# Patient Record
Sex: Female | Born: 1955 | Race: Black or African American | Hispanic: No | Marital: Married | Smoking: Never smoker
Health system: Southern US, Community
[De-identification: ages and names within clinical notes are randomized; demographics above are authoritative.]

## PROBLEM LIST (undated history)

## (undated) ENCOUNTER — Emergency Department (HOSPITAL_COMMUNITY): Admission: EM | Discharge status: Absent without leave | Payer: Self-pay

## (undated) DIAGNOSIS — F419 Anxiety disorder, unspecified: Secondary | ICD-10-CM

## (undated) DIAGNOSIS — E663 Overweight: Secondary | ICD-10-CM

## (undated) DIAGNOSIS — T7840XA Allergy, unspecified, initial encounter: Secondary | ICD-10-CM

## (undated) DIAGNOSIS — G4733 Obstructive sleep apnea (adult) (pediatric): Secondary | ICD-10-CM

## (undated) DIAGNOSIS — I10 Essential (primary) hypertension: Secondary | ICD-10-CM

## (undated) DIAGNOSIS — R0602 Shortness of breath: Secondary | ICD-10-CM

## (undated) DIAGNOSIS — E669 Obesity, unspecified: Secondary | ICD-10-CM

## (undated) DIAGNOSIS — G4730 Sleep apnea, unspecified: Secondary | ICD-10-CM

## (undated) DIAGNOSIS — R7303 Prediabetes: Secondary | ICD-10-CM

## (undated) DIAGNOSIS — E785 Hyperlipidemia, unspecified: Secondary | ICD-10-CM

## (undated) DIAGNOSIS — D649 Anemia, unspecified: Secondary | ICD-10-CM

## (undated) DIAGNOSIS — B070 Plantar wart: Secondary | ICD-10-CM

## (undated) HISTORY — DX: Sleep apnea, unspecified: G47.30

## (undated) HISTORY — PX: TUBAL LIGATION: SHX77

## (undated) HISTORY — DX: Essential (primary) hypertension: I10

## (undated) HISTORY — DX: Obstructive sleep apnea (adult) (pediatric): G47.33

## (undated) HISTORY — DX: Anemia, unspecified: D64.9

## (undated) HISTORY — DX: Obesity, unspecified: E66.9

## (undated) HISTORY — DX: Overweight: E66.3

## (undated) HISTORY — DX: Hyperlipidemia, unspecified: E78.5

## (undated) HISTORY — DX: Plantar wart: B07.0

## (undated) HISTORY — DX: Allergy, unspecified, initial encounter: T78.40XA

## (undated) HISTORY — DX: Prediabetes: R73.03

## (undated) HISTORY — DX: Shortness of breath: R06.02

## (undated) HISTORY — DX: Anxiety disorder, unspecified: F41.9

---

## 2000-07-11 ENCOUNTER — Encounter: Payer: Self-pay | Admitting: Pulmonary Disease

## 2000-07-11 ENCOUNTER — Ambulatory Visit (HOSPITAL_COMMUNITY): Admission: RE | Admit: 2000-07-11 | Discharge: 2000-07-11 | Payer: Self-pay | Admitting: Pulmonary Disease

## 2000-07-27 ENCOUNTER — Encounter: Admission: RE | Admit: 2000-07-27 | Discharge: 2000-08-18 | Payer: Self-pay | Admitting: Sports Medicine

## 2001-11-05 ENCOUNTER — Other Ambulatory Visit: Admission: RE | Admit: 2001-11-05 | Discharge: 2001-11-05 | Payer: Self-pay | Admitting: Gynecology

## 2003-12-25 ENCOUNTER — Other Ambulatory Visit: Admission: RE | Admit: 2003-12-25 | Discharge: 2003-12-25 | Payer: Self-pay | Admitting: Gynecology

## 2004-01-15 ENCOUNTER — Ambulatory Visit: Payer: Self-pay | Admitting: Pulmonary Disease

## 2004-06-22 ENCOUNTER — Emergency Department (HOSPITAL_COMMUNITY): Admission: EM | Admit: 2004-06-22 | Discharge: 2004-06-22 | Payer: Self-pay | Admitting: Emergency Medicine

## 2004-07-21 ENCOUNTER — Encounter: Admission: RE | Admit: 2004-07-21 | Discharge: 2004-08-11 | Payer: Self-pay | Admitting: Orthopedic Surgery

## 2005-01-27 ENCOUNTER — Other Ambulatory Visit: Admission: RE | Admit: 2005-01-27 | Discharge: 2005-01-27 | Payer: Self-pay | Admitting: Gynecology

## 2005-02-18 ENCOUNTER — Ambulatory Visit: Payer: Self-pay | Admitting: Pulmonary Disease

## 2006-06-09 ENCOUNTER — Emergency Department (HOSPITAL_COMMUNITY): Admission: EM | Admit: 2006-06-09 | Discharge: 2006-06-09 | Payer: Self-pay | Admitting: Emergency Medicine

## 2007-01-11 DIAGNOSIS — J4540 Moderate persistent asthma, uncomplicated: Secondary | ICD-10-CM | POA: Insufficient documentation

## 2007-01-11 DIAGNOSIS — J45909 Unspecified asthma, uncomplicated: Secondary | ICD-10-CM

## 2007-01-12 ENCOUNTER — Ambulatory Visit: Payer: Self-pay | Admitting: Pulmonary Disease

## 2007-01-12 LAB — CONVERTED CEMR LAB
ALT: 36 units/L — ABNORMAL HIGH (ref 0–35)
Albumin: 3.7 g/dL (ref 3.5–5.2)
Basophils Absolute: 0 10*3/uL (ref 0.0–0.1)
Bilirubin Urine: NEGATIVE
Bilirubin, Direct: 0.2 mg/dL (ref 0.0–0.3)
Calcium: 9.1 mg/dL (ref 8.4–10.5)
Chloride: 102 meq/L (ref 96–112)
Cholesterol: 178 mg/dL (ref 0–200)
Eosinophils Absolute: 0.2 10*3/uL (ref 0.0–0.6)
Eosinophils Relative: 3.2 % (ref 0.0–5.0)
GFR calc Af Amer: 98 mL/min
GFR calc non Af Amer: 81 mL/min
Glucose, Bld: 94 mg/dL (ref 70–99)
HDL: 58.2 mg/dL (ref >39.0)
Hemoglobin, Urine: NEGATIVE
Ketones, ur: NEGATIVE mg/dL
LDL Cholesterol: 113 mg/dL — ABNORMAL HIGH (ref 0–99)
Lymphocytes Relative: 28.4 % (ref 12.0–46.0)
MCHC: 34.1 g/dL (ref 30.0–36.0)
MCV: 87.7 fL (ref 78.0–100.0)
Monocytes Relative: 6.5 % (ref 3.0–11.0)
Neutro Abs: 4.7 10*3/uL (ref 1.4–7.7)
Nitrite: NEGATIVE
Platelets: 287 10*3/uL (ref 150–400)
RBC: 4.61 M/uL (ref 3.87–5.11)
TSH: 1.9 microintl units/mL (ref 0.35–5.50)
Total CHOL/HDL Ratio: 3.1
Triglycerides: 35 mg/dL (ref 0–149)
Urine Glucose: NEGATIVE mg/dL
WBC: 7.5 10*3/uL (ref 4.5–10.5)

## 2007-04-03 ENCOUNTER — Ambulatory Visit: Payer: Self-pay | Admitting: Pulmonary Disease

## 2007-04-10 ENCOUNTER — Telehealth (INDEPENDENT_AMBULATORY_CARE_PROVIDER_SITE_OTHER): Payer: Self-pay | Admitting: *Deleted

## 2007-06-14 ENCOUNTER — Other Ambulatory Visit: Admission: RE | Admit: 2007-06-14 | Discharge: 2007-06-14 | Payer: Self-pay | Admitting: Gynecology

## 2007-06-22 ENCOUNTER — Telehealth: Payer: Self-pay | Admitting: Pulmonary Disease

## 2007-06-27 ENCOUNTER — Telehealth (INDEPENDENT_AMBULATORY_CARE_PROVIDER_SITE_OTHER): Payer: Self-pay | Admitting: *Deleted

## 2007-07-27 ENCOUNTER — Ambulatory Visit: Payer: Self-pay | Admitting: Pulmonary Disease

## 2007-07-27 DIAGNOSIS — J309 Allergic rhinitis, unspecified: Secondary | ICD-10-CM | POA: Insufficient documentation

## 2007-07-27 DIAGNOSIS — E785 Hyperlipidemia, unspecified: Secondary | ICD-10-CM | POA: Insufficient documentation

## 2007-07-27 DIAGNOSIS — F411 Generalized anxiety disorder: Secondary | ICD-10-CM | POA: Insufficient documentation

## 2007-09-21 ENCOUNTER — Encounter: Payer: Self-pay | Admitting: Pulmonary Disease

## 2007-10-07 ENCOUNTER — Emergency Department (HOSPITAL_COMMUNITY): Admission: EM | Admit: 2007-10-07 | Discharge: 2007-10-07 | Payer: Self-pay | Admitting: Emergency Medicine

## 2007-11-22 ENCOUNTER — Encounter: Payer: Self-pay | Admitting: Pulmonary Disease

## 2007-11-23 ENCOUNTER — Telehealth: Payer: Self-pay | Admitting: Pulmonary Disease

## 2007-11-27 ENCOUNTER — Telehealth (INDEPENDENT_AMBULATORY_CARE_PROVIDER_SITE_OTHER): Payer: Self-pay | Admitting: *Deleted

## 2007-11-27 ENCOUNTER — Encounter: Payer: Self-pay | Admitting: Pulmonary Disease

## 2007-12-03 ENCOUNTER — Telehealth (INDEPENDENT_AMBULATORY_CARE_PROVIDER_SITE_OTHER): Payer: Self-pay | Admitting: *Deleted

## 2007-12-07 ENCOUNTER — Telehealth (INDEPENDENT_AMBULATORY_CARE_PROVIDER_SITE_OTHER): Payer: Self-pay | Admitting: *Deleted

## 2007-12-07 ENCOUNTER — Telehealth: Payer: Self-pay | Admitting: Pulmonary Disease

## 2007-12-10 ENCOUNTER — Encounter: Payer: Self-pay | Admitting: Pulmonary Disease

## 2008-01-28 ENCOUNTER — Emergency Department (HOSPITAL_COMMUNITY): Admission: EM | Admit: 2008-01-28 | Discharge: 2008-01-28 | Payer: Self-pay | Admitting: Emergency Medicine

## 2008-02-06 ENCOUNTER — Telehealth (INDEPENDENT_AMBULATORY_CARE_PROVIDER_SITE_OTHER): Payer: Self-pay | Admitting: *Deleted

## 2008-02-07 ENCOUNTER — Ambulatory Visit: Payer: Self-pay | Admitting: Pulmonary Disease

## 2008-02-08 HISTORY — PX: COLONOSCOPY: SHX174

## 2008-02-12 LAB — CONVERTED CEMR LAB
Basophils Relative: 0.1 % (ref 0.0–3.0)
HCT: 39.1 % (ref 36.0–46.0)
Hemoglobin: 13.4 g/dL (ref 12.0–15.0)
Lymphocytes Relative: 13.9 % (ref 12.0–46.0)
Monocytes Absolute: 0.4 10*3/uL (ref 0.1–1.0)
Monocytes Relative: 3.3 % (ref 3.0–12.0)
Neutro Abs: 10.3 10*3/uL — ABNORMAL HIGH (ref 1.4–7.7)
RBC: 4.48 M/uL (ref 3.87–5.11)

## 2008-02-15 ENCOUNTER — Ambulatory Visit: Payer: Self-pay | Admitting: Pulmonary Disease

## 2008-02-22 ENCOUNTER — Ambulatory Visit: Payer: Self-pay | Admitting: Pulmonary Disease

## 2008-02-22 LAB — CONVERTED CEMR LAB
Fecal Occult Blood: NEGATIVE
OCCULT 4: POSITIVE — AB
OCCULT 5: NEGATIVE

## 2008-02-27 ENCOUNTER — Encounter: Admission: RE | Admit: 2008-02-27 | Discharge: 2008-05-27 | Payer: Self-pay | Admitting: Occupational Medicine

## 2008-04-30 ENCOUNTER — Ambulatory Visit (HOSPITAL_COMMUNITY): Admission: RE | Admit: 2008-04-30 | Discharge: 2008-04-30 | Payer: Self-pay | Admitting: Internal Medicine

## 2008-05-28 ENCOUNTER — Encounter: Payer: Self-pay | Admitting: Pulmonary Disease

## 2008-06-06 ENCOUNTER — Encounter: Payer: Self-pay | Admitting: Pulmonary Disease

## 2008-08-15 ENCOUNTER — Ambulatory Visit: Payer: Self-pay | Admitting: Gastroenterology

## 2008-08-19 ENCOUNTER — Ambulatory Visit: Payer: Self-pay | Admitting: Gastroenterology

## 2008-08-19 ENCOUNTER — Telehealth: Payer: Self-pay | Admitting: Gastroenterology

## 2008-08-19 LAB — CONVERTED CEMR LAB
Basophils Relative: 0.1 % (ref 0.0–3.0)
Eosinophils Absolute: 0.2 10*3/uL (ref 0.0–0.7)
Eosinophils Relative: 2.1 % (ref 0.0–5.0)
Lymphocytes Relative: 24.9 % (ref 12.0–46.0)
Monocytes Relative: 3.8 % (ref 3.0–12.0)
Neutrophils Relative %: 69.1 % (ref 43.0–77.0)
RBC: 4.55 M/uL (ref 3.87–5.11)
WBC: 7.5 10*3/uL (ref 4.5–10.5)

## 2008-11-28 ENCOUNTER — Encounter: Payer: Self-pay | Admitting: Pulmonary Disease

## 2008-12-11 ENCOUNTER — Ambulatory Visit: Payer: Self-pay | Admitting: Pulmonary Disease

## 2009-01-27 ENCOUNTER — Telehealth (INDEPENDENT_AMBULATORY_CARE_PROVIDER_SITE_OTHER): Payer: Self-pay | Admitting: *Deleted

## 2009-02-02 LAB — CONVERTED CEMR LAB
ALT: 14 units/L (ref 0–35)
AST: 18 units/L (ref 0–37)
Albumin: 3.8 g/dL (ref 3.5–5.2)
CO2: 29 meq/L (ref 19–32)
Eosinophils Relative: 1.8 % (ref 0.0–5.0)
HCT: 42.3 % (ref 36.0–46.0)
HDL: 57.1 mg/dL (ref >39.00)
Hemoglobin: 14.3 g/dL (ref 12.0–15.0)
LDL Cholesterol: 102 mg/dL — ABNORMAL HIGH (ref 0–99)
Lymphs Abs: 2.1 10*3/uL (ref 0.7–4.0)
MCV: 90 fL (ref 78.0–100.0)
Monocytes Absolute: 0.4 10*3/uL (ref 0.1–1.0)
Monocytes Relative: 5.7 % (ref 3.0–12.0)
Neutro Abs: 3.7 10*3/uL (ref 1.4–7.7)
Platelets: 260 10*3/uL (ref 150.0–400.0)
RDW: 13.2 % (ref 11.5–14.6)
TSH: 1.67 microintl units/mL (ref 0.35–5.50)
Total Bilirubin: 0.9 mg/dL (ref 0.3–1.2)
Total CHOL/HDL Ratio: 3
Triglycerides: 45 mg/dL (ref 0.0–149.0)
VLDL: 9 mg/dL (ref 0.0–40.0)
WBC: 6.4 10*3/uL (ref 4.5–10.5)

## 2009-02-11 ENCOUNTER — Telehealth: Payer: Self-pay | Admitting: Pulmonary Disease

## 2009-05-21 ENCOUNTER — Ambulatory Visit: Payer: Self-pay | Admitting: Pulmonary Disease

## 2009-05-21 DIAGNOSIS — I10 Essential (primary) hypertension: Secondary | ICD-10-CM | POA: Insufficient documentation

## 2009-05-21 LAB — CONVERTED CEMR LAB
CO2: 29 meq/L (ref 19–32)
Calcium: 8.7 mg/dL (ref 8.4–10.5)
Creatinine, Ser: 0.6 mg/dL (ref 0.4–1.2)
GFR calc non Af Amer: 134.75 mL/min (ref >60)
Sodium: 142 meq/L (ref 135–145)

## 2009-06-04 ENCOUNTER — Ambulatory Visit: Payer: Self-pay | Admitting: Pulmonary Disease

## 2009-11-27 ENCOUNTER — Encounter: Payer: Self-pay | Admitting: Pulmonary Disease

## 2009-11-27 ENCOUNTER — Ambulatory Visit: Payer: Self-pay | Admitting: Pulmonary Disease

## 2009-11-29 LAB — CONVERTED CEMR LAB
ALT: 14 units/L (ref 0–35)
AST: 19 units/L (ref 0–37)
Alkaline Phosphatase: 99 units/L (ref 39–117)
BUN: 11 mg/dL (ref 6–23)
Bilirubin, Direct: 0.1 mg/dL (ref 0.0–0.3)
Chloride: 105 meq/L (ref 96–112)
Cholesterol: 185 mg/dL (ref 0–200)
Eosinophils Absolute: 0.2 10*3/uL (ref 0.0–0.7)
Eosinophils Relative: 2.5 % (ref 0.0–5.0)
GFR calc non Af Amer: 110.74 mL/min (ref >60)
HCT: 43.7 % (ref 36.0–46.0)
LDL Cholesterol: 118 mg/dL — ABNORMAL HIGH (ref 0–99)
Lymphs Abs: 2.3 10*3/uL (ref 0.7–4.0)
MCHC: 33.3 g/dL (ref 30.0–36.0)
MCV: 87.8 fL (ref 78.0–100.0)
Monocytes Absolute: 0.5 10*3/uL (ref 0.1–1.0)
Neutrophils Relative %: 61.5 % (ref 43.0–77.0)
Platelets: 292 10*3/uL (ref 150.0–400.0)
Potassium: 4.2 meq/L (ref 3.5–5.1)
Sodium: 141 meq/L (ref 135–145)
Total Bilirubin: 0.7 mg/dL (ref 0.3–1.2)
Total CHOL/HDL Ratio: 3
Vitamin B-12: 1398 pg/mL — ABNORMAL HIGH (ref 211–911)
WBC: 7.9 10*3/uL (ref 4.5–10.5)

## 2009-11-30 ENCOUNTER — Encounter: Payer: Self-pay | Admitting: Pulmonary Disease

## 2009-12-09 ENCOUNTER — Telehealth: Payer: Self-pay | Admitting: Pulmonary Disease

## 2010-03-05 ENCOUNTER — Ambulatory Visit
Admission: RE | Admit: 2010-03-05 | Discharge: 2010-03-05 | Payer: Self-pay | Source: Home / Self Care | Attending: Pulmonary Disease | Admitting: Pulmonary Disease

## 2010-03-09 NOTE — Procedures (Signed)
Summary: Colonoscopy   Colonoscopy  Procedure date:  08/19/2008  Findings:      Location:  [facility].    Procedures Next Due Date:    Colonoscopy: 08/2018 COLONOSCOPY PROCEDURE REPORT  PATIENT:  Brenda Russo, Brenda Russo  MR#:  [id number] BIRTHDATE:   Feb 17, 1955, 52 yrs. old   GENDER:   female  ENDOSCOPIST:   Rachael Fee, MD Referred by: Alroy Dust, M.D.  PROCEDURE DATE:  08/19/2008 PROCEDURE:  Colonoscopy, diagnostic ASA CLASS:   Class II Indications: recent mild rectal bleeding, otherwise routine risk for colon cancer    MEDICATIONS:    Fentanyl 100 mcg IV, Versed 10 mg IV  DESCRIPTION OF PROCEDURE:   After the risks benefits and alternatives of the procedure were thoroughly explained, informed consent was obtained.  Digital rectal exam was performed and revealed no rectal masses.   The LB [id number] endoscope was introduced through the anus and advanced to the cecum, which was identified by both the appendix and ileocecal valve, without limitations.  The quality of the prep was excellent, using MoviPrep.  The instrument was then slowly withdrawn as the colon was fully examined. <<PROCEDUREIMAGES>>    <<OLD IMAGES>> FINDINGS:  A normal appearing cecum, ileocecal valve, and appendiceal orifice were identified. The ascending, hepatic flexure, transverse, splenic flexure, descending, sigmoid colon, and rectum appeared unremarkable (see image1, image2, and image3).   Retroflexed views in the rectum revealed no abnormalities.    The scope was then withdrawn from the patient and the procedure completed. COMPLICATIONS:   None  ENDOSCOPIC IMPRESSION:  1) Normal colon  2) No polyps or cancers RECOMMENDATIONS:  1) You should continue follow current colorectal cancer screening guidelines for "routine risk" patients with a repeat colonoscopy in 10 years. I do not recommend other colon cancer screening prior to then (including stool tests for microscopic blood)  unless new symptoms arise.     REPEAT EXAM:   10 years   _______________________________ Rachael Fee, MD

## 2010-03-09 NOTE — Assessment & Plan Note (Signed)
Summary: PHYSICAL ///KP   Primary Care Provider:  Alroy Dust  CC:  1 year ROV & CPX....  History of Present Illness: 55 y/o BF here for a follow up visit...  he has multiple medical problems as noted below...     ~  December 11, 2008:  states her breathing has been stable w/ Prn Albut neb & Ventolin HFA... no asthma exac etc... she wants to discuss wt loss programs and asks about HCG diet plan suggested by her GYN... we discussed diet programs in general- calories in, calories out, & gimmicks... she will consider weight watchers & exercise program to get in shape...   ~  November 27, 2009:  she had some transient HBP last spring- saw TP & started Benicar but she doesn't want to have to take meds & stopped on her own... checking BP's at home & states they are OK w/ high 140/90 range- unfortunately BP 170/100+ today in office & she clearly needs BP meds- we discussed this & she has agreed to take generic Hyzaar... she denies CP, palpit, SOB, edema, etc... states breathing is good & still only using the Prn Albuterol inhaler (even though she uses it pretty much every day)- still declines to use controller med like Symbicort (higher co-pay)... CXR is WNL, and Labs are norm, she requested B12 level= 1398... wants letter exempting her from Flu shot due to allergy to eggs.    Current Problem List:  ALLERGIC RHINITIS (ICD-477.9) - she uses OTC meds as needed.  ASTHMA (ICD-493.90) - only using Ventolin HFA as needed (she has NEB w/ Albuterol as well)... she hasn't been checking peak flows etc... she notes that she averages one inhalation daily- usually in the AM... she understands that she is not on a controller drug, but she does not wish to change to Symbicort, Advair, Pulmicort, etc (despite my recommendation to start and stay on one of these medications)...   ESSENTIAL HYPERTENSION (ICD-401.9) - we discussed starting LOSARTAN/Hct 100-12.5 daily... BP today = 170/100 on diet alone & we discussed no  salt & weight reduction... denies HA, fatigue, visual changes, CP, palipit, dizziness, syncope, dyspnea, edema, etc...   HYPERCHOLESTEROLEMIA, BORDERLINE (ICD-272.4) - on diet Rx alone...   ~  FLP 12/08 showed TChol 178, TG 35, HDL 58, LDL 113  ~  FLP 11/10 showed TChol 168, TG 45, HDL 57, LDL 102  ~  FLP 10/11 showed TChol 185, TG 49, HDL 57, LDL 118  OVERWEIGHT (ICD-278.02) - we discussed diet + exercise program...  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 had colonoscopy 7/10 by DrJacobs= neg, no polyps etc... f/u plannned 48yrs.   Preventive Screening-Counseling & Management  Alcohol-Tobacco     Smoking Status: never  Allergies (verified): No Known Drug Allergies  Comments:  Nurse/Medical Assistant: The patient's medications and allergies were reviewed with the patient and were updated in the Medication and Allergy Lists.  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 brother deceased age 45 with pancreatic cancer  1 sibling alive age 31 breast cancer in aunt mother alive age 35 hx of heart disease father deceased age 12 from heart failure-on vent hx of asthma 1 brother deceased age 28 from MVA  Social History: Reviewed history from 12/11/2008 and no changes required. 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  The patient denies fever, chills, sweats, anorexia, fatigue, weakness, malaise, weight loss, sleep disorder, blurring, diplopia, eye irritation, eye discharge, vision loss, eye pain, photophobia, earache, ear discharge, tinnitus, decreased hearing, nosebleeds, sore throat, hoarseness, chest pain, palpitations, syncope, orthopnea, PND, peripheral edema, cough, dyspnea at rest, excessive sputum, hemoptysis, wheezing, pleurisy, nausea, vomiting, diarrhea, constipation, change in bowel habits, abdominal pain, melena, hematochezia, jaundice, gas/bloating, indigestion/heartburn, dysphagia, odynophagia, dysuria, hematuria, urinary frequency, urinary hesitancy, nocturia, incontinence, back pain, joint pain, joint swelling, muscle cramps, muscle weakness, stiffness, arthritis, sciatica, restless legs, leg pain at night, leg pain with exertion, rash, itching, dryness, suspicious lesions, paralysis, paresthesias, seizures, tremors, vertigo, transient blindness, frequent falls, frequent headaches, difficulty walking, depression, memory loss, confusion, cold intolerance, heat intolerance, polydipsia, polyphagia, polyuria, unusual weight change, abnormal bruising, bleeding, enlarged lymph nodes, urticaria, allergic rash, and recurrent infections.    Vital Signs:  Patient profile:   54 year old female Height:      65 inches Weight:      213.13 pounds BMI:     35.59 O2 Sat:      98 % on Room air Temp:     97.8 degrees F oral Pulse rate:   83 / minute BP sitting:   170 / 100  (left arm) Cuff size:   large  Vitals Entered By: Randell Loop CMA (November 27, 2009 11:29 AM)  O2 Sat at Rest %:  98 O2 Flow:  Room air CC: 1 year ROV & CPX... Is Patient Diabetic? No Pain  Assessment Patient in pain? no      Comments meds updated today with pt   Physical Exam  Additional Exam:  WD, WN, 54 y/o BF in NAD... GENERAL:  Alert & oriented; pleasant & cooperative... HEENT:  [place]/AT, EOM-wnl, PERRLA, EACs-clear, TMs-wnl, NOSE-clear, THROAT-clear & wnl. NECK:  Supple w/ full ROM; no JVD; normal carotid impulses w/o bruits; no thyromegaly or nodules palpated; no lymphadenopathy. CHEST:  Clear to P & A; without wheezes/ rales/ or rhonchi heard... HEART:  Regular Rhythm; without murmurs/ rubs/ or gallops detected... ABDOMEN:  Soft & nontender; normal bowel sounds; no organomegaly or masses palpated... EXT: without deformities or arthritic changes; no varicose veins/ +venous insuffic/ no edema. NEURO:  CN's intact; motor testing normal; sensory testing normal; gait normal & balance OK. DERM:  No lesions noted; no rash etc...    CXR  Procedure date:  11/27/2009  Findings:      CHEST - 2 VIEW Comparison: 01/12/2007   FINDINGS:  The lungs are clear bilaterally.  No confluent airspace opacities, pleural effuions or pneumothoracies are seen.  The heart is normal in size and contour.  The upper abdomen and osseous structures are normal.   IMPRESSION: No acute cardiopulmonary disease.   Read By:  Henrene Pastor,  M.D.   EKG  Procedure date:  11/27/2009  Findings:      Sinus bradycardia with rate of:  56/ min... Tracing shows NSSTTWA, NAD...  SN   MISC. Report  Procedure date:  11/27/2009  Findings:      BMP (METABOL)   Sodium                    141 mEq/L                   135-145   Potassium                 4.2 mEq/L                   3.5-5.1   Chloride                  105 mEq/L                   96-112   Carbon Dioxide            29 mEq/L                    19-32   Glucose                   89 mg/dL                    16-10   BUN                       11 mg/dL                    9-60   Creatinine                0.7 mg/dL                    4.5-4.0   Calcium                   9.1 mg/dL                   9.8-11.9   GFR                       110.74 mL/min               >60  Hepatic/Liver Function Panel (HEPATIC)   Total Bilirubin           0.7 mg/dL                   1.4-7.8   Direct Bilirubin          0.1 mg/dL                   2.9-5.6   Alkaline Phosphatase      99 U/L                      39-117   AST                       19 U/L                      0-37   ALT  14 U/L                      0-35   Total Protein             7.4 g/dL                    1.1-9.1   Albumin                   3.7 g/dL                    4.7-8.2  CBC Platelet w/Diff (CBCD)   White Cell Count          7.9 K/uL                    4.5-10.5   Red Cell Count            4.97 Mil/uL                 3.87-5.11   Hemoglobin                14.6 g/dL                   95.6-21.3   Hematocrit                43.7 %                      36.0-46.0   MCV                       87.8 fl                     78.0-100.0   Platelet Count            292.0 K/uL                  150.0-400.0   Neutrophil %              61.5 %                      43.0-77.0   Lymphocyte %              29.0 %                      12.0-46.0   Monocyte %                6.7 %                       3.0-12.0   Eosinophils%              2.5 %                       0.0-5.0   Basophils %               0.3 %                       0.0-3.0  Comments:      Lipid Panel (LIPID)   Cholesterol               185 mg/dL  0-200   Triglycerides             49.0 mg/dL                  1.6-109.6   HDL                       04.54 mg/dL                 >09.81   LDL Cholesterol      [H]  191 mg/dL                   4-78   TSH (TSH)   FastTSH                   1.45 uIU/mL                 0.35-5.50   B12 Serum - Total ONLY (B12)   Vitamin B12          [H]  1398 pg/mL                  211-911    Impression & Recommendations:  Problem # 1:  PHYSICAL EXAMINATION  (ICD-V70.0) He 2 main issues are her BP and her Airway dis> see discussions below... Orders: EKG w/ Interpretation (93000) T-2 View CXR (71020TC) TLB-BMP (Basic Metabolic Panel-BMET) (80048-METABOL) TLB-Hepatic/Liver Function Pnl (80076-HEPATIC) TLB-CBC Platelet - w/Differential (85025-CBCD) TLB-Lipid Panel (80061-LIPID) TLB-TSH (Thyroid Stimulating Hormone) (84443-TSH) TLB-B12, Serum-Total ONLY (29562-Z30)  Problem # 2:  ASTHMA (ICD-493.90) She tells me that she is using the Albut inhaler every day & uses the NEBs once per month... I explained how asthmatics shouldn't have to use the rescue inhaler but once or twice per month!  The obvious solution would be to switch to Symbicort for daily use & the Ventolin back to just as needed but she declines due to cost, etc...  Her updated medication list for this problem includes:    Albuterol Sulfate (2.5 Mg/68ml) 0.083% Nebu (Albuterol sulfate) ..... Use in nebulizer up to 3 times daily as needed    Ventolin Hfa 108 (90 Base) Mcg/act Aers (Albuterol sulfate) .Marland Kitchen... 1-2 puffs q 4 h as needed for wheezing...  Problem # 3:  ESSENTIAL HYPERTENSION (ICD-401.9) We discussed her BP & the need for good control throughout the days... rec starting Hyzaar 100-12.5 daily, monitor BP at home & f/u here in 3-4 months... The following medications were removed from the medication list:    Benicar 40 Mg Tabs (Olmesartan medoxomil) .Marland Kitchen... 1 by mouth once daily Her updated medication list for this problem includes:    Losartan Potassium-hctz 100-12.5 Mg Tabs (Losartan potassium-hctz) .Marland Kitchen... Take 1 tab by mouth once daily...  Problem # 4:  HYPERCHOLESTEROLEMIA, BORDERLINE (ICD-272.4) Her LDL is 118 but she is still overwt & hasn't lost anything yet... discussed diet, exercise, get wt down...  Problem # 5:  OTHER MEDICAL ISSUES AS NOTED>>> She states that she is allergic to eggs & can't get the Flu vaccine...  Complete Medication List: 1)  Albuterol Sulfate (2.5  Mg/60ml) 0.083% Nebu (Albuterol sulfate) .... Use in nebulizer up to 3 times daily as needed 2)  Ventolin Hfa 108 (90 Base) Mcg/act Aers (Albuterol sulfate) .Marland Kitchen.. 1-2 puffs q 4 h as needed for wheezing... 3)  Losartan Potassium-hctz 100-12.5 Mg Tabs (Losartan potassium-hctz) .... Take 1 tab by mouth once daily...  Patient Instructions: 1)  Today we updated your med list-  see below.... 2)  We refilled your Ventolin HFA & wrote a new perscription for Losartan/Hct for your BP.Marland KitchenMarland Kitchen 3)  Today we did your follow up CXR, EKG, and FASTING blood work... please call the "phone tree" in a few days for your lab results.Marland KitchenMarland Kitchen 4)  Let's get on track w/ our diet & exercise program... the goal is to lose 15-20 lbs over the next several months... 5)  Call for any problems.Marland KitchenMarland Kitchen 6)  Let's plan a follow up visit in 3-4 months to recheck your BP & monitor your progress... Prescriptions: LOSARTAN POTASSIUM-HCTZ 100-12.5 MG TABS (LOSARTAN POTASSIUM-HCTZ) take 1 tab by mouth once daily...  #90 x 4   Entered and Authorized by:   Michele Mcalpine MD   Signed by:   Michele Mcalpine MD on 11/27/2009   Method used:   Print then Give to Patient   RxID:   [id number] VENTOLIN HFA 108 (90 BASE) MCG/ACT  AERS (ALBUTEROL SULFATE) 1-2 puffs Q 4 H as needed for wheezing...  #3 inhalers x 4   Entered and Authorized by:   Michele Mcalpine MD   Signed by:   Michele Mcalpine MD on 11/27/2009   Method used:   Print then Give to Patient   RxID:   [id number]

## 2010-03-09 NOTE — Letter (Signed)
Summary: [facility]  [address]   [place], [place] [address]   Phone: [phone]  Fax: [phone]    11/27/2009     To Whom It May Concern ,    Brenda Russo is a patient of mine that is followed for general medical purposes.  She has an allergy to eggs and is not able to take the flu vaccine due to this allergy.   If you have any further questions please feel free to contact my office.      Sincerely,       Lonzo Cloud. Elayne Snare. D.

## 2010-03-09 NOTE — Progress Notes (Signed)
Summary: prescript  Phone Note Call from Patient   Caller: Patient Call For: Sherika Kubicki Summary of Call: need dentolin hfa 108 inhaler refill  for 3 month supply faxed to [facility] Initial call taken by: Rickard Patience,  February 11, 2009 8:30 AM  Follow-up for Phone Call        rx sent. pt aware.  Carron Curie CMA  February 11, 2009 9:10 AM     Prescriptions: VENTOLIN HFA 108 (90 BASE) MCG/ACT  AERS (ALBUTEROL SULFATE) 1-2 puffs Q 4 H as needed for wheezing...  #3 x 3   Entered by:   Carron Curie CMA   Authorized by:   Michele Mcalpine MD   Signed by:   Carron Curie CMA on 02/11/2009   Method used:   Electronically to        [facility]* (mail-order)             ,          Ph: [phone]       Fax: [phone]   RxID:   [id number]

## 2010-03-09 NOTE — Assessment & Plan Note (Signed)
Summary: NP follow up - HTN   Copy to:  n/a Primary Provider/Referring Provider:  Alroy Dust  CC:  2 week follow up HTN and states BP has steadily been coming down but still under some stress.  History of Present Illness: 55 y/o BF with known history of hyperlipidemia, and Asthma.   6/09--here for a follow up visit... she has been doing well and denies any asthma exaserbations etc... she has no new complaints or concerns today...  February 07, 2008 --Presents for blood in stool x 2 days. Pt fell on ice on 01/28/08 coming to work. Rx hydrocodone, pred taper and rx strength of ibuprofen.  Saw bright red blood with BM,  last 2 days. -- REC: prednisone/ibuprofen stopped. rx nexium/stool softner and hem suppos.   February 22, 2008 --returns for follow up. Bleeding has stopped, no abdominal pain. "feel much better". Right arm is better, but still weak. getting ready to go to rehab. Stool cards positive. No previous colonoscopy. Denies chest pain, dyspnea, orthopnea, hemoptysis, fever, n/v/d, edema.     ~  December 17, 2008:  states her breathing has been stable w/ Prn Albut neb & Ventolin HFA... no asthma exas etc... she wants to discuss wt loss programs and asks about HCG diet plan suggested by her GYN... we discussed diet programs in general- calories in, calories out, & gimmicks... she will consider weight warchers & exercise program to get in shape...  May 21, 2009 --Presents for an acute office visit. Complains of elevated BP. Under alot of stress.  2 weeks ago b/p 144/78, and  yesterday BP was 179/102 @ pharmacy.Marland Kitchen Has been having intermittent headaches -dull mild in am or at night, frontal, took tylenol or aleve helped. Once she  was relaxed this helped as well. Intermittent chest pressure driving to [place] w/ neice had stroke. Weight is tr up.    Marland KitchenApril 28, 2011 1--Presents for a 2 week follow up HTN, states BP has steadily been coming down but still under some stress. She is in school,  for deaf advocacy. She is feeling good. Denies chest pain, dyspnea, orthopnea, hemoptysis, fever, n/v/d, edema, headache.     Medications Prior to Update: 1)  Albuterol Sulfate (2.5 Mg/11ml) 0.083%  Nebu (Albuterol Sulfate) .... Use in Nebulizer Up To 3 Times Daily As Needed 2)  Ventolin Hfa 108 (90 Base) Mcg/act  Aers (Albuterol Sulfate) .Marland Kitchen.. 1-2 Puffs Q 4 H As Needed For Wheezing... 3)  Alprazolam 0.25 Mg  Tabs (Alprazolam) .... Take 1/2 Tab By Mouth Three Times A Day As Needed For Nerves...  Current Medications (verified): 1)  Albuterol Sulfate (2.5 Mg/67ml) 0.083%  Nebu (Albuterol Sulfate) .... Use in Nebulizer Up To 3 Times Daily As Needed 2)  Ventolin Hfa 108 (90 Base) Mcg/act  Aers (Albuterol Sulfate) .Marland Kitchen.. 1-2 Puffs Q 4 H As Needed For Wheezing... 3)  Alprazolam 0.25 Mg  Tabs (Alprazolam) .... Take 1/2 Tab By Mouth Three Times A Day As Needed For Nerves...  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  ASTHMA (ICD-493.90) - only using Ventolin HFA as needed (& has NEB w/ Albuterol as well).. she declines Symbicort, Advair, Pulmicort, etc   HYPERCHOLESTEROLEMIA, BORDERLINE (ICD-272.4) - on diet Rx alone...   ~  FLP 12/08 showed TChol 178, TG 35, HDL 58, LDL 113...   OVERWEIGHT (ICD-278.02)  - we discussed diet + exercise program...  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  GENERAL HEALTH:  she sees DrMezer for GYN & she says up to date on PAP, Mammograms, etc... had colonoscopy 7/10 by DrJacobs= neg, no polyps etc... f/u plannned 42yrs.       Review of Systems      See HPI  Vital Signs:  Patient profile:   55 year old female Height:      65 inches Weight:      218.38 pounds BMI:     36.47 O2 Sat:      94 % on Room air Temp:     98.0 degrees F oral Pulse rate:   66 / minute BP sitting:   128 / 80  (left arm) Cuff size:   large  Vitals Entered By: Boone Master CNA (June 04, 2009 9:56 AM)  O2 Flow:  Room air CC: 2 week follow up HTN, states BP has steadily been coming down but still under some stress Is Patient Diabetic? No Comments Medications reviewed with patient Daytime contact number verified with patient. Boone Master CNA  June 04, 2009 9:57 AM    Physical Exam  Additional Exam:  WD, WN, 55 y/o BF in NAD... GENERAL:  Alert & oriented; pleasant & cooperative... HEENT:  [place]/AT, EOM-wnl, PERRLA, EACs-clear, TMs-wnl, NOSE-clear, THROAT-clear & wnl. NECK:  Supple w/ full ROM; no JVD; normal carotid impulses w/o bruits; no thyromegaly or nodules palpated; no lymphadenopathy. CHEST:  Clear to P & A; without wheezes/ rales/ or rhonchi heard... HEART:  Regular Rhythm; without murmurs/ rubs/ or gallops detected... ABDOMEN:  Soft & nontender; normal bowel sounds; no organomegaly or masses palpated... EXT: without deformities or arthritic changes; no varicose veins/ +venous insuffic/ no edema. NEURO:  CN's intact; motor testing normal; sensory testing normal; gait normal & balance OK. DERM:  No lesions noted; no rash etc...      Impression & Recommendations:  Problem # 1:  ESSENTIAL HYPERTENSION (ICD-401.9)  Improved  control on Benicar  cont on low salt diet.  weight loss.  labs reviewed, bmet nml.  Her updated medication list for this problem includes:    Benicar 40 Mg Tabs (Olmesartan medoxomil) .Marland Kitchen... 1 by mouth once daily  Orders: Est. Patient Level II (13086)  Medications Added to Medication List This Visit: 1)  Benicar 40 Mg Tabs (Olmesartan medoxomil) .Marland Kitchen.. 1 by mouth once daily  Complete Medication List: 1)  Albuterol Sulfate (2.5 Mg/7ml) 0.083% Nebu (Albuterol sulfate) .... Use in nebulizer up to 3 times daily as needed 2)  Ventolin Hfa 108 (90 Base) Mcg/act Aers (Albuterol sulfate) .Marland Kitchen.. 1-2 puffs q 4 h as needed for wheezing... 3)  Alprazolam 0.25 Mg Tabs (Alprazolam) .... Take 1/2 tab by mouth three times a day as needed for nerves.Marland KitchenMarland Kitchen 4)  Benicar 40 Mg Tabs (Olmesartan medoxomil) .Marland Kitchen.. 1 by mouth once daily  Patient Instructions: 1)  Continue on  Benicar 40mg  once daily  2)  Low salt diet  3)   follow up Dr. Kriste Basque in 3-4 months  4)  Please contact office for sooner follow up if symptoms do not improve or worsen  Prescriptions: BENICAR 40 MG TABS (OLMESARTAN MEDOXOMIL) 1 by mouth once daily  #30 x 5   Entered and Authorized by:   Rubye Oaks NP   Signed by:   Rubye Oaks NP on 06/04/2009   Method used:   Electronically to        [id number] (retail)       [address] [place], [place]  [address]       Ph: [phone]       Fax: [phone]   RxID:   [id number]

## 2010-03-09 NOTE — Progress Notes (Signed)
Summary: medication issues  Phone Note Call from Patient Call back at [phone]   Caller: Patient Call For: nadel Summary of Call: Pt states she was given samples of benicar hct 40mg  last week and it doesn't seem to be working, says her pressure is increasing instead of decreasing, pls advise.//[facility] Initial call taken by: Darletta Moll,  December 09, 2009 9:24 AM  Follow-up for Phone Call        Pt states blood pressure is running 153/98 and she is having a slight headache. When asked what medication she was taking pt states she was given samples of benicar 40mg  and that is what she is on, but according to the last OV note pt was to stop Benicar and start Hyzaar HCTZ. Pt states she has not started this med because she was given samples of benicar. Please advise what med pt is supposed to be taking. Thanks. Carron Curie CMA  December 09, 2009 10:35 AM   Additional Follow-up for Phone Call Additional follow up Details #1::        called and spoke with pt and she is aware per SN to start on the losartan/hctz as directed and call us back in a few days to let us know how she is doing Randell Loop CMA  December 09, 2009 12:42 PM

## 2010-03-09 NOTE — Assessment & Plan Note (Signed)
Summary: Acute NP office visit - HTN   Copy to:  n/a Primary Provider/Referring Provider:  Alroy [facility]so  CC:  elevated b/p .  History of Present Illness: 55 y/o BF with known history of hyperlipidemia, and Asthma.   6/09--here for a follow up visit... she has been doing well and denies any asthma exaserbations etc... she has no new complaints or concerns today...  February 07, 2008 --Presents for blood in stool x 2 days. Pt fell on ice on 01/28/08 coming to work. Rx hydrocodone, pred taper and rx strength of ibuprofen.  Saw bright red blood with BM,  last 2 days. -- REC: prednisone/ibuprofen stopped. rx nexium/stool softner and hem suppos.   February 22, 2008 --returns for follow up. Bleeding has stopped, no abdominal pain. "feel much better". Right arm is better, but still weak. getting ready to go to [facility]. Stool cards positive. No previous colonoscopy. Denies chest pain, dyspnea, orthopnea, hemoptysis, fever, n/v/d, edema.     ~  12-30-08:  states her breathing has been stable w/ Prn Albut neb & [facility]... no asthma exas etc... she wants to discuss wt loss programs and asks about HCG diet plan suggested by her GYN... we discussed diet programs in general- calories in, calories out, & gimmicks... she will consider weight warchers & exercise program to get in shape...  May 21, 2009 --Presents for an acute office visit. Complains of elevated BP. Under alot of stress.  2 weeks ago b/p 144/78, and  yesterday BP was 179/102 @ pharmacy.Marland Kitchen Has been having intermittent headaches -dull mild in am or at night, frontal, took tylenol or aleve helped. Once she  was relaxed this helped as well. Intermittent chest pressure driving to [place] w/ neice had stroke. Weight is tr up. Denies chest pain, dyspnea, orthopnea, hemoptysis, fever, n/v/d, edema, headache, visual/speech changes.       Medications Prior to Update: 1)  [facility] Sulfate (2.5 Mg/76ml) 0.083%  Nebu ([facility] Sulfate) ....  Use in Nebulizer Up To 3 Times Daily As Directed... 2)  [facility] 108 (90 Base) Mcg/act  Aers ([facility] Sulfate) .Marland Kitchen.. 1-2 Puffs Q 4 H As Needed For Wheezing... 3)  Alprazolam 0.25 Mg  Tabs (Alprazolam) .... Take 1/2 Tab By Mouth Three Times A Day As Needed For Nerves... 4)  Medrol (Pak) 4 Mg Tabs (Methylprednisolone) .... Take As Directed. 5)  Avelox 400 Mg Tabs (Moxifloxacin Hcl) .Marland Kitchen.. 1 Tab By Mouth Once Daily  Current Medications (verified): 1)  [facility] Sulfate (2.5 Mg/81ml) 0.083%  Nebu ([facility] Sulfate) .... Use in Nebulizer Up To 3 Times Daily As Directed... 2)  [facility] 108 (90 Base) Mcg/act  Aers ([facility] Sulfate) .Marland Kitchen.. 1-2 Puffs Q 4 H As Needed For Wheezing... 3)  Alprazolam 0.25 Mg  Tabs (Alprazolam) .... Take 1/2 Tab By Mouth Three Times A Day As Needed For Nerves...  Allergies (verified): No Known Drug Allergies  Past History:  Past Surgical History: Last updated: Dec 30, 2008 S/P CSections  Family History: Last updated: 12-30-2008 brother deceased age 45 with pancreatic cancer  1 sibling alive age 58 breast cancer in aunt mother alive age 3 hx of heart disease father deceased age 94 from heart failure-on vent hx of asthma 1 brother deceased age 72 from MVA  Social History: Last updated: December 30, 2008 non smoker exposed to second hand smoke at times exercises 1-2 times per week no caffeine use no alcohol use married 3 biological children 1 adpoted child 3 step-children Employ: [employer] [facility]- [employer]  Risk Factors: Smoking Status: never (01/11/2007)  Past Medical History: ALLERGIC RHINITIS (ICD-477.9) - she uses OTC meds as needed.  ASTHMA (ICD-493.90) - only using [facility] as needed (& has NEB w/ [facility] as well)... she hasn't been checking peak flows etc... she notes that she averages one inhalation daily- usually in the AM... she understands that she is not on a controller drug, but she does not wish to change to Symbicort,  Advair, Pulmicort, etc (despite my recommendation to start and stay on one of these medications)...   HYPERCHOLESTEROLEMIA, BORDERLINE (ICD-272.4) - on diet Rx alone...   ~  FLP 12/08 showed TChol 178, TG 35, HDL 58, LDL 113...   OVERWEIGHT (ICD-278.02) - we discussed diet + exercise program...  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  GENERAL HEALTH:  she sees [facility] for GYN & she says up to date on PAP, Mammograms, etc... had colonoscopy 7/10 by [facility]= neg, no polyps etc... f/u plannned 87yrs.       Review of Systems      See HPI  Vital Signs:  Patient profile:   55 year old female Height:      65 inches Weight:      214.50 pounds BMI:     35.82 O2 Sat:      99 % on Room air Temp:     96.6 degrees F oral Pulse rate:   55 / minute BP sitting:   168 / 104  (left arm) Cuff size:   large  Vitals Entered By: Brenda Master CNA (May 21, 2009 9:18 AM)  O2 Flow:  Room air CC: elevated b/p  Is Patient Diabetic? No Comments Medications reviewed with patient Daytime contact number verified with patient. Brenda Master CNA  May 21, 2009 9:19 AM    Physical Exam  Additional Exam:  WD, WN, 55 y/o BF in NAD... GENERAL:  Alert & oriented; pleasant & cooperative... HEENT:  [place]/AT, EOM-wnl, PERRLA, EACs-clear, TMs-wnl, NOSE-clear, THROAT-clear & wnl. NECK:  Supple w/ full ROM; no JVD; normal carotid impulses w/o bruits; no thyromegaly or nodules palpated; no lymphadenopathy. CHEST:  Clear to P & A; without wheezes/ rales/ or rhonchi heard... HEART:  Regular Rhythm; without murmurs/ rubs/ or gallops detected... ABDOMEN:  Soft & nontender; normal bowel sounds; no organomegaly or masses palpated... EXT: without deformities or arthritic changes; no varicose veins/ +venous insuffic/ no edema. NEURO:  CN's intact; motor testing normal; sensory  testing normal; gait normal & balance OK. DERM:  No lesions noted; no rash etc...  EKG w/ SB w/ no acute changes.    Impression & Recommendations:  Problem # 1:  ESSENTIAL HYPERTENSION (ICD-401.9) B/P is uncontrolled, w/ associated stress and use of Aleve.  labs pending.  REC:  Begin Benicar 40mg  once daily  Low salt diet  I will call with labs.  follow up 2 weeks , bring b/p log.  Call for b/p >160 or <90.  Please contact office for sooner follow up if symptoms do not improve or worsen    Orders: TLB-BMP (Basic Metabolic Panel-BMET) (80048-METABOL) Est. Patient Level IV (16109)  Medications Added to Medication List This Visit: 1)  [facility] Sulfate (2.5 Mg/59ml) 0.083% Nebu ([facility] sulfate) .... Use in nebulizer up to 3 times daily as needed  Complete Medication List: 1)  [facility] Sulfate (  2.5 Mg/23ml) 0.083% Nebu ([facility] sulfate) .... Use in nebulizer up to 3 times daily as needed 2)  [facility] 108 (90 Base) Mcg/act Aers ([facility]da [facility] sulfate) .Marland Kitchen.. 1-2 puffs q 4 h as needed for wheezing... 3)  Alprazolam 0.25 Mg Tabs (Alprazolam) .... Take 1/2 tab by mouth three times a day as needed for nerves...  Patient Instructions: 1)  Begin Benicar 40mg  once daily  2)  Low salt diet  3)  I will call with labs.  4)  follow up 2 weeks , bring b/p log.  5)  Call for b/p >160 or <90.  6)  Please contact office for sooner follow up if symptoms do not improve or worsen    Immunization History:  Influenza Immunization History:    Influenza:  declines (05/21/2009)  Pneumovax Immunization History:    Pneumovax:  declines (05/21/2009)    CardioPerfect ECG  ID: [id number] Patient: [facility], Brenda [facility]so DOB: 16-May-1955 Age: 55 Years Old Sex: Female Race: Black Physician: Ladarren Steiner Technician: Brenda Master CNA Height: 65 Weight: 214.50 Status: Unconfirmed Past Medical History:  ALLERGIC RHINITIS (ICD-477.9) ASTHMA (ICD-493.90) HYPERCHOLESTEROLEMIA, BORDERLINE  (ICD-272.4) OVERWEIGHT (ICD-278.02) 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) ANXIETY (ICD-300.00)   Recorded: 05/21/2009 09:50 AM P/PR: 118 ms / 180 ms - Heart rate (maximum exercise) QRS: 70 QT/QTc/QTd: 468 ms / 450 ms / 89 ms - Heart rate (maximum exercise)  P/QRS/T axis: 62 deg / 27 deg / 8 deg - Heart rate (maximum exercise)  Heartrate: 50 bpm  Interpretation:   sinus bradycardia  minor inferior repolarization disturbance, consider ischemia, LV overload or aspecific change   flat or low negative T in aVF    with negative T in III   ECG without significant abnormalities    Appended Document: Orders Update - EKG    Clinical Lists Changes  Orders: Added new Service order of EKG w/ Interpretation (93000) - Signed

## 2010-03-25 NOTE — Assessment & Plan Note (Signed)
Summary: 3 month return/mhh   Primary Care Provider:  Alroy Dust  CC:  3 mo ROV & review....  History of Present Illness: 55 y/o BF here for a follow up visit...  he has multiple medical problems as noted below...     ~  December 11, 2008:  states her breathing has been stable w/ Prn Albut neb & Ventolin HFA... no asthma exac etc... she wants to discuss wt loss programs and asks about HCG diet plan suggested by her GYN... we discussed diet programs in general- calories in, calories out, & gimmicks... she will consider weight watchers & exercise program to get in shape...   ~  November 27, 2009:  she had some transient HBP last spring- saw TP & started Benicar but she doesn't want to have to take meds & stopped on her own... checking BP's at home & states they are OK w/ high 140/90 range- unfortunately BP 170/100+ today in office & she clearly needs BP meds- we discussed this & she has agreed to take generic Hyzaar... she denies CP, palpit, SOB, edema, etc... states breathing is good & still only using the Prn Albuterol inhaler (even though she uses it pretty much every day)- still declines to use controller med like Symbicort (higher co-pay)... CXR is WNL, and Labs are norm, she requested B12 level= 1398... wants letter exempting her from Flu shot due to allergy to eggs.   ~  March 05, 2010:  BP improved on Losartan/ HCT & low sodium diet but still sl elev at 150+/90s at home... she is asymptomatic w/o CP, palpit, SOB, edema, etc... we decided to add Amlodipine 5mg /d to her regimen...    Otherw stable> notes shoulder pain after fall, using Mobic Prn... uses VentolinHFA Prn.    Current Problem List:  ALLERGIC RHINITIS (ICD-477.9) - she uses OTC meds as needed.  ASTHMA (ICD-493.90) - only using Ventolin HFA as needed (she has NEB w/ Albuterol as well)... she hasn't been checking peak flows etc... she notes that she averages one inhalation daily- usually in the AM... she understands that she is  not on a controller drug, but she does not wish to change to Symbicort, Advair, Pulmicort, etc (despite my recommendation to start and stay on one of these medications)...   ESSENTIAL HYPERTENSION (ICD-401.9) - on LOSARTAN/Hct 100-12.5 daily... BP today = 140/94 & we discussed adding Amlodipine 5mg /d... denies HA, fatigue, visual changes, CP, palipit, dizziness, syncope, dyspnea, edema, etc...   HYPERCHOLESTEROLEMIA, BORDERLINE (ICD-272.4) - on diet Rx alone...   ~  FLP 12/08 showed TChol 178, TG 35, HDL 58, LDL 113  ~  FLP 11/10 showed TChol 168, TG 45, HDL 57, LDL 102  ~  FLP 10/11 showed TChol 185, TG 49, HDL 57, LDL 118  OVERWEIGHT (ICD-278.02) - we discussed diet + exercise program...  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 had colonoscopy 7/10 by DrJacobs= neg, no polyps etc... f/u plannned 69yrs.   Preventive Screening-Counseling & Management  Alcohol-Tobacco     Smoking Status: never  Allergies (verified): No Known Drug Allergies  Comments:  Nurse/Medical Assistant: The patient's medications and allergies were reviewed with  the patient and were updated in the Medication and Allergy Lists.  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 brother deceased age 22 with pancreatic cancer  1 sibling alive age 52 breast cancer in aunt mother alive age 109 hx of heart disease father deceased age  65 from heart failure-on vent hx of asthma 1 brother deceased age 21 from MVA  Social History: Reviewed history from 11/27/2009 and no changes required. 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    Vital Signs:  Patient profile:   55 year old female Height:      65 inches Weight:      219 pounds BMI:     36.58 O2 Sat:      98 % on Room air Temp:     96.8 degrees F oral Pulse rate:   60 / minute BP sitting:   140 / 94  (left arm) Cuff size:   regular  Vitals Entered By: Randell Loop CMA (March 05, 2010 11:00 AM)  O2 Sat at Rest %:  98 O2 Flow:  Room air CC: 3 mo ROV & review... Is Patient Diabetic? No Pain Assessment Patient in pain? no      Comments meds updated today with pt   Physical Exam  Additional Exam:  WD, WN, 54 y/o BF in NAD... GENERAL:  Alert & oriented; pleasant & cooperative... HEENT:  [place]/AT, EOM-wnl, PERRLA, EACs-clear, TMs-wnl, NOSE-clear, THROAT-clear & wnl. NECK:  Supple w/ full ROM; no JVD; normal carotid impulses w/o bruits; no thyromegaly or nodules palpated; no lymphadenopathy. CHEST:  Clear to P & A; without wheezes/ rales/ or rhonchi heard... HEART:  Regular Rhythm; without murmurs/ rubs/ or gallops detected... ABDOMEN:  Soft & nontender; normal bowel sounds; no organomegaly or  masses palpated... EXT: without deformities or arthritic changes; no varicose veins/ +venous insuffic/ no edema. NEURO:  CN's intact; motor testing normal; sensory testing normal; gait normal & balance OK. DERM:  No lesions noted; no rash etc...    Impression & Recommendations:  Problem # 1:  ESSENTIAL HYPERTENSION (ICD-401.9) BP is still sl elev & we discussed adding Amlodipine 5mg ... Her updated medication list for this problem includes:    Losartan Potassium-hctz 100-12.5 Mg Tabs (Losartan potassium-hctz) .Marland Kitchen... Take 1 tab by mouth once daily...    Amlodipine Besylate 5 Mg Tabs (Amlodipine besylate) .Marland Kitchen... Take 1 tab by mouth once daily...  Problem # 2:  ASTHMA (ICD-493.90) Stable>  continue Prn bronchodilators... Her updated medication list for this problem includes:    Albuterol Sulfate (2.5 Mg/26ml) 0.083% Nebu (Albuterol sulfate) ..... Use in nebulizer up to 3 times daily as needed    Ventolin Hfa 108 (90 Base) Mcg/act Aers (Albuterol sulfate) .Marland Kitchen... 1-2 puffs q 4 h as needed for wheezing...  Problem # 3:  HYPERCHOLESTEROLEMIA, BORDERLINE (ICD-272.4) On diet Rx alone...  Problem # 4:  OVERWEIGHT (ICD-278.02) We reviewed diet + exercise...  Problem # 5:  OTHER MEDICAL PROBLEMS AS NOTED>>>>  Complete Medication List: 1)  Albuterol Sulfate (2.5 Mg/72ml) 0.083% Nebu (Albuterol sulfate) .... Use in nebulizer up to 3 times daily as needed 2)  Ventolin Hfa 108 (90 Base) Mcg/act Aers (Albuterol sulfate) .Marland Kitchen.. 1-2 puffs q 4 h as needed for wheezing... 3)  Losartan Potassium-hctz 100-12.5 Mg Tabs (Losartan potassium-hctz) .... Take 1 tab by mouth once daily.Marland KitchenMarland Kitchen 4)  Amlodipine Besylate 5 Mg Tabs (Amlodipine besylate) .... Take 1 tab by mouth once daily.Marland KitchenMarland Kitchen 5)  Meloxicam 7.5 Mg Tabs (Meloxicam) .... Take 1 tablet by mouth once a day  Patient Instructions: 1)  Today we updated your med list- see below.... 2)  We decided to add AMLODIPINE (NORVASC) 5mg /d to your BP regimen... 3)  Remember to  elim salt, & get on track w/ your diet + exercise program... 4)  Continuie top monitor your BP at home & call for problems.Marland KitchenMarland Kitchen 5)  Please schedule a follow-up appointment in 4 months. Prescriptions: AMLODIPINE BESYLATE 5 MG TABS (AMLODIPINE BESYLATE) take 1 tab by mouth once daily...  #90 x 4   Entered and Authorized by:   Michele Mcalpine MD   Signed by:   Michele Mcalpine MD on 03/05/2010   Method used:   Print then Give to Patient   RxID:   [id number]

## 2010-06-25 ENCOUNTER — Encounter: Payer: Self-pay | Admitting: Pulmonary Disease

## 2010-07-02 ENCOUNTER — Ambulatory Visit (INDEPENDENT_AMBULATORY_CARE_PROVIDER_SITE_OTHER): Payer: 59 | Admitting: Pulmonary Disease

## 2010-07-02 ENCOUNTER — Encounter: Payer: Self-pay | Admitting: Pulmonary Disease

## 2010-07-02 DIAGNOSIS — J309 Allergic rhinitis, unspecified: Secondary | ICD-10-CM

## 2010-07-02 DIAGNOSIS — M1990 Unspecified osteoarthritis, unspecified site: Secondary | ICD-10-CM | POA: Insufficient documentation

## 2010-07-02 DIAGNOSIS — I10 Essential (primary) hypertension: Secondary | ICD-10-CM

## 2010-07-02 DIAGNOSIS — I509 Heart failure, unspecified: Secondary | ICD-10-CM

## 2010-07-02 DIAGNOSIS — J45909 Unspecified asthma, uncomplicated: Secondary | ICD-10-CM

## 2010-07-02 DIAGNOSIS — E785 Hyperlipidemia, unspecified: Secondary | ICD-10-CM

## 2010-07-02 DIAGNOSIS — J189 Pneumonia, unspecified organism: Secondary | ICD-10-CM

## 2010-07-02 DIAGNOSIS — J449 Chronic obstructive pulmonary disease, unspecified: Secondary | ICD-10-CM

## 2010-07-02 DIAGNOSIS — E663 Overweight: Secondary | ICD-10-CM

## 2010-07-02 DIAGNOSIS — F411 Generalized anxiety disorder: Secondary | ICD-10-CM

## 2010-07-02 MED ORDER — SULFAMETHOXAZOLE-TRIMETHOPRIM 800-160 MG PO TABS: 1.0000 | ORAL_TABLET | Freq: Two times a day (BID) | ORAL | Status: DC

## 2010-07-02 MED ORDER — FUROSEMIDE 40 MG PO TABS: ORAL_TABLET | ORAL | Status: DC

## 2010-07-02 MED ORDER — OMEPRAZOLE 40 MG PO CPDR: DELAYED_RELEASE_CAPSULE | ORAL | Status: DC

## 2010-07-02 NOTE — Progress Notes (Signed)
Subjective:    Patient ID: Brenda Russo, female    DOB: 1955/02/29, 55 y.o.   MRN: [id number]  HPI 55 y/o BF here for a follow up visit...  he has multiple medical problems as noted below...    ~  December 11, 2008:  states her breathing has been stable w/ Prn Albut neb & Ventolin HFA... no asthma exac etc... she wants to discuss wt loss programs and asks about HCG diet plan suggested by her GYN... we discussed diet programs in general- calories in, calories out, & gimmicks... she will consider weight watchers & exercise program to get in shape...  ~  November 27, 2009:  she had some transient HBP last spring- saw TP & started Benicar but she doesn't want to have to take meds & stopped on her own... checking BP's at home & states they are OK w/ high 140/90 range- unfortunately BP 170/100+ today in office & she clearly needs BP meds- we discussed this & she has agreed to take generic Hyzaar... she denies CP, palpit, SOB, edema, etc... states breathing is good & still only using the Prn Albuterol inhaler (even though she uses it pretty much every day)- still declines to use controller med like Symbicort (higher co-pay)... CXR is WNL, and Labs are norm, she requested B12 level= 1398... wants letter exempting her from Flu shot due to allergy to eggs.  ~  March 05, 2010:  BP improved on Losartan/ HCT & low sodium diet but still sl elev at 150+/90s at home... she is asymptomatic w/o CP, palpit, SOB, edema, etc... we decided to add Amlodipine 5mg /d to her regimen... Otherw stable> notes shoulder pain after fall, using Mobic Prn... uses VentolinHFA Prn.  ~  Jul 02, 2010:  54mo ROV & he breathing is doing very well w/o exac etc;  BP has been well controlled on the Hyzaar/ Norvasc & she decr the Amlodipine 5mg  to 1/2 tab daily;  She notes new GERD/ reflux symptoms at night- denies cough/ choking- but bothered by heartburn & we discussed further eval vs Rx trial w/ OMEP40 at dinnertime, NPO after dinner,  elev HOB 6", & PEPCID Qhs...  She has purchased another supplemental diet system> Complete Nutrition at [facility]- fishOil, Vit & herbal supplements reviewed w/ pt (~$150/mo)...           Problem List:  ALLERGIC RHINITIS (ICD-477.9) - she uses OTC meds as needed.  ASTHMA (ICD-493.90) - only using Ventolin HFA as needed (she has NEB w/ Albuterol as well)... she hasn't been checking peak flows etc... she notes that she averages one inhalation daily- usually in the AM... she understands that she is not on a controller drug, but she does not wish to change to Symbicort, Advair, Pulmicort, etc (despite my recommendation to start and stay on one of these medications)...   HYPERTENSION (ICD-401.9) - on LOSARTAN/Hct 100-12.5 daily, & AMLODIPINE 5mg - 1/2 tab daily... BP today = 110/80 > denies HA, fatigue, visual changes, CP, palipit, dizziness, syncope, dyspnea, edema, etc... ~  1/12:  BP= 140/94 on Hyzaar & we decided to add NORVASC 5mg /d... ~  5/12:  Pt reports that she decr the Amlodipine 5mg  to 1/2 tab daily & BP checks at home have been good (110/80 here today).  HYPERCHOLESTEROLEMIA, BORDERLINE (ICD-272.4) - on diet Rx alone...  ~  FLP 12/08 showed TChol 178, TG 35, HDL 58, LDL 113 ~  FLP 11/10 showed TChol 168, TG 45, HDL 57, LDL 102 ~  FLP 10/11  showed TChol 185, TG 49, HDL 57, LDL 118  OVERWEIGHT (ICD-278.02) - we discussed diet + exercise program... ~  weight 1/07 = 181# ~  weight 6/09 = 206# ~  weight 11/10 = 213# ~  weight 10/11 = 213# ~  Weight 5/12 = 219#  DEGENERATIVE ARTHRITIS>  She has DJD/ OA & uses OTC anti-inflamm meds vs MOBIC 7.5mg  Prn...  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 had colonoscopy 7/10 by DrJacobs= neg, no polyps etc... f/u plannned 34yrs.   Past Surgical History  Procedure Date   . Cesarean section     Outpatient Encounter Prescriptions as of 07/02/2010  Medication Sig Dispense Refill  . albuterol (PROVENTIL) (2.5 MG/3ML) 0.083% nebulizer solution Take 2.5 mg by nebulization 3 (three) times daily as needed.        Marland Kitchen albuterol (VENTOLIN HFA) 108 (90 BASE) MCG/ACT inhaler Inhale 2 puffs into the lungs every 4 (four) hours as needed.        Marland Kitchen amLODipine (NORVASC) 5 MG tablet 1/2 tablet daily      . losartan-hydrochlorothiazide (HYZAAR) 100-12.5 MG per tablet Take 1 tablet by mouth daily.        . magnesium hydroxide (MILK OF MAGNESIA) 800 MG/5ML suspension Take 15 mLs by mouth every evening.        . meloxicam (MOBIC) 7.5 MG tablet Take 7.5 mg by mouth daily as needed.       . Multiple Vitamins-Minerals (COMPLETE WOMENS PO) Take 1 capsule by mouth 2 (two) times daily.        Marland Kitchen omeprazole (PRILOSEC) 40 MG capsule Take 1 capsule 30 minutes before dinner daily  90 capsule  3    No Known Allergies   Review of Systems      See HPI - all other systems neg except as noted...  The patient denies anorexia, fever, weight loss, weight gain, vision loss, decreased hearing, hoarseness, chest pain, syncope, dyspnea on exertion, peripheral edema, prolonged cough, headaches, hemoptysis, abdominal pain, melena, hematochezia, severe indigestion/heartburn, hematuria, incontinence, muscle weakness, suspicious skin lesions, transient blindness, difficulty walking, depression, unusual weight change, abnormal bleeding, enlarged lymph nodes, and angioedema.   Objective:   Physical Exam     WD, WN, 55 y/o BF in NAD... GENERAL:  Alert & oriented; pleasant & cooperative... HEENT:  [place]/AT, EOM-wnl, PERRLA, EACs-clear, TMs-wnl, NOSE-clear, THROAT-clear & wnl. NECK:  Supple w/ full ROM; no JVD; normal carotid impulses w/o bruits; no thyromegaly or nodules palpated; no lymphadenopathy. CHEST:  Clear to P & A; without wheezes/ rales/ or rhonchi heard... HEART:  Regular Rhythm; without murmurs/  rubs/ or gallops detected... ABDOMEN:  Soft & nontender; normal bowel sounds; no organomegaly or masses palpated... EXT: without deformities or arthritic changes; no varicose veins/ +venous insuffic/ no edema. NEURO:  CN's intact; motor testing normal; sensory testing normal; gait normal & balance OK. DERM:  No lesions noted; no rash etc...   Assessment & Plan:   ASTHMA>  She is clear today, control seems improved, using prn inhaler vs Neb...  HBP>  BP improved w/ 2 drug regimen, she decr the Norvasc & BP is OK, continue same...  CHOL>  Not too bad on diet alone w/ last LDL 118;  Needs better diet & get wt down!!!  OVERWEIGHT>  Wt reduction is key to her  BP/ DM/ etc...  GERD>  She has new GERD/ Reflux symptoms & we reviewed PPI Rx, antireflux regimen, etc...  DJD>  She uses Mobic Prn...  ANXIETY>  She does not want anxiolytic Rx.Marland KitchenMarland Kitchen

## 2010-07-02 NOTE — Patient Instructions (Signed)
Today we updated your med list in EPIC...    Continue your current meds the same (including the NORVASC 5mg - 1/2 tab daily)...  For your REFLUX:    Start the new OMEPRAZOLE 40mg  taken 30 min before dinner...    Try to eat your dinner as early as poss (5-6-7PM) & then NPO (nothing by mouth) after dinner (to let your stomach empty)...    Elevate the head of your bed on 6" blocks...    You may also take PEPCID OTC 1-2 tabs w/ a sip of water prior to bedtime...  Call for any questions, or if not responding to this regimen...  Let's plan our regular f/u appt in the fall.Marland KitchenMarland Kitchen

## 2010-08-05 ENCOUNTER — Inpatient Hospital Stay (INDEPENDENT_AMBULATORY_CARE_PROVIDER_SITE_OTHER)
Admission: RE | Admit: 2010-08-05 | Discharge: 2010-08-05 | Disposition: A | Discharge status: Home / Self Care | Payer: 59 | Source: Ambulatory Visit | Attending: Family Medicine | Admitting: Family Medicine

## 2010-08-05 DIAGNOSIS — H81319 Aural vertigo, unspecified ear: Secondary | ICD-10-CM

## 2010-08-05 DIAGNOSIS — H6120 Impacted cerumen, unspecified ear: Secondary | ICD-10-CM

## 2010-08-05 LAB — POCT I-STAT, CHEM 8
BUN: 10 mg/dL (ref 6–23)
Calcium, Ion: 1.15 mmol/L (ref 1.12–1.32)
Chloride: 105 mEq/L (ref 96–112)
Glucose, Bld: 103 mg/dL — ABNORMAL HIGH (ref 70–99)
TCO2: 25 mmol/L (ref 0–100)

## 2010-08-05 LAB — POCT URINALYSIS DIP (DEVICE)
Bilirubin Urine: NEGATIVE
Hgb urine dipstick: NEGATIVE
Nitrite: NEGATIVE
Urobilinogen, UA: 0.2 mg/dL (ref 0.0–1.0)
pH: 8.5 — ABNORMAL HIGH (ref 5.0–8.0)

## 2010-08-06 ENCOUNTER — Telehealth: Payer: Self-pay | Admitting: Pulmonary Disease

## 2010-08-06 NOTE — Telephone Encounter (Signed)
Spoke with the pt and she states she went to urgent care due to dizziness and headache x 2 weeks. She states they told her to see her pcp because they feel like her meds need to be reviewed. Appt set with TP on monday at 11:30. Carron Curie, CMA

## 2010-08-09 ENCOUNTER — Ambulatory Visit (INDEPENDENT_AMBULATORY_CARE_PROVIDER_SITE_OTHER): Payer: 59 | Admitting: Adult Health

## 2010-08-09 ENCOUNTER — Encounter: Payer: Self-pay | Admitting: Adult Health

## 2010-08-09 VITALS — BP 110/66 | HR 62 | Temp 97.2°F | Ht 65.0 in | Wt 215.8 lb

## 2010-08-09 DIAGNOSIS — R42 Dizziness and giddiness: Secondary | ICD-10-CM

## 2010-08-09 NOTE — Progress Notes (Signed)
Subjective:    Patient ID: Brenda Russo, female    DOB: 14-Dec-1955, 55 y.o.   MRN: [id number]  HPI 55 y/o AAF with known hx of HTN, Asthma, and Hyperlipidemia   ~  December 11, 2008:  states her breathing has been stable w/ Prn Albut neb & Ventolin HFA... no asthma exac etc... she wants to discuss wt loss programs and asks about HCG diet plan suggested by her GYN... we discussed diet programs in general- calories in, calories out, & gimmicks... she will consider weight watchers & exercise program to get in shape...  ~  November 27, 2009:  she had some transient HBP last spring- saw TP & started Benicar but she doesn't want to have to take meds & stopped on her own... checking BP's at home & states they are OK w/ high 140/90 range- unfortunately BP 170/100+ today in office & she clearly needs BP meds- we discussed this & she has agreed to take generic Hyzaar... she denies CP, palpit, SOB, edema, etc... states breathing is good & still only using the Prn Albuterol inhaler (even though she uses it pretty much every day)- still declines to use controller med like Symbicort (higher co-pay)... CXR is WNL, and Labs are norm, she requested B12 level= 1398... wants letter exempting her from Flu shot due to allergy to eggs.  ~  March 05, 2010:  BP improved on Losartan/ HCT & low sodium diet but still sl elev at 150+/90s at home... she is asymptomatic w/o CP, palpit, SOB, edema, etc... we decided to add Amlodipine 5mg /d to her regimen... Otherw stable> notes shoulder pain after fall, using Mobic Prn... uses VentolinHFA Prn.  ~  Jul 02, 2010:  2mo ROV & he breathing is doing very well w/o exac etc;  BP has been well controlled on the Hyzaar/ Norvasc & she decr the Amlodipine 5mg  to 1/2 tab daily;  She notes new GERD/ reflux symptoms at night- denies cough/ choking- but bothered by heartburn & we discussed further eval vs Rx trial w/ OMEP40 at dinnertime, NPO after dinner, elev HOB 6", & PEPCID Qhs...  She  has purchased another supplemental diet system> Complete Nutrition at [facility]- fishOil, Vit & herbal supplements reviewed w/ pt (~$150/mo)...  ~08/09/2010 ER follow up  Pt presents for follow up of dizziness. Pt complains over last 2 weeks of intermittent lightheadedness . Went to urgent care ER on 6/27 dx w/ vertigo , rx meclizine. She was told she might be having some orthostatic changes from her b/p meds. She decreased her Hyzaar to 1/2 tab and is feeling better. Took meclizine x 1 night. She is feeling better w/ less lightheadedness.  She denies visual/speech changes, arm weakness, or dysphagia.  Labs in Er were essentially unremarkable.           Problem List:  ALLERGIC RHINITIS (ICD-477.9) - she uses OTC meds as needed.  ASTHMA (ICD-493.90) - only using Ventolin HFA as needed (she has NEB w/ Albuterol as well)...  HYPERTENSION (ICD-401.9) - on LOSARTAN/Hct 100-12.5 daily, & AMLODIPINE 5mg - 1/2 tab daily... BP today = 110/80 > denies HA, fatigue, visual changes, CP, palipit, dizziness, syncope, dyspnea, edema, etc... ~  1/12:  BP= 140/94 on Hyzaar & we decided to add NORVASC 5mg /d... ~  5/12:  Pt reports that she decr the Amlodipine 5mg  to 1/2 tab daily & BP checks at home have been good (110/80 here today). ~08/09/2010 Hyzaar decreased to 1/2 daily   HYPERCHOLESTEROLEMIA, BORDERLINE (ICD-272.4) - on  diet Rx alone...  ~  FLP 12/08 showed TChol 178, TG 35, HDL 58, LDL 113 ~  FLP 11/10 showed TChol 168, TG 45, HDL 57, LDL 102 ~  FLP 10/11 showed TChol 185, TG 49, HDL 57, LDL 118  OVERWEIGHT (ICD-278.02) - we discussed diet + exercise program... ~  weight 1/07 = 181# ~  weight 6/09 = 206# ~  weight 11/10 = 213# ~  weight 10/11 = 213# ~  Weight 5/12 = 219#  DEGENERATIVE ARTHRITIS>  She has DJD/ OA & uses OTC anti-inflamm meds vs MOBIC 7.5mg  Prn...  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 had colonoscopy 7/10 by DrJacobs= neg, no polyps etc... f/u plannned 26yrs.    Review of Systems Constitutional:   No  weight loss, night sweats,  Fevers, chills, fatigue, or  lassitude.  HEENT:   No headaches,  Difficulty swallowing,  Tooth/dental problems, or  Sore throat,                No sneezing, itching, ear ache, nasal congestion, post nasal drip,   CV:  No chest pain,  Orthopnea, PND, swelling in lower extremities, anasarca, dizziness, palpitations, syncope.   GI  No heartburn, indigestion, abdominal pain, nausea, vomiting, diarrhea, change in bowel habits, loss of appetite, bloody stools.   Resp:   No coughing up of blood.  No change in color of mucus.  No wheezing.  No chest wall deformity  Skin: no rash or lesions.  GU: no dysuria, change in color of urine, no urgency or frequency.  No flank pain, no hematuria   MS:  No joint pain or swelling.  No decreased range of motion.  No back pain.  Psych:  No change in mood or affect. No depression or anxiety.  No memory loss.  Neuro: no confusion or vision/speech changes          Objective:   Physical Exam GEN: A/Ox3; pleasant , NAD  HEENT:  [place]/AT,  EACs-clear, TMs-wnl, NOSE-clear, THROAT-clear, no lesions, no postnasal drip or exudate noted.   NECK:  Supple w/ fair ROM; no JVD; normal carotid impulses w/o bruits; no thyromegaly or nodules palpated; no lymphadenopathy.  RESP  Clear  P & A; w/o, wheezes/ rales/ or rhonchi.no accessory muscle use, no dullness to percussion  CARD:  RRR, no m/r/g  , no peripheral edema, pulses intact, no cyanosis or clubbing.  GI:   Soft & nt; nml bowel sounds; no organomegaly or masses detected.  Musco: Warm bil, no deformities or joint swelling noted.   Neuro: alert, no focal deficits noted.  Neg rhomberg, PERRLA, MAEW x 4, nml grips.   Skin: Warm, no lesions or rashes         Assessment &  Plan:

## 2010-08-09 NOTE — Patient Instructions (Signed)
May take Hyzaar 1/2 daily  Low salt diet Check blood pressure 3 x week , call if >150/90 Fluids , rest, changes positions slowly  Meclizine 25mg  1/2 -1 every 8 hr As needed  For dizziness.  Avoid extreme heat.  Please contact office for sooner follow up if symptoms do not improve or worsen or seek emergency care  follow up Dr. Kriste Basque  As planned and As needed

## 2010-08-09 NOTE — Assessment & Plan Note (Addendum)
Lightheadedness ?vertigo vs orthostatic changes   Plan :  May take Hyzaar 1/2 daily  Low salt diet Check blood pressure 3 x week , call if >150/90 Fluids , rest, changes positions slowly  Meclizine 25mg  1/2 -1 every 8 hr As needed  For dizziness.  Avoid extreme heat.  Please contact office for sooner follow up if symptoms do not improve or worsen or seek emergency care  follow up Dr. Kriste Basque  As planned and As needed

## 2010-12-03 ENCOUNTER — Encounter: Payer: Self-pay | Admitting: Pulmonary Disease

## 2010-12-03 ENCOUNTER — Other Ambulatory Visit (INDEPENDENT_AMBULATORY_CARE_PROVIDER_SITE_OTHER): Payer: 59

## 2010-12-03 ENCOUNTER — Ambulatory Visit (INDEPENDENT_AMBULATORY_CARE_PROVIDER_SITE_OTHER)
Admission: RE | Admit: 2010-12-03 | Discharge: 2010-12-03 | Disposition: A | Discharge status: Home / Self Care | Payer: 59 | Source: Ambulatory Visit | Attending: Pulmonary Disease | Admitting: Pulmonary Disease

## 2010-12-03 ENCOUNTER — Ambulatory Visit (INDEPENDENT_AMBULATORY_CARE_PROVIDER_SITE_OTHER): Payer: 59 | Admitting: Pulmonary Disease

## 2010-12-03 VITALS — BP 122/72 | HR 58 | Temp 97.5°F | Ht 65.0 in | Wt 212.4 lb

## 2010-12-03 DIAGNOSIS — Z0000 Encounter for general adult medical examination without abnormal findings: Secondary | ICD-10-CM

## 2010-12-03 DIAGNOSIS — E785 Hyperlipidemia, unspecified: Secondary | ICD-10-CM

## 2010-12-03 DIAGNOSIS — I10 Essential (primary) hypertension: Secondary | ICD-10-CM

## 2010-12-03 DIAGNOSIS — E559 Vitamin D deficiency, unspecified: Secondary | ICD-10-CM

## 2010-12-03 DIAGNOSIS — E663 Overweight: Secondary | ICD-10-CM

## 2010-12-03 DIAGNOSIS — M1990 Unspecified osteoarthritis, unspecified site: Secondary | ICD-10-CM

## 2010-12-03 DIAGNOSIS — J45909 Unspecified asthma, uncomplicated: Secondary | ICD-10-CM

## 2010-12-03 DIAGNOSIS — J309 Allergic rhinitis, unspecified: Secondary | ICD-10-CM

## 2010-12-03 LAB — LIPID PANEL
HDL: 59.2 mg/dL (ref >39.00)
Total CHOL/HDL Ratio: 3

## 2010-12-03 LAB — TSH: TSH: 1.63 u[IU]/mL (ref 0.35–5.50)

## 2010-12-03 LAB — CBC WITH DIFFERENTIAL/PLATELET
Basophils Absolute: 0 10*3/uL (ref 0.0–0.1)
Eosinophils Absolute: 0.2 10*3/uL (ref 0.0–0.7)
Hemoglobin: 13.8 g/dL (ref 12.0–15.0)
Lymphocytes Relative: 33 % (ref 12.0–46.0)
MCHC: 33.5 g/dL (ref 30.0–36.0)
Monocytes Relative: 5.5 % (ref 3.0–12.0)
Neutro Abs: 3.7 10*3/uL (ref 1.4–7.7)
Neutrophils Relative %: 58.3 % (ref 43.0–77.0)
Platelets: 280 10*3/uL (ref 150.0–400.0)
RDW: 14.3 % (ref 11.5–14.6)

## 2010-12-03 LAB — BASIC METABOLIC PANEL
CO2: 28 mEq/L (ref 19–32)
Chloride: 106 mEq/L (ref 96–112)
Glucose, Bld: 96 mg/dL (ref 70–99)
Potassium: 3.7 mEq/L (ref 3.5–5.1)
Sodium: 141 mEq/L (ref 135–145)

## 2010-12-03 LAB — HEPATIC FUNCTION PANEL
ALT: 15 U/L (ref 0–35)
AST: 20 U/L (ref 0–37)
Albumin: 3.7 g/dL (ref 3.5–5.2)
Alkaline Phosphatase: 80 U/L (ref 39–117)
Bilirubin, Direct: 0 mg/dL (ref 0.0–0.3)
Total Protein: 7.6 g/dL (ref 6.0–8.3)

## 2010-12-03 MED ORDER — LOSARTAN POTASSIUM-HCTZ 100-12.5 MG PO TABS: ORAL_TABLET | ORAL | Status: DC

## 2010-12-03 MED ORDER — ALBUTEROL SULFATE HFA 108 (90 BASE) MCG/ACT IN AERS: 2.0000 | INHALATION_SPRAY | RESPIRATORY_TRACT | Status: DC | PRN

## 2010-12-03 MED ORDER — AMLODIPINE BESYLATE 5 MG PO TABS: ORAL_TABLET | ORAL | Status: DC

## 2010-12-03 NOTE — Patient Instructions (Signed)
Today we updated your med list in our EPIC system...    Continue your current medications the same...    We will order the VENTOLIN [facility] for you as requested since the Proair preferred med doesn't work for you...  Today we did your follow up CXR & fasting blood work...    Please call the PHONE TREE in a few days for your results...    Dial [id number] & when prompted enter your patient number followed by the # symbol...    Your patient number is:  [phone]  Keep up the good work on your diet program, & let's get more active (increase the exercise program)...  Call for any questions...  Let's plan a follow up visit to check your BP, breathing, weight, etc in about 6 months.Marland KitchenMarland Kitchen

## 2010-12-04 ENCOUNTER — Encounter: Payer: Self-pay | Admitting: Pulmonary Disease

## 2010-12-04 NOTE — Progress Notes (Signed)
Subjective:    Patient ID: Brenda Russo, female    DOB: [birth year], 55 y.o.   MRN: [id number]  HPI  55 y/o BF here for a follow up visit...  he has multiple medical problems as noted below...    ~  December 11, 2008:  states her breathing has been stable w/ Prn Albut neb & Ventolin HFA... no asthma exac etc... she wants to discuss wt loss programs and asks about HCG diet plan suggested by her GYN... we discussed diet programs in general- calories in, calories out, & gimmicks... she will consider weight watchers & exercise program to get in shape...  ~  November 27, 2009:  she had some transient HBP last spring- saw TP & started Benicar but she doesn't want to have to take meds & stopped on her own... checking BP's at home & states they are OK w/ high 140/90 range- unfortunately BP 170/100+ today in office & she clearly needs BP meds- we discussed this & she has agreed to take generic Hyzaar... she denies CP, palpit, SOB, edema, etc... states breathing is good & still only using the Prn Albuterol inhaler (even though she uses it pretty much every day)- still declines to use controller med like Symbicort (higher co-pay)... CXR is WNL, and Labs are norm, she requested B12 level= 1398... wants letter exempting her from Flu shot due to allergy to eggs.  ~  March 05, 2010:  BP improved on Losartan/ HCT & low sodium diet but still sl elev at 150+/90s at home... she is asymptomatic w/o CP, palpit, SOB, edema, etc... we decided to add Amlodipine 5mg /d to her regimen... Otherw stable> notes shoulder pain after fall, using Mobic Prn... uses VentolinHFA Prn.  ~  Jul 02, 2010:  68mo ROV & he breathing is doing very well w/o exac etc;  BP has been well controlled on the Hyzaar/ Norvasc & she decr the Amlodipine 5mg  to 1/2 tab daily;  She notes new GERD/ reflux symptoms at night- denies cough/ choking- but bothered by heartburn & we discussed further eval vs Rx trial w/ OMEP40 at dinnertime, NPO after dinner,  elev HOB 6", & PEPCID Qhs...  She has purchased another supplemental diet system> Complete Nutrition at [facility], Vit & herbal supplements reviewed w/ pt (~$150/mo)...  ~  December 03, 2010:  88mo ROV & CPX> her CC is pain, tender at left thumb IP joint & rec to try hot soaks, Mobic daily vs Advil (offered Ortho- hand eval but she prefers to wait)... States she is ALLERGIC to EGGS & needs form completed to exempt her from Flu vaccines...    AR/ Asthma> on VENTOLIN HFA (& refuses Proair- "it doesn't work for me"); no recent asthma attacks or exac...    HBP> on Amlodipine5- 1/2tab, & Hyzaar100- 1/2tab; BP= 122/72 and prev dizzy resolved; she denies HAs, CP, palpit, ch in SOB, edema...    Chol> on diet alone; LDL has not been at goal & she is encouraged to get on low chol/ low fat diet/ get wt down...    Overweight> wt is down 7# to 212# on diet- "I watch what I eat" and we discussed incr exercise...    DJD> on Mobic as needed    Anxiety> she is coping well & declines anxiolytic Rx...           Problem List:  ALLERGIC RHINITIS (ICD-477.9) - she uses OTC meds as needed.  ASTHMA (ICD-493.90) - only using Ventolin HFA as needed (  she has NEB w/ Albuterol as well)... she hasn't been checking peak flows etc... she notes that she averages one inhalation daily- usually in the AM... she understands that she is not on a controller drug, but she does not wish to change to Symbicort, Advair, Pulmicort, etc (despite my recommendation to start and stay on one of these medications)...   HYPERTENSION (ICD-401.9) - on LOSARTAN/Hct 100-12.5 taking 1/2tab daily, & AMLODIPINE 5mg - 1/2tab daily... ~  1/12:  BP= 140/94 on Hyzaar & we decided to add NORVASC 5mg /d... ~  5/12:  Pt reports that she decr the Amlodipine 5mg  to 1/2 tab daily & BP checks at home have been good (110/80 here today). ~  10/12:  Pt reports that she decr both meds to 1/2tab daily due to dizziness & improved...  HYPERCHOLESTEROLEMIA,  BORDERLINE (ICD-272.4) - on diet Rx alone...  ~  FLP 12/08 showed TChol 178, TG 35, HDL 58, LDL 113 ~  FLP 11/10 showed TChol 168, TG 45, HDL 57, LDL 102 ~  FLP 10/11 showed TChol 185, TG 49, HDL 57, LDL 118 ~  FLP 10/12 showed TChol 164, TG 30, HDL 59, LDL 99... Great, keep up the good work!  OVERWEIGHT (ICD-278.02) - we discussed diet + exercise program... ~  weight 1/07 = 181# ~  weight 6/09 = 206# ~  weight 11/10 = 213# ~  weight 10/11 = 213# ~  Weight 5/12 = 219# ~  Weight 10/12 = 212#  DEGENERATIVE ARTHRITIS>  She has DJD/ OA & uses OTC anti-inflamm meds vs MOBIC 7.5mg  Prn...  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 had colonoscopy 7/10 by DrJacobs= neg, no polyps etc... f/u plannned 43yrs.   Past Surgical History  Procedure Date  . Cesarean section     Outpatient Encounter Prescriptions as of 12/03/2010  Medication Sig Dispense Refill  . albuterol (PROVENTIL) (2.5 MG/3ML) 0.083% nebulizer solution Take 2.5 mg by nebulization 3 (three) times daily as needed.        Marland Kitchen albuterol (VENTOLIN HFA) 108 (90 BASE) MCG/ACT inhaler Inhale 2 puffs into the lungs every 4 (four) hours as needed.  3 Inhaler  3  . amLODipine (NORVASC) 5 MG tablet 1/2 tablet daily  90 tablet  3  . losartan-hydrochlorothiazide (HYZAAR) 100-12.5 MG per tablet Take 1/2 tablet by mouth once daily  90 tablet  3  . meloxicam (MOBIC) 7.5 MG tablet Take 7.5 mg by mouth daily as needed.       . Multiple Vitamins-Minerals (COMPLETE WOMENS PO) Take 1 capsule by mouth 2 (two) times daily.        Marland Kitchen DISCONTD: omeprazole (PRILOSEC) 40 MG capsule Take 1 capsule 30 minutes before dinner daily  90 capsule  3    Allergies  Allergen Reactions  . Eggs Or Egg-Derived Products     Shortness of breath, wheezing    Current Medications, Allergies, Past  Medical History, Past Surgical History, Family History, and Social History were reviewed in [employer] record.    Review of Systems      See HPI - all other systems neg except as noted...  The patient denies anorexia, fever, weight loss, weight gain, vision loss, decreased hearing, hoarseness, chest pain, syncope, dyspnea on exertion, peripheral edema, prolonged cough, headaches, hemoptysis, abdominal pain, melena, hematochezia, severe indigestion/heartburn,  hematuria, incontinence, muscle weakness, suspicious skin lesions, transient blindness, difficulty walking, depression, unusual weight change, abnormal bleeding, enlarged lymph nodes, and angioedema.   Objective:   Physical Exam     WD, WN, 55 y/o BF in NAD... GENERAL:  Alert & oriented; pleasant & cooperative... HEENT:  [place]/AT, EOM-wnl, PERRLA, EACs-clear, TMs-wnl, NOSE-clear, THROAT-clear & wnl. NECK:  Supple w/ full ROM; no JVD; normal carotid impulses w/o bruits; no thyromegaly or nodules palpated; no lymphadenopathy. CHEST:  Clear to P & A; without wheezes/ rales/ or rhonchi heard... HEART:  Regular Rhythm; without murmurs/ rubs/ or gallops detected... ABDOMEN:  Soft & nontender; normal bowel sounds; no organomegaly or masses palpated... EXT: without deformities or arthritic changes; no varicose veins/ +venous insuffic/ no edema. NEURO:  CN's intact; motor testing normal; sensory testing normal; gait normal & balance OK. DERM:  No lesions noted; no rash etc...   Assessment & Plan:   ASTHMA>  She is clear today, control seems improved, using prn inhaler vs Neb...  HBP>  BP improved w/ 2 drug regimen, she decr the meds to 1/2 & BP is OK, continue same...  CHOL>  Improved on diet alone w/ FLP at goal;  Needs continued diet & get wt down!!!  OVERWEIGHT>  Wt reduction is key to her BP/ DM/ etc...  GERD>  She has new GERD/ Reflux symptoms & we reviewed PPI Rx, antireflux regimen, etc...  DJD>  She uses  Mobic Prn...  ANXIETY>  She does not want anxiolytic Rx.Marland KitchenMarland Kitchen

## 2011-01-03 ENCOUNTER — Encounter: Payer: Self-pay | Admitting: Pulmonary Disease

## 2011-01-21 ENCOUNTER — Other Ambulatory Visit (HOSPITAL_COMMUNITY): Payer: Self-pay | Admitting: Orthopedic Surgery

## 2011-01-21 DIAGNOSIS — M25511 Pain in right shoulder: Secondary | ICD-10-CM

## 2011-01-22 ENCOUNTER — Ambulatory Visit (HOSPITAL_COMMUNITY)
Admission: RE | Admit: 2011-01-22 | Discharge: 2011-01-22 | Disposition: A | Discharge status: Home / Self Care | Payer: PRIVATE HEALTH INSURANCE | Source: Ambulatory Visit | Attending: Orthopedic Surgery | Admitting: Orthopedic Surgery

## 2011-01-22 DIAGNOSIS — M25519 Pain in unspecified shoulder: Secondary | ICD-10-CM | POA: Insufficient documentation

## 2011-01-22 DIAGNOSIS — M719 Bursopathy, unspecified: Secondary | ICD-10-CM | POA: Insufficient documentation

## 2011-01-22 DIAGNOSIS — M67919 Unspecified disorder of synovium and tendon, unspecified shoulder: Secondary | ICD-10-CM | POA: Insufficient documentation

## 2011-01-22 DIAGNOSIS — M25511 Pain in right shoulder: Secondary | ICD-10-CM

## 2011-02-10 ENCOUNTER — Ambulatory Visit
Payer: PRIVATE HEALTH INSURANCE | Attending: Orthopedic Surgery | Admitting: Rehabilitative and Restorative Service Providers"

## 2011-02-10 DIAGNOSIS — IMO0001 Reserved for inherently not codable concepts without codable children: Secondary | ICD-10-CM | POA: Insufficient documentation

## 2011-02-10 DIAGNOSIS — M25519 Pain in unspecified shoulder: Secondary | ICD-10-CM | POA: Insufficient documentation

## 2011-02-10 DIAGNOSIS — R293 Abnormal posture: Secondary | ICD-10-CM | POA: Insufficient documentation

## 2011-02-14 ENCOUNTER — Telehealth: Payer: Self-pay | Admitting: Pulmonary Disease

## 2011-02-14 MED ORDER — AZITHROMYCIN 250 MG PO TABS: ORAL_TABLET | ORAL | Status: AC

## 2011-02-14 NOTE — Telephone Encounter (Signed)
Spoke with pt and notified of recs per SN. Pt verbalized understanding. Rx for zpack was sent to pharm

## 2011-02-14 NOTE — Telephone Encounter (Signed)
Pt c/o chest congestion, cough with intermittent foamy white clear mucus worse at night x 1 week. Denies chest tightness, wheezing, f/b/c/s, n/v/d. Is concerned due to her HTN to try any medications w/o advisement. Please advise, thank you.  Allergies  Allergen Reactions  . Eggs Or Egg-Derived Products     Shortness of breath, wheezing    [facility]

## 2011-02-14 NOTE — Telephone Encounter (Signed)
Per TP---zpak #1  Take as directed with no refills, mucinex dm prn with plenty of fluids, saline nasal spray, fluids and rest.  thanks

## 2011-02-14 NOTE — Telephone Encounter (Signed)
[id number] x1 

## 2011-02-28 ENCOUNTER — Ambulatory Visit: Payer: PRIVATE HEALTH INSURANCE | Admitting: Physical Therapy

## 2011-02-28 ENCOUNTER — Encounter: Payer: 59 | Admitting: Physical Therapy

## 2011-03-02 ENCOUNTER — Ambulatory Visit: Payer: PRIVATE HEALTH INSURANCE | Admitting: Physical Therapy

## 2011-03-08 ENCOUNTER — Ambulatory Visit: Payer: PRIVATE HEALTH INSURANCE | Admitting: Rehabilitative and Restorative Service Providers"

## 2011-03-09 ENCOUNTER — Ambulatory Visit: Payer: 59 | Admitting: Radiation Oncology

## 2011-03-09 ENCOUNTER — Ambulatory Visit: Payer: 59

## 2011-03-09 ENCOUNTER — Ambulatory Visit: Payer: PRIVATE HEALTH INSURANCE | Admitting: Rehabilitative and Restorative Service Providers"

## 2011-03-14 ENCOUNTER — Ambulatory Visit
Payer: PRIVATE HEALTH INSURANCE | Attending: Orthopedic Surgery | Admitting: Rehabilitative and Restorative Service Providers"

## 2011-03-14 DIAGNOSIS — M25519 Pain in unspecified shoulder: Secondary | ICD-10-CM | POA: Insufficient documentation

## 2011-03-14 DIAGNOSIS — IMO0001 Reserved for inherently not codable concepts without codable children: Secondary | ICD-10-CM | POA: Insufficient documentation

## 2011-03-14 DIAGNOSIS — R293 Abnormal posture: Secondary | ICD-10-CM | POA: Insufficient documentation

## 2011-03-16 ENCOUNTER — Ambulatory Visit: Payer: PRIVATE HEALTH INSURANCE | Admitting: Rehabilitative and Restorative Service Providers"

## 2011-03-21 ENCOUNTER — Ambulatory Visit: Payer: PRIVATE HEALTH INSURANCE | Admitting: Rehabilitative and Restorative Service Providers"

## 2011-03-23 ENCOUNTER — Ambulatory Visit: Payer: PRIVATE HEALTH INSURANCE | Admitting: Rehabilitative and Restorative Service Providers"

## 2011-03-29 ENCOUNTER — Ambulatory Visit: Payer: PRIVATE HEALTH INSURANCE | Admitting: Physical Therapy

## 2011-03-31 ENCOUNTER — Ambulatory Visit: Payer: PRIVATE HEALTH INSURANCE | Admitting: Physical Therapy

## 2011-04-04 ENCOUNTER — Ambulatory Visit: Payer: PRIVATE HEALTH INSURANCE | Admitting: Rehabilitative and Restorative Service Providers"

## 2011-04-06 ENCOUNTER — Ambulatory Visit: Payer: PRIVATE HEALTH INSURANCE | Admitting: Rehabilitative and Restorative Service Providers"

## 2011-04-12 ENCOUNTER — Ambulatory Visit
Payer: PRIVATE HEALTH INSURANCE | Attending: Orthopedic Surgery | Admitting: Rehabilitative and Restorative Service Providers"

## 2011-04-12 DIAGNOSIS — IMO0001 Reserved for inherently not codable concepts without codable children: Secondary | ICD-10-CM | POA: Insufficient documentation

## 2011-04-12 DIAGNOSIS — M25519 Pain in unspecified shoulder: Secondary | ICD-10-CM | POA: Insufficient documentation

## 2011-04-12 DIAGNOSIS — R293 Abnormal posture: Secondary | ICD-10-CM | POA: Insufficient documentation

## 2011-04-14 ENCOUNTER — Encounter: Payer: 59 | Admitting: Rehabilitative and Restorative Service Providers"

## 2011-04-18 ENCOUNTER — Ambulatory Visit: Payer: PRIVATE HEALTH INSURANCE | Admitting: Rehabilitative and Restorative Service Providers"

## 2011-04-21 ENCOUNTER — Ambulatory Visit: Payer: PRIVATE HEALTH INSURANCE | Admitting: Rehabilitative and Restorative Service Providers"

## 2011-04-25 ENCOUNTER — Ambulatory Visit: Payer: PRIVATE HEALTH INSURANCE | Admitting: Rehabilitative and Restorative Service Providers"

## 2011-06-03 ENCOUNTER — Ambulatory Visit (INDEPENDENT_AMBULATORY_CARE_PROVIDER_SITE_OTHER): Payer: 59 | Admitting: Pulmonary Disease

## 2011-06-03 ENCOUNTER — Ambulatory Visit: Payer: 59 | Admitting: Pulmonary Disease

## 2011-06-03 ENCOUNTER — Encounter: Payer: Self-pay | Admitting: Pulmonary Disease

## 2011-06-03 ENCOUNTER — Other Ambulatory Visit (INDEPENDENT_AMBULATORY_CARE_PROVIDER_SITE_OTHER): Payer: 59

## 2011-06-03 VITALS — BP 142/90 | HR 58 | Temp 97.1°F | Ht 65.0 in | Wt 216.2 lb

## 2011-06-03 DIAGNOSIS — I10 Essential (primary) hypertension: Secondary | ICD-10-CM

## 2011-06-03 DIAGNOSIS — E785 Hyperlipidemia, unspecified: Secondary | ICD-10-CM

## 2011-06-03 DIAGNOSIS — F411 Generalized anxiety disorder: Secondary | ICD-10-CM

## 2011-06-03 DIAGNOSIS — J45909 Unspecified asthma, uncomplicated: Secondary | ICD-10-CM

## 2011-06-03 DIAGNOSIS — E663 Overweight: Secondary | ICD-10-CM

## 2011-06-03 DIAGNOSIS — M1990 Unspecified osteoarthritis, unspecified site: Secondary | ICD-10-CM

## 2011-06-03 LAB — BASIC METABOLIC PANEL
Calcium: 8.9 mg/dL (ref 8.4–10.5)
GFR: 121.91 mL/min (ref >60.00)
Glucose, Bld: 86 mg/dL (ref 70–99)
Sodium: 141 mEq/L (ref 135–145)

## 2011-06-03 LAB — CORTISOL: Cortisol, Plasma: 7.8 ug/dL

## 2011-06-03 NOTE — Patient Instructions (Signed)
Today we updated your med list in our EPIC system...    We removed the Amlodipine & Hyzaar from your med list as you stopped these meds several weeks ago...  Today we checked your follow up labs per your request & we will call you w/ the results when avail next week...  Keep a log of your BP's at home & at work as we discussed...    Call for any questions or if the BP is >150/90 on multiple checks...  Let's plan a follow up visit in 3 months to recheck these numbers.Marland KitchenMarland Kitchen

## 2011-06-04 ENCOUNTER — Encounter: Payer: Self-pay | Admitting: Pulmonary Disease

## 2011-06-04 NOTE — Progress Notes (Signed)
Subjective:    Patient ID: Brenda Russo, female    DOB: 10-28-1955, 56 y.o.   MRN: [id number]  HPI 56 y/o BF here for a follow up visit...  he has multiple medical problems as noted below...    ~  December 11, 2008:  states her breathing has been stable w/ Prn Albut neb & Ventolin HFA... no asthma exac etc... she wants to discuss wt loss programs and asks about HCG diet plan suggested by her GYN... we discussed diet programs in general- calories in, calories out, & gimmicks... she will consider weight watchers & exercise program to get in shape...  ~  November 27, 2009:  she had some transient HBP last spring- saw TP & started Benicar but she doesn't want to have to take meds & stopped on her own... checking BP's at home & states they are OK w/ high 140/90 range- unfortunately BP 170/100+ today in office & she clearly needs BP meds- we discussed this & she has agreed to take generic Hyzaar... she denies CP, palpit, SOB, edema, etc... states breathing is good & still only using the Prn Albuterol inhaler (even though she uses it pretty much every day)- still declines to use controller med like Symbicort (higher co-pay)... CXR is WNL, and Labs are norm, she requested B12 level= 1398... wants letter exempting her from Flu shot due to allergy to eggs.  ~  March 05, 2010:  BP improved on Losartan/HCT & low sodium diet but still sl elev at 150+/90s at home... she is asymptomatic w/o CP, palpit, SOB, edema, etc... we decided to add Amlodipine 5mg /d to her regimen... Otherw stable> notes shoulder pain after fall, using Mobic Prn... uses VentolinHFA Prn.  ~  Jul 02, 2010:  42mo ROV & he breathing is doing very well w/o exac etc;  BP has been well controlled on the Hyzaar/ Norvasc & she decr the Amlodipine 5mg  to 1/2 tab daily;  She notes new GERD/ reflux symptoms at night- denies cough/ choking- but bothered by heartburn & we discussed further eval vs Rx trial w/ OMEP40 at dinnertime, NPO after dinner, elev  HOB 6", & PEPCID Qhs...  She has purchased another supplemental diet system> Complete Nutrition at [facility], Vit & herbal supplements reviewed w/ pt (~$150/mo)...  ~  December 03, 2010:  38mo ROV & CPX> her CC is pain, tender at left thumb IP joint & rec to try hot soaks, Mobic daily vs Advil (offered Ortho- hand eval but she prefers to wait)... States she is ALLERGIC to EGGS & needs form completed to exempt her from Flu vaccines...    AR/ Asthma> on VENTOLIN HFA (& refuses Proair- "it doesn't work for me"); no recent asthma attacks or exac...    HBP> on Amlodipine5- 1/2tab, & Hyzaar100- 1/2tab; BP= 122/72 and prev dizzy resolved; she denies HAs, CP, palpit, ch in SOB, edema...    Chol> on diet alone; LDL has not been at goal & she is encouraged to get on low chol/ low fat diet/ get wt down...    Overweight> wt is down 7# to 212# on diet- "I watch what I eat" and we discussed incr exercise...    DJD> on Mobic as needed    Anxiety> she is coping well & declines anxiolytic Rx...  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  She requests Cortisol level to be checked today as suggested by a nurse friend (BMet & Cortisol were normal)... See prob list below>> LABS 4/13:  BMet- wnl;  Cortisol level (done at pt's request)- normal...   Problem List:    << PROBLEM LIST UPDATED 06/03/11 >>  ALLERGIC RHINITIS (ICD-477.9) - she uses OTC meds as needed.  ASTHMA (ICD-493.90) - only using Ventolin HFA as needed  (she has NEB w/ Albuterol as well)... she hasn't been checking peak flows etc... she notes that she averages one inhalation daily- usually in the AM... she understands that she is not on a controller drug, but she does not wish to change to Symbicort, Advair, Pulmicort, etc (despite my recommendation to start and stay on one of these medications)...   HYPERTENSION (ICD-401.9) - prev on Norvasc & Hyzaar w/ good control of BP, she stopped on her own 4/13... ~  1/12:  BP= 140/94 on Hyzaar100/12.5 & we decided to add NORVASC 5mg /d... ~  5/12:  Pt reports that she decr the Amlodipine 5mg  to1/2 tab daily & BP checks at home have been good (110/80 here today). ~  10/12:  Pt reports that she decr both meds to 1/2tab daily due to dizziness & improved... 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  HYPERCHOLESTEROLEMIA, BORDERLINE (ICD-272.4) - on diet Rx alone...  ~  FLP 12/08 showed TChol 178, TG 35, HDL 58, LDL 113 ~  FLP 11/10 showed TChol 168, TG 45, HDL 57, LDL 102 ~  FLP 10/11 showed TChol 185, TG 49, HDL 57, LDL 118 ~  FLP 10/12 showed TChol 164, TG 30, HDL 59, LDL 99... Great, keep up the good work!  OVERWEIGHT (ICD-278.02) - we discussed diet + exercise program... ~  weight 1/07 = 181# ~  weight 6/09 = 206# ~  weight 11/10 = 213# ~  weight 10/11 = 213# ~  Weight 5/12 = 219#... Despite numerous diet attempts & fads. ~  Weight 10/12 = 212# ~  Weight 4/13 = 216#... We reviewed low carb, low fat diet.  DEGENERATIVE ARTHRITIS>  She has DJD/ OA & uses OTC anti-inflamm meds; prev tried Mobic 7.5mg  Prn...  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 had colonoscopy 7/10 by DrJacobs= neg, no polyps etc... f/u plannned 73yrs.   Past Surgical History  Procedure Date  . Cesarean section     Outpatient Encounter Prescriptions as of  06/03/11 >>  Medication Sig Dispense Refill  . albuterol (  PROVENTIL) (2.5 MG/3ML) 0.083% nebulizer solution Take 2.5 mg by nebulization 3 (three) times daily as needed.        Marland Kitchen albuterol (VENTOLIN HFA) 108 (90 BASE) MCG/ACT inhaler Inhale 2 puffs into the lungs every 4 (four) hours as needed.  3 Inhaler  3  . amLODipine (NORVASC) 5 MG tablet Prev on 1/2 tablet daily & she STOPPED on her own...     OFF NOW    . losartan-hydrochlorothiazide (HYZAAR) 100-12.5 MG per tablet Prev on 1/2 tablet daily & she STOPPED on her own...     OFF NOW    . meloxicam (MOBIC) 7.5 MG tablet Take 7.5 mg by mouth daily as needed.      OFF NOW    . Multiple Vitamins-Minerals (COMPLETE WOMENS PO) Take 1 capsule by mouth 2 (two) times daily.          Allergies  Allergen Reactions  . Eggs Or Egg-Derived Products     Shortness of breath, wheezing    Current Medications, Allergies, Past Medical History, Past Surgical History, Family History, and Social History were reviewed in [employer] record.    Review of Systems      See HPI - all other systems neg except as noted...  The patient denies anorexia, fever, weight loss, weight gain, vision loss, decreased hearing, hoarseness, chest pain, syncope, dyspnea on exertion, peripheral edema, prolonged cough, headaches, hemoptysis, abdominal pain, melena, hematochezia, severe indigestion/heartburn, hematuria, incontinence, muscle weakness, suspicious skin lesions, transient blindness, difficulty walking, depression, unusual weight change, abnormal bleeding, enlarged lymph nodes, and angioedema.   Objective:   Physical Exam     WD, WN,  55 y/o BF in NAD... GENERAL:  Alert & oriented; pleasant & cooperative... HEENT:  [place]/AT, EOM-wnl, PERRLA, EACs-clear, TMs-wnl, NOSE-clear, THROAT-clear & wnl. NECK:  Supple w/ full ROM; no JVD; normal carotid impulses w/o bruits; no thyromegaly or nodules palpated; no lymphadenopathy. CHEST:  Clear to P & A; without wheezes/ rales/ or rhonchi heard... HEART:  Regular Rhythm; without murmurs/ rubs/ or gallops detected... ABDOMEN:  Soft & nontender; normal bowel sounds; no organomegaly or masses palpated... EXT: without deformities or arthritic changes; no varicose veins/ +venous insuffic/ no edema. NEURO:  CN's intact; motor testing normal; sensory testing normal; gait normal & balance OK. DERM:  No lesions noted; no rash etc...   Assessment & Plan:   ASTHMA>  She is clear today, control seems reasonable, using prn inhaler vs Neb...  HBP>  She clearly does not want meds! Stopped both BP meds on her own when she went to 3rd shift job; we reviewed lifestyle mod, no salt, diet efforts & encouraged wt reduction; she will monitor BP & keep a log for [name] to review...  CHOL>  Improved on diet alone w/ FLP at goal;  Needs continued diet & get wt down!!!  OVERWEIGHT>  Wt reduction is key to her BP/ DM/ etc...  GERD>  She is currently denying GERD, reflux symptoms, etc...  DJD>  She uses OTC analgesics as needed...  ANXIETY>  She does not want anxiolytic Rx...   Patient's Medications  New Prescriptions   No medications on file  Previous Medications   ALBUTEROL (PROVENTIL) (2.5 MG/3ML) 0.083% NEBULIZER SOLUTION    Take 2.5 mg by nebulization 3 (three) times daily as needed.     ALBUTEROL (VENTOLIN HFA) 108 (90 BASE) MCG/ACT INHALER    Inhale 2 puffs into the lungs every 4 (four) hours as needed.   MULTIPLE VITAMINS-MINERALS (COMPLETE  WOMENS PO)    Take 1 capsule by mouth 2 (two) times daily.    Modified Medications   No medications on file  Discontinued Medications   AMLODIPINE (NORVASC) 5  MG TABLET    1/2 tablet daily   LOSARTAN-HYDROCHLOROTHIAZIDE (HYZAAR) 100-12.5 MG PER TABLET    Take 1/2 tablet by mouth once daily   MELOXICAM (MOBIC) 7.5 MG TABLET    Take 7.5 mg by mouth daily as needed.

## 2011-08-30 ENCOUNTER — Encounter: Payer: Self-pay | Admitting: Pulmonary Disease

## 2011-08-30 ENCOUNTER — Telehealth: Payer: Self-pay | Admitting: Pulmonary Disease

## 2011-08-30 ENCOUNTER — Ambulatory Visit (INDEPENDENT_AMBULATORY_CARE_PROVIDER_SITE_OTHER): Payer: 59 | Admitting: Pulmonary Disease

## 2011-08-30 VITALS — BP 150/100 | HR 57 | Temp 97.6°F | Ht 65.0 in | Wt 218.6 lb

## 2011-08-30 DIAGNOSIS — E663 Overweight: Secondary | ICD-10-CM

## 2011-08-30 DIAGNOSIS — M1990 Unspecified osteoarthritis, unspecified site: Secondary | ICD-10-CM

## 2011-08-30 DIAGNOSIS — F411 Generalized anxiety disorder: Secondary | ICD-10-CM

## 2011-08-30 DIAGNOSIS — E785 Hyperlipidemia, unspecified: Secondary | ICD-10-CM

## 2011-08-30 DIAGNOSIS — I10 Essential (primary) hypertension: Secondary | ICD-10-CM

## 2011-08-30 DIAGNOSIS — J45909 Unspecified asthma, uncomplicated: Secondary | ICD-10-CM

## 2011-08-30 DIAGNOSIS — J309 Allergic rhinitis, unspecified: Secondary | ICD-10-CM

## 2011-08-30 NOTE — Telephone Encounter (Signed)
I spoke with JJ and she states she did not try to call pt. Looks like alida left message for pt about her appt with Ortho. I gave her this information as alida had documented in the note. Will forward to [facility] box so alida is aware pt already aware of appt.

## 2011-08-30 NOTE — Patient Instructions (Addendum)
Today we updated your med list in our EPIC system...    Continue your current medications the same...  We decided to continue to hold off on meds for your BP while you:    Continue the diet, exercise, wt loss program...    Restrict dietary salt/sodium...    Work on getting a day-shift position at work...  Call for any questions...  Let's plan a recheck in 3 months w/ FASTING blood work at that time.Marland KitchenMarland Kitchen

## 2011-08-30 NOTE — Progress Notes (Signed)
Subjective:    Patient ID: Brenda Russo, female    DOB: Oct 29, 1955, 56 y.o.   MRN: [id number]  HPI 56 y/o BF here for a follow up visit...  he has multiple medical problems as noted below...    ~  December 11, 2008:  states her breathing has been stable w/ Prn Albut neb & Ventolin HFA... no asthma exac etc... she wants to discuss wt loss programs and asks about HCG diet plan suggested by her GYN... we discussed diet programs in general- calories in, calories out, & gimmicks... she will consider weight watchers & exercise program to get in shape...  ~  November 27, 2009:  she had some transient HBP last spring- saw TP & started Benicar but she doesn't want to have to take meds & stopped on her own... checking BP's at home & states they are OK w/ high 140/90 range- unfortunately BP 170/100+ today in office & she clearly needs BP meds- we discussed this & she has agreed to take generic Hyzaar... she denies CP, palpit, SOB, edema, etc... states breathing is good & still only using the Prn Albuterol inhaler (even though she uses it pretty much every day)- still declines to use controller med like Symbicort (higher co-pay)... CXR is WNL, and Labs are norm, she requested B12 level= 1398... wants letter exempting her from Flu shot due to allergy to eggs.  ~  March 05, 2010:  BP improved on Losartan/HCT & low sodium diet but still sl elev at 150+/90s at home... she is asymptomatic w/o CP, palpit, SOB, edema, etc... we decided to add Amlodipine 5mg /d to her regimen... Otherw stable> notes shoulder pain after fall, using Mobic Prn... uses VentolinHFA Prn.  ~  Jul 02, 2010:  669mo ROV & her breathing is doing very well w/o exac etc;  BP has been well controlled on the Hyzaar/ Norvasc & she decr the Amlodipine 5mg  to 1/2 tab daily;  She notes new GERD/ reflux symptoms at night- denies cough/ choking- but bothered by heartburn & we discussed further eval vs Rx trial w/ OMEP40 at dinnertime, NPO after dinner,  elev HOB 6", & PEPCID Qhs...  She has purchased another supplemental diet system> Complete Nutrition at [facility], Vit & herbal supplements reviewed w/ pt (~$150/mo)...  ~  December 03, 2010:  69mo ROV & CPX> her CC is pain, tender at left thumb IP joint & rec to try hot soaks, Mobic daily vs Advil (offered Ortho- hand eval but she prefers to wait)... States she is ALLERGIC to EGGS & needs form completed to exempt her from Flu vaccines...    AR/ Asthma> on VENTOLIN HFA (& refuses Proair- "it doesn't work for me"); no recent asthma attacks or exac...    HBP> on Amlodipine5- 1/2tab, & Hyzaar100- 1/2tab; BP= 122/72 and prev dizzy resolved; she denies HAs, CP, palpit, ch in SOB, edema...    Chol> on diet alone; LDL has not been at goal & she is encouraged to get on low chol/ low fat diet/ get wt down...    Overweight> wt is down 7# to 212# on diet- "I watch what I eat" and we discussed incr exercise...    DJD> on Mobic as needed    Anxiety> she is coping well & declines anxiolytic Rx...  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  She requests Cortisol level to be checked today as suggested by a nurse friend (BMet & Cortisol were normal)... See prob list below>> LABS 4/13:  BMet- wnl;  Cortisol level (done at pt's request)- normal...  ~  August 30, 2011:  49mo ROV & she indicates that she's been doing well & that her home BP checks have been good (although she didn't bring her log of BPs w/ her today); BP  here today is elev at 150/100 confirmed by my recheck & she remains off meds by her preference/ request; she wonders if it's related to the night shift work & he bodies diurnal variation; we discussed no salt, wt reduction, and change back to day shift job;  she is c/o some right knee pain x 68mo w/ decr ROM &ing OTC analgesics as needed...     We reviewed prob list, meds, xrays and labs> see below for updates>>    Problem List:     ALLERGIC RHINITIS (ICD-477.9) - she uses OTC meds as needed.  ASTHMA (ICD-493.90) - only using Ventolin HFA as needed (she has NEB w/ Albuterol as well)... she hasn't been checking peak flows etc... she notes that she averages one inhalation daily- usually in the AM... she understands that she is not on a controller drug, but she does not wish to change to Symbicort, Advair, Pulmicort, etc (despite my recommendation to start and stay on one of these medications)...   HYPERTENSION (ICD-401.9) - prev on Norvasc & Hyzaar w/ good control of BP, she stopped on her own 4/13... ~  1/12:  BP= 140/94 on Hyzaar100/12.5 & we decided to add NORVASC 5mg /d... ~  5/12:  Pt reports that she decr the Amlodipine 5mg  to1/2 tab daily & BP checks at home have been good (110/80 here today). ~  10/12:  Pt reports that she decr both meds to 1/2tab daily due to dizziness & improved... 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 ~  7/13:  BP= 150/100 but she states all BP checks at home have been good; blames it on night shft job 7 trying to go to day job, remionded to elim salt & get wt down!  HYPERCHOLESTEROLEMIA, BORDERLINE (ICD-272.4) - on diet Rx alone...  ~  FLP 12/08 showed  TChol 178, TG 35, HDL 58, LDL 113 ~  FLP 11/10 showed TChol 168, TG 45, HDL 57, LDL 102 ~  FLP 10/11 showed TChol 185, TG 49, HDL 57, LDL 118 ~  FLP 10/12 showed TChol 164, TG 30, HDL 59, LDL 99... Great, keep up the good work!  OVERWEIGHT (ICD-278.02) - we discussed diet + exercise program... ~  weight 1/07 =  181# ~  weight 6/09 = 206# ~  weight 11/10 = 213# ~  weight 10/11 = 213# ~  Weight 5/12 = 219#... Despite numerous diet attempts & fads. ~  Weight 10/12 = 212# ~  Weight 4/13 = 216#... We reviewed low carb, low fat diet. ~  Weight 7/13 = 219#  DEGENERATIVE ARTHRITIS>  She has DJD/ OA & uses OTC anti-inflamm meds; prev tried Mobic 7.5mg  Prn...  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 had colonoscopy 7/10 by DrJacobs= neg, no polyps etc... f/u plannned 50yrs.   Past Surgical History  Procedure Date  . Cesarean section     Outpatient Encounter Prescriptions as of 08/30/2011  Medication Sig Dispense Refill  . albuterol (PROVENTIL) (2.5 MG/3ML) 0.083% nebulizer solution Take 2.5 mg by nebulization 3 (three) times daily as needed.        Marland Kitchen albuterol (VENTOLIN HFA) 108 (90 BASE) MCG/ACT inhaler Inhale 2 puffs into the lungs every 4 (four) hours as needed.  3 Inhaler  3  . CALCIUM PO Take 750 mg by mouth daily.      . Cholecalciferol (VITAMIN D) 1000 UNITS capsule Take 1,000 Units by mouth daily.      . Multiple Vitamins-Minerals (COMPLETE WOMENS PO) Take 1 capsule by mouth 2 (two) times daily.          Allergies  Allergen Reactions  . Eggs Or Egg-Derived Products     Shortness of breath, wheezing    Current Medications, Allergies, Past Medical History, Past Surgical History, Family History, and Social History were reviewed in [employer] record.    Review of Systems      See  HPI - all other systems neg except as noted...  The patient denies anorexia, fever, weight loss, weight gain, vision loss, decreased hearing, hoarseness, chest pain, syncope, dyspnea on exertion, peripheral edema, prolonged cough, headaches, hemoptysis, abdominal pain, melena, hematochezia, severe indigestion/heartburn, hematuria, incontinence, muscle weakness, suspicious skin lesions, transient blindness, difficulty walking, depression, unusual weight change, abnormal bleeding, enlarged lymph nodes, and angioedema.   Objective:   Physical Exam     WD, WN, 55 y/o BF in NAD... GENERAL:  Alert & oriented; pleasant & cooperative... HEENT:  [place]/AT, EOM-wnl, PERRLA, EACs-clear, TMs-wnl, NOSE-clear, THROAT-clear & wnl. NECK:  Supple w/ full ROM; no JVD; normal carotid impulses w/o bruits; no thyromegaly or nodules palpated; no lymphadenopathy. CHEST:  Clear to P & A; without wheezes/ rales/ or rhonchi heard... HEART:  Regular Rhythm; without murmurs/ rubs/ or gallops detected... ABDOMEN:  Soft & nontender; normal bowel sounds; no organomegaly or masses palpated... EXT: without deformities or arthritic changes; no varicose veins/ +venous insuffic/ no edema. NEURO:  CN's intact; motor testing normal; sensory testing normal; gait normal & balance OK. DERM:  No lesions noted; no rash etc...  RADIOLOGY DATA:  Reviewed in the EPIC EMR & discussed w/ the patient...  LABORATORY DATA:  Reviewed in the EPIC EMR & discussed w/ the patient...   Assessment & Plan:   ASTHMA>  She is clear today, control seems reasonable, using prn inhaler vs Neb...  HBP>  She clearly does not want meds! Stopped both BP meds on her own when she went to 3rd shift job; we reviewed lifestyle mod, no salt, diet efforts & encouraged wt reduction; she will monitor BP &  keep a log for [name] to review...  CHOL>  Improved on diet alone w/ FLP at goal;  Needs continued diet & get wt down!!!  OVERWEIGHT>  Wt reduction is key to her BP/  DM/ etc...  GERD>  She is currently denying GERD, reflux symptoms, etc...  DJD>  She uses OTC analgesics as needed; offered Ortho consult when ready...  ANXIETY>  She does not want anxiolytic Rx...   Patient's Medications  New Prescriptions   No medications on file  Previous Medications   ALBUTEROL (PROVENTIL) (2.5 MG/3ML) 0.083% NEBULIZER SOLUTION    Take 2.5 mg by nebulization 3 (three) times daily as needed.     ALBUTEROL (VENTOLIN HFA) 108 (90 BASE) MCG/ACT INHALER    Inhale 2 puffs into the lungs every 4 (four) hours as needed.   CALCIUM PO    Take 750 mg by mouth daily.   CHOLECALCIFEROL (VITAMIN D) 1000 UNITS CAPSULE    Take 1,000 Units by mouth daily.   MULTIPLE VITAMINS-MINERALS (COMPLETE WOMENS PO)    Take 1 capsule by mouth 2 (two) times daily.    Modified Medications   No medications on file  Discontinued Medications   No medications on file

## 2011-09-02 ENCOUNTER — Ambulatory Visit: Payer: 59 | Admitting: Pulmonary Disease

## 2011-11-29 ENCOUNTER — Encounter: Payer: Self-pay | Admitting: *Deleted

## 2011-11-30 ENCOUNTER — Encounter: Payer: Self-pay | Admitting: Pulmonary Disease

## 2011-11-30 ENCOUNTER — Other Ambulatory Visit (INDEPENDENT_AMBULATORY_CARE_PROVIDER_SITE_OTHER): Payer: 59

## 2011-11-30 ENCOUNTER — Ambulatory Visit (INDEPENDENT_AMBULATORY_CARE_PROVIDER_SITE_OTHER): Payer: 59 | Admitting: Pulmonary Disease

## 2011-11-30 VITALS — BP 144/92 | HR 64 | Temp 98.0°F | Ht 65.0 in | Wt 224.8 lb

## 2011-11-30 DIAGNOSIS — E663 Overweight: Secondary | ICD-10-CM

## 2011-11-30 DIAGNOSIS — R5383 Other fatigue: Secondary | ICD-10-CM

## 2011-11-30 DIAGNOSIS — F411 Generalized anxiety disorder: Secondary | ICD-10-CM

## 2011-11-30 DIAGNOSIS — E785 Hyperlipidemia, unspecified: Secondary | ICD-10-CM

## 2011-11-30 DIAGNOSIS — M1990 Unspecified osteoarthritis, unspecified site: Secondary | ICD-10-CM

## 2011-11-30 DIAGNOSIS — J45909 Unspecified asthma, uncomplicated: Secondary | ICD-10-CM

## 2011-11-30 DIAGNOSIS — R5381 Other malaise: Secondary | ICD-10-CM

## 2011-11-30 DIAGNOSIS — J309 Allergic rhinitis, unspecified: Secondary | ICD-10-CM

## 2011-11-30 LAB — BASIC METABOLIC PANEL
CO2: 29 mEq/L (ref 19–32)
Chloride: 106 mEq/L (ref 96–112)
Glucose, Bld: 84 mg/dL (ref 70–99)
Sodium: 142 mEq/L (ref 135–145)

## 2011-11-30 LAB — CBC WITH DIFFERENTIAL/PLATELET
Basophils Absolute: 0 10*3/uL (ref 0.0–0.1)
Eosinophils Absolute: 0.3 10*3/uL (ref 0.0–0.7)
Lymphocytes Relative: 30.1 % (ref 12.0–46.0)
MCHC: 32.7 g/dL (ref 30.0–36.0)
Neutrophils Relative %: 57.9 % (ref 43.0–77.0)
Platelets: 269 10*3/uL (ref 150.0–400.0)
RDW: 14.7 % — ABNORMAL HIGH (ref 11.5–14.6)

## 2011-11-30 LAB — HEPATIC FUNCTION PANEL
ALT: 17 U/L (ref 0–35)
Alkaline Phosphatase: 82 U/L (ref 39–117)
Bilirubin, Direct: 0.1 mg/dL (ref 0.0–0.3)
Total Protein: 7.3 g/dL (ref 6.0–8.3)

## 2011-11-30 LAB — LIPID PANEL
Total CHOL/HDL Ratio: 4
Triglycerides: 46 mg/dL (ref 0.0–149.0)

## 2011-11-30 MED ORDER — ALBUTEROL SULFATE HFA 108 (90 BASE) MCG/ACT IN AERS: 2.0000 | INHALATION_SPRAY | RESPIRATORY_TRACT | Status: DC | PRN

## 2011-11-30 MED ORDER — ALBUTEROL SULFATE (2.5 MG/3ML) 0.083% IN NEBU: 2.5000 mg | INHALATION_SOLUTION | Freq: Three times a day (TID) | RESPIRATORY_TRACT | Status: DC | PRN

## 2011-11-30 NOTE — Patient Instructions (Addendum)
Today we updated your med list in our EPIC system...    Continue your current medications the same...  Today we did your follow up FASTING blood work...    We will communicate the results to you when available...  Per your request, we will arrange for an Endocrine evaluation of your thyroid gland...  Call for any questions or if we can be of service in any way.Marland KitchenMarland Kitchen

## 2011-11-30 NOTE — Progress Notes (Signed)
Subjective:    Patient ID: Brenda Russo, female    DOB: [birth year], 56 y.o.   MRN: [id number]  HPI 56 y/o BF here for a follow up visit...  he has multiple medical problems as noted below...    ~  Jul 02, 2010:  61mo ROV & her breathing is doing very well w/o exac etc;  BP has been well controlled on the Hyzaar/ Norvasc & she decr the Amlodipine 5mg  to 1/2 tab daily;  She notes new GERD/ reflux symptoms at night- denies cough/ choking- but bothered by heartburn & we discussed further eval vs Rx trial w/ [id number] at dinnertime, NPO after dinner, elev HOB 6", & PEPCID Qhs...  She has purchased another supplemental diet system> Complete Nutrition at [facility], Vit & herbal supplements reviewed w/ pt (~$150/mo)...  ~  December 03, 2010:  20mo ROV & CPX> her CC is pain, tender at left thumb IP joint & rec to try hot soaks, Mobic daily vs Advil (offered Ortho- hand eval but she prefers to wait)... States she is ALLERGIC to EGGS & needs form completed to exempt her from Flu vaccines...    AR/ Asthma> on VENTOLIN HFA (& refuses Proair- "it doesn't work for me"); no recent asthma attacks or exac...    HBP> on Amlodipine5- 1/2tab, & Hyzaar100- 1/2tab; BP= 122/72 and prev dizzy resolved; she denies HAs, CP, palpit, ch in SOB, edema...    Chol> on diet alone; LDL has not been at goal & she is encouraged to get on low chol/ low fat diet/ get wt down...    Overweight> wt is down 7# to 212# on diet- "I watch what I eat" and we discussed incr exercise...    DJD> on Mobic as needed    Anxiety> she is coping well & declines anxiolytic Rx...  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  She requests Cortisol level to be checked today as suggested by a nurse friend (BMet & Cortisol were normal)... See prob list below>> LABS 4/13:  BMet- wnl;  Cortisol level (done at pt's request)- normal...  ~  August 30, 2011:  12mo ROV & she indicates that she's been doing well & that her home BP checks have been good (although she didn't bring her log of BPs w/ her today); BP here today is elev at 150/100 confirmed by my recheck & she remains off meds by her preference/ request; she wonders if it's related to the night shift work & he bodies diurnal variation; we discussed no salt, wt reduction, and change back to day shift job;  she is c/o some right knee pain x 61mo w/ decr ROM &ing OTC analgesics as needed...     We reviewed prob list, meds, xrays and labs> see below for updates>>  ~  November 30, 2011:  12mo ROV & [name] is c/o fatigue, tired  all the time, etc (she works 7P-7A at [employer]) and did some Copy & she tells me that "DrWiggy" says its hypothyroidism & she needs a ReverseT3 done, plus she has started taking Iodine pills; I told her that ALL of her prev Thyroid blood tests have been normal & she is not hypothyroid & she should stop the Iodine & doesn't need a ReverseT3- see requests Endocrine consult & we will refer per her request...    AR/ Asthma> on VENTOLIN HFA prn (& refuses Proair- "it doesn't work for me"); no recent asthma attacks or exac...    HBP> prev on Amlodipine & Hyzaar; BP= 144/92 and prev dizzy resolved; she denies HAs, CP, palpit, ch in SOB, edema...    Chol> on diet alone; FLP showed TChol 192, TG 46, HDL 52, LDL 131; she refuses med Rx again & we discussed a low chol/ low fat diet...    Overweight> wt is up 7# to 225# - "I watch what I eat" and we discussed incr exercise...    DJD>  on Mobic as needed    Anxiety> she is coping well & continues to decline meds... We reviewed prob list, meds, xrays and labs> see below for updates >> she refuses Flu vaccine, requests refill of all meds today... LABS 10/13:  FLP- not at goal on diet w/ LDL=131;  Chems- wnl;  CBC- wnl;  TSH=2.26;  VitD= 29...   Problem List:     ALLERGIC RHINITIS (ICD-477.9) - she uses OTC meds as needed.  ASTHMA (ICD-493.90) - only using Ventolin HFA as needed (she has NEB w/ Albuterol as well)... she hasn't been checking peak flows etc... she notes that she averages one inhalation daily- usually in the AM... she understands that she is not on a controller drug, but she does not wish to change to Symbicort, Advair, Pulmicort, etc (despite my recommendation to start and stay on one of these medications)...   HYPERTENSION (ICD-401.9) - prev on Norvasc & Hyzaar w/ good control of BP, she stopped on her own 4/13... ~  1/12:  BP= 140/94 on Hyzaar100/12.5 & we decided to add NORVASC 5mg /d... ~  5/12:  Pt reports that she decr the Amlodipine 5mg  to1/2 tab daily & BP checks at home have been good (110/80 here today). ~  10/12:  Pt reports that she decr both meds to 1/2tab daily due to dizziness & improved... 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 ~  7/13:  BP= 150/100 but she states all BP checks at home have been good; blames it on night shft job & trying to go to day job, remionded to elim salt & get wt down! ~  10/13:  BP= 144/92 & she is fatigued; denies CP, palpit, SOB, edema...  HYPERCHOLESTEROLEMIA, BORDERLINE (ICD-272.4) - on diet Rx alone... She refuses med rx for her lipids. ~  FLP 12/08  showed TChol 178, TG 35, HDL 58, LDL 113 ~  FLP 11/10 showed TChol 168, TG 45, HDL 57, LDL 102 ~  FLP 10/11 showed TChol 185, TG 49, HDL 57, LDL 118 ~  FLP 10/12 showed  TChol 164, TG 30, HDL 59, LDL 99... Great, keep up the good work! ~  FLP 10/13 showed TChol 192, TG 46, HDL 52, LDL 136... She refuses meds, offered Lipid clinic.  OVERWEIGHT (ICD-278.02) - we discussed diet + exercise program... ~  weight 1/07 = 181# ~  weight 6/09 = 206# ~  weight 11/10 = 213# ~  weight 10/11 = 213# ~  Weight 5/12 = 219#... Despite numerous diet attempts & fads. ~  Weight 10/12 = 212# ~  Weight 4/13 = 216#... We reviewed low carb, low fat diet. ~  Weight 7/13 = 219# ~  Weight 10/13 = 225#  THYROID >> She presented 10/13 c/o being fatigued, cold, etc & she read on the internet that this could be hypothyroidism; I explained to her that her thyroid function as been normal (normal TSH) & this rules out hypothy, but she rejects this, says she read on the internet from "DrWiggy" that she needs a ReverseT3 checked but I declined to order this test;  She wants to have an Endocrine consult regarding her thyroid & we will refer per her request... ~  Labs 10/13 showed TSH= 2.26; Thyroid exam is neg- no goiter, no nodules; she started taking Iodine on her own & I advised her to stop...  DEGENERATIVE ARTHRITIS>  She has DJD/ OA & uses OTC anti-inflamm meds; prev tried Mobic 7.5mg  Prn...  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  GENERAL HEALTH:  she sees DrCousins for GYN & she says up to date on PAP, Mammograms, etc... had colonoscopy 7/10 by DrJacobs= neg, no polyps etc... f/u plannned 83yrs.   Past Surgical History  Procedure Date  . Cesarean section     Outpatient Encounter Prescriptions as of 11/30/2011  Medication Sig Dispense Refill  . albuterol (PROVENTIL) (2.5 MG/3ML) 0.083% nebulizer solution Take 2.5  mg by nebulization 3 (three) times daily as needed.        Marland Kitchen albuterol (VENTOLIN HFA) 108 (90 BASE) MCG/ACT inhaler Inhale 2 puffs into the lungs every 4 (four) hours as needed.  3 Inhaler  3  . CALCIUM PO Take 750 mg by mouth daily.      . Cholecalciferol (VITAMIN D) 1000 UNITS capsule Take 1,000 Units by mouth daily.      . Multiple Vitamins-Minerals (COMPLETE WOMENS PO) Take 1 capsule by mouth 2 (two) times daily.        . NON FORMULARY Tri- iodine 1 daily--otc medication        Allergies  Allergen Reactions  . Eggs Or Egg-Derived Products     Shortness of breath, wheezing    Current Medications, Allergies, Past Medical History, Past Surgical History, Family History, and Social History were reviewed in [employer] record.    Review of Systems      See HPI - all other systems neg except as noted...  The patient denies anorexia, fever, weight loss, weight gain, vision loss, decreased hearing, hoarseness, chest pain, syncope, dyspnea on exertion, peripheral edema, prolonged cough, headaches, hemoptysis, abdominal pain, melena, hematochezia, severe indigestion/heartburn, hematuria, incontinence, muscle weakness, suspicious skin lesions, transient blindness, difficulty walking, depression, unusual weight change, abnormal bleeding, enlarged lymph nodes, and angioedema.   Objective:   Physical Exam     WD, WN, 56 y/o BF in NAD... GENERAL:  Alert & oriented; pleasant & cooperative... HEENT:  [place]/AT, EOM-wnl, PERRLA, EACs-clear, TMs-wnl, NOSE-clear, THROAT-clear &  wnl. NECK:  Supple w/ full ROM; no JVD; normal carotid impulses w/o bruits; no thyromegaly or nodules palpated; no lymphadenopathy. CHEST:  Clear to P & A; without wheezes/ rales/ or rhonchi heard... HEART:  Regular Rhythm; without murmurs/ rubs/ or gallops detected... ABDOMEN:  Soft & nontender; normal bowel sounds; no organomegaly or masses palpated... EXT: without deformities or arthritic changes; no  varicose veins/ +venous insuffic/ no edema. NEURO:  CN's intact; motor testing normal; sensory testing normal; gait normal & balance OK. DERM:  No lesions noted; no rash etc...  RADIOLOGY DATA:  Reviewed in the EPIC EMR & discussed w/ the patient...  LABORATORY DATA:  Reviewed in the EPIC EMR & discussed w/ the patient...   Assessment & Plan:    ASTHMA>  She is clear today, control seems reasonable, using prn inhaler vs Neb...  HBP>  She clearly does not want meds! Stopped both BP meds on her own when she went to 3rd shift job; we reviewed lifestyle mod, no salt, diet efforts & encouraged wt reduction; she will monitor BP & keep a log for [name] to review... Advised that she MUST lose wt & elim salt to get her wt down...  CHOL>  on diet alone w/ FLP not at goal;  Needs better diet & get wt down!!!  OVERWEIGHT>  Wt reduction is key to her BP/ DM/ etc...  GERD>  She is currently denying GERD, reflux symptoms, etc...  DJD>  She uses OTC analgesics as needed; offered Ortho consult when ready...  ANXIETY>  She does not want anxiolytic Rx...   Patient's Medications  New Prescriptions   No medications on file  Previous Medications   CALCIUM PO    Take 750 mg by mouth daily.   CHOLECALCIFEROL (VITAMIN D) 1000 UNITS CAPSULE    Take 1,000 Units by mouth daily.   MULTIPLE VITAMINS-MINERALS (COMPLETE WOMENS PO)    Take 1 capsule by mouth 2 (two) times daily.     NON FORMULARY    Tri- iodine 1 daily--otc medication  Modified Medications   Modified Medication Previous Medication   ALBUTEROL (PROVENTIL) (2.5 MG/3ML) 0.083% NEBULIZER SOLUTION albuterol (PROVENTIL) (2.5 MG/3ML) 0.083% nebulizer solution      Take 3 mLs (2.5 mg total) by nebulization 3 (three) times daily as needed.    Take 2.5 mg by nebulization 3 (three) times daily as needed.     ALBUTEROL (VENTOLIN HFA) 108 (90 BASE) MCG/ACT INHALER albuterol (VENTOLIN HFA) 108 (90 BASE) MCG/ACT inhaler      Inhale 2 puffs into the lungs every  4 (four) hours as needed.    Inhale 2 puffs into the lungs every 4 (four) hours as needed.  Discontinued Medications   No medications on file

## 2011-12-01 LAB — VITAMIN D 25 HYDROXY (VIT D DEFICIENCY, FRACTURES): Vit D, 25-Hydroxy: 29 ng/mL — ABNORMAL LOW (ref 30–89)

## 2011-12-01 LAB — TSH: TSH: 2.26 u[IU]/mL (ref 0.35–5.50)

## 2012-02-17 ENCOUNTER — Encounter: Payer: Self-pay | Admitting: Pulmonary Disease

## 2012-04-26 ENCOUNTER — Telehealth: Payer: Self-pay | Admitting: Pulmonary Disease

## 2012-04-26 NOTE — Telephone Encounter (Signed)
Called and spoke with pt and she stated that she will have to call us back when she is ready to set this up.  She stated that she is working 3rd shift and just not able to set this up at this time for a sleep study.  Pt is aware that Dr. Sharl Ma suggested this to SN and when the pt is ready to schedule the sleep study she will call back.

## 2012-05-20 ENCOUNTER — Ambulatory Visit (INDEPENDENT_AMBULATORY_CARE_PROVIDER_SITE_OTHER): Payer: 59 | Admitting: Family Medicine

## 2012-05-20 VITALS — BP 153/87 | HR 54 | Temp 97.7°F | Resp 18 | Ht 65.0 in | Wt 220.0 lb

## 2012-05-20 DIAGNOSIS — R519 Headache, unspecified: Secondary | ICD-10-CM

## 2012-05-20 DIAGNOSIS — R51 Headache: Secondary | ICD-10-CM

## 2012-05-20 DIAGNOSIS — S139XXA Sprain of joints and ligaments of unspecified parts of neck, initial encounter: Secondary | ICD-10-CM

## 2012-05-20 DIAGNOSIS — D649 Anemia, unspecified: Secondary | ICD-10-CM

## 2012-05-20 DIAGNOSIS — S161XXA Strain of muscle, fascia and tendon at neck level, initial encounter: Secondary | ICD-10-CM

## 2012-05-20 DIAGNOSIS — M542 Cervicalgia: Secondary | ICD-10-CM

## 2012-05-20 LAB — POCT CBC
HCT, POC: 38.5 % (ref 37.7–47.9)
Hemoglobin: 12 g/dL — AB (ref 12.2–16.2)
MCH, POC: 26.9 pg — AB (ref 27–31.2)
MPV: 10.1 fL (ref 0–99.8)
POC MID %: 10.3 %M (ref 0–12)
RBC: 4.46 M/uL (ref 4.04–5.48)
WBC: 8 10*3/uL (ref 4.6–10.2)

## 2012-05-20 LAB — IBC PANEL
TIBC: 303 ug/dL (ref 250–470)
UIBC: 215 ug/dL (ref 125–400)

## 2012-05-20 MED ORDER — CYCLOBENZAPRINE HCL 5 MG PO TABS: ORAL_TABLET | ORAL | Status: DC

## 2012-05-20 MED ORDER — NAPROXEN 500 MG PO TABS: 500.0000 mg | ORAL_TABLET | Freq: Two times a day (BID) | ORAL | Status: DC

## 2012-05-20 NOTE — Progress Notes (Signed)
[address]   [place], [place]  [address]   [phone]  Subjective:    Patient ID: Brenda Russo, female    DOB: 1955/08/16, 57 y.o.   MRN: [id number]  HPI This 57 y.o. female presents for evaluation of R neck pain, swelling.  Also suffering with pressure on R side of face. Feels swollen and tender.  No sore throat.  Mild discomfort with swallowing.  No teeth pain.  Radiates into R side of face and into R ear.  No n/t or burning on side of face.  No vision changes; no new tinnitus.  No dizziness; normal hearing.  No rash.  Rare cough.  Onset right after coughing.  No malaise or fatigue.  No medication other than Aleve for pain; took yesterday; taking once per day.  Severity 6/10.  No nighttime awakening.  Turning head makes pain worse.     Review of Systems  Constitutional: Negative for fever, chills, diaphoresis and fatigue.  HENT: Positive for ear pain and neck pain. Negative for hearing loss, sore throat, facial swelling, rhinorrhea, drooling, mouth sores, trouble swallowing, dental problem, voice change, postnasal drip, sinus pressure and tinnitus.   Respiratory: Negative for shortness of breath.   Skin: Negative for rash.  Neurological: Negative for facial asymmetry, speech difficulty, weakness, light-headedness, numbness and headaches.  Hematological: Positive for adenopathy.        Past Medical History  Diagnosis Date  . Allergic rhinitis   . Asthma   . Essential hypertension   . Other and unspecified hyperlipidemia     borderline  . Overweight   . Plantar wart     history  . Anxiety     Past Surgical History  Procedure Laterality Date  . Cesarean section      Prior to Admission medications   Medication Sig Start Date End Date Taking? Authorizing Provider  albuterol (PROVENTIL) (2.5 MG/3ML) 0.083% nebulizer solution Take 3 mLs (2.5 mg total) by nebulization 3 (three) times daily as needed. 11/30/11  Yes Michele Mcalpine, MD  albuterol (VENTOLIN HFA) 108 (90  BASE) MCG/ACT inhaler Inhale 2 puffs into the lungs every 4 (four) hours as needed. 11/30/11  Yes Michele Mcalpine, MD  CALCIUM PO Take 750 mg by mouth daily.   Yes Historical Provider, MD  Cholecalciferol (VITAMIN D) 1000 UNITS capsule Take 1,000 Units by mouth daily.   Yes Historical Provider, MD  Multiple Vitamins-Minerals (COMPLETE WOMENS PO) Take 1 capsule by mouth 2 (two) times daily.     Yes Historical Provider, MD  NON FORMULARY Tri- iodine 1 daily--otc medication    Historical Provider, MD    Allergies  Allergen Reactions  . Eggs Or Egg-Derived Products     Shortness of breath, wheezing    History   Social History  . Marital Status: Married    Spouse Name: N/A    Number of Children: 7  . Years of Education: N/A   Occupational History  .  [employer]    Therapeutic recreation   Social History Main Topics  . Smoking status: Never Smoker   . Smokeless tobacco: Never Used  . Alcohol Use: No  . Drug Use: No  . Sexually Active: Not Currently   Other Topics Concern  . Not on file   Social History Narrative   Non smoker   Exposed to second hand smoke at times   Exercises 1-2 times per week   No caffeine use   No alcohol use  Married   3 biological children   1 adopted child   3 step-children          Family History  Problem Relation Age of Onset  . Pancreatic cancer Brother   . Breast cancer      Aunt  . Heart disease Mother   . Asthma Father     Objective:   Physical Exam  Nursing note and vitals reviewed. Constitutional: She is oriented to person, place, and time. She appears well-developed and well-nourished. No distress.  HENT:  Head: Normocephalic and atraumatic.  Right Ear: External ear normal.  Left Ear: External ear normal.  Nose: Nose normal.  Mouth/Throat: Oropharynx is clear and moist.  No parotid TTP.  No parotid swelling.  No cervical LAD.  No temple TTP.  Eyes: Conjunctivae and EOM are normal. Pupils are equal, round, and reactive to  light.  Neck: Normal range of motion. Neck supple. No JVD present. No tracheal deviation present. No thyromegaly present.  Cardiovascular: Normal rate, regular rhythm and normal heart sounds.  Exam reveals no gallop and no friction rub.   No murmur heard. Pulmonary/Chest: Effort normal and breath sounds normal. No stridor. No respiratory distress. She has no wheezes. She has no rales.  Musculoskeletal:       Cervical back: She exhibits tenderness. She exhibits normal range of motion and no bony tenderness.  CERVICAL SPINE: NO MIDLINE TTP; MILD TTP R LATERAL NECK; FULL ROM OF NECK.  Lymphadenopathy:    She has no cervical adenopathy.  Neurological: She is alert and oriented to person, place, and time. No cranial nerve deficit. She exhibits normal muscle tone. Coordination normal.  Skin: Skin is warm and dry. No rash noted. She is not diaphoretic.  Psychiatric: She has a normal mood and affect. Her behavior is normal.       Results for orders placed in visit on 05/20/12  POCT CBC      Result Value Range   WBC 8.0  4.6 - 10.2 K/uL   Lymph, poc 2.0  0.6 - 3.4   POC LYMPH PERCENT 25.3  10 - 50 %L   MID (cbc) 0.8  0 - 0.9   POC MID % 10.3  0 - 12 %M   POC Granulocyte 5.2  2 - 6.9   Granulocyte percent 64.4  37 - 80 %G   RBC 4.46  4.04 - 5.48 M/uL   Hemoglobin 12.0 (*) 12.2 - 16.2 g/dL   HCT, POC 16.1  09.6 - 47.9 %   MCV 86.4  80 - 97 fL   MCH, POC 26.9 (*) 27 - 31.2 pg   MCHC 31.2 (*) 31.8 - 35.4 g/dL   RDW, POC 04.5     Platelet Count, POC 265  142 - 424 K/uL   MPV 10.1  0 - 99.8 fL    Assessment & Plan:  Facial pain - Plan: POCT CBC, POCT SEDIMENTATION RATE  Neck pain - Plan: POCT CBC, POCT SEDIMENTATION RATE  Neck strain, initial encounter - Plan: naproxen (NAPROSYN) 500 MG tablet, cyclobenzaprine (FLEXERIL) 5 MG tablet  Anemia - Plan: Ferritin, IBC Panel, Iron   1. Facial/neck pain:  New. Onset after coughing; benign exam. Obtain ESR.  Treat as strain. 2.  Facial/neck  strain R:  New.  Benign exam; history not consistent with trigeminal neuralgia, temporal arteritis, herpes zoster, or parotid gland pathology. Treat as muscular strain with Naprosyn, Flexeril.  RTC for development of swelling, numbness, tingling, weakness. 3.  Anemia:  New.  Colonoscopy UTD at age 20. Last Hgb of 14.0 in 11/2011; advised to follow-up with PCP in upcoming month for repeat labs.    Meds ordered this encounter  Medications  . naproxen (NAPROSYN) 500 MG tablet    Sig: Take 1 tablet (500 mg total) by mouth 2 (two) times daily with a meal.    Dispense:  30 tablet    Refill:  0  . cyclobenzaprine (FLEXERIL) 5 MG tablet    Sig: 1 tablet at bedtime for neck pain    Dispense:  30 tablet    Refill:  0

## 2012-05-20 NOTE — Patient Instructions (Addendum)
Facial pain - Plan: POCT CBC, POCT SEDIMENTATION RATE  Neck pain - Plan: POCT CBC, POCT SEDIMENTATION RATE  Neck strain, initial encounter - Plan: naproxen (NAPROSYN) 500 MG tablet, cyclobenzaprine (FLEXERIL) 5 MG tablet

## 2012-07-05 ENCOUNTER — Encounter: Payer: Self-pay | Admitting: Pulmonary Disease

## 2012-07-05 ENCOUNTER — Other Ambulatory Visit (INDEPENDENT_AMBULATORY_CARE_PROVIDER_SITE_OTHER): Payer: 59

## 2012-07-05 ENCOUNTER — Ambulatory Visit (INDEPENDENT_AMBULATORY_CARE_PROVIDER_SITE_OTHER): Payer: 59 | Admitting: Pulmonary Disease

## 2012-07-05 VITALS — BP 150/100 | HR 86 | Temp 98.0°F | Ht 65.0 in | Wt 221.6 lb

## 2012-07-05 DIAGNOSIS — G4733 Obstructive sleep apnea (adult) (pediatric): Secondary | ICD-10-CM

## 2012-07-05 DIAGNOSIS — R1032 Left lower quadrant pain: Secondary | ICD-10-CM

## 2012-07-05 DIAGNOSIS — E663 Overweight: Secondary | ICD-10-CM

## 2012-07-05 DIAGNOSIS — J309 Allergic rhinitis, unspecified: Secondary | ICD-10-CM

## 2012-07-05 DIAGNOSIS — I10 Essential (primary) hypertension: Secondary | ICD-10-CM

## 2012-07-05 DIAGNOSIS — J45909 Unspecified asthma, uncomplicated: Secondary | ICD-10-CM

## 2012-07-05 DIAGNOSIS — E785 Hyperlipidemia, unspecified: Secondary | ICD-10-CM

## 2012-07-05 DIAGNOSIS — M1990 Unspecified osteoarthritis, unspecified site: Secondary | ICD-10-CM

## 2012-07-05 LAB — BASIC METABOLIC PANEL
Calcium: 8.6 mg/dL (ref 8.4–10.5)
Creatinine, Ser: 0.7 mg/dL (ref 0.4–1.2)
GFR: 107.91 mL/min (ref >60.00)

## 2012-07-05 LAB — HEPATIC FUNCTION PANEL
Bilirubin, Direct: 0.1 mg/dL (ref 0.0–0.3)
Total Bilirubin: 0.9 mg/dL (ref 0.3–1.2)
Total Protein: 7.3 g/dL (ref 6.0–8.3)

## 2012-07-05 LAB — CBC WITH DIFFERENTIAL/PLATELET
Basophils Relative: 0.6 % (ref 0.0–3.0)
Eosinophils Absolute: 0.2 10*3/uL (ref 0.0–0.7)
Hemoglobin: 14.1 g/dL (ref 12.0–15.0)
Lymphocytes Relative: 27.1 % (ref 12.0–46.0)
MCHC: 33.7 g/dL (ref 30.0–36.0)
MCV: 83.9 fl (ref 78.0–100.0)
Neutro Abs: 5.2 10*3/uL (ref 1.4–7.7)
RBC: 4.97 Mil/uL (ref 3.87–5.11)

## 2012-07-05 MED ORDER — DICYCLOMINE HCL 10 MG PO CAPS: 10.0000 mg | ORAL_CAPSULE | Freq: Three times a day (TID) | ORAL | Status: DC

## 2012-07-05 NOTE — Patient Instructions (Addendum)
Today we updated your med list in our EPIC system...    Continue your current medications the same...  We wrote a new prescription for BENTYL 10mg - take one tab up to 3 times daily as needed for abd discomfort...  We will arrange for a CT Scan of your abdomen for further eval...  We will also sched the sleep Study as we discussed...  Today we did your follow up blood work...    We will contact you w/ the results when available...   Call for any questions...  Let's plan a follow up visit in 19mo, sooner if needed for problems.Marland KitchenMarland Kitchen

## 2012-07-06 ENCOUNTER — Encounter: Payer: Self-pay | Admitting: Pulmonary Disease

## 2012-07-06 NOTE — Progress Notes (Signed)
Quick Note:  Spoke with pt and notified of results per Dr. Nadel. Pt verbalized understanding and denied any questions.  ______ 

## 2012-07-06 NOTE — Progress Notes (Signed)
Subjective:    Patient ID: Brenda Russo, female    DOB: 10/01/1955, 57 y.o.   MRN: [id number]  HPI 57 y/o BF here for a follow up visit...  he has multiple medical problems as noted below...    ~  December 03, 2010:  35mo ROV & CPX> her CC is pain, tender at left thumb IP joint & rec to try hot soaks, Mobic daily vs Advil (offered Ortho- hand eval but she prefers to wait)... States she is ALLERGIC to EGGS & needs form completed to exempt her from Flu vaccines...    AR/ Asthma> on VENTOLIN HFA (& refuses Proair- "it doesn't work for me"); no recent asthma attacks or exac...    HBP> on Amlodipine5- 1/2tab, & Hyzaar100- 1/2tab; BP= 122/72 and prev dizzy resolved; she denies HAs, CP, palpit, ch in SOB, edema...    Chol> on diet alone; LDL has not been at goal & she is encouraged to get on low chol/ low fat diet/ get wt down...    Overweight> wt is down 7# to 212# on diet- "I watch what I eat" and we discussed incr exercise...    DJD> on Mobic as needed    Anxiety> she is coping well & declines anxiolytic Rx...  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  She requests Cortisol level to be checked today as suggested by a nurse friend (BMet & Cortisol were normal)... See prob list below>> LABS 4/13:  BMet- wnl;  Cortisol level (done at pt's request)- normal...  ~  August 30, 2011:  44mo ROV & she indicates that she's been doing well & that her home BP checks have been good (although she didn't bring her log of  BPs w/ her today); BP here today is elev at 150/100 confirmed by my recheck & she remains off meds by her preference/ request; she wonders if it's related to the night shift work & he bodies diurnal variation; we discussed no salt, wt reduction, and change back to day shift job;  she is c/o some right knee pain x 30mo w/ decr ROM &ing OTC analgesics as needed...     We reviewed prob list, meds, xrays and labs> see below for updates>>  ~  November 30, 2011:  44mo ROV & Brenda Russo is c/o fatigue, tired all the time, etc (she works 7P-7A at [employer]) and did some Copy & she tells me that "DrWiggy" says its hypothyroidism & she needs a ReverseT3 done, plus she has started taking Iodine pills; I told her that ALL of her prev Thyroid blood tests have been normal & she is not hypothyroid & she should stop the Iodine & doesn't need a ReverseT3- see requests Endocrine consult & we will refer per her request...    AR/ Asthma> on VENTOLIN HFA prn (& refuses Proair- "it doesn't work for me"); no recent  asthma attacks or exac...    HBP> prev on Amlodipine & Hyzaar; BP= 144/92 and prev dizzy resolved; she denies HAs, CP, palpit, ch in SOB, edema...    Chol> on diet alone; FLP showed TChol 192, TG 46, HDL 52, LDL 131; she refuses med Rx again & we discussed a low chol/ low fat diet...    Overweight> wt is up 7# to 225# - "I watch what I eat" and we discussed incr exercise...    DJD> on Mobic as needed    Anxiety> she is coping well & continues to decline meds... We reviewed prob list, meds, xrays and labs> see below for updates >> she refuses Flu vaccine, requests refill of all meds today... LABS 10/13:  FLP- not at goal on diet w/ LDL=131;  Chems- wnl;  CBC- wnl;  TSH=2.26;  VitD= 29...  ~  Jul 05, 2012:  32mo ROV & Brenda Russo tells me she went to [facility] 4/14 w/ right sided neck pain & facial pressure (?occured after coughing she says); Note reviewed- exam was neg; no XRays done; she was told she was anemic (Hg=12.0  MCV=86) but Iron was ok (Fe=88 29%sat & Ferritin=80)... Today she says "so-so" but the right sided neck/ facial symptoms have resolved and she is now c/o LLQ abd pain, intermittent- ?crampy pain, denies n/v c/d blood seen;  Last colonoscopy was 7/10 by DrJacobs= normal colon, no abn seen;  Last Gyn check was about 39yrs ago she says & due for f/u;  We discussed further eval for Abd Pain w/ CT Abd&Pelvis...    R/O OSA> she notes snoring, tired all the time, is in need of a Sleep Study & we will sched this => pending    AR/ Asthma> on VENTOLIN HFA prn (& refuses Proair- "it doesn't work for me"); no recent asthma attacks or exac...    HBP> prev on Amlodipine & Hyzaar; BP= 150/100 today but woks 3rd shift, home BP checks all wnl & ave 120s/70s she says; she denies HAs, CP, palpit, ch in SOB, edema...    Chol> on diet alone; FLP 10/13 showed TChol 192, TG 46, HDL 52, LDL 131; she refuses med Rx again & we discussed a low chol/ low fat diet...    Thyroid/ Endocrine> she is s/p thorough endocrine eval by DrKerr 2/14- everything was normal, no endocrinopathy found; he rec sleep study & Psyche referral...    Overweight> wt is down 2# to 222# - "I watch what I eat" and we discussed incr exercise...    GI- Abd pain 5/14> SEE ABOVE- c/o LLQ abd pain, intermittent- ?crampy pain, denies n/v c/d blood seen;  Last colonoscopy was 7/10 by DrJacobs= normal colon, no abn seen;  Last Gyn check was about 34yrs ago she says & due for f/u;  We discussed further eval for Abd Pain w/ CT Abd&Pelvis=> pending and attempt symptom control w/ Bentyl10mg  tid prn...    DJD> on Mobic as needed    Anxiety> she was intol to Alprazolam; offered SSRI for chemical imbalance vs Psyche referral but she declines... We reviewed prob list, meds, xrays and labs> see below for updates >>  LABS 5/14:  Chems- wnl;  CBC- wnl;  Ca125=11.9 (0-30); Sed=28... CT Abd&Pelvis 5/14> pending           Problem List:     ALLERGIC RHINITIS (ICD-477.9) - she  uses OTC meds as needed.  ASTHMA (ICD-493.90) - only using Ventolin HFA as needed (she has NEB w/ Albuterol as  well)... she hasn't been checking peak flows etc... she notes that she averages one inhalation daily- usually in the AM... she understands that she is not on a controller drug, but she does not wish to change to Symbicort, Advair, Pulmicort, etc (despite my recommendation to start and stay on one of these medications)...   HYPERTENSION (ICD-401.9) - prev on Norvasc & Hyzaar w/ good control of BP, she stopped on her own 4/13... ~  1/12:  BP= 140/94 on Hyzaar100/12.5 & we decided to add NORVASC 5mg /d... ~  5/12:  Pt reports that she decr the Amlodipine 5mg  to1/2 tab daily & BP checks at home have been good (110/80 here today). ~  10/12:  Pt reports that she decr both meds to 1/2tab daily due to dizziness & improved... 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 ~  7/13:  BP= 150/100 but she states all BP checks at home have been good; blames it on night shft job & trying to go to day job, remionded to elim salt & get wt down! ~  10/13:  BP= 144/92 & she is fatigued; denies CP, palpit, SOB, edema...  HYPERCHOLESTEROLEMIA, BORDERLINE (ICD-272.4) - on diet Rx alone... She refuses med rx for her lipids. ~  FLP 12/08 showed TChol 178, TG 35, HDL 58, LDL 113 ~  FLP 11/10 showed TChol 168, TG 45, HDL 57, LDL 102 ~  FLP 10/11 showed TChol 185, TG 49, HDL 57, LDL 118 ~  FLP 10/12 showed TChol 164, TG 30, HDL 59, LDL 99... Great, keep up the good work! ~  FLP 10/13 showed TChol 192, TG 46, HDL 52, LDL 136... She refuses meds, offered Lipid clinic.  OVERWEIGHT (ICD-278.02) - we discussed  diet + exercise program... ~  weight 1/07 = 181# ~  weight 6/09 = 206# ~  weight 11/10 = 213# ~  weight 10/11 = 213# ~  Weight 5/12 = 219#... Despite numerous diet attempts & fads. ~  Weight 10/12 = 212# ~  Weight 4/13 = 216#... We reviewed low carb, low fat diet. ~  Weight 7/13 = 219# ~  Weight 10/13 = 225# ~  Weight 5/14 = 222#  THYROID >> She presented 10/13 c/o being fatigued, cold, etc & she read on the internet that this could be hypothyroidism; I explained to her that her thyroid function as been normal (normal TSH) & this rules out hypothy, but she rejects this, says she read on the internet from "DrWiggy" that she needs a ReverseT3 checked but I declined to order this test;  She wants to have an Endocrine consult regarding her thyroid & we will refer per her request... ~  Labs 10/13 showed TSH= 2.26; Thyroid exam is neg- no goiter, no nodules; she started taking Iodine on her own & I advised her to stop... ~  1-2/14:  She had Endocrine eval by DrKerr> notes reviewed- no evid for any endocrinopathy (thyroid, adrenal, pituitary); she is c/o fatigue, depressed mood, difficulty concentrating, impaired memory, etc; also noted snoring 7 wanted her to get a sleep study & consider antidepressant meds vs Psyche referral...  GI >>  Routine colonoscopy 7/10 by DrJacobs done for rectal bleeding; Colon was normal/ negative/ no abnormalities seen & repeat rec in 28yrs...  DEGENERATIVE ARTHRITIS>  She has DJD/ OA & uses OTC anti-inflamm meds; prev tried Mobic 7.5mg  Prn...  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 ~  5/14:  We reviewed DrKerr's assessment & offered meds for depression but she declines, offered Psyche referral for "chemical imbalance" but she declines...  GENERAL HEALTH:  she sees DrCousins for GYN & she is due PAP, Mammograms, etc... had colonoscopy 7/10 by DrJacobs= neg, no polyps  etc... f/u plannned 36yrs.   Past Surgical History  Procedure Laterality Date  . Cesarean section      Outpatient Encounter Prescriptions as of 07/05/2012  Medication Sig Dispense Refill  . albuterol (PROVENTIL) (2.5 MG/3ML) 0.083% nebulizer solution Take 3 mLs (2.5 mg total) by nebulization 3 (three) times daily as needed.  75 mL  6  . albuterol (VENTOLIN HFA) 108 (90 BASE) MCG/ACT inhaler Inhale 2 puffs into the lungs every 4 (four) hours as needed.  3 Inhaler  3  . CALCIUM PO Take 750 mg by mouth daily.      . Cholecalciferol (VITAMIN D) 1000 UNITS capsule Take 1,000 Units by mouth daily.      . Multiple Vitamins-Minerals (COMPLETE WOMENS PO) Take 1 capsule by mouth 2 (two) times daily.        Marland Kitchen dicyclomine (BENTYL) 10 MG capsule Take 1 capsule (10 mg total) by mouth 4 (four) times daily -  before meals and at bedtime.  50 capsule  6  . [DISCONTINUED] cyclobenzaprine (FLEXERIL) 5 MG tablet 1 tablet at bedtime for neck pain  30 tablet  0  . [DISCONTINUED] naproxen (NAPROSYN) 500 MG tablet Take 1 tablet (500 mg total) by mouth 2 (two) times daily with a meal.  30 tablet  0  . [DISCONTINUED] NON FORMULARY Tri- iodine 1 daily--otc medication       No facility-administered encounter medications on file as of 07/05/2012.    Allergies  Allergen Reactions  . Eggs Or Egg-Derived Products     Shortness of breath, wheezing    Current Medications, Allergies, Past Medical History, Past Surgical History, Family History, and Social History were reviewed in [employer] record.    Review of Systems      See HPI - all other systems neg except as noted...  The patient denies anorexia, fever, weight loss, weight gain, vision loss, decreased hearing, hoarseness, chest pain, syncope, dyspnea on exertion, peripheral edema, prolonged cough, headaches, hemoptysis, abdominal pain, melena, hematochezia, severe indigestion/heartburn, hematuria, incontinence, muscle weakness, suspicious  skin lesions, transient blindness, difficulty walking, depression, unusual weight change, abnormal bleeding, enlarged lymph nodes, and angioedema.   Objective:   Physical Exam     WD, WN, 56 y/o BF in NAD... GENERAL:  Alert & oriented; pleasant & cooperative... HEENT:  [place]/AT, EOM-wnl, PERRLA, EACs-clear, TMs-wnl, NOSE-clear, THROAT-clear & wnl. NECK:  Supple w/ full ROM; no JVD; normal carotid impulses w/o bruits; no thyromegaly or nodules palpated; no lymphadenopathy. CHEST:  Clear to P & A; without wheezes or rales; fe scat rhonchi heard... HEART:  Regular Rhythm; without murmurs/ rubs/ or gallops detected... ABDOMEN:  Soft & nontender; normal bowel sounds; no organomegaly or masses palpated... EXT: without deformities or arthritic changes; no varicose veins/ +venous insuffic/ no edema. NEURO:  CN's intact; motor testing normal; sensory testing normal;  gait normal & balance OK. DERM:  No lesions noted; no rash etc...  RADIOLOGY DATA:  Reviewed in the EPIC EMR & discussed w/ the patient...  LABORATORY DATA:  Reviewed in the EPIC EMR & discussed w/ the patient...   Assessment & Plan:    ASTHMA>  She is clear today, control seems reasonable, using prn inhaler vs Neb...  HBP>  She clearly does not want meds! Stopped both BP meds on her own when she went to 3rd shift job; we reviewed lifestyle mod, no salt, diet efforts & encouraged wt reduction; she will monitor BP & keep a log for [name] to review... Advised that she MUST lose wt & elim salt to get her wt down...  CHOL>  on diet alone w/ FLP not at goal;  Needs better diet & get wt down!!!  OVERWEIGHT>  Wt reduction is key to her BP/ DM/ etc...  GI- HxGERD, LLQ pain>  She is currently denying GERD, reflux symptoms, etc;  C/o new LLQ pain 7 wants CT scan=> pending; try Bentyl in the interim...  DJD>  She uses OTC analgesics as needed; offered Ortho consult when ready...  ANXIETY>  She does not want med Rx or Psyche  referral...   Patient's Medications  New Prescriptions   DICYCLOMINE (BENTYL) 10 MG CAPSULE    Take 1 capsule (10 mg total) by mouth 4 (four) times daily -  before meals and at bedtime.  Previous Medications   ALBUTEROL (PROVENTIL) (2.5 MG/3ML) 0.083% NEBULIZER SOLUTION    Take 3 mLs (2.5 mg total) by nebulization 3 (three) times daily as needed.   ALBUTEROL (VENTOLIN HFA) 108 (90 BASE) MCG/ACT INHALER    Inhale 2 puffs into the lungs every 4 (four) hours as needed.   CALCIUM PO    Take 750 mg by mouth daily.   CHOLECALCIFEROL (VITAMIN D) 1000 UNITS CAPSULE    Take 1,000 Units by mouth daily.   MULTIPLE VITAMINS-MINERALS (COMPLETE WOMENS PO)    Take 1 capsule by mouth 2 (two) times daily.    Modified Medications   No medications on file  Discontinued Medications   CYCLOBENZAPRINE (FLEXERIL) 5 MG TABLET    1 tablet at bedtime for neck pain   NAPROXEN (NAPROSYN) 500 MG TABLET    Take 1 tablet (500 mg total) by mouth 2 (two) times daily with a meal.   NON FORMULARY    Tri- iodine 1 daily--otc medication

## 2012-07-09 ENCOUNTER — Ambulatory Visit (INDEPENDENT_AMBULATORY_CARE_PROVIDER_SITE_OTHER)
Admission: RE | Admit: 2012-07-09 | Discharge: 2012-07-09 | Disposition: A | Discharge status: Home / Self Care | Payer: 59 | Source: Ambulatory Visit | Attending: Pulmonary Disease | Admitting: Pulmonary Disease

## 2012-07-09 DIAGNOSIS — R1032 Left lower quadrant pain: Secondary | ICD-10-CM

## 2012-07-09 MED ORDER — IOHEXOL 300 MG/ML  SOLN
100.0000 mL | Freq: Once | INTRAMUSCULAR | Status: AC | PRN
  Administered 2012-07-09: 100 mL via INTRAVENOUS

## 2012-07-30 ENCOUNTER — Ambulatory Visit (HOSPITAL_BASED_OUTPATIENT_CLINIC_OR_DEPARTMENT_OTHER): Payer: 59 | Attending: Pulmonary Disease | Admitting: Radiology

## 2012-07-30 VITALS — Ht 65.0 in | Wt 221.0 lb

## 2012-07-30 DIAGNOSIS — G4733 Obstructive sleep apnea (adult) (pediatric): Secondary | ICD-10-CM | POA: Insufficient documentation

## 2012-08-07 LAB — HM PAP SMEAR

## 2012-08-08 DIAGNOSIS — G4733 Obstructive sleep apnea (adult) (pediatric): Secondary | ICD-10-CM

## 2012-08-08 NOTE — Procedures (Signed)
Brenda Russo, SIPE NO.:  [id number]  MEDICAL RECORD NO.:  [id number]          PATIENT TYPE:  OUT  LOCATION:  SLEEP CENTER                 FACILITY[facility]  [facility]  PHYSICIAN:  Barbaraann Share, MD,FCCPDATE OF BIRTH:  05-03-56  DATE OF STUDY:  07/30/2012                           NOCTURNAL POLYSOMNOGRAM  REFERRING PHYSICIAN:  Lonzo Cloud. Kriste Basque, MD  INDICATION FOR STUDY:  Hypersomnia with sleep apnea.  EPWORTH SLEEPINESS SCORE:  16.  MEDICATIONS:  SLEEP ARCHITECTURE:  The patient had a total sleep time of 333 minutes with very little slow-wave sleep and only 80 minutes of REM.  Sleep onset latency was normal at 8 minutes and REM onset was prolonged at 160 minutes.  Sleep efficiency was mildly reduced at 89%.  RESPIRATORY DATA:  The patient was found to have 2 apneas and 45 obstructive hypopneas, giving her an apnea-hypopnea index of 9 events per hour.  The events occurred almost exclusively during supine REM. There was moderate snoring noted throughout.  OXYGEN DATA:  There was O2 desaturation as low as 84% transiently with the patient's obstructive events.  CARDIAC DATA:  Occasional PVC noted.  MOVEMENT-PARASOMNIA:  The patient had no significant leg jerks or other abnormal behaviors noted.  IMPRESSIONS-RECOMMENDATIONS: 1. Mild obstructive sleep apnea/hypopnea syndrome, with an AHI of 9     events per hour and oxygen desaturation as low as 84%.  The events     occurred almost exclusively during REM, and therefore may affect     the patient's quality of life to a greater degree than typical mild     obstructive sleep apnea.  Treatment for this degree of sleep apnea     can include a trial of weight loss alone, upper airway surgery,     dental appliance, and also CPAP.     Clinical correlation is suggested 2. Occasional PVC noted, but no clinically significant arrhythmias     were seen.     Barbaraann Share, MD,FCCP Diplomate, American Board of  Sleep Medicine    [facility]/MEDQ  D:  08/08/2012 08:52:30  T:  08/08/2012 23:44:01  Job:  [id number]

## 2012-09-25 ENCOUNTER — Ambulatory Visit (INDEPENDENT_AMBULATORY_CARE_PROVIDER_SITE_OTHER): Payer: 59 | Admitting: Pulmonary Disease

## 2012-09-25 ENCOUNTER — Encounter: Payer: Self-pay | Admitting: Pulmonary Disease

## 2012-09-25 VITALS — BP 160/100 | HR 70 | Temp 97.3°F | Ht 65.0 in | Wt 221.0 lb

## 2012-09-25 DIAGNOSIS — G4733 Obstructive sleep apnea (adult) (pediatric): Secondary | ICD-10-CM

## 2012-09-25 NOTE — Assessment & Plan Note (Signed)
The patient has been diagnosed with mild obstructive sleep apnea, but is having significant symptoms at night and during the day.  A long discussion with her about sleep apnea, including its impact to her quality of life and cardiovascular health.  At this point, I have outlined various treatment options, including a trial of weight loss alone, upper airway surgery, dental appliance, and also CPAP.  Patient is interested in treating her sleep apnea aggressively while she is working on weight loss because of her ongoing symptoms.  I've asked her to consider a dental appliance or CPAP, and she would like some time to think about her options.

## 2012-09-25 NOTE — Progress Notes (Signed)
Subjective:    Patient ID: Brenda Russo, female    DOB: 1956-01-21, 57 y.o.   MRN: [id number]  HPI The patient is a 57 year old female who I've been asked to see for management of obstructive sleep apnea.  She has had a recent sleep study where she was found to have an AHI of 9 events per hour, and these occurred primarily during REM.  The patient states she has been told she has loud snoring, and her husband is also heard an abnormal breathing pattern during sleep.  She also has had choking arousals while dozing in a chair.  She has not rested in the mornings upon arising, and always feels tired during the day.  She notes definite sleep pressure with periods of inactivity, and her score is 18.  She also falls asleep watching television or movies in the evening.  She has only occasional sleepiness driving longer distances.  Of note, her weight is up about 10-15 pounds over the last 2 years.   Sleep Questionnaire What time do you typically go to bed?( Between what hours) 1130p-12a 1130p-12a at 1120 on 09/25/12 by Maisie Fus, CMA How long does it take you to fall asleep? 7-45mins 7-37mins at 1120 on 09/25/12 by Maisie Fus, CMA How many times during the night do you wake up? 2 2 at 1120 on 09/25/12 by Maisie Fus, CMA What time do you get out of bed to start your day? 0630 0630 at 1120 on 09/25/12 by Maisie Fus, CMA Do you drive or operate heavy machinery in your occupation? No No at 1120 on 09/25/12 by Maisie Fus, CMA How much has your weight changed (up or down) over the past two years? (In pounds) 15 lb (6.804 kg)15 lb (6.804 kg) increased at 1120 on 09/25/12 by Maisie Fus, CMA Have you ever had a sleep study before? No No at 1120 on 09/25/12 by Maisie Fus, CMA Do you currently use CPAP? No No at 1120 on 09/25/12 by Maisie Fus, CMA Do you wear oxygen at any time? No No at 1120 on 09/25/12 by Maisie Fus, CMA   Review of Systems   Constitutional: Positive for unexpected weight change. Negative for fever.  HENT: Negative for ear pain, nosebleeds, congestion, sore throat, rhinorrhea, sneezing, trouble swallowing, dental problem, postnasal drip and sinus pressure.   Eyes: Negative for redness and itching.  Respiratory: Positive for shortness of breath. Negative for cough, chest tightness and wheezing.   Cardiovascular: Negative for palpitations and leg swelling.  Gastrointestinal: Negative for nausea and vomiting.  Genitourinary: Negative for dysuria.  Musculoskeletal: Negative for joint swelling.  Skin: Negative for rash.  Neurological: Negative for headaches.  Hematological: Does not bruise/bleed easily.  Psychiatric/Behavioral: Negative for dysphoric mood. The patient is not nervous/anxious.        Objective:   Physical Exam Constitutional:  Overweight female, no acute distress  HENT:  Nares patent without discharge, but large and swollen turbinates with narrowing  Oropharynx without exudate, palate and uvula are normal  Eyes:  Perrla, eomi, no scleral icterus  Neck:  No JVD, no TMG  Cardiovascular:  Normal rate, regular rhythm, no rubs or gallops.  No murmurs        Intact distal pulses  Pulmonary :  Normal breath sounds, no stridor or respiratory distress   No rales, rhonchi, or wheezing  Abdominal:  Soft, nondistended, bowel sounds present.  No tenderness noted.   Musculoskeletal:  No lower  extremity edema noted.  Lymph Nodes:  No cervical lymphadenopathy noted  Skin:  No cyanosis noted  Neurologic:  Alert, appropriate, moves all 4 extremities without obvious deficit.         Assessment & Plan:

## 2012-09-25 NOTE — Patient Instructions (Addendum)
Decide about a dental appliance or cpap while working on weight loss.  Let me know once you decide.  Work on [employer] loss Will arrange followup once you have started treatment.

## 2012-11-13 ENCOUNTER — Ambulatory Visit (INDEPENDENT_AMBULATORY_CARE_PROVIDER_SITE_OTHER): Payer: 59 | Admitting: Pulmonary Disease

## 2012-11-13 ENCOUNTER — Encounter: Payer: Self-pay | Admitting: Pulmonary Disease

## 2012-11-13 ENCOUNTER — Other Ambulatory Visit (INDEPENDENT_AMBULATORY_CARE_PROVIDER_SITE_OTHER): Payer: 59

## 2012-11-13 VITALS — BP 132/90 | HR 60 | Temp 98.1°F | Ht 65.0 in | Wt 216.4 lb

## 2012-11-13 DIAGNOSIS — F411 Generalized anxiety disorder: Secondary | ICD-10-CM

## 2012-11-13 DIAGNOSIS — E663 Overweight: Secondary | ICD-10-CM

## 2012-11-13 DIAGNOSIS — E785 Hyperlipidemia, unspecified: Secondary | ICD-10-CM

## 2012-11-13 DIAGNOSIS — J45909 Unspecified asthma, uncomplicated: Secondary | ICD-10-CM

## 2012-11-13 DIAGNOSIS — I10 Essential (primary) hypertension: Secondary | ICD-10-CM

## 2012-11-13 DIAGNOSIS — J309 Allergic rhinitis, unspecified: Secondary | ICD-10-CM

## 2012-11-13 DIAGNOSIS — M1990 Unspecified osteoarthritis, unspecified site: Secondary | ICD-10-CM

## 2012-11-13 DIAGNOSIS — G4733 Obstructive sleep apnea (adult) (pediatric): Secondary | ICD-10-CM

## 2012-11-13 LAB — CBC WITH DIFFERENTIAL/PLATELET
Basophils Absolute: 0 10*3/uL (ref 0.0–0.1)
Eosinophils Relative: 2.6 % (ref 0.0–5.0)
HCT: 42.7 % (ref 36.0–46.0)
Hemoglobin: 14.3 g/dL (ref 12.0–15.0)
Lymphocytes Relative: 29.8 % (ref 12.0–46.0)
Lymphs Abs: 1.9 10*3/uL (ref 0.7–4.0)
Monocytes Relative: 6.2 % (ref 3.0–12.0)
Platelets: 268 10*3/uL (ref 150.0–400.0)
RDW: 14.5 % (ref 11.5–14.6)
WBC: 6.5 10*3/uL (ref 4.5–10.5)

## 2012-11-13 LAB — BASIC METABOLIC PANEL
BUN: 9 mg/dL (ref 6–23)
CO2: 28 mEq/L (ref 19–32)
Calcium: 8.8 mg/dL (ref 8.4–10.5)
GFR: 102.81 mL/min (ref >60.00)
Glucose, Bld: 97 mg/dL (ref 70–99)

## 2012-11-13 LAB — HEPATIC FUNCTION PANEL
AST: 22 U/L (ref 0–37)
Albumin: 3.6 g/dL (ref 3.5–5.2)
Total Protein: 7.3 g/dL (ref 6.0–8.3)

## 2012-11-13 LAB — LIPID PANEL
Cholesterol: 166 mg/dL (ref 0–200)
HDL: 56.2 mg/dL (ref >39.00)
VLDL: 7.4 mg/dL (ref 0.0–40.0)

## 2012-11-13 LAB — TSH: TSH: 1.23 u[IU]/mL (ref 0.35–5.50)

## 2012-11-13 MED ORDER — ALBUTEROL SULFATE (2.5 MG/3ML) 0.083% IN NEBU: 2.5000 mg | INHALATION_SOLUTION | Freq: Three times a day (TID) | RESPIRATORY_TRACT | Status: DC | PRN

## 2012-11-13 MED ORDER — ALBUTEROL SULFATE HFA 108 (90 BASE) MCG/ACT IN AERS: 2.0000 | INHALATION_SPRAY | RESPIRATORY_TRACT | Status: DC | PRN

## 2012-11-13 NOTE — Patient Instructions (Addendum)
Today we updated your med list in our EPIC system...    Continue your current medications the same...    We refilled your meds today...  We helped you complete the Flu shot exemptiion form from [facility]...  Today we did your follow up FASTING blood work...    We will contact you w/ the results when available...   Let's get on track w/ our diet & exercise program...  Call for any questions...  Let's plan a follow up visit in 27mo, sooner if needed for problems.Marland KitchenMarland Kitchen

## 2012-11-13 NOTE — Progress Notes (Signed)
Subjective:    Patient ID: Brenda Russo, female    DOB: [birth year], 56 y.o.   MRN: [id number]  HPI 57 y/o BF here for a follow up visit...  he has multiple medical problems as noted below...    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  She requests Cortisol level to be checked today as suggested by a nurse friend (BMet & Cortisol were normal)... See prob list below>>  LABS 4/13:  BMet- wnl;  Cortisol level (done at pt's request)- normal...  ~  August 30, 2011:  64mo ROV & she indicates that she's been doing well & that her home BP checks have been good (although she didn't bring her log of BPs w/ her today); BP here today is elev at 150/100 confirmed by my recheck & she remains off meds by her preference/ request; she wonders if it's related to the night shift work & he bodies diurnal variation; we discussed no salt, wt reduction, and change back to day shift job;  she is c/o some right knee pain x 39mo w/ decr ROM &ing OTC analgesics as needed...     We reviewed prob list, meds, xrays and labs> see below for updates>>  ~  November 30, 2011:  64mo ROV & Brenda Russo is c/o fatigue, tired all the time, etc (she works 7P-7A at [employer]) and did some Copy & she tells me that "Brenda Russo" says its hypothyroidism & she needs a ReverseT3 done, plus she has started taking Iodine pills; I told her that ALL of her prev Thyroid blood tests have been normal & she is not hypothyroid & she should stop the Iodine &  doesn't need a ReverseT3- see requests Endocrine consult & we will refer per her request...    AR/ Asthma> on VENTOLIN HFA prn (& refuses Proair- "it doesn't work for me"); no recent asthma attacks or exac...    HBP> prev on Amlodipine & Hyzaar; BP= 144/92 and prev dizzy resolved; she denies HAs, CP, palpit, ch in SOB, edema...    Chol> on diet alone; FLP showed TChol 192, TG 46, HDL 52, LDL 131; she refuses med Rx again & we discussed a low chol/ low fat diet...    Overweight> wt is up 7# to 225# - "I watch what I eat" and we discussed incr exercise...    DJD> on Mobic as needed    Anxiety> she is coping well & continues to decline meds... We reviewed prob list, meds, xrays and labs> see below for updates >> she refuses Flu vaccine, requests refill of all meds today...  LABS 10/13:  FLP- not at goal on diet w/ LDL=131;  Chems- wnl;  CBC- wnl;  TSH=2.26;  VitD= 29...  ~  Jul 05, 2012:  17mo ROV & Brenda Russo tells me she went to [facility] 4/14 w/ right sided neck pain & facial  pressure (?occured after coughing she says); Note reviewed- exam was neg; no XRays done; she was told she was anemic (Hg=12.0 MCV=86) but Iron was ok (Fe=88 29%sat & Ferritin=80)... Today she says "so-so" but the right sided neck/ facial symptoms have resolved and she is now c/o LLQ abd pain, intermittent- ?crampy pain, denies n/v c/d blood seen;  Last colonoscopy was 7/10 by Brenda Russo= normal colon, no abn seen;  Last Gyn check was about 39yrs ago she says & due for f/u;  We discussed further eval for Abd Pain w/ CT Abd&Pelvis...    R/O OSA> she notes snoring, tired all the time, is in need of a Sleep Study & we will sched this => pending    AR/ Asthma> on VENTOLIN HFA prn (& refuses Proair- "it doesn't work for me"); no recent asthma attacks or exac...    HBP> prev on Amlodipine & Hyzaar; BP= 150/100 today but woks 3rd shift, home BP checks all wnl & ave 120s/70s she says; she denies HAs, CP, palpit, ch in SOB, edema...    Chol> on diet  alone; FLP 10/13 showed TChol 192, TG 46, HDL 52, LDL 131; she refuses med Rx again & we discussed a low chol/ low fat diet...    Thyroid/ Endocrine> she is s/p thorough endocrine eval by Brenda Russo 2/14- everything was normal, no endocrinopathy found; he rec sleep study & Psyche referral...    Overweight> wt is down 2# to 222# - "I watch what I eat" and we discussed incr exercise...    GI- Abd pain 5/14> SEE ABOVE- c/o LLQ abd pain, intermittent- ?crampy pain, denies n/v c/d blood seen;  Last colonoscopy was 7/10 by Brenda Russo= normal colon, no abn seen;  Last Gyn check was about 34yrs ago she says & due for f/u;  We discussed further eval for Abd Pain w/ CT Abd&Pelvis=> pending and attempt symptom control w/ Bentyl10mg  tid prn...    DJD> on Mobic as needed    Anxiety> she was intol to Alprazolam; offered SSRI for chemical imbalance vs Psyche referral but she declines... We reviewed prob list, meds, xrays and labs> see below for updates >>   LABS 5/14:  Chems- wnl;  CBC- wnl;  Ca125=11.9 (0-30); Sed=28...  CT Abd&Pelvis 6/14> tiny HH, sm fat containing inguinal hernias bilat, no adenopathy, no acute findings- int organs all ok...  Sleep Study 6/14 showed AHI=9, mod snoring, transient hypoxemia to 84%, occas PVCs, otherw neg...  ~  November 13, 2012:  78mo ROV & Brenda Russo had her sleep study & consult from Brenda Russo- she is considering oral appliance vs CPAP trial while she tries to lose weight...     Her breathing is doing good she says; no resp exac, she has DOE w/ exercise- stable & asked to incr exercise program...    BP is fair on diet alone & measures 132/90 today; weight= 216# which is down 5# from prev; reminded to elim sodium...     Her prev GI symptoms resolved w/ Bentyl Rx & have not recurred, BMs are wnl she says...    She tells me that she saw Brenda Russo for Gyn & had a neg gyn eval & check up 6/14...    Vit D level is still sl low today at 29 despite 1000u OTC VitD supplement & she is asked to double  it... We reviewed prob list, meds, xrays and labs> see below for updates >> she requested [facility] form completed to exempt from Flu shot claiming an allergy to  Eggs & egg protein (severe SOB & wheezing).  LABS 10/14:  FLP- at goals on diet alone;  Chems- wnl;  CBC- wnl;  TSH=1.23;  VitD=29...          Problem List:     ALLERGIC RHINITIS (ICD-477.9) - she uses OTC meds as needed.  MILD OSA >>  ~  Sleep Study 6/14 showed AHI=9, mod snoring, transient hypoxemia to 84%, occas PVCs, otherw neg... ~  Sleep consult Brenda Russo 9/14> awakes tired, not rested, & ESS=18; the events were noted to occur mostly during REM; Rec to work on wt reduction & consider dental appliance vs CPAP...   ASTHMA (ICD-493.90) - only using Ventolin HFA as needed (she has NEB w/ Albuterol as well)... she hasn't been checking peak flows etc... she notes that she averages one inhalation daily- usually in the AM... she understands that she is not on a controller drug, but she does not wish to change to Symbicort, Advair, Pulmicort, etc (despite my recommendation to start and stay on one of these medications)...  ~  CXR 10/12 showed norm heart size, clear lungs x mild bilat apical pleural scarring, NAD...   HYPERTENSION (ICD-401.9) - prev on Norvasc & Hyzaar w/ good control of BP, she stopped on her own 4/13... ~  1/12:  BP= 140/94 on Hyzaar100/12.5 & we decided to add NORVASC 5mg /d... ~  5/12:  Pt reports that she decr the Amlodipine 5mg  to1/2 tab daily & BP checks at home have been good (110/80 here today). ~  EKG 6/12 showed SBrady, rate50, wnl, NAD... ~  10/12:  Pt reports that she decr both meds to 1/2tab daily due to dizziness & improved... 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 ~  7/13:  BP= 150/100 but she states all BP checks at home have been good; blames it on night shft job & trying to go to day job, remionded to elim salt & get wt down! ~  10/13:  BP= 144/92 & she is fatigued; denies CP, palpit, SOB, edema... ~  10/14: BP is fair on diet alone & measures 132/90 today; weight= 216# which is down 5# from prev; reminded to elim sodium...  HYPERCHOLESTEROLEMIA, BORDERLINE (ICD-272.4) - on diet Rx alone... She refuses med rx for her lipids. ~  FLP 12/08 showed TChol 178, TG 35, HDL 58, LDL 113 ~  FLP 11/10 showed TChol 168, TG 45, HDL 57, LDL 102 ~  FLP 10/11 showed TChol 185, TG 49, HDL 57, LDL 118 ~  FLP 10/12 showed TChol 164, TG 30, HDL 59, LDL 99... Great, keep up the good work! ~  FLP 10/13 showed TChol 192, TG 46, HDL 52, LDL 136... She refuses meds, offered Lipid clinic. ~  FLP 10/14 on diet alone showed TChol 166, TG 37, HDL 56, LDL 102  OVERWEIGHT (ICD-278.02) - we discussed diet + exercise program... ~  weight 1/07 = 181# ~  weight 6/09 = 206# ~  weight 11/10 = 213# ~  weight 10/11 = 213# ~  Weight 5/12 = 219#... Despite numerous diet attempts & fads. ~  Weight 10/12 = 212# ~  Weight 4/13 = 216#... We reviewed low carb, low fat diet. ~  Weight 7/13 = 219# ~  Weight 10/13 = 225# ~  Weight 5/14 = 222# ~  Weight 10/14 = 216#  THYROID >> She presented 10/13 c/o being fatigued, cold, etc & she read on the internet that this could be hypothyroidism; I explained to her that her thyroid function as been normal (normal TSH) & this rules out hypothy, but she rejects this, says she read on the internet from "Brenda Russo" that she needs a ReverseT3 checked but I declined to order this test;  She wants to have an Endocrine consult regarding her thyroid & we will refer per her request... ~  Labs 10/13 showed TSH= 2.26; Thyroid exam is neg- no goiter, no nodules; she started taking Iodine on her own & I  advised her to stop... ~  1-2/14:  She had Endocrine eval by Brenda Russo> notes reviewed- no evid for any endocrinopathy (thyroid, adrenal, pituitary); she is c/o fatigue, depressed mood, difficulty concentrating, impaired memory, etc; also noted snoring & wanted her to get a sleep study & consider antidepressant meds vs Psyche referral... (Sleep study w/ AHI=9 & she is considering Rx w/ oral appliance vs CPAP while trying to lose the weight). ~  Labs 10/14 showed TSH= 1.23  GI >> Routine colonoscopy 7/10 by Brenda Russo done for rectal bleeding; Colon was normal/ negative/ no abnormalities seen & repeat rec in 11yrs...  DEGENERATIVE ARTHRITIS>  She has DJD/ OA & uses OTC anti-inflamm meds; prev tried Mobic 7.5mg  Prn... ~  Labs 10/14 showed VitD level = 29 and she is encouraged to incr the VitD supplement to 2000u daily.  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 ~  5/14:  We reviewed Brenda Russo's assessment & offered meds for depression but she declines, offered Psyche referral for "chemical imbalance" but she declines...  GENERAL HEALTH:  she sees Brenda Russo for GYN & she is due PAP, Mammograms, etc... had colonoscopy 7/10 by Brenda Russo= neg, no polyps etc... f/u plannned 88yrs.   Past Surgical History  Procedure Laterality Date  . Cesarean section    . Tubal ligation      Outpatient Encounter Prescriptions as of 11/13/2012  Medication Sig Dispense Refill  . albuterol (PROVENTIL) (2.5 MG/3ML) 0.083% nebulizer solution Take 3 mLs (2.5 mg total) by nebulization 3 (three) times daily as needed.  75 mL  6  . albuterol (VENTOLIN HFA) 108 (90 BASE) MCG/ACT inhaler Inhale 2 puffs into the lungs every 4 (four) hours as needed.  3 Inhaler  3  . CALCIUM PO Take 750 mg by mouth daily.      . Cholecalciferol (VITAMIN D) 1000 UNITS capsule Take 1,000 Units by mouth daily.      Marland Kitchen dicyclomine (BENTYL) 10 MG capsule Take  1 capsule (10 mg total) by mouth 4 (four) times daily -  before meals and at bedtime.  50 capsule  6  . Multiple Vitamins-Minerals (COMPLETE WOMENS PO) Take 1 capsule by mouth 2 (two) times daily.         No facility-administered encounter medications on file as of 11/13/2012.    Allergies  Allergen Reactions  . Eggs Or Egg-Derived Products     Shortness of breath, wheezing    Current Medications, Allergies, Past Medical History, Past Surgical History, Family History, and Social History were reviewed in [employer] record.    Review  of Systems      See HPI - all other systems neg except as noted...  The patient denies anorexia, fever, weight loss, weight gain, vision loss, decreased hearing, hoarseness, chest pain, syncope, dyspnea on exertion, peripheral edema, prolonged cough, headaches, hemoptysis, abdominal pain, melena, hematochezia, severe indigestion/heartburn, hematuria, incontinence, muscle weakness, suspicious skin lesions, transient blindness, difficulty walking, depression, unusual weight change, abnormal bleeding, enlarged lymph nodes, and angioedema.   Objective:   Physical Exam     WD, WN, 56 y/o BF in NAD... GENERAL:  Alert & oriented; pleasant & cooperative... HEENT:  [place]/AT, EOM-wnl, PERRLA, EACs-clear, TMs-wnl, NOSE-clear, THROAT-clear & wnl. NECK:  Supple w/ full ROM; no JVD; normal carotid impulses w/o bruits; no thyromegaly or nodules palpated; no lymphadenopathy. CHEST:  Clear to P & A; without wheezes or rales; fe scat rhonchi heard... HEART:  Regular Rhythm; without murmurs/ rubs/ or gallops detected... ABDOMEN:  Soft & nontender; normal bowel sounds; no organomegaly or masses palpated... EXT: without deformities or arthritic changes; no varicose veins/ +venous insuffic/ no edema. NEURO:  CN's intact; motor testing normal; sensory testing normal; gait normal & balance OK. DERM:  No lesions noted; no rash etc...  RADIOLOGY DATA:   Reviewed in the EPIC EMR & discussed w/ the patient...  LABORATORY DATA:  Reviewed in the EPIC EMR & discussed w/ the patient...   Assessment & Plan:    Mild OSA>  She is considering her options for oral appliance vs CPAP while she tries to lose the weight...  ASTHMA>  She is clear today, control seems reasonable, using prn inhaler vs Neb...  HBP>  She clearly does not want meds! Stopped both BP meds on her own when she went to 3rd shift job; we reviewed lifestyle mod, no salt, diet efforts & encouraged wt reduction; she will monitor BP & keep a log for [name] to review... Advised that she MUST lose wt & elim salt to keep her BP down...  CHOL>  on diet alone w/ FLP improvedl;  Needs better diet & get wt down!!!  OVERWEIGHT>  Wt reduction is key to her BP/ DM/ etc...  GI- HxGERD, LLQ pain>  She is currently denying GERD, reflux symptoms, etc;  CT Abd 6/14 was essent neg=> symptoms resolved w/ Bentyl Rx...  DJD>  She uses OTC analgesics as needed; offered Ortho consult when ready...  ANXIETY>  She does not want med Rx or Psyche referral...   Patient's Medications  New Prescriptions   No medications on file  Previous Medications   CALCIUM PO    Take 750 mg by mouth daily.   CHOLECALCIFEROL (VITAMIN D) 2000 UNITS CAPS    Take 1 capsule by mouth daily.   DICYCLOMINE (BENTYL) 10 MG CAPSULE    Take 1 capsule (10 mg total) by mouth 4 (four) times daily -  before meals and at bedtime.   MULTIPLE VITAMINS-MINERALS (COMPLETE WOMENS PO)    Take 1 capsule by mouth 2 (two) times daily.    Modified Medications   Modified Medication Previous Medication   ALBUTEROL (PROVENTIL) (2.5 MG/3ML) 0.083% NEBULIZER SOLUTION albuterol (PROVENTIL) (2.5 MG/3ML) 0.083% nebulizer solution      Take 3 mLs (2.5 mg total) by nebulization 3 (three) times daily as needed.    Take 3 mLs (2.5 mg total) by nebulization 3 (three) times daily as needed.   ALBUTEROL (VENTOLIN HFA) 108 (90 BASE) MCG/ACT INHALER albuterol  (VENTOLIN HFA) 108 (90 BASE) MCG/ACT inhaler      Inhale 2  puffs into the lungs every 4 (four) hours as needed.    Inhale 2 puffs into the lungs every 4 (four) hours as needed.  Discontinued Medications   CHOLECALCIFEROL (VITAMIN D) 1000 UNITS CAPSULE    Take 1,000 Units by mouth daily.

## 2012-11-23 ENCOUNTER — Ambulatory Visit: Payer: Self-pay | Admitting: Pulmonary Disease

## 2012-12-13 ENCOUNTER — Other Ambulatory Visit: Payer: Self-pay

## 2012-12-18 ENCOUNTER — Telehealth: Payer: Self-pay | Admitting: Pulmonary Disease

## 2012-12-18 DIAGNOSIS — J45909 Unspecified asthma, uncomplicated: Secondary | ICD-10-CM

## 2012-12-18 NOTE — Telephone Encounter (Signed)
I spoke with pt and she is needing a new neb machine. Her's is broken. She is needing an order sent. She is not sure who her DME is and I did not see one in her chart. Order sent to [facility].

## 2012-12-27 ENCOUNTER — Encounter: Payer: Self-pay | Admitting: Pulmonary Disease

## 2013-01-09 ENCOUNTER — Ambulatory Visit: Payer: Self-pay | Admitting: Pulmonary Disease

## 2013-05-14 ENCOUNTER — Ambulatory Visit: Payer: Self-pay | Admitting: Pulmonary Disease

## 2013-06-15 ENCOUNTER — Encounter: Payer: 59 | Attending: "Endocrinology | Admitting: *Deleted

## 2013-06-15 DIAGNOSIS — Z713 Dietary counseling and surveillance: Secondary | ICD-10-CM | POA: Insufficient documentation

## 2013-06-15 NOTE — Progress Notes (Signed)
Patient was seen on 06/15/2013 for the Weight Loss Class at the [facility]. The following learning objectives were met by the patient during this class:   Describe healthy choices in each food group  Describe portion size of foods  Use plate method for meal planning  Demonstrate how to read Nutrition Facts food label  Set realistic goals for weight loss, diet changes, and physical activity.   Goals:  1. Make healthy food choices in each food group.  2. Reduce portion size of foods.  3. Increase fruit and vegetable intake.  4. Use plate method for meal planning.  5. Increase physical activity.   Handouts given:  1. Weight loss tips 2. Meal plan/portion card 2. Plate method  2. Food label handout

## 2013-06-18 ENCOUNTER — Telehealth: Payer: Self-pay | Admitting: Adult Health

## 2013-06-18 NOTE — Telephone Encounter (Signed)
Spoke with pt. appt scheduled to see SN tomorrow. Nothing furthr needed

## 2013-06-18 NOTE — Telephone Encounter (Signed)
Called spoke with pt. C/o pain under chin and radiates to her ears, HA, difficulty swallowing. Reports feels like a "gland" is swollen on neck. TP has no openings and pt wants to be seen for this. She is still in process of looking for PCP. Please advise SN thanks  Allergies  Allergen Reactions  . Eggs Or Egg-Derived Products     Shortness of breath, wheezing

## 2013-06-18 NOTE — Telephone Encounter (Signed)
Ok to add pt on for 9:30  am on Wednesday morning.

## 2013-06-18 NOTE — Telephone Encounter (Signed)
Pt returning call pls call on cell @ [phone].Brenda Russo

## 2013-06-19 ENCOUNTER — Encounter: Payer: Self-pay | Admitting: Pulmonary Disease

## 2013-06-19 ENCOUNTER — Ambulatory Visit (INDEPENDENT_AMBULATORY_CARE_PROVIDER_SITE_OTHER): Payer: 59 | Admitting: Pulmonary Disease

## 2013-06-19 VITALS — BP 120/88 | HR 56 | Temp 97.8°F | Ht 65.0 in | Wt 216.0 lb

## 2013-06-19 DIAGNOSIS — E663 Overweight: Secondary | ICD-10-CM

## 2013-06-19 DIAGNOSIS — J309 Allergic rhinitis, unspecified: Secondary | ICD-10-CM

## 2013-06-19 DIAGNOSIS — J45909 Unspecified asthma, uncomplicated: Secondary | ICD-10-CM

## 2013-06-19 DIAGNOSIS — G9001 Carotid sinus syncope: Secondary | ICD-10-CM

## 2013-06-19 DIAGNOSIS — J029 Acute pharyngitis, unspecified: Secondary | ICD-10-CM

## 2013-06-19 MED ORDER — PREDNISONE (PAK) 5 MG PO TABS: ORAL_TABLET | ORAL | Status: DC

## 2013-06-19 MED ORDER — FIRST-DUKES MOUTHWASH MT SUSP: OROMUCOSAL | Status: DC

## 2013-06-19 NOTE — Patient Instructions (Signed)
Today we updated your med list in our EPIC system...    Continue your current medications the same...  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    Take the Prednisone dosepak as directed for the inflammation...    You may use the Magic Mouthwash as needed for throat symptoms (one tsp gargle & swallow up to 4 times daily)    OK to use Tylenol or aleve as needed...  Call for any questions or if the symptoms fail to respond to the above therapy.Marland KitchenMarland Kitchen

## 2013-06-19 NOTE — Progress Notes (Signed)
Subjective:    Patient ID: Brenda Russo, female    DOB: 05/31/1955, 58 y.o.   MRN: [id number]  HPI 58 y/o BF here for a follow up visit...  he has multiple medical problems as noted below...    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    AR/ Asthma> on VENTOLIN HFA prn (& refuses Proair- "it doesn't work for me"); no recent asthma attacks or exac...    HBP> prev on Amlodipine & Hyzaar; BP= 144/92 and prev dizzy resolved; she denies HAs, CP, palpit, ch in SOB, edema...    Chol> on diet alone; FLP showed TChol 192, TG 46, HDL 52, LDL 131; she refuses med Rx again & we discussed a low chol/ low fat diet...    Overweight> wt is up 7# to 225# - "I watch what I eat" and we discussed incr exercise...    DJD> on Mobic as needed    Anxiety> she is coping well & continues to decline meds... We reviewed prob list, meds, xrays and labs> see below for updates >> she refuses Flu vaccine, requests refill of all meds today...  LABS 10/13:  FLP- not at goal on diet w/ LDL=131;  Chems- wnl;  CBC- wnl;  TSH=2.26;  VitD= 29...  ~  Jul 05, 2012:  54mo ROV & [name] tells me she went to [facility] 4/14 w/ right sided neck pain & facial pressure (?occured after coughing she says); Note reviewed- exam was neg; no XRays done; she was told she was anemic (Hg=12.0 MCV=86) but Iron was ok (Fe=88 29%sat & Ferritin=80)... Today she says "so-so" but the right sided neck/ facial symptoms have resolved and she is now c/o LLQ abd pain, intermittent- ?crampy pain, denies n/v c/d blood seen;  Last  colonoscopy was 7/10 by DrJacobs= normal colon, no abn seen;  Last Gyn check was about 88yrs ago she says & due for f/u;  We discussed further eval for Abd Pain w/ CT Abd&Pelvis...    R/O OSA> she notes snoring, tired all the time, is in need of a Sleep Study & we will sched this => pending    AR/ Asthma> on VENTOLIN HFA prn (& refuses Proair- "it doesn't work for me"); no recent asthma attacks or exac...    HBP> prev on Amlodipine & Hyzaar; BP= 150/100 today but woks 3rd shift, home BP checks all wnl & ave 120s/70s she says; she denies HAs, CP, palpit, ch in SOB, edema...    Chol> on diet alone; FLP 10/13 showed TChol 192, TG 46, HDL 52, LDL 131; she refuses med Rx again & we discussed a low chol/ low fat diet...    Thyroid/ Endocrine> she is s/p thorough endocrine eval by DrKerr 2/14- everything was normal, no endocrinopathy found; he rec sleep study & Psyche referral...    Overweight> wt is down 2# to 222# - "I watch what I eat" and we discussed incr exercise...    GI- Abd pain 5/14> SEE ABOVE- c/o LLQ  abd pain, intermittent- ?crampy pain, denies n/v c/d blood seen;  Last colonoscopy was 7/10 by DrJacobs= normal colon, no abn seen;  Last Gyn check was about 62yrs ago she says & due for f/u;  We discussed further eval for Abd Pain w/ CT Abd&Pelvis=> pending and attempt symptom control w/ Bentyl10mg  tid prn...    DJD> on Mobic as needed    Anxiety> she was intol to Alprazolam; offered SSRI for chemical imbalance vs Psyche referral but she declines... We reviewed prob list, meds, xrays and labs> see below for updates >>   LABS 5/14:  Chems- wnl;  CBC- wnl;  Ca125=11.9 (0-30); Sed=28...  CT Abd&Pelvis 6/14> tiny HH, sm fat containing inguinal hernias bilat, no adenopathy, no acute findings- int organs all ok...  Sleep Study 6/14 showed AHI=9, mod snoring, transient hypoxemia to 84%, occas PVCs, otherw neg...  ~  November 13, 2012:  9mo ROV & [name] had her sleep study & consult from DrClance- she is  considering oral appliance vs CPAP trial while she tries to lose weight...     Her breathing is doing good she says; no resp exac, she has DOE w/ exercise- stable & asked to incr exercise program...    BP is fair on diet alone & measures 132/90 today; weight= 216# which is down 5# from prev; reminded to elim sodium...     Her prev GI symptoms resolved w/ Bentyl Rx & have not recurred, BMs are wnl she says...    She tells me that she saw DrCousins for Gyn & had a neg gyn eval & check up 6/14...    Vit D level is still sl low today at 29 despite 1000u OTC VitD supplement & she is asked to double it... We reviewed prob list, meds, xrays and labs> see below for updates >> she requested [facility] form completed to exempt from Flu shot claiming an allergy to Eggs & egg protein (severe SOB & wheezing).  LABS 10/14:  FLP- at goals on diet alone;  Chems- wnl;  CBC- wnl;  TSH=1.23;  VitD=29...  ~  Jun 19, 2013:  30mo ROV & add-on appt requested for "sore throat" but what she is really c/o is a pain & tenderness in the right side of her neck; she thought "swollen glands" but there is no adenopathy or sialadenitis on exam; asked to put one finger on the area of pain- she identifies the impulse on the side of her neck, ie the carotid art & exam confirms a tender right carotid sheath c/w carotidynia; ?if the SCM muscle is also sl tender but there is no torticollis etc; she notes occas radiation of the pain into right ear (which is clear), mild HA (c/w musc contraction HA), & some intermittent difficulty swallowing (sounds like cricopharyngeus dysfunction); she went to her dentist recently and "it's not my teeth"...  She notes that something similar happened last yr & she went to [facility]- ?Dx, given anti-inflamm pain med, symptoms eventually resolved; she notes that she is under stress caring for her elderly mother who had to move in w/ her...     After long discussion we decided to treat w/ rest, heat, Pred dosepak, MMW prn...  I told her we may need to consider Klonopin Bid, or additional eval from ENT &/or GI...     Her Asthma is under good control w/o exac; her only prescription meds include AlbutHFA and NEB w/ Albut0.083% for prn use...    Her weight is the same (  216# for BMI=36) and her BP= 120/88 on diet alone; we reviewed low sodium weight reducing diet...          Problem List:     ALLERGIC RHINITIS (ICD-477.9) - she uses OTC meds as needed.  MILD OSA >>  ~  Sleep Study 6/14 showed AHI=9, mod snoring, transient hypoxemia to 84%, occas PVCs, otherw neg... ~  Sleep consult DrClance 9/14> awakes tired, not rested, & ESS=18; the events were noted to occur mostly during REM; Rec to work on wt reduction & consider dental appliance vs CPAP...   ASTHMA (ICD-493.90) - only using Ventolin HFA as needed (she has NEB w/ Albuterol as well)... she hasn't been checking peak flows etc... she notes that she averages one inhalation daily- usually in the AM... she understands that she is not on a controller drug, but she does not wish to change to Symbicort, Advair, Pulmicort, etc (despite my recommendation to start and stay on one of these medications)...  ~  CXR 10/12 showed norm heart size, clear lungs x mild bilat apical pleural scarring, NAD...   HYPERTENSION (ICD-401.9) - prev on Norvasc & Hyzaar w/ good control of BP, she stopped on her own 4/13... ~  1/12:  BP= 140/94 on Hyzaar100/12.5 & we decided to add NORVASC 5mg /d... ~  5/12:  Pt reports that she decr the Amlodipine 5mg  to1/2 tab daily & BP checks at home have been good (110/80 here today). ~  EKG 6/12 showed SBrady, rate50, wnl, NAD... ~  10/12:  Pt reports that she decr both meds to 1/2tab daily due to dizziness & improved... 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 ~  7/13:  BP= 150/100 but she states all BP checks at home have been good; blames it on night shft job & trying to go to day job, remionded to elim salt & get wt down! ~  10/13:  BP= 144/92 & she is fatigued; denies CP, palpit, SOB, edema... ~  10/14: BP is fair on diet alone & measures 132/90 today; weight= 216# which is down 5# from prev; reminded to elim sodium...  HYPERCHOLESTEROLEMIA, BORDERLINE (ICD-272.4) - on diet Rx alone... She refuses med rx for her lipids. ~  FLP 12/08 showed TChol 178, TG 35, HDL 58, LDL 113 ~  FLP 11/10 showed TChol 168, TG 45, HDL 57, LDL 102 ~  FLP 10/11 showed TChol 185, TG 49, HDL 57, LDL 118 ~  FLP 10/12 showed TChol 164, TG 30, HDL 59, LDL 99... Great, keep up the good work! ~  [place] 10/13 showed TChol 192, TG 46, HDL 52, LDL 136... She refuses meds, offered Lipid clinic. ~  [place] 10/14 on diet alone showed TChol 166, TG 37, HDL 56, LDL 102  OVERWEIGHT (ICD-278.02) - we discussed diet + exercise program... ~  weight 1/07 = 181# ~  weight 6/09 = 206# ~  weight 11/10 = 213# ~  weight 10/11 = 213# ~  Weight 5/12 = 219#... Despite numerous diet attempts & fads. ~  Weight 10/12 = 212# ~  Weight 4/13 = 216#... We reviewed low carb, low fat  diet. ~  Weight 7/13 = 219# ~  Weight 10/13 = 225# ~  Weight 5/14 = 222# ~  Weight 10/14 = 216#  THYROID >> She presented 10/13 c/o being fatigued, cold, etc & she read on the internet that this could be hypothyroidism; I explained to her that her thyroid function as been normal (normal TSH) & this rules out hypothy, but she rejects this, says she read on the internet from "DrWiggy" that she needs a ReverseT3 checked but I declined to order this test;  She wants to have an Endocrine consult regarding her thyroid & we will refer per her request... ~  Labs 10/13 showed TSH= 2.26; Thyroid exam is neg- no goiter,  no nodules; she started taking Iodine on her own & I advised her to stop... ~  1-2/14:  She had Endocrine eval by DrKerr> notes reviewed- no evid for any endocrinopathy (thyroid, adrenal, pituitary); she is c/o fatigue, depressed mood, difficulty concentrating, impaired memory, etc; also noted snoring & wanted her to get a sleep study & consider antidepressant meds vs Psyche referral... (Sleep study w/ AHI=9 & she is considering Rx w/ oral appliance vs CPAP while trying to lose the weight). ~  Labs 10/14 showed TSH= 1.23  GI >> Routine colonoscopy 7/10 by DrJacobs done for rectal bleeding; Colon was normal/ negative/ no abnormalities seen & repeat rec in 68yrs...  DEGENERATIVE ARTHRITIS>  She has DJD/ OA & uses OTC anti-inflamm meds; prev tried Mobic 7.5mg  Prn... ~  Labs 10/14 showed VitD level = 29 and she is encouraged to incr the VitD supplement to 2000u daily.  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 ~  5/14:  We reviewed DrKerr's assessment & offered meds for depression but she declines, offered Psyche referral for "chemical imbalance" but she declines...  GENERAL HEALTH:  she sees DrCousins for GYN & she is due PAP, Mammograms, etc... had colonoscopy 7/10 by DrJacobs= neg, no polyps etc... f/u plannned 77yrs.   Past Surgical History  Procedure Laterality Date  . Cesarean section    . Tubal ligation      Outpatient Encounter Prescriptions as of 06/19/2013  Medication Sig  . albuterol (PROVENTIL) (2.5 MG/3ML) 0.083% nebulizer solution Take 3 mLs (2.5 mg total) by nebulization 3 (three) times daily as needed.  Marland Kitchen albuterol (VENTOLIN HFA) 108 (90 BASE) MCG/ACT inhaler Inhale 2 puffs into the lungs every 4 (four) hours as needed.  Marland Kitchen CALCIUM PO Take 750 mg by mouth daily.  . Cholecalciferol (VITAMIN D) 2000 UNITS CAPS Take 1 capsule by mouth daily.  . magnesium oxide (MAG-OX) 400 MG tablet  400-800 mg daily  . Multiple Vitamins-Minerals (COMPLETE WOMENS PO) Take 1 capsule by mouth 2 (two) times daily.    . [DISCONTINUED] dicyclomine (BENTYL) 10 MG capsule Take 1 capsule (10 mg total) by mouth 4 (four) times daily -  before meals and at bedtime.    Allergies  Allergen Reactions  . Eggs Or Egg-Derived Products     Shortness of breath, wheezing    Current Medications, Allergies, Past Medical History, Past Surgical History, Family History, and Social History were reviewed in [employer] record.    Review of Systems      See HPI - all other systems neg except as noted...  The patient denies anorexia, fever, weight loss, weight gain, vision loss, decreased hearing, hoarseness, chest pain, syncope, dyspnea on exertion,  peripheral edema, prolonged cough, headaches, hemoptysis, abdominal pain, melena, hematochezia, severe indigestion/heartburn, hematuria, incontinence, muscle weakness, suspicious skin lesions, transient blindness, difficulty walking, depression, unusual weight change, abnormal bleeding, enlarged lymph nodes, and angioedema.   Objective:   Physical Exam     WD, WN, 58 y/o BF in NAD... GENERAL:  Alert & oriented; pleasant & cooperative... HEENT:  [place]/AT, EOM-wnl, PERRLA, EACs-clear, TMs-wnl, NOSE-clear, THROAT-clear & wnl. NECK:  Supple w/ full ROM; no JVD; normal carotid impulses w/o bruits; no thyromegaly or nodules palpated; no lymphadenopathy. CHEST:  Clear to P & A; without wheezes or rales; fe scat rhonchi heard... HEART:  Regular Rhythm; without murmurs/ rubs/ or gallops detected... ABDOMEN:  Soft & nontender; normal bowel sounds; no organomegaly or masses palpated... EXT: without deformities or arthritic changes; no varicose veins/ +venous insuffic/ no edema. NEURO:  CN's intact; motor testing normal; sensory testing normal; gait normal & balance OK. DERM:  No lesions noted; no rash etc...  RADIOLOGY DATA:  Reviewed in the EPIC EMR &  discussed w/ the patient...  LABORATORY DATA:  Reviewed in the EPIC EMR & discussed w/ the patient...   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    Mild OSA>  She is considering her options for oral appliance vs CPAP while she tries to lose the weight...  ASTHMA>  She is clear today, control seems reasonable, using prn inhaler vs Neb...  HBP>  She clearly does not want meds! Stopped both BP meds on her own when she went to 3rd shift job; we reviewed lifestyle mod, no salt, diet efforts & encouraged wt reduction; she will monitor BP & keep a log for [name] to review... Advised that she MUST lose wt & elim salt to keep her BP down...  CHOL>  on diet alone w/ FLP improvedl;  Needs better diet & get wt down!!!  OVERWEIGHT>  Wt reduction is key to her BP/ DM/ etc...  GI- HxGERD, LLQ pain>  She is currently denying GERD, reflux symptoms, etc;  CT Abd 6/14 was essent neg=> symptoms resolved w/ Bentyl Rx...  DJD>  She uses OTC analgesics as needed; offered Ortho consult when ready...  ANXIETY>  She does not want med Rx or Psyche referral...   Patient's Medications  New Prescriptions   DIPHENHYD-HYDROCORT-NYSTATIN (FIRST-DUKES MOUTHWASH) SUSP    1 tsp gargle and swallow four times daily as needed   PREDNISONE (STERAPRED UNI-PAK) 5 MG TABS TABLET    Take as directed  Previous Medications   ALBUTEROL (PROVENTIL) (2.5 MG/3ML) 0.083% NEBULIZER SOLUTION    Take 3 mLs (2.5 mg total) by nebulization 3 (three) times daily as needed.   ALBUTEROL (VENTOLIN HFA) 108 (90 BASE) MCG/ACT INHALER    Inhale 2 puffs into the lungs every 4 (four) hours as needed.   CALCIUM PO    Take 750 mg by mouth daily.   CHOLECALCIFEROL (VITAMIN D) 2000 UNITS CAPS     Take 1 capsule by mouth daily.   MAGNESIUM OXIDE (MAG-OX) 400 MG TABLET    400-800 mg daily   MULTIPLE VITAMINS-MINERALS (COMPLETE WOMENS PO)    Take 1 capsule by mouth 2 (two) times daily.    Modified Medications  No medications on file  Discontinued Medications   DICYCLOMINE (BENTYL) 10 MG CAPSULE    Take 1 capsule (10 mg total) by mouth 4 (four) times daily -  before meals and at bedtime.

## 2013-07-03 ENCOUNTER — Telehealth: Payer: Self-pay | Admitting: Pulmonary Disease

## 2013-07-03 DIAGNOSIS — J029 Acute pharyngitis, unspecified: Secondary | ICD-10-CM

## 2013-07-03 NOTE — Telephone Encounter (Signed)
Per SN---   Ok to refer to ENT---Dr. Wilburn Cornelia.  thanks

## 2013-07-03 NOTE — Telephone Encounter (Signed)
Pt is aware that we will make this referral for her. Order was placed. Nothing further was needed.

## 2013-07-03 NOTE — Telephone Encounter (Signed)
Pt seen 06-19-13; Pt states she is having concerns with her throat area-sore and low grade fever as well that started Monday. She took MMW and prednisone. Wants referral to see ENT. Please advise. Thanks.

## 2013-07-26 ENCOUNTER — Telehealth: Payer: Self-pay | Admitting: Pulmonary Disease

## 2013-07-26 NOTE — Telephone Encounter (Signed)
Called spoke w/ pt. She wants help with getting new PCP. She has been leaving messages with Dr. Blain Pais office and no one returns her call. She wants to know if we can make a referral to someone else. Please advise SN thanks

## 2013-07-31 NOTE — Telephone Encounter (Signed)
Spoke with patient -- aware of recs for new PCP per SN Pt refused at this time to contact their office d/t Dr Doug Sou being outside of [place] and in the [facility] Explained to pt that [facility] is not taking new patients d/t the increased amts of new patients received in the last few weeks.  Pt would not elaborate on why she was refusing to see a physician at [facility]. States she will continue looking on her own for a new PCP and would contact our office if she decides to schedule appt with Dr Doug Sou at a later date.  Nothing further needed.

## 2013-07-31 NOTE — Telephone Encounter (Signed)
We can give her the number to primary care---Dr. Doug Sou will be seeing new pts in September.  She can call and get set up with an appt to see her.  thanks

## 2013-11-06 ENCOUNTER — Encounter: Payer: Self-pay | Admitting: Internal Medicine

## 2013-11-06 ENCOUNTER — Telehealth: Payer: Self-pay | Admitting: Internal Medicine

## 2013-11-06 ENCOUNTER — Ambulatory Visit (INDEPENDENT_AMBULATORY_CARE_PROVIDER_SITE_OTHER): Payer: 59 | Admitting: Internal Medicine

## 2013-11-06 VITALS — BP 158/98 | HR 61 | Temp 98.2°F | Ht 65.0 in | Wt 210.0 lb

## 2013-11-06 DIAGNOSIS — M545 Low back pain, unspecified: Secondary | ICD-10-CM

## 2013-11-06 DIAGNOSIS — J45909 Unspecified asthma, uncomplicated: Secondary | ICD-10-CM

## 2013-11-06 DIAGNOSIS — F411 Generalized anxiety disorder: Secondary | ICD-10-CM

## 2013-11-06 DIAGNOSIS — I10 Essential (primary) hypertension: Secondary | ICD-10-CM

## 2013-11-06 DIAGNOSIS — E663 Overweight: Secondary | ICD-10-CM

## 2013-11-06 DIAGNOSIS — E785 Hyperlipidemia, unspecified: Secondary | ICD-10-CM

## 2013-11-06 NOTE — Assessment & Plan Note (Signed)
Last FLP satisfactorily

## 2013-11-06 NOTE — Telephone Encounter (Signed)
Pt needing order for labs for CPE, lab appt made for 02/25/14 and CPE 03/04/14.

## 2013-11-06 NOTE — Progress Notes (Signed)
Subjective:    Patient ID: Brenda Russo, female    DOB: [birth year], 58 y.o.   MRN: [id number]  DOS:  11/06/2013 Type of visit - description : new pt, transferring from Dr Lenna Gilford Chart is reviewed, past history, family history and social history updated. Labs reviewed, not due to repeat labs  Today  History of high blood pressure, reports that her BP is much improved since she finished attending [employer], checks BPs regularly and they're usually 117/80. Anxiety, much improved since she finished her school a [employer] [facility], well-controlled, uses inhaler pre exercise and every morning "out of habit". Also complained of back pain for 1 month, lower back, no radiation, symptoms started after her G-son jumped on her back. Taking Tylenol as needed . Concerned about her weight, request information about weight loss  ROS Denies nausea, vomiting, diarrhea or blood in the stools No chest pain ,  difficulty breathing No depression per se No lower extremity paresthesias.  Past Medical History  Diagnosis Date  . Allergic rhinitis   . Asthma   . Hyperlipidemia     borderline  . Overweight(278.02)   . Plantar wart     history  . Anxiety   . HTN (hypertension)   . OSA (obstructive sleep apnea)     mild ,no CPAP    Past Surgical History  Procedure Laterality Date  . Cesarean section    . Tubal ligation      History   Social History  . Marital Status: Married    Spouse Name: N/A    Number of Children: 7  . Years of Education: N/A   Occupational History  . nurse secretary at [employer]  . Smoking status: Never Smoker   . Smokeless tobacco: Never Used  . Alcohol Use: No  . Drug Use: No  . Sexual Activity: Not Currently   Other Topics Concern  . Not on file   Social History Narrative   3 biological children   1 adopted child   3 step-children                 Medication List       This list is accurate as of: 11/06/13  2:09 PM.   Always use your most recent med list.               albuterol 108 (90 BASE) MCG/ACT inhaler  Commonly known as:  VENTOLIN HFA  Inhale 2 puffs into the lungs every 4 (four) hours as needed.     albuterol (2.5 MG/3ML) 0.083% nebulizer solution  Commonly known as:  PROVENTIL  Take 3 mLs (2.5 mg total) by nebulization 3 (three) times daily as needed.     CALCIUM PO  Take 750 mg by mouth daily.     COMPLETE WOMENS PO  Take 1 capsule by mouth 2 (two) times daily.     magnesium oxide 400 MG tablet  Commonly known as:  MAG-OX  400-800 mg daily     Vitamin D 2000 UNITS Caps  Take 1 capsule by mouth daily.           Objective:   Physical Exam BP 158/98  Pulse 61  Temp(Src) 98.2 F (36.8 C) (Oral)  Ht 5\' 5"  (1.651 m)  Wt 210 lb (95.255 kg)  BMI 34.95 kg/m2  SpO2 96% General -- alert, well-developed, NAD.  Neck --no thyromegaly  HEENT-- Not pale.  B Ear w/ wax  Lungs -- normal respiratory effort, no intercostal retractions, no accessory muscle use, and normal breath sounds.  Heart-- normal rate, regular rhythm, no murmur.  Back no TTP Extremities-- no pretibial edema bilaterally  Neurologic--  alert & oriented X3. Speech normal, gait appropriate for age, strength symmetric and appropriate for age.  DTRs symmetric. Psych-- Cognition and judgment appear intact. Cooperative with normal attention span and concentration. No anxious or depressed appearing.          Assessment & Plan:     Today , I spent more than  30  min with the patient: >50% of the time counseling regards Diet, exercise, will call with physical therapy. Also reviewing the chart and labs ordered by other providers

## 2013-11-06 NOTE — Telephone Encounter (Signed)
Dr. Larose Kells prefers seeing Pt for CPE before having labs ordered. She will need to get labs done on 1/26 or come back at a later date.

## 2013-11-06 NOTE — Assessment & Plan Note (Signed)
Back pain for 1 month, see history of present illness. We discussed the role of physical therapy, we will try to PT on her own, see instructions. We also discussed use of Tylenol or Motrin If not better will call for possible formal PT

## 2013-11-06 NOTE — Assessment & Plan Note (Signed)
Improved after she finish her studies @ [employer]

## 2013-11-06 NOTE — Patient Instructions (Signed)
[place] with information about home physical therapy for back pain: [email or web address].[email or web address]  Check the  blood pressure 2 or 3 times a  week be sure it is between 110/60 and 140/85. Ideal blood pressure is 120/80. If it is consistently higher or lower, let me know  aVOID  using  albuterol every morning unless you have cough or wheezing  CONSIDER using a CALORIE COUNTING program to lose weight call   [facility]NES[facility]AL   Please come back to the office in 4 months  for a physical exam. Come back fasting

## 2013-11-06 NOTE — Assessment & Plan Note (Signed)
Reports that since his stress decreased after she finished her studies at [employer]  her BP is better. BP today is a little high, states that happens when she goes to the doctor's office. Plan: Continue monitoring BPs, reassess in 4 months, see instructions

## 2013-11-06 NOTE — Assessment & Plan Note (Addendum)
Well-controlled, plans to get a flu shot at work. Recommend not to use albuterol every morning "out of habit  ", Okay to use before exercise and as needed for cough or wheezing

## 2013-11-06 NOTE — Progress Notes (Signed)
Pre visit review using our clinic review tool, if applicable. No additional management support is needed unless otherwise documented below in the visit note. 

## 2013-11-06 NOTE — Assessment & Plan Note (Addendum)
Concerned about her weight,   discuss extensively diet, recommend calorie count. See instructions

## 2014-01-08 ENCOUNTER — Other Ambulatory Visit: Payer: Self-pay | Admitting: Pulmonary Disease

## 2014-02-25 ENCOUNTER — Other Ambulatory Visit: Payer: Self-pay

## 2014-02-26 ENCOUNTER — Other Ambulatory Visit: Payer: Self-pay

## 2014-03-04 ENCOUNTER — Encounter: Payer: Self-pay | Admitting: Internal Medicine

## 2014-03-05 ENCOUNTER — Encounter: Payer: Self-pay | Admitting: Internal Medicine

## 2014-04-28 ENCOUNTER — Telehealth: Payer: Self-pay | Admitting: *Deleted

## 2014-04-28 ENCOUNTER — Encounter: Payer: Self-pay | Admitting: *Deleted

## 2014-04-28 NOTE — Telephone Encounter (Signed)
Pre-Visit Call completed with patient and chart updated.   Pre-Visit Info documented in Specialty Comments under [employer].    

## 2014-04-29 ENCOUNTER — Encounter: Payer: Self-pay | Admitting: Internal Medicine

## 2014-04-29 ENCOUNTER — Ambulatory Visit (INDEPENDENT_AMBULATORY_CARE_PROVIDER_SITE_OTHER): Payer: 59 | Admitting: Internal Medicine

## 2014-04-29 VITALS — BP 124/66 | HR 57 | Temp 97.0°F | Ht 65.0 in | Wt 210.2 lb

## 2014-04-29 DIAGNOSIS — Z0000 Encounter for general adult medical examination without abnormal findings: Secondary | ICD-10-CM

## 2014-04-29 DIAGNOSIS — R739 Hyperglycemia, unspecified: Secondary | ICD-10-CM

## 2014-04-29 MED ORDER — HYDROCORTISONE 2.5 % EX CREA: TOPICAL_CREAM | Freq: Two times a day (BID) | CUTANEOUS | Status: DC

## 2014-04-29 MED ORDER — KETOCONAZOLE 2 % EX CREA: 1.0000 "application " | TOPICAL_CREAM | Freq: Every day | CUTANEOUS | Status: DC

## 2014-04-29 NOTE — Assessment & Plan Note (Addendum)
Flu vaccine--10/08/13 at [facility] Tdap--declined pnm shot -- (h/o asthma) declined   Pap--01/07/14 with Dr. Garwin Brothers- per patient normal  MMG--12/06/12 with Elige Radon, MD at [facility]- BI-RADS CAT 1: Neg  CCS--08/19/08- normal, Dr Ardis Hughs,  follow-up in 10 years Order a bone density test  Diet-exercise discussed   Other issues On vitamin D supplements, checking levels Hypertension, on no medications, she takes all leftover medicine sometimes (losartan? Amlodipine?) Recommend not to take medications and let  me know if BP is consistently elevated Rash, neck: Eczema? Fungal? Recommend nizoral  hydrocortisone 2.5% as needed, call if persistent History of asthma, currently inactive problem

## 2014-04-29 NOTE — Patient Instructions (Signed)
Please schedule labs to be done within few days (fasting)  Use the cream as needed for the rash, call if the rash is persisting  Check the  blood pressure 2 or 3 times a  Week  Be sure your blood pressure is between 110/65 and  145/85.  if it is consistently higher or lower, let me know      Come back to the office in 1 year  for a physical exam  Please schedule an appointment at the front desk    Come back fasting     If you need more information about a healthy diet,  visit  the American Heart Association, it  is a great resource online at:  http://[email or web address]-[email or web address].[email or web address]/

## 2014-04-29 NOTE — Progress Notes (Signed)
Pre visit review using our clinic review tool, if applicable. No additional management support is needed unless otherwise documented below in the visit note. 

## 2014-04-29 NOTE — Progress Notes (Signed)
Subjective:    Patient ID: Brenda Russo, female    DOB: 20-Oct-1955, 59 y.o.   MRN: [id number]  DOS:  04/29/2014 Type of visit - description : CPX Interval history: Feeling well, no major concerns, see review of systems    Review of Systems Constitutional: No fever, chills. No unexplained wt changes. No unusual sweats HEENT: No dental problems, ear discharge, facial swelling, voice changes. No eye discharge, redness or intolerance to light Respiratory: No wheezing or difficulty breathing. No cough , mucus production Cardiovascular: No CP, leg swelling or palpitations GI: no nausea, vomiting, diarrhea or abdominal pain.  No blood in the stools. No dysphagia   Endocrine: No polyphagia, polyuria or polydipsia GU: No dysuria, gross hematuria, difficulty urinating. No urinary urgency or frequency. Musculoskeletal: No joint swellings; complained of left arm pain on and off since he had to clear ice after a snowstorm. No shoulder or neck pain per se. Skin: On and off  itchy rash at the neck, left side x few days. Allergic, immunologic: No environmental allergies or food allergies Neurological: No dizziness or syncope. No headaches. No diplopia, slurred speech, motor deficits, facial numbness Hematological: No enlarged lymph nodes, easy bruising or bleeding Psychiatry: No suicidal ideas, hallucinations, behavior problems or confusion. No unusual/severe anxiety or depression.     Past Medical History  Diagnosis Date  . Allergic rhinitis   . Asthma   . Hyperlipidemia     borderline  . Overweight(278.02)   . Plantar wart     history  . Anxiety   . HTN (hypertension)   . OSA (obstructive sleep apnea)     mild ,no CPAP    Past Surgical History  Procedure Laterality Date  . Cesarean section    . Tubal ligation      History   Social History  . Marital Status: Married    Spouse Name: N/A  . Number of Children: 7  . Years of Education: N/A   Occupational History  .  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 Smoking status: Never Smoker   . Smokeless tobacco: Never Used  . Alcohol Use: No  . Drug Use: No  . Sexual Activity: Not Currently   Other Topics Concern  . Not on file   Social History Narrative   3 biological children   1 adopted child   3 step-children   Household-- pt , husband and a daughter          Family History  Problem Relation Age of Onset  . Pancreatic cancer Brother   . Breast cancer Other     Aunt  . Heart disease Mother     pacemaker   . Asthma Father   . Hypertension Mother   . Colon cancer Neg Hx         Medication List       This list is accurate as of: 04/29/14 11:59 PM.  Always use your most recent med list.               albuterol (2.5 MG/3ML) 0.083% nebulizer solution  Commonly known as:  PROVENTIL  Take 3 mLs (2.5 mg total) by nebulization 3 (three) times daily as needed.     VENTOLIN HFA 108 (90 BASE) MCG/ACT inhaler  Generic drug:  albuterol  INHALE 2 PUFFS INTO THE LUNGS EVERY 4 HOURS AS NEEDED.     CALCIUM PO  Take 250 mg by mouth daily.  COMPLETE WOMENS PO  Take 1 capsule by mouth 2 (two) times daily.     hydrocortisone 2.5 % cream  Apply topically 2 (two) times daily.     ketoconazole 2 % cream  Commonly known as:  NIZORAL  Apply 1 application topically daily.     magnesium oxide 400 MG tablet  Commonly known as:  MAG-OX  400-800 mg daily     Vitamin D 2000 UNITS Caps  Take 1 capsule by mouth daily.           Objective:   Physical Exam BP 124/66 mmHg  Pulse 57  Temp(Src) 97 F (36.1 C) (Oral)  Ht 5\' 5"  (1.651 m)  Wt 210 lb 4 oz (95.369 kg)  BMI 34.99 kg/m2  SpO2 98% General:   Well developed, well nourished . NAD.  Neck:  Full range of motion. Supple. No  thyromegaly , normal carotid pulse HEENT:  Normocephalic . Face symmetric, atraumatic Left side of the neck, has a patchy, slightly scaly, hyperpigmented lesion Lungs:  CTA  B Normal respiratory effort, no intercostal retractions, no accessory muscle use. Heart: RRR,  no murmur.  Abdomen:  Not distended, soft, non-tender. No rebound or rigidity. No mass,organomegaly Muscle skeletal: no pretibial edema bilaterally  Skin: Exposed areas without rash. Not pale. Not jaundice Neurologic:  alert & oriented X3.  Speech normal, gait appropriate for age and unassisted Strength symmetric and appropriate for age.  Psych: Cognition and judgment appear intact.  Cooperative with normal attention span and concentration.  Behavior appropriate. No anxious or depressed appearing.       Assessment & Plan:

## 2014-05-07 ENCOUNTER — Other Ambulatory Visit: Payer: 59

## 2014-05-13 ENCOUNTER — Other Ambulatory Visit (INDEPENDENT_AMBULATORY_CARE_PROVIDER_SITE_OTHER): Payer: 59

## 2014-05-13 DIAGNOSIS — Z0000 Encounter for general adult medical examination without abnormal findings: Secondary | ICD-10-CM

## 2014-05-13 LAB — COMPREHENSIVE METABOLIC PANEL
ALK PHOS: 84 U/L (ref 39–117)
ALT: 15 U/L (ref 0–35)
AST: 18 U/L (ref 0–37)
Albumin: 3.5 g/dL (ref 3.5–5.2)
BILIRUBIN TOTAL: 0.7 mg/dL (ref 0.2–1.2)
BUN: 12 mg/dL (ref 6–23)
CO2: 28 mEq/L (ref 19–32)
Calcium: 8.7 mg/dL (ref 8.4–10.5)
Chloride: 106 mEq/L (ref 96–112)
Creatinine, Ser: 0.76 mg/dL (ref 0.40–1.20)
GFR: 100.71 mL/min (ref >60.00)
Glucose, Bld: 112 mg/dL — ABNORMAL HIGH (ref 70–99)
Potassium: 3.8 mEq/L (ref 3.5–5.1)
SODIUM: 139 meq/L (ref 135–145)
TOTAL PROTEIN: 7 g/dL (ref 6.0–8.3)

## 2014-05-13 LAB — LIPID PANEL
CHOL/HDL RATIO: 3
Cholesterol: 151 mg/dL (ref 0–200)
HDL: 50.8 mg/dL (ref >39.00)
LDL Cholesterol: 90 mg/dL (ref 0–99)
NONHDL: 100.2
Triglycerides: 49 mg/dL (ref 0.0–149.0)
VLDL: 9.8 mg/dL (ref 0.0–40.0)

## 2014-05-14 ENCOUNTER — Telehealth: Payer: Self-pay | Admitting: Internal Medicine

## 2014-05-14 NOTE — Telephone Encounter (Signed)
Caller name:Russo Brenda Relation to FA:OZHY Call back [phone] Pharmacy:  Reason for call: pt states she has missed 2 calls from the office number, would like to know if you are trying to contact her to review her labs.

## 2014-05-14 NOTE — Telephone Encounter (Signed)
Unsure who has been trying to call, I have not, also did not see note in chart.

## 2014-05-16 LAB — VITAMIN D 1,25 DIHYDROXY
VITAMIN D 1, 25 (OH) TOTAL: 49 pg/mL (ref 18–72)
Vitamin D2 1, 25 (OH)2: <8 pg/mL
Vitamin D3 1, 25 (OH)2: 49 pg/mL

## 2014-05-20 NOTE — Addendum Note (Signed)
Addended by: Peggyann Shoals on: 05/20/2014 10:30 AM   Modules accepted: Orders

## 2014-05-21 ENCOUNTER — Other Ambulatory Visit (INDEPENDENT_AMBULATORY_CARE_PROVIDER_SITE_OTHER): Payer: 59

## 2014-05-21 DIAGNOSIS — R739 Hyperglycemia, unspecified: Secondary | ICD-10-CM | POA: Diagnosis not present

## 2014-05-21 LAB — HEMOGLOBIN A1C: Hgb A1c MFr Bld: 6 % (ref 4.6–6.5)

## 2014-05-26 ENCOUNTER — Other Ambulatory Visit: Payer: Self-pay

## 2014-08-07 ENCOUNTER — Telehealth: Payer: Self-pay | Admitting: Pulmonary Disease

## 2014-08-07 ENCOUNTER — Other Ambulatory Visit: Payer: Self-pay | Admitting: Critical Care Medicine

## 2014-08-07 ENCOUNTER — Other Ambulatory Visit: Payer: Self-pay | Admitting: Pulmonary Disease

## 2014-08-07 MED ORDER — ALBUTEROL SULFATE HFA 108 (90 BASE) MCG/ACT IN AERS: INHALATION_SPRAY | RESPIRATORY_TRACT | Status: DC

## 2014-08-07 NOTE — Telephone Encounter (Signed)
Informed pt we would send in for 1 inhaler but must make an appt when pt comes back from out of town and before any further refills. Pt agreed. Nothing further needed.

## 2014-08-20 ENCOUNTER — Encounter: Payer: Self-pay | Admitting: Skilled Nursing Facility1

## 2014-08-20 ENCOUNTER — Encounter: Payer: 59 | Attending: Internal Medicine | Admitting: Skilled Nursing Facility1

## 2014-08-20 VITALS — Ht 65.0 in | Wt 217.0 lb

## 2014-08-20 DIAGNOSIS — Z713 Dietary counseling and surveillance: Secondary | ICD-10-CM | POA: Diagnosis not present

## 2014-08-20 DIAGNOSIS — E663 Overweight: Secondary | ICD-10-CM | POA: Diagnosis not present

## 2014-08-20 DIAGNOSIS — Z6836 Body mass index (BMI) 36.0-36.9, adult: Secondary | ICD-10-CM | POA: Insufficient documentation

## 2014-08-20 NOTE — Patient Instructions (Signed)
-  USP at the bottom of calcium supplement -Find your motivation -Find some kind of physical activity to do every day; walking the dog or swimming in the pool -Try to eat lunch every day -Some activities to not eat when you are not hungry: clean the kitchen from dinner, walking, or window shopping -Celebrate little victories! -Rewards: new outfit, take a trip, and a special date day -Do not weigh yourself more than once a week

## 2014-08-20 NOTE — Progress Notes (Signed)
  Medical Nutrition Therapy:  Appt start time: 1400 end time:  1500.   Assessment:  Primary concerns today: [employer] employee. Pt states she would like credit for Living well through [employer]. Pt states she wants to lose weight.  Diet hx: exercise classes, but then did not keep up with it. Pts usual wt: 210-217 pounds for about 5 years before that it was in the 160's. Pt states she eats at the kitchen table. Pt states she has a bowel movement 2-3 times a day. Pt states she works 3rd shift. Pts A1C 6.0.  Preferred Learning Style:   No preference indicated   Learning Readiness:   Ready  MEDICATIONS: See List   DIETARY INTAKE:  Usual eating pattern includes 2 meals and 3 snacks per day.  Everyday foods include none stated.  Avoided foods include dairy.    24-hr recall:  B ( AM): banana------green smoothies Snk ( AM): almonds, fruit L ( PM): none-----fast food Snk ( PM): peanut butter crackers, nabs, chips, cookies, fruit D ( PM): spagetti, kale, garlic bread, cucumbers  Snk ( PM): none Beverages: water  Usual physical activity: Attempts 10,000 steps a day, ADL's   Estimated energy needs: 1600 calories 180 g carbohydrates 120 g protein 44 g fat  Progress Towards Goal(s):  In progress.   Nutritional Diagnosis:  [place]-3.3 Overweight/obesity As related to calorically dense snacking in multiple events.  As evidenced by BMI 36.11, pt report, and 24 hour recall.    Intervention:  Nutrition counseling for weight loss. Dietitian educated the pt on a balanced/varied diet, physical activity, and implementing change.  Goals: -USP at the bottom of calcium supplement -Find your motivation -Find some kind of physical activity to do every day; walking the dog or swimming in the pool -Try to eat lunch every day -Some activities to not eat when you are not hungry: clean the kitchen from dinner, walking, or window shopping -Celebrate little victories! -Rewards: new outfit, take a  trip, and a special date day -Do not weigh yourself more than once a week  Teaching Method Utilized:  Visual Auditory  Handouts given during visit include:  Snack sheet  MyPlate  Barriers to learning/adherence to lifestyle change: adhering to change  Demonstrated degree of understanding via:  Teach Back   Monitoring/Evaluation:  Dietary intake, exercise, and body weight prn.

## 2014-09-03 ENCOUNTER — Encounter: Payer: Self-pay | Admitting: Pulmonary Disease

## 2014-09-03 ENCOUNTER — Ambulatory Visit (INDEPENDENT_AMBULATORY_CARE_PROVIDER_SITE_OTHER): Payer: 59 | Admitting: Pulmonary Disease

## 2014-09-03 VITALS — BP 160/84 | HR 65 | Temp 97.6°F | Ht 65.0 in | Wt 215.4 lb

## 2014-09-03 DIAGNOSIS — J3089 Other allergic rhinitis: Secondary | ICD-10-CM | POA: Diagnosis not present

## 2014-09-03 DIAGNOSIS — J4520 Mild intermittent asthma, uncomplicated: Secondary | ICD-10-CM | POA: Diagnosis not present

## 2014-09-03 DIAGNOSIS — E663 Overweight: Secondary | ICD-10-CM | POA: Diagnosis not present

## 2014-09-03 DIAGNOSIS — G4733 Obstructive sleep apnea (adult) (pediatric): Secondary | ICD-10-CM | POA: Diagnosis not present

## 2014-09-03 MED ORDER — ALBUTEROL SULFATE HFA 108 (90 BASE) MCG/ACT IN AERS: INHALATION_SPRAY | RESPIRATORY_TRACT | Status: DC

## 2014-09-03 MED ORDER — ALBUTEROL SULFATE (2.5 MG/3ML) 0.083% IN NEBU: 2.5000 mg | INHALATION_SOLUTION | Freq: Three times a day (TID) | RESPIRATORY_TRACT | Status: DC | PRN

## 2014-09-03 NOTE — Patient Instructions (Signed)
Today we updated your med list in our EPIC system...    Continue your current medications the same...  We refilled your meds per request...  Call for any questions or if we can be of service in any way.Marland KitchenMarland Kitchen

## 2014-09-03 NOTE — Progress Notes (Signed)
Subjective:    Patient ID: Brenda Russo, female    DOB: 03/22/1955, 59 y.o.   MRN: [id number]  HPI 59 y/o BF here for a follow up visit...  he has multiple medical problems as noted below...   ~  SEE PREV EPIC NOTES FOR OLDER DATA >>    LABS 10/13:  FLP- not at goal on diet w/ LDL=131;  Chems- wnl;  CBC- wnl;  TSH=2.26;  VitD= 29...  LABS 5/14:  Chems- wnl;  CBC- wnl;  Ca125=11.9 (0-30); Sed=28...  CT Abd&Pelvis 6/14> tiny HH, sm fat containing inguinal hernias bilat, no adenopathy, no acute findings- int organs all ok...  Sleep Study 6/14 showed AHI=9, mod snoring, transient hypoxemia to 84%, occas PVCs, otherw neg...  ~  November 13, 2012:  41mo ROV & Brenda Russo had her sleep study & consult from Brenda Russo- she is considering oral appliance vs CPAP trial while she tries to lose weight...     Her breathing is doing good she says; no resp exac, she has DOE w/ exercise- stable & asked to incr exercise program...    BP is fair on diet alone & measures 132/90 today; weight= 216# which is down 5# from prev; reminded to elim sodium...     Her prev GI symptoms resolved w/ Brenda Russo Rx & have not recurred, BMs are wnl she says...    She tells me that she saw Brenda Russo for Gyn & had a neg gyn eval & check up 6/14...    Vit D level is still sl low today at 29 despite 1000u OTC VitD supplement & she is asked to double it... We reviewed prob list, meds, xrays and labs> see below for updates >> she requested [facility] form completed to exempt from Flu shot claiming an allergy to Eggs & egg protein (severe SOB & wheezing).  LABS 10/14:  FLP- at goals on diet alone;  Chems- wnl;  CBC- wnl;  TSH=1.23;  VitD=29...  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  She notes that something similar happened last yr & she went to [facility]x, given anti-inflamm pain med, symptoms eventually resolved; she notes that she is under stress caring for her elderly mother who had to move in w/ her...     After long discussion we decided to treat w/ rest, heat, Pred dosepak, MMW prn... I told her we may need to consider Brenda Russo Bid, or additional eval from ENT &/or GI...     Her Asthma is under good control w/o exac; her only prescription meds include AlbutHFA and NEB w/ Albut0.083% for prn use...    Her weight is the same (216# for BMI=36) and her BP= 120/88 on diet alone; we reviewed low sodium weight reducing diet...  ~  September 03, 2014:  3mo [place] has been doing well- just notes incr allergy symptoms recently, using OTC meds which help;  She is working at [employer], 3rd shift & part time... We reviewed the following medical problems during today's office visit >>     R/O OSA> she notes snoring, tired all the time, & had Sleep Study w/ f/u Brenda Russo- rec oral appliance vs CPAP, she didn't do either; she saw Brenda Russo for ENT as well...     AR/ Asthma> on VENTOLIN HFA prn (& refuses Proair- "it doesn't work for me"); no recent asthma attacks or exac...    HBP> prev on Amlodipine & Hyzaar; BP= 160/84 today but works 3rd shift, home BP checks all wnl she says & ave 120s/70s; she denies HAs, CP, palpit, ch in SOB, edema; she doesn't want meds...    Chol> on diet alone; FLP 4/16 showed TChol 151, TG 49, HDL 51, LDL 90; she has refused med Rx butr  improved on diet- we discussed a low chol/ low fat diet...    Thyroid/ Endocrine> she is s/p thorough endocrine eval by Brenda Russo 2/14- everything was normal, no endocrinopathy found; he rec sleep study & Psyche referral...    Overweight> wt is down to 215# - "I watch what I eat" and we discussed incr exercise...    GI- Abd pain 5/14=> likely IBS> treated w/ Brenda Russo & improved; llast colonoscopy 7/10 by Brenda Russo was neg, f/u planned 10 yrs..    DJD> on Mobic as needed    Anxiety> she was intol to Alprazolam; offered SSRI for chemical imbalance vs Psyche referral but she declines... EXAM reveals Afeb, BP=160/84, wt=215#, ht=5'5";  HEENT- neg, mallampati1;  Chest- clear w/o w/r/r;  Heart-  RR w/o m/r/g;  Abd- soft, neg;  Ext- neg w/o c/c/e...  LABS 08/2014>  Chems- ok x BS=112, A1c=6.0;  FLP- at goals;  VitD=49... IMP/PLAN>>  Brenda Russo is basically stable, continue same Rx...           Problem List:     ALLERGIC RHINITIS (ICD-477.9) - she uses OTC meds as needed.  MILD OSA >>  ~  Sleep Study 6/14 showed AHI=9, mod snoring, transient hypoxemia to 84%, occas PVCs, otherw neg... ~  Sleep consult Brenda Russo 9/14> awakes tired, not rested, & ESS=18; the events were noted to occur mostly during REM; Rec to work on wt reduction & consider dental appliance vs CPAP...   ASTHMA (ICD-493.90) - only using Ventolin HFA as needed (she has NEB w/ Albuterol as well)... she hasn't been checking peak flows etc... she notes that she averages one inhalation daily- usually in the AM... she understands that she is not on a controller drug, but she does not wish to change to Symbicort, Advair, Pulmicort, etc (despite my recommendation to start and stay on one of these medications)...  ~  CXR 10/12 showed norm heart size, clear lungs x mild bilat apical pleural scarring, NAD...   HYPERTENSION (ICD-401.9) - prev on Norvasc & Hyzaar w/ good control of BP, she stopped on her own 4/13... ~  1/12:  BP= 140/94 on Hyzaar100/12.5 & we  decided to add NORVASC 5mg /d... ~  5/12:  Pt reports that she decr the Amlodipine 5mg  to1/2 tab daily & BP checks at home have been good (110/80 here today). ~  EKG 6/12 showed SBrady, rate50, wnl, NAD... ~  10/12:  Pt reports that she decr both meds to 1/2tab daily due to dizziness & improved... ~  4/13:  She tells me she STOPPED both BP meds 3wks ago when she changed jobs at [employer] now on 3rd shift job; states she feels better off the meds; BP= 142/90 & asked to keep log of BP reading for [name] to review together in f/u appt; reviewed lifestyle mod, no salt, wt reduction, etc; a nurse friend suggested that she check her Cortisol level (it was normal). ~  7/13:  BP= 150/100 but she states all BP checks at home have been good; blames it on night shft job & trying to go to day job, remionded to elim salt & get wt down! ~  10/13:  BP= 144/92 & she is fatigued; denies CP, palpit, SOB, edema... ~  10/14: BP is fair on diet alone & measures 132/90 today; weight= 216# which is down 5# from prev; reminded to elim sodium...  HYPERCHOLESTEROLEMIA, BORDERLINE (ICD-272.4) - on diet Rx alone... She refuses med rx for her lipids. ~  FLP 12/08 showed TChol 178, TG 35, HDL 58, LDL 113 ~  FLP 11/10 showed TChol 168, TG 45, HDL 57, LDL 102 ~  FLP 10/11 showed TChol 185, TG 49, HDL 57, LDL 118 ~  FLP 10/12 showed TChol 164, TG 30, HDL 59, LDL 99... Great, keep up the good work! ~  [place] 10/13 showed TChol 192, TG 46, HDL 52, LDL 136... She refuses meds, offered Lipid clinic. ~  [place] 10/14 on diet alone showed TChol 166, TG 37, HDL 56, LDL 102  OVERWEIGHT (ICD-278.02) - we discussed diet + exercise program... ~  weight 1/07 = 181# ~  weight 6/09 = 206# ~  weight 11/10 = 213# ~  weight 10/11 = 213# ~  Weight 5/12 = 219#... Despite numerous diet attempts & fads. ~  Weight 10/12 = 212# ~  Weight 4/13 = 216#... We reviewed low carb, low fat diet. ~  Weight 7/13 = 219# ~  Weight 10/13 = 225# ~  Weight 5/14 = 222# ~   Weight 10/14 = 216#  THYROID >> She presented 10/13 c/o being fatigued, cold, etc & she read on the internet that this could be hypothyroidism; I explained to her that her thyroid function as been normal (normal TSH) & this rules out hypothy, but she rejects this, says she read on the internet from "[employer]" that she needs a ReverseT3 checked but I declined to order this test;  She wants to have an Endocrine consult regarding her thyroid & we will refer per her request... ~  Labs 10/13 showed TSH= 2.26; Thyroid exam is neg- no goiter, no nodules; she started taking Iodine on her own & I advised her to stop... ~  1-2/14:  She had Endocrine eval by Brenda Russo> notes reviewed- no evid for any endocrinopathy (thyroid, adrenal, pituitary); she is c/o fatigue, depressed mood, difficulty concentrating, impaired memory, etc; also noted snoring & wanted her to get a sleep study & consider antidepressant meds vs Psyche referral... (Sleep study w/ AHI=9 & she is considering Rx w/ oral appliance vs CPAP while trying to lose the weight). ~  Labs 10/14 showed TSH= 1.23  GI >> Routine colonoscopy 7/10 by Brenda Russo done for rectal bleeding; Colon was normal/ negative/ no abnormalities seen & repeat rec in 24yrs...  DEGENERATIVE ARTHRITIS>  She has DJD/ OA & uses OTC anti-inflamm meds; prev tried Mobic 7.5mg  Prn... ~  Labs 10/14 showed VitD level = 29 and she is encouraged to incr the VitD supplement to 2000u daily.  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 ~  5/14:  We reviewed Brenda Russo's assessment & offered meds for depression but she declines, offered Psyche referral for "chemical imbalance" but she declines...  GENERAL HEALTH:  she sees Brenda Russo for GYN & she is due PAP, Mammograms, etc... had colonoscopy 7/10 by Brenda Russo= neg, no polyps etc... f/u plannned 53yrs.   Past Surgical History  Procedure Laterality Date   . Cesarean section    . Tubal ligation      Outpatient Encounter Prescriptions as of 09/03/2014  Medication Sig  . albuterol (PROVENTIL) (2.5 MG/3ML) 0.083% nebulizer solution Take 3 mLs (2.5 mg total) by nebulization 3 (three) times daily as needed.  Marland Kitchen albuterol (VENTOLIN HFA) 108 (90 BASE) MCG/ACT inhaler INHALE 2 PUFFS INTO THE LUNGS EVERY 4 HOURS AS NEEDED.  . B Complex-C-Folic Acid (B-COMPLEX BALANCED PO) Take by mouth.  . Cholecalciferol (VITAMIN D) 2000 UNITS CAPS Take 1 capsule by mouth daily.  . magnesium oxide (MAG-OX) 400 MG tablet 400-800 mg daily  . Multiple Vitamins-Minerals (COMPLETE WOMENS PO) Take 1 capsule by mouth 2 (two) times daily.    . [DISCONTINUED] CALCIUM PO Take 250 mg by mouth daily.   . [DISCONTINUED] hydrocortisone 2.5 % cream Apply topically 2 (two) times daily. (Patient not taking: Reported on 09/03/2014)  . [DISCONTINUED] ketoconazole (NIZORAL) 2 % cream Apply 1 application topically daily. (Patient not taking: Reported on 09/03/2014)  . [DISCONTINUED] magnesium 30 MG tablet Take 400 mg by mouth 2 (two) times daily.   No facility-administered encounter medications on file as of 09/03/2014.    Allergies  Allergen Reactions  . Eggs Or Egg-Derived Products     Shortness of breath, wheezing    Current Medications, Allergies, Past Medical History, Past Surgical History, Family History, and Social History were reviewed in [employer] record.    Review of Systems      See HPI - all other systems neg except as noted...  The patient denies anorexia, fever, weight loss, weight gain, vision loss, decreased hearing, hoarseness, chest pain, syncope, dyspnea on exertion, peripheral edema, prolonged cough, headaches, hemoptysis, abdominal pain, melena, hematochezia, severe indigestion/heartburn, hematuria, incontinence, muscle weakness, suspicious skin lesions, transient blindness, difficulty walking, depression, unusual weight change, abnormal  bleeding, enlarged lymph nodes, and angioedema.   Objective:   Physical Exam     WD, WN, 59 y/o BF in NAD... GENERAL:  Alert & oriented; pleasant & cooperative... HEENT:  [place]/AT, EOM-wnl, PERRLA, EACs-clear, TMs-wnl, NOSE-clear, THROAT-clear & wnl. NECK:  Supple w/ full ROM; no JVD; normal carotid impulses w/o bruits; no thyromegaly or nodules palpated; no lymphadenopathy. CHEST:  Clear to P & A; without wheezes or rales; fe scat rhonchi heard... HEART:  Regular Rhythm; without murmurs/ rubs/ or gallops detected... ABDOMEN:  Soft & nontender; normal bowel sounds; no organomegaly or masses palpated... EXT: without deformities or arthritic changes; no varicose veins/ +venous insuffic/ no edema. NEURO:  CN's intact; motor testing normal; sensory testing normal; gait normal & balance OK. DERM:  No lesions noted; no rash etc...  RADIOLOGY DATA:  Reviewed in the EPIC EMR & discussed w/ the patient...  LABORATORY DATA:  Reviewed in the EPIC EMR & discussed w/ the patient...   Assessment & Plan:    Hx CAROTIDYNIA>> Hx and exam c/w this diagnosis & we will treat w/ rest/ heat/ Pred dosepak; there are some atyp features (eg-dysphagia, sore throat) & we will try  MMW for prn use; I indicated that she may need Brenda Russo trial as well; if not responding then we will refer to ENT vs GI...    Mild OSA>  She is considering her options for oral appliance vs CPAP while she tries to lose the weight...  ASTHMA>  She is clear today, control seems reasonable, using prn inhaler vs Neb...  HBP>  She clearly does not want meds! Stopped both BP meds on her own when she went to 3rd shift job; we reviewed lifestyle mod, no salt, diet efforts & encouraged wt reduction; she will monitor BP & keep a log for [name] to review... Advised that she MUST lose wt & elim salt to keep her BP down...  CHOL>  on diet alone w/ FLP improvedl;  Needs better diet & get wt down!!!  OVERWEIGHT>  Wt reduction is key to her BP/ DM/  etc...  GI- HxGERD, LLQ pain>  She is currently denying GERD, reflux symptoms, etc;  CT Abd 6/14 was essent neg=> symptoms resolved w/ Brenda Russo Rx...  DJD>  She uses OTC analgesics as needed; offered Ortho consult when ready...  ANXIETY>  She does not want med Rx or Psyche referral...   Patient's Medications  New Prescriptions   No medications on file  Previous Medications   B COMPLEX-C-FOLIC ACID (B-COMPLEX BALANCED PO)    Take by mouth.   CHOLECALCIFEROL (VITAMIN D) 2000 UNITS CAPS    Take 1 capsule by mouth daily.   MAGNESIUM OXIDE (MAG-OX) 400 MG TABLET    400-800 mg daily   MULTIPLE VITAMINS-MINERALS (COMPLETE WOMENS PO)    Take 1 capsule by mouth 2 (two) times daily.    Modified Medications   Modified Medication Previous Medication   ALBUTEROL (PROVENTIL) (2.5 MG/3ML) 0.083% NEBULIZER SOLUTION albuterol (PROVENTIL) (2.5 MG/3ML) 0.083% nebulizer solution      Take 3 mLs (2.5 mg total) by nebulization 3 (three) times daily as needed.    Take 3 mLs (2.5 mg total) by nebulization 3 (three) times daily as needed.   ALBUTEROL (VENTOLIN HFA) 108 (90 BASE) MCG/ACT INHALER albuterol (VENTOLIN HFA) 108 (90 BASE) MCG/ACT inhaler      INHALE 2 PUFFS INTO THE LUNGS EVERY 4 HOURS AS NEEDED.    INHALE 2 PUFFS INTO THE LUNGS EVERY 4 HOURS AS NEEDED.  Discontinued Medications   CALCIUM PO    Take 250 mg by mouth daily.    HYDROCORTISONE 2.5 % CREAM    Apply topically 2 (two) times daily.   KETOCONAZOLE (NIZORAL) 2 % CREAM    Apply 1 application topically daily.   MAGNESIUM 30 MG TABLET    Take 400 mg by mouth 2 (two) times daily.

## 2014-12-10 ENCOUNTER — Other Ambulatory Visit: Payer: Self-pay

## 2014-12-10 VITALS — BP 138/96 | HR 52 | Resp 14 | Ht 65.0 in | Wt 218.2 lb

## 2014-12-10 DIAGNOSIS — I10 Essential (primary) hypertension: Secondary | ICD-10-CM

## 2014-12-10 NOTE — Patient Outreach (Signed)
[place] [facility]) Care Management   12/10/2014  Brenda Russo [birth year] [id number]  Brenda Russo is an 59 y.o. female.  Member seen for initial office visit for Link to Wellness program for self management of hypertension  Subjective: Member states that she would like to be healthier and control her blood pressure without going on medications.  States she would like to lose weight.  States that she has been walking some and tries to get 10,000 steps a day when she can.  States her blood pressures go up and down.  Member states that her asthma is usually under control and she is very careful to avoid triggers.    Objective:   Review of Systems  All other systems reviewed and are negative.   Physical Exam  Today's Vitals   12/10/14 0952  BP: 138/96  Pulse: 52  Resp: 14  Height: 1.651 m (5\' 5" )  Weight: 218 lb 3.2 oz (98.975 kg)  SpO2: 98%  PainSc: 0-No pain    Current Medications:   Current Outpatient Prescriptions  Medication Sig Dispense Refill  . albuterol (PROVENTIL) (2.5 MG/3ML) 0.083% nebulizer solution Take 3 mLs (2.5 mg total) by nebulization 3 (three) times daily as needed. 75 mL 6  . albuterol (VENTOLIN HFA) 108 (90 BASE) MCG/ACT inhaler INHALE 2 PUFFS INTO THE LUNGS EVERY 4 HOURS AS NEEDED. 3 Inhaler 3  . B Complex-C-Folic Acid (B-COMPLEX BALANCED PO) Take by mouth.    . Cholecalciferol (VITAMIN D3) 5000 UNITS CAPS Take 1 capsule by mouth daily.    . magnesium oxide (MAG-OX) 400 MG tablet 2 tablets daily. 400-800 mg daily    . Multiple Vitamins-Minerals (COMPLETE WOMENS PO) Take 1 capsule by mouth 2 (two) times daily.      . Cholecalciferol (VITAMIN D) 2000 UNITS CAPS Take 1 capsule by mouth daily.     No current facility-administered medications for this visit.    Functional Status:   In your present state of health, do you have any difficulty performing the following activities: 12/10/2014  Hearing? N  Vision? N  Difficulty concentrating  or making decisions? N  Walking or climbing stairs? N  Dressing or bathing? N  Doing errands, shopping? N    Fall/Depression Screening:    PHQ 2/9 Scores 12/10/2014 08/20/2014 07/05/2012  PHQ - 2 Score 0 0 0    Assessment:  Member seen for initial office visit for Link to Wellness program for self management of hypertension.  Member has wrist B/P monitor which varies in her readings.  Member reports struggling with weight loss and portion control.  Reports walking around house and walking the dog.  Reports her asthma is under control.    Plan:   Plan to eat the healthy plate method and the dietary approaches to reducing hypertension  diet. Plan to consider joining [employer]. Plan to walk 30 minutes 3 days a week.  Goal of 150 minutes a week Plan to pick up blood pressure monitor at pharmacy. Plan to check B/P daily at different times and write on log.  Plan to bring log to next visit Plan to complete EMMI programs by 02/10/15 Plan to return to Link to Wellness on 02/10/15 at 9:30AM  [place] Problem One        Most Recent Value   Care Plan Problem One  Elevated blood pressures   Role Documenting the Problem One  Care Management [place] for Problem One  Active  [facility] Long Term Goal (31-90 days)  Member will lower blood pressure below 140/90 80% of readings as evidenced by log   [facility] Long Term Goal Start Date  12/10/14   Interventions for Problem One Long Term Goal  Given Link to Wellness education packet on hypertension and reviewed program requirements, Given voucher for B/P monitor and instructed on how to pick up at pharmacy, Instructed on goals for B/P and to check her B/P daily at different times, Given log and instructed to bring log to next visit,  Instructed on the dietary approaches to stopping hypertension, Instructed on limiting CHO and watching portion sizes,  Instructed on how even losing 5- 10% weight can effect her B/P and blood sugars, Given handout  on how to join [employer] with the [facility] discount, Instructed on importance of regular exercise to help control her B/P and lose weight      Peter Garter RN, [facility] Care Management Coordinator-Link to [place] Management [phone]

## 2014-12-10 NOTE — Patient Instructions (Addendum)
1. Plan to eat the healthy plate method and the dietary approached to stopping hypertension diet. 2. Plan to consider joining [employer]. 3. Plan to walk 30 minutes 3 days a week.  Goal of 150 minutes a week 4. Plan to pick up blood pressure monitor at pharmacy. 5. Plan to check B/P daily at different times and write on log.  Plan to bring log to next visit 6. Plan to complete EMMI programs by 02/10/15 7. Plan to return to Link to Wellness on 02/10/15 at 9:30AM

## 2015-02-10 ENCOUNTER — Other Ambulatory Visit: Payer: Self-pay

## 2015-02-10 VITALS — BP 156/96 | HR 56 | Resp 16 | Ht 65.0 in | Wt 217.8 lb

## 2015-02-10 DIAGNOSIS — I10 Essential (primary) hypertension: Secondary | ICD-10-CM

## 2015-02-10 NOTE — Patient Outreach (Signed)
[facility]) Care Management   02/10/2015  Brenda Russo [birth year] [id number]  Brenda Russo is an 60 y.o. female.   Member seen for follow up office visit for Link to Wellness program for self management of Hypertension  Subjective:   Member states that she has been checking her B/P daily and her numbers go up and down.  States she did join the [employer] and had been going to classes or using the work out equipment a few times a week but she stopped during the holidays.  States she is trying to follow a low sodium diet.    Objective:   Review of Systems  All other systems reviewed and are negative. Reviewed B/P log with high of 180/103 and low reading of 101/64  Physical Exam  Today's Vitals   02/10/15 0943  BP: 156/96  Pulse: 56  Resp: 16  Height: 1.651 m (5\' 5" )  Weight: 217 lb 12.8 oz (98.793 kg)  SpO2: 97%  PainSc: 0-No pain    Current Medications:   Current Outpatient Prescriptions  Medication Sig Dispense Refill  . albuterol (PROVENTIL) (2.5 MG/3ML) 0.083% nebulizer solution Take 3 mLs (2.5 mg total) by nebulization 3 (three) times daily as needed. 75 mL 6  . albuterol (VENTOLIN HFA) 108 (90 BASE) MCG/ACT inhaler INHALE 2 PUFFS INTO THE LUNGS EVERY 4 HOURS AS NEEDED. 3 Inhaler 3  . Ascorbic Acid (VITAMIN C) 1000 MG tablet Take 1,000 mg by mouth daily.    . B Complex-C-Folic Acid (B-COMPLEX BALANCED PO) Take by mouth.    . Cholecalciferol (VITAMIN D3) 5000 UNITS CAPS Take 1 capsule by mouth daily.    . magnesium oxide (MAG-OX) 400 MG tablet 2 tablets daily. 400-800 mg daily    . Multiple Vitamins-Minerals (COMPLETE WOMENS PO) Take 1 capsule by mouth 2 (two) times daily.      . Cholecalciferol (VITAMIN D) 2000 UNITS CAPS Take 1 capsule by mouth daily. Reported on 02/10/2015     No current facility-administered medications for this visit.    Functional Status:   In your present state of health, do you have any difficulty performing the  following activities: 02/10/2015 12/10/2014  Hearing? N N  Vision? N N  Difficulty concentrating or making decisions? N N  Walking or climbing stairs? N N  Dressing or bathing? N N  Doing errands, shopping? N N    Fall/Depression Screening:    PHQ 2/9 Scores 02/10/2015 12/10/2014 08/20/2014 07/05/2012  PHQ - 2 Score 0 0 0 0    Assessment:   Member seen for follow up office visit for Link to Wellness program for self management of Hypertension.  Member continues to have B/P readings above goal of 140/90 or less.  Reports increasing exercise and following a low sodium diet.  Member not on any antihypertensive medications currently.    Plan:   Plan to eat the healthy plate method and the Dietary Approaches to stop hypertension diet. Plan to work out at [employer] 60 minutes 2 days a week.  Goal of 150 minutes a week Plan to check B/P daily at different times and write on log.  Plan to bring log to next visit Plan to keep appointment with Dr. Larose Kells on 04/30/15 Plan to return to Link to Wellness on 05/11/15 at 9:30AM   [facility] CM Care Plan Problem One        Most Recent Value   Care Plan Problem One  Elevated blood pressures   Role  Documenting the Problem One  Care Management [place] for Problem One  Active   THN Long Term Goal (31-90 days)  Member will lower blood pressure below 140/90 80% of readings as evidenced by log   THN Long Term Goal Start Date  02/10/15 Brenda Russo continues to have readings above 140/90]   Interventions for Problem One Long Term Goal   Reinforced on goals for B/P and to check her B/P daily at different times, Reinforced to bring log to next visit,  Reinforced to follow the dietary approaches to stopping hypertension, Reinforced that even losing 5- 10% weight can effect her B/P and blood sugars,Reinforced on importance of regular exercise to help control her B/P and lose weight , Instructed to keep her appointment with Dr.Paz on 04/30/15 and that he might consider  putting her on medication for her B/P if her readings do not decrease, Instructed to take B/P logs to MD appointment    Peter Garter RN, [facility] Care Management Coordinator-Link to [place] Management [phone]

## 2015-02-10 NOTE — Patient Instructions (Signed)
1. Plan to eat the healthy plate method and the Dietary Approaches to stop hypertension diet. 2. Plan to work out at [employer] 60 minutes 2 days a week.  Goal of 150 minutes a week 3. Plan to check B/P daily at different times and write on log.  Plan to bring log to next visit 4. Plan to keep appointment with Dr. Larose Kells on 04/30/15 5. Plan to return to Link to Wellness on 05/11/15 at 9:30AM

## 2015-02-12 MED FILL — DESONIDE 0.05% OINTMENT: 0.05 | 14 days supply | Qty: 60 | Fill #0

## 2015-02-12 MED FILL — VENTOLIN HFA 90 MCG INHALER: 108 (90 BAS | 90 days supply | Qty: 54 | Fill #2

## 2015-02-13 ENCOUNTER — Telehealth: Payer: Self-pay | Admitting: Internal Medicine

## 2015-02-13 MED FILL — TRIAMCINOLONE 0.1% OINTMENT: 0.1 | 14 days supply | Qty: 240 | Fill #0

## 2015-02-13 NOTE — Telephone Encounter (Signed)
Pt left VM 02/13/15 9:27am about an appt with Dr. Larose Kells, did not specify if it was to schedule or cancel. Left msg to notify pt next appt is 05/20/15 3:00pm with Dr. Larose Kells scheduled thru [place].

## 2015-03-18 ENCOUNTER — Encounter: Payer: Self-pay | Admitting: Gastroenterology

## 2015-03-19 ENCOUNTER — Telehealth: Payer: Self-pay | Admitting: *Deleted

## 2015-03-19 NOTE — Telephone Encounter (Signed)
Unable to reach patient at time of call. No message left as no DPR on file for [facility].

## 2015-04-30 ENCOUNTER — Encounter: Payer: Self-pay | Admitting: Internal Medicine

## 2015-05-11 ENCOUNTER — Ambulatory Visit: Payer: Self-pay

## 2015-05-20 ENCOUNTER — Ambulatory Visit (INDEPENDENT_AMBULATORY_CARE_PROVIDER_SITE_OTHER): Payer: 59 | Admitting: Internal Medicine

## 2015-05-20 ENCOUNTER — Encounter: Payer: Self-pay | Admitting: Internal Medicine

## 2015-05-20 VITALS — BP 128/74 | HR 64 | Temp 97.8°F | Ht 65.0 in | Wt 213.4 lb

## 2015-05-20 DIAGNOSIS — Z0000 Encounter for general adult medical examination without abnormal findings: Secondary | ICD-10-CM

## 2015-05-20 DIAGNOSIS — Z09 Encounter for follow-up examination after completed treatment for conditions other than malignant neoplasm: Secondary | ICD-10-CM | POA: Insufficient documentation

## 2015-05-20 DIAGNOSIS — Z780 Asymptomatic menopausal state: Secondary | ICD-10-CM

## 2015-05-20 DIAGNOSIS — E559 Vitamin D deficiency, unspecified: Secondary | ICD-10-CM

## 2015-05-20 DIAGNOSIS — R739 Hyperglycemia, unspecified: Secondary | ICD-10-CM

## 2015-05-20 MED ORDER — LOSARTAN POTASSIUM-HCTZ 100-12.5 MG PO TABS: 1.0000 | ORAL_TABLET | Freq: Every day | ORAL | Status: DC

## 2015-05-20 MED ORDER — AMLODIPINE BESYLATE 5 MG PO TABS: 5.0000 mg | ORAL_TABLET | Freq: Every day | ORAL | Status: DC

## 2015-05-20 MED ORDER — ALBUTEROL SULFATE HFA 108 (90 BASE) MCG/ACT IN AERS: 2.0000 | INHALATION_SPRAY | RESPIRATORY_TRACT | Status: DC | PRN

## 2015-05-20 MED ORDER — ALBUTEROL SULFATE (2.5 MG/3ML) 0.083% IN NEBU: 2.5000 mg | INHALATION_SOLUTION | Freq: Three times a day (TID) | RESPIRATORY_TRACT | Status: DC | PRN

## 2015-05-20 MED FILL — LOSARTAN-HCTZ 100-12.5 MG T: 100-12.5 | 30 days supply | Qty: 30 | Fill #0

## 2015-05-20 MED FILL — VENTOLIN HFA 90 MCG INHALER: 108 (90 BAS | 25 days supply | Qty: 18 | Fill #0

## 2015-05-20 MED FILL — AMLODIPINE BESYLATE 5 MG TA: 5 | 30 days supply | Qty: 30 | Fill #0

## 2015-05-20 NOTE — Patient Instructions (Signed)
GO TO THE FRONT DESK Schedule labs to be done in 10 days from today, fasting Schedule your next appointment for a  routine checkup, 6 months from today, no fasting   Continue taking the blood pressure medications as you're doing  Check the  blood pressure  weekly Be sure your blood pressure is between 110/65 and  145/85. If it is consistently higher or lower, let me know

## 2015-05-20 NOTE — Progress Notes (Signed)
Pre visit review using our clinic review tool, if applicable. No additional management support is needed unless otherwise documented below in the visit note. 

## 2015-05-20 NOTE — Assessment & Plan Note (Signed)
HTN: BP was consistently elevated lately, she self restart a old  prescription for losartan, HCTZ and amlodipine. BP today is very good, will continue with present care and check a BMP in 10 days. EKG today showed sinus brady Vitamin D deficiency, history of? Check levels Left lower quadrant abdominal pain as described above, resolved, exam normal, observation  RTC 6 months

## 2015-05-20 NOTE — Progress Notes (Signed)
Subjective:    Patient ID: Brenda Russo, female    DOB: 12/30/1955, 60 y.o.   MRN: [id number]  DOS:  05/20/2015 Type of visit - description : CPX Interval history: Checks her BP regularly, about 10 days ago noted that he was seen consistently in the 150s 160s, restarted   losartan HCT and amlodipine. BP is now are very good,   Review of Systems 2 weeks ago developed pain at the left lower quadrant, very close to the groin, it self resolves. Review systems is otherwise negative including no fever, chills, UTI or GU type of sx   Constitutional: No fever. No chills. No unexplained wt changes. No unusual sweats  HEENT: No dental problems, no ear discharge, no facial swelling, no voice changes. No eye discharge, no eye  redness , no  intolerance to light   Respiratory: No wheezing , no  difficulty breathing. No cough , no mucus production  Cardiovascular: No CP, no leg swelling , no  Palpitations  GI: no nausea, no vomiting, no diarrhea  No blood in the stools. No dysphagia, no odynophagia    Endocrine: No polyphagia, no polyuria , no polydipsia  GU: No dysuria, gross hematuria, difficulty urinating. No urinary urgency, no frequency.  Musculoskeletal: No joint swellings or unusual aches or pains  Skin: No change in the color of the skin, palor , no  Rash  Allergic, immunologic: No environmental allergies , no  food allergies  Neurological: No dizziness no  syncope. No headaches. No diplopia, no slurred, no slurred speech, no motor deficits, no facial  Numbness  Hematological: No enlarged lymph nodes, no easy bruising , no unusual bleedings  Psychiatry: No suicidal ideas, no hallucinations, no beavior problems, no confusion.  No unusual/severe anxiety, no depression   Past Medical History  Diagnosis Date  . Allergic rhinitis   . Asthma   . Hyperlipidemia     borderline  . Overweight(278.02)   . Plantar wart     history  . Anxiety   . HTN (hypertension)   . OSA  (obstructive sleep apnea)     mild ,no CPAP    Past Surgical History  Procedure Laterality Date  . Cesarean section    . Tubal ligation      Social History   Social History  . Marital Status: Married    Spouse Name: N/A  . Number of Children: 7  . Years of Education: N/A   Occupational History  . nurse secretary at [employer]  . Smoking status: Never Smoker   . Smokeless tobacco: Never Used  . Alcohol Use: No  . Drug Use: No  . Sexual Activity: Not Currently   Other Topics Concern  . Not on file   Social History Narrative   3 biological children   1 adopted child   3 step-children   Household-- pt , husband and a daughter            Family History  Problem Relation Age of Onset  . Pancreatic cancer Brother   . Breast cancer Other     Aunt  . Heart disease Mother     pacemaker   . Hypertension Mother   . Hyperlipidemia Mother   . Asthma Father   . Colon cancer Neg Hx       Medication List       This list is accurate as of: 05/20/15  7:48 PM.  Always use your  most recent med list.               albuterol (2.5 MG/3ML) 0.083% nebulizer solution  Commonly known as:  PROVENTIL  Take 3 mLs (2.5 mg total) by nebulization 3 (three) times daily as needed.     albuterol 108 (90 Base) MCG/ACT inhaler  Commonly known as:  VENTOLIN HFA  Inhale 2 puffs into the lungs every 4 (four) hours as needed for wheezing or shortness of breath.     amLODipine 5 MG tablet  Commonly known as:  NORVASC  Take 1 tablet (5 mg total) by mouth daily.     B-COMPLEX BALANCED PO  Take by mouth.     COMPLETE WOMENS PO  Take 1 capsule by mouth 2 (two) times daily.     losartan-hydrochlorothiazide 100-12.5 MG tablet  Commonly known as:  HYZAAR  Take 1 tablet by mouth daily.     magnesium oxide 400 MG tablet  Commonly known as:  MAG-OX  2 tablets daily. 400-800 mg daily     vitamin C 1000 MG tablet  Take 1,000 mg by mouth daily.       Vitamin D3 5000 units Caps  Take 1 capsule by mouth daily.           Objective:   Physical Exam BP 128/74 mmHg  Pulse 64  Temp(Src) 97.8 F (36.6 C) (Oral)  Ht 5\' 5"  (1.651 m)  Wt 213 lb 6 oz (96.786 kg)  BMI 35.51 kg/m2  SpO2 97%  General:   Well developed, well nourished . NAD.  Neck: No  Thyromegaly  HEENT:  Normocephalic . Face symmetric, atraumatic Lungs:  CTA B Normal respiratory effort, no intercostal retractions, no accessory muscle use. Heart: RRR,  no murmur.  No pretibial edema bilaterally  Abdomen:  Not distended, soft, non-tender. No rebound or rigidity.   Groin: No hernias Skin: Exposed areas without rash. Not pale. Not jaundice Neurologic:  alert & oriented X3.  Speech normal, gait appropriate for age and unassisted Strength symmetric and appropriate for age.  Psych: Cognition and judgment appear intact.  Cooperative with normal attention span and concentration.  Behavior appropriate. No anxious or depressed appearing.    Assessment & Plan:   Assessment Prediabetes A1c 6.0 (05/2014 HTN Hyperlipidemia Anxiety Obesity Asthma  OSA, no CPAP BTL  Plan: HTN: BP was consistently elevated lately, she self restart a old  prescription for losartan, HCTZ and amlodipine. BP today is very good, will continue with present care and check a BMP in 10 days. EKG today showed sinus brady Vitamin D deficiency, history of? Check levels Left lower quadrant abdominal pain as described above, resolved, exam normal, observation  RTC 6 months

## 2015-05-20 NOTE — Assessment & Plan Note (Addendum)
Tdap ,pnm shot --declined  again today , benefits discussed   Female care- due to see gyn, rec to schedule Pap--01/07/14 with Dr. Garwin Brothers- per patient normal  MMG--2015 per pt  CCS--08/19/08- normal, Dr Ardis Hughs,  follow-up in 10 years dexa rx last year,not done,  re order  Diet-exercise discussed   Labs in 10 days: CMP, FLP, A1c, vitamin D

## 2015-05-25 MED FILL — ALBUTEROL 0.083% INHAL SOLN: (2.5 MG/3ML | 8 days supply | Qty: 75 | Fill #0

## 2015-05-29 ENCOUNTER — Other Ambulatory Visit: Payer: 59

## 2015-06-03 ENCOUNTER — Other Ambulatory Visit (INDEPENDENT_AMBULATORY_CARE_PROVIDER_SITE_OTHER): Payer: 59

## 2015-06-03 ENCOUNTER — Other Ambulatory Visit: Payer: 59

## 2015-06-03 DIAGNOSIS — Z0000 Encounter for general adult medical examination without abnormal findings: Secondary | ICD-10-CM

## 2015-06-03 DIAGNOSIS — E559 Vitamin D deficiency, unspecified: Secondary | ICD-10-CM | POA: Diagnosis not present

## 2015-06-03 DIAGNOSIS — R739 Hyperglycemia, unspecified: Secondary | ICD-10-CM | POA: Diagnosis not present

## 2015-06-03 LAB — LIPID PANEL
Cholesterol: 168 mg/dL (ref 0–200)
HDL: 56.3 mg/dL (ref >39.00)
LDL CALC: 103 mg/dL — AB (ref 0–99)
NONHDL: 112.07
Total CHOL/HDL Ratio: 3
Triglycerides: 45 mg/dL (ref 0.0–149.0)
VLDL: 9 mg/dL (ref 0.0–40.0)

## 2015-06-03 LAB — COMPREHENSIVE METABOLIC PANEL
ALBUMIN: 4 g/dL (ref 3.5–5.2)
ALK PHOS: 80 U/L (ref 39–117)
ALT: 15 U/L (ref 0–35)
AST: 20 U/L (ref 0–37)
BILIRUBIN TOTAL: 0.4 mg/dL (ref 0.2–1.2)
BUN: 18 mg/dL (ref 6–23)
CO2: 30 mEq/L (ref 19–32)
Calcium: 9.1 mg/dL (ref 8.4–10.5)
Chloride: 103 mEq/L (ref 96–112)
Creatinine, Ser: 0.75 mg/dL (ref 0.40–1.20)
GFR: 101.88 mL/min (ref >60.00)
GLUCOSE: 100 mg/dL — AB (ref 70–99)
POTASSIUM: 3.8 meq/L (ref 3.5–5.1)
Sodium: 140 mEq/L (ref 135–145)
TOTAL PROTEIN: 7.7 g/dL (ref 6.0–8.3)

## 2015-06-03 LAB — HEMOGLOBIN A1C: HEMOGLOBIN A1C: 6.2 % (ref 4.6–6.5)

## 2015-06-05 LAB — VITAMIN D 1,25 DIHYDROXY
VITAMIN D3 1, 25 (OH): 33 pg/mL
Vitamin D 1, 25 (OH)2 Total: 33 pg/mL (ref 18–72)

## 2015-06-10 ENCOUNTER — Other Ambulatory Visit: Payer: Self-pay

## 2015-06-10 VITALS — BP 118/84 | HR 62 | Resp 16 | Ht 65.0 in | Wt 210.4 lb

## 2015-06-10 DIAGNOSIS — R7302 Impaired glucose tolerance (oral): Secondary | ICD-10-CM

## 2015-06-10 DIAGNOSIS — I10 Essential (primary) hypertension: Secondary | ICD-10-CM

## 2015-06-10 NOTE — Patient Instructions (Signed)
1. Plan to eat the healthy plate method and the Dietary Approaches to stop hypertension diet. 2. Plan to eat 30-45 GM (2-3 servings) of carbohydrates a meal and 15GM for snacks.  Plan eat protein with snacks. 3. Plan to walk 12,000 steps a day   4. Plan to check B/P daily at different times and write on log.  Plan to bring log to next visit 5. Plan to complete EMMI programs by 08/07/15 6. Plan to look into signing up for the Diabetes Prevention Program 7. Plan to return to Link to Wellness on 09/08/15 at 9:30AM

## 2015-06-10 NOTE — Patient Outreach (Signed)
[facility]) Care Management   06/10/2015  Brenda Russo 1955/12/14 [id number]  Brenda Russo is an 60 y.o. female.   Member seen for follow up office visit for Link to Wellness program for self management of Hypertension and prediabetes  Subjective: Member states that she saw Dr.Paz last month for her physical.  States she is now back on B/P medications and her blood pressure has been much better.  States that her hemoglobin A1C up to 6.2% and she does not want it go go any higher.  States she is wearing an activity tracker and she is averaging 12,000 steps a day.  States she is watching the sodium in her diet.  States she does not know much about what foods to watch for her blood sugar.     Objective:   Review of Systems  All other systems reviewed and are negative.   Physical Exam Today's Vitals   06/10/15 0941  BP: 118/84  Pulse: 62  Resp: 16  Height: 1.651 m (5\' 5" )  Weight: 210 lb 6.4 oz (95.437 kg)  SpO2: 98%  PainSc: 0-No pain   Encounter Medications:   Outpatient Encounter Prescriptions as of 06/10/2015  Medication Sig  . albuterol (PROVENTIL) (2.5 MG/3ML) 0.083% nebulizer solution Take 3 mLs (2.5 mg total) by nebulization 3 (three) times daily as needed.  Marland Kitchen albuterol (VENTOLIN HFA) 108 (90 Base) MCG/ACT inhaler Inhale 2 puffs into the lungs every 4 (four) hours as needed for wheezing or shortness of breath.  Marland Kitchen amLODipine (NORVASC) 5 MG tablet Take 1 tablet (5 mg total) by mouth daily.  . Ascorbic Acid (VITAMIN C) 1000 MG tablet Take 1,000 mg by mouth daily.  . B Complex-C-Folic Acid (B-COMPLEX BALANCED PO) Take by mouth.  . Cholecalciferol (VITAMIN D3) 5000 UNITS CAPS Take 1 capsule by mouth daily.  . Coenzyme Q10 (COQ10) 100 MG CAPS Take 1 capsule by mouth daily.  Marland Kitchen losartan-hydrochlorothiazide (HYZAAR) 100-12.5 MG tablet Take 1 tablet by mouth daily.  . magnesium oxide (MAG-OX) 400 MG tablet 2 tablets daily. 400-800 mg daily  . Multiple  Minerals-Vitamins (CALCIUM CITRATE PLUS/MAGNESIUM) TABS Take 1 tablet by mouth.  . Multiple Vitamins-Minerals (COMPLETE WOMENS PO) Take 1 capsule by mouth 2 (two) times daily.    . Omega-3 Fatty Acids (FISH OIL) 1000 MG CAPS Take 1 capsule by mouth.   No facility-administered encounter medications on file as of 06/10/2015.    Functional Status:   In your present state of health, do you have any difficulty performing the following activities: 05/20/2015 02/10/2015  Hearing? N N  Vision? N N  Difficulty concentrating or making decisions? N N  Walking or climbing stairs? N N  Dressing or bathing? N N  Doing errands, shopping? N N    Fall/Depression Screening:    PHQ 2/9 Scores 06/10/2015 05/20/2015 02/10/2015 12/10/2014 08/20/2014 07/05/2012  PHQ - 2 Score 0 0 0 0 0 0    Assessment:  Member seen for follow up office visit for Link to Wellness program for self management of Hypertension and prediabetes. Member is now is back on B/P medications and she is meeting  goal of B/P readings  of  140/90 or less.Member had hemoglobin A1C increased to 6.2% with last MD visit. Reports getting average of 12,000 steps a day and following a low sodium diet.  Member has poor knowledge of carbohydrates and ways to help improve her hemoglobin A1C.   Plan:  Plan to eat the healthy plate method and  the Dietary Approaches to stop hypertension diet. Plan to eat 30-45 GM (2-3 servings) of carbohydrates a meal and 15GM for snacks.  Plan eat protein with snacks. Plan to walk 12,000 steps a day   Plan to check B/P daily at different times and write on log.  Plan to bring log to next visit Plan to complete EMMI programs by 08/07/15 Plan to look into signing up for the Diabetes Prevention Program Plan to return to Link to Wellness on 09/08/15 at 9:30AM  [place] Problem One        Most Recent Value   Care Plan Problem One  Elevated blood pressures   Role Documenting the Problem One  Care Management [place] for Problem One  Active   THN Long Term Goal (31-90 days)  Member will lower blood pressure below 140/90 80% of readings as evidenced by log   THN Long Term Goal Start Date  06/10/15 [continued improved readings since starting medications]   Interventions for Problem One Long Term Goal   Reinforced on goals for B/P and to check her B/P daily at different times, Reinforced to bring log to next visit,  Reinforced to follow the dietary approaches to stopping hypertension, Reinforced that even losing 5- 10% weight can effect her B/P and blood sugars,Reinforced on importance of regular exercise to help control her B/P and lose weight , Instructed on B/P medications and how they effect her B/P, Instructed to call MD if she starts to have frequent low B/P readings    [facility] CM Care Plan Problem Two        Most Recent Value   Care Plan Problem Two  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   Role Documenting the Problem Two  Care Management Coordinator   Care Plan for Problem Two  Active   Interventions for Problem Two Long Term Goal   Instructed on what foods have CHO and CHO counting, Given booklet on CHO counting, Encouraged to eat more protein and to have protein with her snacks,  Instructed on ways to decrease her hemoglobin A1C through weight loss and exercise, Given information on how to sign up for the Diabetes Prevention Program,    THN Long Term Goal (31-90) days  Member will decrease hemoglobin A1C to 6 or below in the next 90 days   [facility] Long Term Goal Start Date  06/10/15    Peter Garter RN, [facility] Care Management Coordinator-Link to [place] Management [phone]

## 2015-06-27 ENCOUNTER — Ambulatory Visit (INDEPENDENT_AMBULATORY_CARE_PROVIDER_SITE_OTHER): Payer: 59 | Admitting: Family Medicine

## 2015-06-27 VITALS — BP 118/78 | HR 63 | Temp 98.4°F | Resp 18 | Ht 64.5 in | Wt 206.8 lb

## 2015-06-27 DIAGNOSIS — J4520 Mild intermittent asthma, uncomplicated: Secondary | ICD-10-CM

## 2015-06-27 DIAGNOSIS — J209 Acute bronchitis, unspecified: Secondary | ICD-10-CM | POA: Diagnosis not present

## 2015-06-27 MED ORDER — IPRATROPIUM BROMIDE 0.06 % NA SOLN: 2.0000 | NASAL | Status: DC | PRN

## 2015-06-27 MED ORDER — GUAIFENESIN-CODEINE 100-10 MG/5ML PO SOLN: 5.0000 mL | Freq: Every evening | ORAL | Status: DC | PRN

## 2015-06-27 MED ORDER — PREDNISONE 10 MG PO TABS: 30.0000 mg | ORAL_TABLET | Freq: Every day | ORAL | Status: DC

## 2015-06-27 MED ORDER — AZITHROMYCIN 250 MG PO TABS: 250.0000 mg | ORAL_TABLET | Freq: Every day | ORAL | Status: DC

## 2015-06-27 NOTE — Patient Instructions (Addendum)
Thank you for coming in today. Return as needed.  Take prednisone and use the nasal spray.  Take codeine cough medicine as needed.,  Use backup azithromycin antibiotics if not better.  Continue albuterol as needed.  Call or go to the emergency room if you get worse, have trouble breathing, have chest pains, or palpitations.   Acute Bronchitis Bronchitis is inflammation of the airways that extend from the windpipe into the lungs (bronchi). The inflammation often causes mucus to develop. This leads to a cough, which is the most common symptom of bronchitis.  In acute bronchitis, the condition usually develops suddenly and goes away over time, usually in a couple weeks. Smoking, allergies, and asthma can make bronchitis worse. Repeated episodes of bronchitis may cause further lung problems.  CAUSES Acute bronchitis is most often caused by the same virus that causes a cold. The virus can spread from person to person (contagious) through coughing, sneezing, and touching contaminated objects. SIGNS AND SYMPTOMS   Cough.   Fever.   Coughing up mucus.   Body aches.   Chest congestion.   Chills.   Shortness of breath.   Sore throat.  DIAGNOSIS  Acute bronchitis is usually diagnosed through a physical exam. Your health care provider will also ask you questions about your medical history. Tests, such as chest X-rays, are sometimes done to rule out other conditions.  TREATMENT  Acute bronchitis usually goes away in a couple weeks. Oftentimes, no medical treatment is necessary. Medicines are sometimes given for relief of fever or cough. Antibiotic medicines are usually not needed but may be prescribed in certain situations. In some cases, an inhaler may be recommended to help reduce shortness of breath and control the cough. A cool mist vaporizer may also be used to help thin bronchial secretions and make it easier to clear the chest.  HOME CARE INSTRUCTIONS  Get plenty of rest.    Drink enough fluids to keep your urine clear or pale yellow (unless you have a medical condition that requires fluid restriction). Increasing fluids may help thin your respiratory secretions (sputum) and reduce chest congestion, and it will prevent dehydration.   Take medicines only as directed by your health care provider.  If you were prescribed an antibiotic medicine, finish it all even if you start to feel better.  Avoid smoking and secondhand smoke. Exposure to cigarette smoke or irritating chemicals will make bronchitis worse. If you are a smoker, consider using nicotine gum or skin patches to help control withdrawal symptoms. Quitting smoking will help your lungs heal faster.   Reduce the chances of another bout of acute bronchitis by washing your hands frequently, avoiding people with cold symptoms, and trying not to touch your hands to your mouth, nose, or eyes.   Keep all follow-up visits as directed by your health care provider.  SEEK MEDICAL CARE IF: Your symptoms do not improve after 1 week of treatment.  SEEK IMMEDIATE MEDICAL CARE IF:  You develop an increased fever or chills.   You have chest pain.   You have severe shortness of breath.  You have bloody sputum.   You develop dehydration.  You faint or repeatedly feel like you are going to pass out.  You develop repeated vomiting.  You develop a severe headache. MAKE SURE YOU:   Understand these instructions.  Will watch your condition.  Will get help right away if you are not doing well or get worse.   This information is not intended to replace  advice given to you by your health care provider. Make sure you discuss any questions you have with your health care provider.   Document Released: 03/03/2004 Document Revised: 02/14/2014 Document Reviewed: 07/17/2012 Elsevier Interactive Patient Education 2016 [employer].    IF you received an x-ray today, you will receive an invoice from [facility]  Radiology. Please contact [facility] at [phone] with questions or concerns regarding your invoice.   IF you received labwork today, you will receive an invoice from [employer]. Please contact [facility] at [phone] with questions or concerns regarding your invoice.   Our billing staff will not be able to assist you with questions regarding bills from these companies.  You will be contacted with the lab results as soon as they are available. The fastest way to get your results is to activate your My Chart account. Instructions are located on the last page of this paperwork. If you have not heard from [name] regarding the results in 2 weeks, please contact this office.

## 2015-06-27 NOTE — Progress Notes (Signed)
Brenda Russo is a 60 y.o. female who presents to Urgent Care today for cough congestion wheezing and shortness of breath. Symptoms present for the last several days. Symptoms consistent with previous episodes of asthma or bronchitis. She's used her home albuterol nebulizer which helps temporarily. She feels well otherwise. No chest pain vomiting or diarrhea present.   Past Medical History  Diagnosis Date  . Allergic rhinitis   . Asthma   . Hyperlipidemia     borderline  . Overweight(278.02)   . Plantar wart     history  . Anxiety   . HTN (hypertension)   . OSA (obstructive sleep apnea)     mild ,no CPAP   Past Surgical History  Procedure Laterality Date  . Cesarean section    . Tubal ligation     Social History  Substance Use Topics  . Smoking status: Never Smoker   . Smokeless tobacco: Never Used  . Alcohol Use: No   ROS as above Medications: Current Outpatient Prescriptions  Medication Sig Dispense Refill  . albuterol (PROVENTIL) (2.5 MG/3ML) 0.083% nebulizer solution Take 3 mLs (2.5 mg total) by nebulization 3 (three) times daily as needed. 75 mL 6  . albuterol (VENTOLIN HFA) 108 (90 Base) MCG/ACT inhaler Inhale 2 puffs into the lungs every 4 (four) hours as needed for wheezing or shortness of breath. 18 g 5  . amLODipine (NORVASC) 5 MG tablet Take 1 tablet (5 mg total) by mouth daily. 30 tablet 5  . Ascorbic Acid (VITAMIN C) 1000 MG tablet Take 1,000 mg by mouth daily.    . B Complex-C-Folic Acid (B-COMPLEX BALANCED PO) Take by mouth.    . Cholecalciferol (VITAMIN D3) 5000 UNITS CAPS Take 1 capsule by mouth daily.    . Coenzyme Q10 (COQ10) 100 MG CAPS Take 1 capsule by mouth daily.    Marland Kitchen losartan-hydrochlorothiazide (HYZAAR) 100-12.5 MG tablet Take 1 tablet by mouth daily. 30 tablet 5  . magnesium oxide (MAG-OX) 400 MG tablet 2 tablets daily. 400-800 mg daily    . Multiple Minerals-Vitamins (CALCIUM CITRATE PLUS/MAGNESIUM) TABS Take 1 tablet by mouth.    . Multiple  Vitamins-Minerals (COMPLETE WOMENS PO) Take 1 capsule by mouth 2 (two) times daily.      . Omega-3 Fatty Acids (FISH OIL) 1000 MG CAPS Take 1 capsule by mouth.    Marland Kitchen azithromycin (ZITHROMAX) 250 MG tablet Take 1 tablet (250 mg total) by mouth daily. Take first 2 tablets together, then 1 every day until finished. 6 tablet 0  . guaiFENesin-codeine 100-10 MG/5ML syrup Take 5 mLs by mouth at bedtime as needed for cough. 120 mL 0  . ipratropium (ATROVENT) 0.06 % nasal spray Place 2 sprays into both nostrils every 4 (four) hours as needed for rhinitis. 10 mL 6  . predniSONE (DELTASONE) 10 MG tablet Take 3 tablets (30 mg total) by mouth daily with breakfast. 15 tablet 0   No current facility-administered medications for this visit.   Allergies  Allergen Reactions  . Eggs Or Egg-Derived Products     Shortness of breath, wheezing     Exam:  BP 118/78 mmHg  Pulse 63  Temp(Src) 98.4 F (36.9 C) (Oral)  Resp 18  Ht 5' 4.5" (1.638 m)  Wt 206 lb 12.8 oz (93.804 kg)  BMI 34.96 kg/m2  SpO2 96% Gen: Well NAD HEENT: EOMI,  MMMCobblestoning posterior pharynx present Lungs: Normal work of breathing. CTABL Slight prolonged expiratory phase present Heart: RRR no MRG Abd: NABS, Soft. Nondistended, Nontender  Exts: Brisk capillary refill, warm and well perfused.   No results found for this or any previous visit (from the past 24 hour(s)). No results found.  Assessment and Plan: 60 y.o. female with bronchitis/asthma. Treat with prednisone and continued home albuterol, Atrovent nasal spray, and codeine cough syrup. Use azithromycin antibiotics if not better.   Discussed warning signs or symptoms. Please see discharge instructions. Patient expresses understanding.

## 2015-06-29 MED FILL — VENTOLIN HFA 90 MCG INHALER: 108 (90 BAS | 25 days supply | Qty: 18 | Fill #1

## 2015-06-29 MED FILL — LOSARTAN-HCTZ 100-12.5 MG T: 100-12.5 | 30 days supply | Qty: 30 | Fill #1

## 2015-06-29 MED FILL — AMLODIPINE BESYLATE 5 MG TA: 5 | 30 days supply | Qty: 30 | Fill #1

## 2015-06-30 DIAGNOSIS — H25813 Combined forms of age-related cataract, bilateral: Secondary | ICD-10-CM | POA: Diagnosis not present

## 2015-07-01 ENCOUNTER — Ambulatory Visit (INDEPENDENT_AMBULATORY_CARE_PROVIDER_SITE_OTHER): Payer: 59 | Admitting: Internal Medicine

## 2015-07-01 ENCOUNTER — Encounter: Payer: Self-pay | Admitting: Internal Medicine

## 2015-07-01 VITALS — BP 126/80 | HR 59 | Temp 97.8°F | Ht 65.0 in | Wt 208.5 lb

## 2015-07-01 DIAGNOSIS — J4521 Mild intermittent asthma with (acute) exacerbation: Secondary | ICD-10-CM

## 2015-07-01 DIAGNOSIS — Z09 Encounter for follow-up examination after completed treatment for conditions other than malignant neoplasm: Secondary | ICD-10-CM | POA: Diagnosis not present

## 2015-07-01 DIAGNOSIS — H6123 Impacted cerumen, bilateral: Secondary | ICD-10-CM | POA: Diagnosis not present

## 2015-07-01 MED ORDER — PREDNISONE 10 MG PO TABS: ORAL_TABLET | ORAL | Status: DC

## 2015-07-01 MED ORDER — HYDROCODONE-HOMATROPINE 5-1.5 MG/5ML PO SYRP: 5.0000 mL | ORAL_SOLUTION | Freq: Every evening | ORAL | Status: DC | PRN

## 2015-07-01 MED FILL — predniSONE 10 MG TABS: 10 | 8 days supply | Qty: 20 | Fill #0

## 2015-07-01 MED FILL — HYDROCODONE-HOMATROPINE SYR: 5-1.5 | 24 days supply | Qty: 120 | Fill #0

## 2015-07-01 NOTE — Progress Notes (Signed)
Pre visit review using our clinic review tool, if applicable. No additional management support is needed unless otherwise documented below in the visit note. 

## 2015-07-01 NOTE — Patient Instructions (Signed)
Finish the antibiotics as you're doing  Mucinex DM as needed for cough  Continue your inhaler as needed  Second round of prednisone  Stop codeine, start hydrocodone and new cough syrup, if is not helping let me know.  Call if not gradually improving in the next few days

## 2015-07-01 NOTE — Assessment & Plan Note (Signed)
Bronchitis, mild asthma exacerbation: She is improving slowly, continue albuterol as needed, second round of prednisone and finish the antibiotics. D/c codeine, RX  hydrocodone to see if we can control cough better. See instructions. At this point no need to add Symbicort, no chronic symptoms. Cerumen impaction: s/p lavage, only partially clear. Recommend H2 O2 daily for a week, then self flushing, or come back to this office for another lavage RTC 11-2015

## 2015-07-01 NOTE — Progress Notes (Signed)
Subjective:    Patient ID: Brenda Russo, female    DOB: 11-01-1955, 60 y.o.   MRN: [id number]  DOS:  07/01/2015 Type of visit - description : Acute visit Interval history: Symptoms started a few days ago with chest tightness, cough, wheezing, low-grade fever. Went to urgent care, prescribed prednisone, albuterol, codeine and a Z-Pak. Is feeling a little better but he still has cough, worse at night, sometimes she can't sleep d/t cough. + White sputum.    Review of Systems Denies sinus pain, + mild congestion and a small amount of white nasal discharge No nausea, vomiting, diarrhea  Past Medical History  Diagnosis Date  . Allergic rhinitis   . Asthma   . Hyperlipidemia     borderline  . Overweight(278.02)   . Plantar wart     history  . Anxiety   . HTN (hypertension)   . OSA (obstructive sleep apnea)     mild ,no CPAP    Past Surgical History  Procedure Laterality Date  . Cesarean section    . Tubal ligation      Social History   Social History  . Marital Status: Married    Spouse Name: N/A  . Number of Children: 7  . Years of Education: N/A   Occupational History  . 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 Smoking status: Never Smoker   . Smokeless tobacco: Never Used  . Alcohol Use: No  . Drug Use: No  . Sexual Activity: Not Currently   Other Topics Concern  . Not on file   Social History Narrative   3 biological children   1 adopted child   3 step-children   Household-- pt , husband and a daughter               Medication List       This list is accurate as of: 07/01/15 11:01 AM.  Always use your most recent med list.               albuterol (2.5 MG/3ML) 0.083% nebulizer solution  Commonly known as:  PROVENTIL  Take 3 mLs (2.5 mg total) by nebulization 3 (three) times daily as needed.     albuterol 108 (90 Base) MCG/ACT inhaler  Commonly known as:  VENTOLIN HFA  Inhale 2 puffs into the lungs  every 4 (four) hours as needed for wheezing or shortness of breath.     amLODipine 5 MG tablet  Commonly known as:  NORVASC  Take 1 tablet (5 mg total) by mouth daily.     azithromycin 250 MG tablet  Commonly known as:  ZITHROMAX  Take 1 tablet (250 mg total) by mouth daily. Take first 2 tablets together, then 1 every day until finished.     B-COMPLEX BALANCED PO  Take by mouth.     CALCIUM CITRATE PLUS/MAGNESIUM Tabs  Take 1 tablet by mouth.     COMPLETE WOMENS PO  Take 1 capsule by mouth 2 (two) times daily.     CoQ10 100 MG Caps  Take 1 capsule by mouth daily.     Fish Oil 1000 MG Caps  Take 1 capsule by mouth.     guaiFENesin-codeine 100-10 MG/5ML syrup  Take 5 mLs by mouth at bedtime as needed for cough.     ipratropium 0.06 % nasal spray  Commonly known as:  ATROVENT  Place 2 sprays into both nostrils every 4 (four) hours as  needed for rhinitis.     losartan-hydrochlorothiazide 100-12.5 MG tablet  Commonly known as:  HYZAAR  Take 1 tablet by mouth daily.     magnesium oxide 400 MG tablet  Commonly known as:  MAG-OX  2 tablets daily. 400-800 mg daily     predniSONE 10 MG tablet  Commonly known as:  DELTASONE  Take 3 tablets (30 mg total) by mouth daily with breakfast.     vitamin C 1000 MG tablet  Take 1,000 mg by mouth daily.     Vitamin D3 5000 units Caps  Take 1 capsule by mouth daily.           Objective:   Physical Exam BP 126/80 mmHg  Pulse 59  Temp(Src) 97.8 F (36.6 C) (Oral)  Ht 5\' 5"  (1.651 m)  Wt 208 lb 8 oz (94.575 kg)  BMI 34.70 kg/m2  SpO2 96% General:   Well developed, well nourished . NAD.  HEENT:  Normocephalic . Face symmetric, atraumatic. Nose: Nose congested, sinuses no TTP. Throat: No red, symmetric Ears: Impacted bilaterally Lungs:  CTA B Normal respiratory effort, no intercostal retractions, no accessory muscle use. Heart: RRR,  no murmur.  No pretibial edema bilaterally  Skin: Not pale. Not jaundice Neurologic:    alert & oriented X3.  Speech normal, gait appropriate for age and unassisted Psych--  Cognition and judgment appear intact.  Cooperative with normal attention span and concentration.  Behavior appropriate. No anxious or depressed appearing.      Assessment & Plan:   Assessment Prediabetes A1c 6.0 (05/2014) HTN Hyperlipidemia Anxiety Obesity Asthma  OSA, no CPAP BTL  Plan: Bronchitis, mild asthma exacerbation: She is improving slowly, continue albuterol as needed, second round of prednisone and finish the antibiotics. D/c codeine, RX  hydrocodone to see if we can control cough better. See instructions. At this point no need to add Symbicort, no chronic symptoms. Cerumen impaction: s/p lavage, only partially clear. Recommend H2 O2 daily for a week, then self flushing, or come back to this office for another lavage RTC 11-2015

## 2015-07-21 ENCOUNTER — Inpatient Hospital Stay (HOSPITAL_BASED_OUTPATIENT_CLINIC_OR_DEPARTMENT_OTHER): Admission: RE | Admit: 2015-07-21 | Payer: Self-pay | Source: Ambulatory Visit

## 2015-07-28 ENCOUNTER — Ambulatory Visit (HOSPITAL_BASED_OUTPATIENT_CLINIC_OR_DEPARTMENT_OTHER)
Admission: RE | Admit: 2015-07-28 | Discharge: 2015-07-28 | Disposition: A | Discharge status: Home / Self Care | Payer: 59 | Source: Ambulatory Visit | Attending: Internal Medicine | Admitting: Internal Medicine

## 2015-07-28 DIAGNOSIS — Z780 Asymptomatic menopausal state: Secondary | ICD-10-CM | POA: Diagnosis not present

## 2015-07-28 DIAGNOSIS — E559 Vitamin D deficiency, unspecified: Secondary | ICD-10-CM | POA: Insufficient documentation

## 2015-07-28 DIAGNOSIS — Z13820 Encounter for screening for osteoporosis: Secondary | ICD-10-CM | POA: Diagnosis not present

## 2015-07-31 MED FILL — AMLODIPINE BESYLATE 5 MG TA: 5 | 90 days supply | Qty: 90 | Fill #2

## 2015-07-31 MED FILL — LOSARTAN-HCTZ 100-12.5 MG T: 100-12.5 | 90 days supply | Qty: 90 | Fill #2

## 2015-07-31 MED FILL — VENTOLIN HFA 90 MCG INHALER: 108 (90 BAS | 50 days supply | Qty: 54 | Fill #2

## 2015-08-12 DIAGNOSIS — Z6835 Body mass index (BMI) 35.0-35.9, adult: Secondary | ICD-10-CM | POA: Diagnosis not present

## 2015-08-12 DIAGNOSIS — Z1231 Encounter for screening mammogram for malignant neoplasm of breast: Secondary | ICD-10-CM | POA: Diagnosis not present

## 2015-08-12 DIAGNOSIS — Z01419 Encounter for gynecological examination (general) (routine) without abnormal findings: Secondary | ICD-10-CM | POA: Diagnosis not present

## 2015-08-12 DIAGNOSIS — Z1151 Encounter for screening for human papillomavirus (HPV): Secondary | ICD-10-CM | POA: Diagnosis not present

## 2015-08-12 LAB — HM PAP SMEAR

## 2015-08-18 DIAGNOSIS — R928 Other abnormal and inconclusive findings on diagnostic imaging of breast: Secondary | ICD-10-CM | POA: Diagnosis not present

## 2015-08-18 DIAGNOSIS — R922 Inconclusive mammogram: Secondary | ICD-10-CM | POA: Diagnosis not present

## 2015-08-18 DIAGNOSIS — N63 Unspecified lump in breast: Secondary | ICD-10-CM | POA: Diagnosis not present

## 2015-08-18 LAB — HM MAMMOGRAPHY

## 2015-08-31 ENCOUNTER — Telehealth: Payer: Self-pay | Admitting: Gastroenterology

## 2015-08-31 DIAGNOSIS — R1032 Left lower quadrant pain: Secondary | ICD-10-CM | POA: Diagnosis not present

## 2015-08-31 NOTE — Telephone Encounter (Signed)
The pt has been seen by PCP and GYN and was told to follow up with GI, she has off/on abd pain that is sharp.  She has a follow up on 09/22/15 and will keep that with [facility] wellness.  She will call back if symptoms worsen and also has been added to the wait list

## 2015-09-02 DIAGNOSIS — N63 Unspecified lump in breast: Secondary | ICD-10-CM | POA: Diagnosis not present

## 2015-09-02 DIAGNOSIS — N6011 Diffuse cystic mastopathy of right breast: Secondary | ICD-10-CM | POA: Diagnosis not present

## 2015-09-02 HISTORY — PX: BREAST BIOPSY: SHX20

## 2015-09-08 ENCOUNTER — Ambulatory Visit: Payer: Self-pay

## 2015-09-09 ENCOUNTER — Encounter: Payer: Self-pay | Admitting: Internal Medicine

## 2015-09-09 ENCOUNTER — Telehealth: Payer: Self-pay | Admitting: Internal Medicine

## 2015-09-09 NOTE — Telephone Encounter (Signed)
Called [facility], Pt had benign US guided biopsy on R breast on 09/02/2015. Results benign. Recommended to follow-up in 6 months. Results sent for scanning.

## 2015-09-09 NOTE — Telephone Encounter (Signed)
I got the results from a mammogram and a subsequent breast ultrasound from 08/18/2015, they recommended a right breast biopsy. Please ask the patient if I can help her schedule a biopsy (it may have been done already).

## 2015-09-22 ENCOUNTER — Encounter: Payer: Self-pay | Admitting: Internal Medicine

## 2015-09-22 ENCOUNTER — Other Ambulatory Visit: Payer: Self-pay

## 2015-09-22 DIAGNOSIS — R7302 Impaired glucose tolerance (oral): Secondary | ICD-10-CM

## 2015-09-22 LAB — POCT GLYCOSYLATED HEMOGLOBIN (HGB A1C): Hemoglobin A1C: 5.9

## 2015-09-22 NOTE — Patient Outreach (Signed)
[facility]) Care Management   09/22/2015  Brenda Russo 1955/12/14 [id number]  Brenda Russo is an 60 y.o. female.   Member seen for follow up office visit for Link to Wellness program for self management of hypertension and impaired fasting glucose  Subjective: Member states that she enrolled in the Diabetes Prevention Program.  States she is trying to eat healthier and exercise more.  States that her B/Ps have been improved.    Objective:   Review of Systems  Gastrointestinal: Positive for abdominal pain.   POC HemoglobinA1C- 5.9% Physical Exam Today's Vitals   09/22/15 1108 09/22/15 1119  BP: 140/90   Pulse: (!) 56   Resp: 16   SpO2: 97%   Weight: 209 lb 6.4 oz (95 kg)   Height: 1.651 m (5\' 5" )   PainSc: 0-No pain 0-No pain   Encounter Medications:   Outpatient Encounter Prescriptions as of 09/22/2015  Medication Sig  . albuterol (PROVENTIL) (2.5 MG/3ML) 0.083% nebulizer solution Take 3 mLs (2.5 mg total) by nebulization 3 (three) times daily as needed.  Marland Kitchen albuterol (VENTOLIN HFA) 108 (90 Base) MCG/ACT inhaler Inhale 2 puffs into the lungs every 4 (four) hours as needed for wheezing or shortness of breath.  Marland Kitchen amLODipine (NORVASC) 5 MG tablet Take 1 tablet (5 mg total) by mouth daily.  . Ascorbic Acid (VITAMIN C) 1000 MG tablet Take 1,000 mg by mouth daily.  . B Complex-C-Folic Acid (B-COMPLEX BALANCED PO) Take by mouth.  . Cholecalciferol (VITAMIN D3) 5000 UNITS CAPS Take 1 capsule by mouth daily.  . Coenzyme Q10 (COQ10) 100 MG CAPS Take 1 capsule by mouth daily.  Marland Kitchen losartan-hydrochlorothiazide (HYZAAR) 100-12.5 MG tablet Take 1 tablet by mouth daily.  . magnesium oxide (MAG-OX) 400 MG tablet 2 tablets daily. 400-800 mg daily  . Multiple Minerals-Vitamins (CALCIUM CITRATE PLUS/MAGNESIUM) TABS Take 1 tablet by mouth.  . Multiple Vitamins-Minerals (COMPLETE WOMENS PO) Take 1 capsule by mouth 2 (two) times daily.    . Omega-3 Fatty Acids (FISH OIL)  1000 MG CAPS Take 1 capsule by mouth.  Marland Kitchen azithromycin (ZITHROMAX) 250 MG tablet Take 1 tablet (250 mg total) by mouth daily. Take first 2 tablets together, then 1 every day until finished. (Patient not taking: Reported on 09/22/2015)  . HYDROcodone-homatropine (HYCODAN) 5-1.5 MG/5ML syrup Take 5 mLs by mouth at bedtime as needed for cough. (Patient not taking: Reported on 09/22/2015)  . ipratropium (ATROVENT) 0.06 % nasal spray Place 2 sprays into both nostrils every 4 (four) hours as needed for rhinitis. (Patient not taking: Reported on 09/22/2015)  . predniSONE (DELTASONE) 10 MG tablet 4 tablets x 2 days, 3 tabs x 2 days, 2 tabs x 2 days, 1 tab x 2 days (Patient not taking: Reported on 09/22/2015)   No facility-administered encounter medications on file as of 09/22/2015.     Functional Status:   In your present state of health, do you have any difficulty performing the following activities: 06/10/2015 05/20/2015  Hearing? N N  Vision? N N  Difficulty concentrating or making decisions? N N  Walking or climbing stairs? N N  Dressing or bathing? N N  Doing errands, shopping? N N  Some recent data might be hidden    Fall/Depression Screening:    PHQ 2/9 Scores 09/22/2015 06/27/2015 06/10/2015 05/20/2015 02/10/2015 12/10/2014 08/20/2014  PHQ - 2 Score 0 0 0 0 0 0 0    Assessment:   Member seen for follow up office visit for Link to [employer]  program for self management of Hypertension and prediabetes. Member is now is back on B/P medications and she is meeting  goal of B/P readings  of  140/90 or less.Member had hemoglobin A1C increased to 6.2% with last MD visit.POC Hemoglobin A1C decreased to 5.9% today Reports getting average of 12,000 steps a day and following a low sodium diet.  Member is now going to the Diabetes Prevention Program.  Plan to close case at next visit if remains stable and achieving goals. Plan:   Plan to eat the healthy plate method and the Dietary Approaches to stop hypertension  diet. Plan to eat 30-45 GM (2-3 servings) of carbohydrates a meal and 15GM for snacks.  Plan eat protein with snacks. Plan to walk 30 minutes 5 days a week Plan to check B/P 1-2 times a week at different times and write on log.  Plan to bring log to next visit Plan to complete EMMI programs by 12/09/15 Plan to continue the Diabetes Prevention Program Plan to return to Link to Wellness on 12/22/15 at [place] Problem One   Flowsheet Row Most Recent Value  Care Plan Problem One  Elevated blood pressures  Role Documenting the Problem One  Care Management [place]er for Problem One  Active  [facility] Long Term Goal (31-90 days)  Member will lower blood pressure below 140/90 80% of readings as evidenced by log  [facility] Long Term Goal Start Date  09/22/15 [continued improved readings since starting medications]  Interventions for Problem One Long Term Goal   Reinforced on goals for B/P and to check her B/P daily at different times, Reinforced to bring log to next visit,  Reinforced to follow the dietary approaches to stopping hypertension, Reinforced that even losing 5- 10% weight can effect her B/P and blood sugars,Reinforced on importance of regular exercise to help control her B/P and lose weight     [facility] CM Care Plan Problem Two   Flowsheet Row Most Recent Value  Care Plan Problem Two  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  Role Documenting the Problem Two  Care Management Coordinator  Care Plan for Problem Two  Active  Interventions for Problem Two Long Term Goal   Reinforced on what foods have CHO and CHO counting, Given booklet on CHO counting, Encouraged to eat more protein and to have protein with her snacks,  Instructed on ways to decrease her hemoglobin A1C through weight loss and exercise, Given information on how to sign up for the Diabetes Prevention Program,   [facility] Long Term Goal (31-90) days  Member will decrease hemoglobin A1C to 6 or  below in the next 90 days  [facility] Long Term Goal Start Date  09/22/15    Peter Garter RN, [facility] Care Management Coordinator-Link to [place] Management [phone]

## 2015-09-23 NOTE — Patient Instructions (Signed)
1. Plan to eat the healthy plate method and the Dietary Approaches to stop hypertension diet. 2. Plan to eat 30-45 GM (2-3 servings) of carbohydrates a meal and 15GM for snacks.  Plan eat protein with snacks. 3. Plan to walk 30 minutes 5 days a week 4. Plan to check B/P 1-2 times a week at different times and write on log.  Plan to bring log to next visit 5. Plan to complete EMMI programs by 12/09/15 6. Plan to continue the Diabetes Prevention Program 7. Plan to return to Link to Wellness on 12/22/15 at 11AM

## 2015-10-27 ENCOUNTER — Telehealth: Payer: Self-pay | Admitting: Internal Medicine

## 2015-10-27 NOTE — Telephone Encounter (Signed)
Caller name: Relationship to patient: Can be reached: [phone]   Reason for call: Patient request call back to discuss GI appointment vs appointment with Dr. Larose Kells

## 2015-10-28 NOTE — Telephone Encounter (Signed)
Spoke w/ Pt, she continues to have LLQ abdominal pain (discussed w/ PCP at [facility] on 05/20/2015) intermittently,stated she has been checked for hernia's and recommended she discuss pain w/ GYN. She saw her GYN on 08/12/2015 and had a vaginal ultrasound completed 08/31/2015 which was undetermined (they were not able to palpate area due to pain). They recommend she see GI and she has an appt w/ Dr. Ardis Hughs on 11/09/2015. She called here because she had been reading about pain on internet and wonders if she has diverticulitis and wanted to know if this was something PCP could treat (6 month F/U scheduled 11/18/2015) . Informed her I would recommend keeping appt w/ GI but that I would send information to PCP for further advice if any. Pt verbalized understanding and stated she would keep appt w/ GI as scheduled. Also informed her that if pain becomes severe, she has severe N/V, diarrhea, blood in stools, to go to ED.

## 2015-10-28 NOTE — Telephone Encounter (Signed)
If she has persistent pain, nausea, fever she may have diverticulitis, if that were the case she needs to be seen. If pain iss mild, intermittent,  without fever then recommend to wait to see GI.

## 2015-11-05 MED FILL — LOSARTAN-HCTZ 100-12.5 MG T: 100-12.5 | 30 days supply | Qty: 30 | Fill #3

## 2015-11-05 MED FILL — AMLODIPINE BESYLATE 5 MG TA: 5 | 30 days supply | Qty: 30 | Fill #3

## 2015-11-05 MED FILL — VENTOLIN HFA 90 MCG INHALER: 108 (90 BAS | 16 days supply | Qty: 18 | Fill #3

## 2015-11-09 ENCOUNTER — Encounter: Payer: Self-pay | Admitting: Gastroenterology

## 2015-11-09 ENCOUNTER — Ambulatory Visit (INDEPENDENT_AMBULATORY_CARE_PROVIDER_SITE_OTHER): Payer: 59 | Admitting: Gastroenterology

## 2015-11-09 VITALS — BP 142/100 | HR 64 | Ht 64.57 in | Wt 211.2 lb

## 2015-11-09 DIAGNOSIS — R1032 Left lower quadrant pain: Secondary | ICD-10-CM | POA: Diagnosis not present

## 2015-11-09 NOTE — Patient Instructions (Addendum)
You will be set up for a CT scan of abdomen and pelvis with IV and oral contrast for LLQ pain, ? Hernia.   You have been scheduled for a CT scan of the abdomen and pelvis at [facility] ([id number]---this is in the same building as Press [facility]).   You are scheduled on 11/17/15 at 10 am. You should arrive 15 minutes prior to your appointment time for registration. Please follow the written instructions below on the day of your exam:  WARNING: IF YOU ARE ALLERGIC TO IODINE/X-RAY DYE, PLEASE NOTIFY RADIOLOGY IMMEDIATELY AT [phone]! YOU WILL BE GIVEN A 13 HOUR PREMEDICATION PREP.  1) Do not eat or drink anything after 6 am (4 hours prior to your test) 2) You have been given 2 bottles of oral contrast to drink. The solution may taste  better if refrigerated, but do NOT add ice or any other liquid to this solution. Shake well before drinking.    Drink 1 bottle of contrast @ 8 am (2 hours prior to your exam)  Drink 1 bottle of contrast @ 9 am (1 hour prior to your exam)  You may take any medications as prescribed with a small amount of water except for the following: Metformin, Glucophage, Glucovance, Avandamet, Riomet, Fortamet, Actoplus Met, Janumet, Glumetza or Metaglip. The above medications must be held the day of the exam AND 48 hours after the exam.  The purpose of you drinking the oral contrast is to aid in the visualization of your intestinal tract. The contrast solution may cause some diarrhea. Before your exam is started, you will be given a small amount of fluid to drink. Depending on your individual set of symptoms, you may also receive an intravenous injection of x-ray contrast/dye. Plan on being at [facility] for 30 minutes or longer, depending on the type of exam you are having performed.  This test typically takes 30-45 minutes to complete.  If you have any questions regarding your exam or if you need to reschedule, you may call the CT department  at [phone] between the hours of 8:00 am and 5:00 pm, Monday-Friday.  ________________________________________________________________________

## 2015-11-09 NOTE — Progress Notes (Signed)
Review of pertinent gastrointestinal problems: 1. Routine risk for colon cancer, colonoscopy Dr. Ardis Hughs July 2010 for minor rectal bleeding. This was normal. She was recommended to have repeat colon cancer screening with colonoscopy at 10 year interval.    HPI: This is a  very pleasant 60 year old woman    who was referred to me by Colon Branch, MD  to evaluate  left lower quadrant pain .    Chief complaint is left lower quadrant pain  She has pain in left low flank.  Feels contantnly sore and inflamed.  Holding the are seems to help.  This has been worse since march.    It has been more constant since March.  Previously was just contstant.  Feels like a ache, soreness.    No signifcant constipation or diarrhea.  Having a BM doesn't really affect it.  Eating does not really affect it.  PCP evaluation and then was sent to her gynecologist.  Her gynecologist did a stool test last year for "routine testing" and it was negative.  She has minor GERD, usually after big meal late at night  She wonders if eating more nuts lately has caused the pains.  Weight overall stable.  CT scan June 2014 for "worsening left lower quadrant pain" ordered by her primary care physician was essentially normal except for "question tiny fat-containing inguinal hernias bilaterally"    Review of systems: Pertinent positive and negative review of systems were noted in the above HPI section. Complete review of systems was performed and was otherwise normal.   Past Medical History:  Diagnosis Date  . Allergic rhinitis   . Anxiety   . Asthma   . HTN (hypertension)   . Hyperlipidemia    borderline  . OSA (obstructive sleep apnea)    mild ,no CPAP  . Overweight(278.02)   . Plantar wart    history    Past Surgical History:  Procedure Laterality Date  . BREAST BIOPSY Right 09/02/2015  . CESAREAN SECTION    . TUBAL LIGATION      Current Outpatient Prescriptions  Medication Sig Dispense Refill  .  albuterol (PROVENTIL) (2.5 MG/3ML) 0.083% nebulizer solution Take 3 mLs (2.5 mg total) by nebulization 3 (three) times daily as needed. 75 mL 6  . albuterol (VENTOLIN HFA) 108 (90 Base) MCG/ACT inhaler Inhale 2 puffs into the lungs every 4 (four) hours as needed for wheezing or shortness of breath. 18 g 5  . amLODipine (NORVASC) 5 MG tablet Take 1 tablet (5 mg total) by mouth daily. 30 tablet 5  . Ascorbic Acid (VITAMIN C) 1000 MG tablet Take 1,000 mg by mouth daily.    . B Complex-C-Folic Acid (B-COMPLEX BALANCED PO) Take by mouth.    . Cholecalciferol (VITAMIN D3) 5000 UNITS CAPS Take 1 capsule by mouth daily.    . Coenzyme Q10 (COQ10) 100 MG CAPS Take 1 capsule by mouth daily.    Marland Kitchen losartan-hydrochlorothiazide (HYZAAR) 100-12.5 MG tablet Take 1 tablet by mouth daily. (Patient taking differently: Take 1 tablet by mouth as needed. ) 30 tablet 5  . magnesium oxide (MAG-OX) 400 MG tablet 2 tablets daily. 400-800 mg daily    . Multiple Minerals-Vitamins (CALCIUM CITRATE PLUS/MAGNESIUM) TABS Take 1 tablet by mouth.    . Multiple Vitamins-Minerals (COMPLETE WOMENS PO) Take 1 capsule by mouth 2 (two) times daily.      . Omega-3 Fatty Acids (FISH OIL) 1000 MG CAPS Take 1 capsule by mouth.     No current  facility-administered medications for this visit.     Allergies as of 11/09/2015 - Review Complete 11/09/2015  Allergen Reaction Noted  . Eggs or egg-derived products  08/09/2010    Family History  Problem Relation Age of Onset  . Heart disease Mother     pacemaker   . Hypertension Mother   . Hyperlipidemia Mother   . Asthma Father   . Pancreatic cancer Brother   . Breast cancer Maternal Aunt   . Colon cancer Neg Hx     Social History   Social History  . Marital status: Married    Spouse name: N/A  . Number of children: 3  . Years of education: N/A   Occupational History  . 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 Smoking status: Never Smoker  .  Smokeless tobacco: Never Used  . Alcohol use No  . Drug use: No  . Sexual activity: Not Currently   Other Topics Concern  . Not on file   Social History Narrative   3 biological children   1 adopted child   3 step-children   Household-- pt , husband and a daughter            Physical Exam: Ht 5' 4.57" (1.64 m) Comment: height measured without shoes  Wt 211 lb 4 oz (95.8 kg)   BMI 35.63 kg/m  Constitutional: generally well-appearing Psychiatric: alert and oriented x3 Eyes: extraocular movements intact Mouth: oral pharynx moist, no lesions Neck: supple no lymphadenopathy Cardiovascular: heart regular rate and rhythm Lungs: clear to auscultation bilaterally Abdomen: soft, mild left lower quadrant tenderness. She was difficult to examine, she kept her left hand holding her left abdomen and was reluctant to let me palpate, she guided my hand at one point. I do not detect obvious hernia. nondistended, no obvious ascites, no peritoneal signs, normal bowel sounds Extremities: no lower extremity edema bilaterally Skin: no lesions on visible extremities   Assessment and plan: 60 y.o. female with  chronic left lower quadrant discomfort  I am not sure if this is GI related. Moving her bowels and eating have no effect on this left lower quadrant discomfort. I think it has been going on for several years since she had a CAT scan for left lower quadrant pain in 2014. This showed possible small inguinal hernias bilaterally. She was voluntarily guarding today, not letting me palpate very much in her left lower quadrant. She had no peritoneal signs. Possibly this is a large hernia although I did not detect 1 I had a very limited exam today. I recommended we repeat CT scan with IV and oral contrast for the left lower quadrant pain. I see no reason for blood tests or imaging studies other than that.   Owens Loffler, MD [facility] 11/09/2015, 8:39 AM  Cc: Colon Branch, MD

## 2015-11-17 ENCOUNTER — Ambulatory Visit (INDEPENDENT_AMBULATORY_CARE_PROVIDER_SITE_OTHER)
Admission: RE | Admit: 2015-11-17 | Discharge: 2015-11-17 | Disposition: A | Discharge status: Home / Self Care | Payer: 59 | Source: Ambulatory Visit | Attending: Gastroenterology | Admitting: Gastroenterology

## 2015-11-17 DIAGNOSIS — R1032 Left lower quadrant pain: Secondary | ICD-10-CM

## 2015-11-17 DIAGNOSIS — K449 Diaphragmatic hernia without obstruction or gangrene: Secondary | ICD-10-CM | POA: Diagnosis not present

## 2015-11-17 MED ORDER — IOPAMIDOL (ISOVUE-300) INJECTION 61%
100.0000 mL | Freq: Once | INTRAVENOUS | Status: AC | PRN
  Administered 2015-11-17: 100 mL via INTRAVENOUS

## 2015-11-18 ENCOUNTER — Ambulatory Visit (INDEPENDENT_AMBULATORY_CARE_PROVIDER_SITE_OTHER): Payer: 59 | Admitting: Internal Medicine

## 2015-11-18 ENCOUNTER — Encounter: Payer: Self-pay | Admitting: Internal Medicine

## 2015-11-18 VITALS — BP 120/78 | HR 68 | Temp 98.1°F | Resp 16 | Ht 65.0 in | Wt 212.0 lb

## 2015-11-18 DIAGNOSIS — R739 Hyperglycemia, unspecified: Secondary | ICD-10-CM | POA: Diagnosis not present

## 2015-11-18 DIAGNOSIS — J4520 Mild intermittent asthma, uncomplicated: Secondary | ICD-10-CM

## 2015-11-18 DIAGNOSIS — I10 Essential (primary) hypertension: Secondary | ICD-10-CM | POA: Diagnosis not present

## 2015-11-18 MED ORDER — BECLOMETHASONE DIPROPIONATE 80 MCG/ACT IN AERS: 2.0000 | INHALATION_SPRAY | Freq: Every day | RESPIRATORY_TRACT | 12 refills | Status: DC

## 2015-11-18 MED FILL — QVAR 80 MCG ORAL INHALER: 80 | 30 days supply | Qty: 9 | Fill #0

## 2015-11-18 NOTE — Assessment & Plan Note (Signed)
Prediabetes: We discuss diet extensively including portion control HTN: Recommend to take both medications Hyzaar and amlodipine daily, risk of uncontrolled BP including CAD and strokes discussed. Asthma: Currently on albuterol only, uses it one time a day on average. Recommend to start Qvar, use albuterol as needed only. See instructions. She is in agreement. RTC April 2018 CPX

## 2015-11-18 NOTE — Progress Notes (Signed)
Pre visit review using our clinic review tool, if applicable. No additional management support is needed unless otherwise documented below in the visit note. 

## 2015-11-18 NOTE — Patient Instructions (Signed)
Start Qvar 2 puffs every day regardless of symptoms  Use albuterol inhaler or nebulizer only if you are coughing, wheezing; this is your rescue treatment.  If you are using your rescue treatments more than 4 times a week please let me know  Next visit by April 2018, CPX. Please make an appointment.  Call your pharmacy when you need a refill.

## 2015-11-18 NOTE — Progress Notes (Signed)
Subjective:    Patient ID: Brenda Russo, female    DOB: 1955/10/29, 60 y.o.   MRN: [id number]  DOS:  11/18/2015 Type of visit - description : rov Interval history: Asthma: On albuterol only, using either inhaler or nebulizer on average one time a day. Had a flu shot few days ago, since then is having some sinus and chest congestion. Overall feels better today HTN: Occasionally forgets to take her medication   Review of Systems  denies fever chills No sputum production   Past Medical History:  Diagnosis Date  . Allergic rhinitis   . Anxiety   . Asthma   . HTN (hypertension)   . Hyperlipidemia    borderline  . OSA (obstructive sleep apnea)    mild ,no CPAP  . Overweight(278.02)   . Plantar wart    history    Past Surgical History:  Procedure Laterality Date  . BREAST BIOPSY Right 09/02/2015  . CESAREAN SECTION    . TUBAL LIGATION      Social History   Social History  . Marital status: Married    Spouse name: N/A  . Number of children: 3  . Years of education: N/A   Occupational History  . 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 Smoking status: Never Smoker  . Smokeless tobacco: Never Used  . Alcohol use No  . Drug use: No  . Sexual activity: Not Currently   Other Topics Concern  . Not on file   Social History Narrative   3 biological children   1 adopted child   3 step-children   Household-- pt , husband and a daughter               Medication List       Accurate as of 11/18/15  6:58 PM. Always use your most recent med list.          albuterol (2.5 MG/3ML) 0.083% nebulizer solution Commonly known as:  PROVENTIL Take 3 mLs (2.5 mg total) by nebulization 3 (three) times daily as needed.   albuterol 108 (90 Base) MCG/ACT inhaler Commonly known as:  VENTOLIN HFA Inhale 2 puffs into the lungs every 4 (four) hours as needed for wheezing or shortness of breath.   amLODipine 5 MG tablet Commonly known  as:  NORVASC Take 1 tablet (5 mg total) by mouth daily.   B-COMPLEX BALANCED PO Take by mouth.   beclomethasone 80 MCG/ACT inhaler Commonly known as:  QVAR Inhale 2 puffs into the lungs daily.   CALCIUM CITRATE PLUS/MAGNESIUM Tabs Take 1 tablet by mouth.   COMPLETE WOMENS PO Take 1 capsule by mouth 2 (two) times daily.   CoQ10 100 MG Caps Take 1 capsule by mouth daily.   Fish Oil 1000 MG Caps Take 1 capsule by mouth.   losartan-hydrochlorothiazide 100-12.5 MG tablet Commonly known as:  HYZAAR Take 1 tablet by mouth daily.   magnesium oxide 400 MG tablet Commonly known as:  MAG-OX 2 tablets daily. 400-800 mg daily   vitamin C 1000 MG tablet Take 1,000 mg by mouth daily.   Vitamin D3 5000 units Caps Take 1 capsule by mouth daily.          Objective:   Physical Exam BP 120/78 (BP Location: Left Arm, Patient Position: Sitting, Cuff Size: Normal)   Pulse 68   Temp 98.1 F (36.7 C) (Oral)   Resp 16   Ht 5\' 5"  (1.651 m)  Wt 212 lb (96.2 kg)   SpO2 96%   BMI 35.28 kg/m  General:   Well developed, well nourished . NAD.  HEENT:  Normocephalic . Face symmetric, atraumatic Lungs:  CTA B Normal respiratory effort, no intercostal retractions, no accessory muscle use. Heart: RRR,  no murmur.  No pretibial edema bilaterally  Skin: Not pale. Not jaundice Neurologic:  alert & oriented X3.  Speech normal, gait appropriate for age and unassisted Psych--  Cognition and judgment appear intact.  Cooperative with normal attention span and concentration.  Behavior appropriate. No anxious or depressed appearing.      Assessment & Plan:   Assessment Prediabetes A1c 6.0 (05/2014) HTN Hyperlipidemia Anxiety Obesity Asthma - h/o intubation x2 in the 90s  OSA, no CPAP BTL  PLAN: Prediabetes: We discuss diet extensively including portion control HTN: Recommend to take both medications Hyzaar and amlodipine daily, risk of uncontrolled BP including CAD and strokes  discussed. Asthma: Currently on albuterol only, uses it one time a day on average. Recommend to start Qvar, use albuterol as needed only. See instructions. She is in agreement. RTC April 2018 CPX

## 2015-11-21 ENCOUNTER — Ambulatory Visit (INDEPENDENT_AMBULATORY_CARE_PROVIDER_SITE_OTHER): Payer: 59 | Admitting: Family Medicine

## 2015-11-21 VITALS — BP 126/82 | HR 58 | Temp 98.1°F | Resp 18 | Ht 65.0 in | Wt 210.0 lb

## 2015-11-21 DIAGNOSIS — J4541 Moderate persistent asthma with (acute) exacerbation: Secondary | ICD-10-CM | POA: Diagnosis not present

## 2015-11-21 DIAGNOSIS — J069 Acute upper respiratory infection, unspecified: Secondary | ICD-10-CM

## 2015-11-21 DIAGNOSIS — R7302 Impaired glucose tolerance (oral): Secondary | ICD-10-CM | POA: Diagnosis not present

## 2015-11-21 MED ORDER — PREDNISONE 20 MG PO TABS: ORAL_TABLET | ORAL | 0 refills | Status: DC

## 2015-11-21 MED ORDER — AMOXICILLIN-POT CLAVULANATE 875-125 MG PO TABS
1.0000 | ORAL_TABLET | Freq: Two times a day (BID) | ORAL | 0 refills | Status: DC
Start: 2015-11-21 — End: 2015-12-24

## 2015-11-21 NOTE — Progress Notes (Signed)
Subjective:    Patient ID: Brenda Russo, female    DOB: 1955/07/21, 60 y.o.   MRN: [id number]  11/21/2015  Cough and CONGESTION   HPI This 60 y.o. female presents for evaluation of cough and congestion.  When up, minimal symptoms other than cough. Last night coughed excessive with white yellowish phlegm.  Used nebulizer today due to symptoms.  +feverish; +sweats; +chills. +achy.  Onset one week ago.  HA one day but pressure was up.  Intermittent sore throat.  No ear pain.  +rhinorrhea; +nasal congestion clear.  Coughing mostly at night; really worsened; took cough medication/suppresant/Mucinex with some improvement.  No wheezing. Some SOB with movement.  No n/v/d.  QVAR just prescribed this week by Dr. Larose Kells.  Suffers with allergic conjunctivitis; has allergy eye drops.  No sneezing.  Using Albuterol nebulizer this morning for the first time at 9:00am with some improvement.  Using Albuterol inhaler 3-4 times each day.  Shortness of breath has been mild. Works at [employer].  S/p flu vaccine.   Review of Systems  Constitutional: Positive for chills, diaphoresis and fatigue. Negative for fever.  HENT: Positive for congestion, postnasal drip, rhinorrhea and sore throat. Negative for ear pain, sinus pressure, sneezing and trouble swallowing.   Eyes: Positive for itching.  Respiratory: Positive for cough and shortness of breath. Negative for wheezing.   Cardiovascular: Negative for chest pain, palpitations and leg swelling.  Gastrointestinal: Negative for abdominal pain, constipation, diarrhea, nausea and vomiting.  Neurological: Negative for headaches.    Past Medical History:  Diagnosis Date  . Allergic rhinitis   . Anxiety   . Asthma   . HTN (hypertension)   . Hyperlipidemia    borderline  . OSA (obstructive sleep apnea)    mild ,no CPAP  . Overweight(278.02)   . Plantar wart    history   Past Surgical History:  Procedure Laterality Date  . BREAST BIOPSY Right  09/02/2015  . CESAREAN SECTION    . TUBAL LIGATION     Allergies  Allergen Reactions  . Eggs Or Egg-Derived Products     Shortness of breath, wheezing    Social History   Social History  . Marital status: Married    Spouse name: N/A  . Number of children: 3  . Years of education: N/A   Occupational History  . 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 Smoking status: Never Smoker  . Smokeless tobacco: Never Used  . Alcohol use No  . Drug use: No  . Sexual activity: Not Currently   Other Topics Concern  . Not on file   Social History Narrative   3 biological children   1 adopted child   3 step-children   Household-- pt , husband and a daughter          Family History  Problem Relation Age of Onset  . Heart disease Mother     pacemaker   . Hypertension Mother   . Hyperlipidemia Mother   . Asthma Father   . Pancreatic cancer Brother   . Breast cancer Maternal Aunt   . Colon cancer Neg Hx        Objective:    BP 126/82 (BP Location: Right Arm, Patient Position: Sitting, Cuff Size: Small)   Pulse (!) 58   Temp 98.1 F (36.7 C) (Oral)   Resp 18   Ht 5\' 5"  (1.651 m)   Wt 210 lb (95.3 kg)  SpO2 96%   BMI 34.95 kg/m  Physical Exam  Constitutional: She is oriented to person, place, and time. She appears well-developed and well-nourished. No distress.  HENT:  Head: Normocephalic and atraumatic.  Right Ear: Tympanic membrane, external ear and ear canal normal.  Left Ear: Tympanic membrane, external ear and ear canal normal.  Nose: Nose normal. Right sinus exhibits no maxillary sinus tenderness and no frontal sinus tenderness. Left sinus exhibits no maxillary sinus tenderness and no frontal sinus tenderness.  Mouth/Throat: Uvula is midline, oropharynx is clear and moist and mucous membranes are normal.  Eyes: Conjunctivae are normal. Pupils are equal, round, and reactive to light.  Neck: Normal range of motion. Neck supple.    Cardiovascular: Normal rate, regular rhythm and normal heart sounds.  Exam reveals no gallop and no friction rub.   No murmur heard. Pulmonary/Chest: Effort normal and breath sounds normal. No respiratory distress. She has no wheezes. She has no rales.  Neurological: She is alert and oriented to person, place, and time.  Skin: She is not diaphoretic.  Psychiatric: She has a normal mood and affect. Her behavior is normal.  Nursing note and vitals reviewed.  Results for orders placed or performed in visit on 11/18/15  HM PAP SMEAR  Result Value Ref Range   HM Pap smear patient self report    PEAK FLOW: 250, 250, 250 (PREDICTED 400).     Assessment & Plan:   1. Acute upper respiratory infection   2. Moderate persistent asthma with acute exacerbation   3. Impaired glucose tolerance    -New.  URI versus allergic rhinitis yet pt denies history of allergic rhinitis. -rx for Augmentin, Prednisone provided. -restart Atrovent nasal spray which was prescribed at prior visit; continue Mucinex bid. -continue Albuterol qid scheduled for the next week and then PRN. -RTC for acute worsening. -educated patient on use of QVAR; recommend starting QVAR when completes Prednisone taper.    No orders of the defined types were placed in this encounter.  Meds ordered this encounter  Medications  . amoxicillin-clavulanate (AUGMENTIN) 875-125 MG tablet    Sig: Take 1 tablet by mouth 2 (two) times daily.    Dispense:  20 tablet    Refill:  0  . predniSONE (DELTASONE) 20 MG tablet    Sig: Take 3 PO QAM x 1 day, 2 PO QAM x 5 days, 1 PO QAM x 5 days    Dispense:  18 tablet    Refill:  0    Return if symptoms worsen or fail to improve.   Kristi Elayne Guerin, M.D. [facility] [address] [place], [place]  [address] [phone] phone [phone] fax

## 2015-11-21 NOTE — Patient Instructions (Addendum)
IF you received an x-ray today, you will receive an invoice from [facility]. Please contact [facility] at [phone] with questions or concerns regarding your invoice.   IF you received labwork today, you will receive an invoice from [facility]. Please contact [facility] at [phone] with questions or concerns regarding your invoice.   Our billing staff will not be able to assist you with questions regarding bills from these companies.  You will be contacted with the lab results as soon as they are available. The fastest way to get your results is to activate your My Chart account. Instructions are located on the last page of this paperwork. If you have not heard from [name] regarding the results in 2 weeks, please contact this office.     Asthma, Acute Bronchospasm Acute bronchospasm caused by asthma is also referred to as an asthma attack. Bronchospasm means your air passages become narrowed. The narrowing is caused by inflammation and tightening of the muscles in the air tubes (bronchi) in your lungs. This can make it hard to breathe or cause you to wheeze and cough. CAUSES Possible triggers are:  Animal dander from the skin, hair, or feathers of animals.  Dust mites contained in house dust.  Cockroaches.  Pollen from trees or grass.  Mold.  Cigarette or tobacco smoke.  Air pollutants such as dust, household cleaners, hair sprays, aerosol sprays, paint fumes, strong chemicals, or strong odors.  Cold air or weather changes. Cold air may trigger inflammation. Winds increase molds and pollens in the air.  Strong emotions such as crying or laughing hard.  Stress.  Certain medicines such as aspirin or beta-blockers.  Sulfites in foods and drinks, such as dried fruits and wine.  Infections or inflammatory conditions, such as a flu, cold, or inflammation of the nasal membranes (rhinitis).  Gastroesophageal reflux disease  (GERD). GERD is a condition where stomach acid backs up into your esophagus.  Exercise or strenuous activity. SIGNS AND SYMPTOMS   Wheezing.  Excessive coughing, particularly at night.  Chest tightness.  Shortness of breath. DIAGNOSIS  Your health care provider will ask you about your medical history and perform a physical exam. A chest X-ray or blood testing may be performed to look for other causes of your symptoms or other conditions that may have triggered your asthma attack. TREATMENT  Treatment is aimed at reducing inflammation and opening up the airways in your lungs. Most asthma attacks are treated with inhaled medicines. These include quick relief or rescue medicines (such as bronchodilators) and controller medicines (such as inhaled corticosteroids). These medicines are sometimes given through an inhaler or a nebulizer. Systemic steroid medicine taken by mouth or given through an IV tube also can be used to reduce the inflammation when an attack is moderate or severe. Antibiotic medicines are only used if a bacterial infection is present.  HOME CARE INSTRUCTIONS   Rest.  Drink plenty of liquids. This helps the mucus to remain thin and be easily coughed up. Only use caffeine in moderation and do not use alcohol until you have recovered from your illness.  Do not smoke. Avoid being exposed to secondhand smoke.  You play a critical role in keeping yourself in good health. Avoid exposure to things that cause you to wheeze or to have breathing problems.  Keep your medicines up-to-date and available. Carefully follow your health care provider's treatment plan.  Take your medicine exactly as prescribed.  When pollen or pollution is bad,  keep windows closed and use an air conditioner or go to places with air conditioning.  Asthma requires careful medical care. See your health care provider for a follow-up as advised. If you are more than [redacted] weeks pregnant and you were prescribed  any new medicines, let your obstetrician know about the visit and how you are doing. Follow up with your health care provider as directed.  After you have recovered from your asthma attack, make an appointment with your outpatient doctor to talk about ways to reduce the likelihood of future attacks. If you do not have a doctor who manages your asthma, make an appointment with a primary care doctor to discuss your asthma. SEEK IMMEDIATE MEDICAL CARE IF:   You are getting worse.  You have trouble breathing. If severe, call your local emergency services (911 in the [place]  You develop chest pain or discomfort.  You are vomiting.  You are not able to keep fluids down.  You are coughing up yellow, green, brown, or bloody sputum.  You have a fever and your symptoms suddenly get worse.  You have trouble swallowing. MAKE SURE YOU:   Understand these instructions.  Will watch your condition.  Will get help right away if you are not doing well or get worse.   This information is not intended to replace advice given to you by your health care provider. Make sure you discuss any questions you have with your health care provider.   Document Released: 05/11/2006 Document Revised: 01/29/2013 Document Reviewed: 08/01/2012 Elsevier Interactive Patient Education [employer].

## 2015-12-14 DIAGNOSIS — L308 Other specified dermatitis: Secondary | ICD-10-CM | POA: Diagnosis not present

## 2015-12-14 MED FILL — DESONIDE 0.05% OINTMENT: 0.05 | 10 days supply | Qty: 60 | Fill #0

## 2015-12-14 MED FILL — TRIAMCINOLONE 0.1% OINTMENT: 0.1 | 14 days supply | Qty: 240 | Fill #0

## 2015-12-18 MED FILL — AZITHROMYCIN 500 MG TABLET: 500 | 3 days supply | Qty: 3 | Fill #0

## 2015-12-18 MED FILL — CHLOROQUINE PH 250 MG TAB: 250 | 56 days supply | Qty: 16 | Fill #0

## 2015-12-22 ENCOUNTER — Other Ambulatory Visit: Payer: Self-pay

## 2015-12-22 VITALS — BP 122/88 | HR 57 | Resp 14 | Ht 65.0 in | Wt 211.4 lb

## 2015-12-22 DIAGNOSIS — R7302 Impaired glucose tolerance (oral): Secondary | ICD-10-CM

## 2015-12-22 DIAGNOSIS — I10 Essential (primary) hypertension: Secondary | ICD-10-CM

## 2015-12-22 NOTE — Patient Instructions (Signed)
1. Plan to eat the healthy plate method and the Dietary Approaches to stop hypertension diet. 2. Plan to eat 30-45 GM (2-3 servings) of carbohydrates a meal and 15GM for snacks.  Plan eat protein with snacks.  Try to limit chips 3. Plan to walk 30 minutes 5 days a week 4. Plan to check B/P 1-2 times a week at different times and write on log.  Plan to bring log to next visit 5. Plan to continue the Diabetes Prevention Program Plan to return to [facility] on 04/05/16 at [facility]

## 2015-12-22 NOTE — Patient Outreach (Signed)
[place] [facility]) Care Management   12/22/2015  Brenda Russo [birth year] [id number]  Brenda Russo is an 60 y.o. female.   Member seen for follow up office visit for Link to Wellness program for self management of Hypertension and impaired glucose tolerance  Subjective: Member states that she is taking her B/P medications daily as ordered.  States that her B/P readings have been good for the last month.  States that she is still going to the diabetes prevention program and she has learned a lot from the program.  States she is going to the [employer] once a week for a class and she is walking daily.  States she averaged 12,000 to 20,000 steps a day.  States she did eat more Halloween candy but she is trying to get back on track.  States she does like to eat chips as a snack.  Objective:   Review of Systems  All other systems reviewed and are negative.   Physical Exam Today's Vitals   12/22/15 1515 12/22/15 1520  BP: 122/88   Pulse: (!) 57   Resp: 14   SpO2: 97%   Weight: 211 lb 6.4 oz (95.9 kg)   Height: 1.651 m (5\' 5" )   PainSc: 0-No pain 0-No pain   Encounter Medications:   Outpatient Encounter Prescriptions as of 12/22/2015  Medication Sig  . albuterol (PROVENTIL) (2.5 MG/3ML) 0.083% nebulizer solution Take 3 mLs (2.5 mg total) by nebulization 3 (three) times daily as needed.  Marland Kitchen albuterol (VENTOLIN HFA) 108 (90 Base) MCG/ACT inhaler Inhale 2 puffs into the lungs every 4 (four) hours as needed for wheezing or shortness of breath.  Marland Kitchen amLODipine (NORVASC) 5 MG tablet Take 1 tablet (5 mg total) by mouth daily.  . Ascorbic Acid (VITAMIN C) 1000 MG tablet Take 1,000 mg by mouth daily.  . B Complex-C-Folic Acid (B-COMPLEX BALANCED PO) Take by mouth.  . beclomethasone (QVAR) 80 MCG/ACT inhaler Inhale 2 puffs into the lungs daily.  . Cholecalciferol (VITAMIN D3) 5000 UNITS CAPS Take 1 capsule by mouth daily.  . Coenzyme Q10 (COQ10) 100 MG CAPS Take 1 capsule by  mouth daily.  Marland Kitchen losartan-hydrochlorothiazide (HYZAAR) 100-12.5 MG tablet Take 1 tablet by mouth daily. (Patient taking differently: Take 1 tablet by mouth as needed. )  . magnesium oxide (MAG-OX) 400 MG tablet 2 tablets daily. 400-800 mg daily  . Multiple Minerals-Vitamins (CALCIUM CITRATE PLUS/MAGNESIUM) TABS Take 1 tablet by mouth.  . Multiple Vitamins-Minerals (COMPLETE WOMENS PO) Take 1 capsule by mouth 2 (two) times daily.    . Omega-3 Fatty Acids (FISH OIL) 1000 MG CAPS Take 1 capsule by mouth.  Marland Kitchen amoxicillin-clavulanate (AUGMENTIN) 875-125 MG tablet Take 1 tablet by mouth 2 (two) times daily. (Patient not taking: Reported on 12/22/2015)  . predniSONE (DELTASONE) 20 MG tablet Take 3 PO QAM x 1 day, 2 PO QAM x 5 days, 1 PO QAM x 5 days (Patient not taking: Reported on 12/22/2015)   No facility-administered encounter medications on file as of 12/22/2015.     Functional Status:   In your present state of health, do you have any difficulty performing the following activities: 12/22/2015 06/10/2015  Hearing? N N  Vision? N N  Difficulty concentrating or making decisions? N N  Walking or climbing stairs? N N  Dressing or bathing? N N  Doing errands, shopping? N N  Some recent data might be hidden    Fall/Depression Screening:    PHQ 2/9 Scores 12/22/2015 11/21/2015 11/18/2015  09/22/2015 06/27/2015 06/10/2015 05/20/2015  PHQ - 2 Score 0 0 0 0 0 0 0    Assessment:  Member seen for follow up office visit for Link to Wellness program for self management of Hypertension and prediabetes. Member is now is back on B/P medications and she is meeting goal of B/P readings of 140/90 or less.Member had hemoglobin A1C increased to 6.2% with last MD visit.POC Hemoglobin A1C decreased to 5.9% when last checked Reports getting average of 12,000-20,000 steps a day and following a low sodium diet. Member is now going to the Diabetes Prevention Program.  Plan to close case at next visit if remains stable and  achieving goals.  Plan:  1. Plan to eat the healthy plate method and the Dietary Approaches to stop hypertension diet. 2. Plan to eat 30-45 GM (2-3 servings) of carbohydrates a meal and 15GM for snacks.  Plan eat protein with snacks.  Try to limit chips 3. Plan to walk 30 minutes 5 days a week 4. Plan to check B/P 1-2 times a week at different times and write on log.  Plan to bring log to next visit 5. Plan to continue the Diabetes Prevention Program Plan to return to Link to Wellness on 04/05/16 at [place] Problem One   Flowsheet Row Most Recent Value  Care Plan Problem One  Elevated blood pressures  Role Documenting the Problem One  Care Management [place] for Problem One  Active  [facility] Long Term Goal (31-90 days)  Member will lower blood pressure below 140/90 80% of readings as evidenced by log  [facility] Long Term Goal Start Date  12/22/15 [continued improved readings since starting medications]  Interventions for Problem One Long Term Goal   Reinforced on goals for B/P and to check her B/P 2-3 times a week at different times, Reinforced to bring log to next visit,  Reinforced to follow the dietary approaches to stopping hypertension, Instructed to try to limit eating chips which are high in sodium and CHO, Reinforced that even losing 5- 10% weight can effect her B/P and blood sugars, Reinforced to take medications daily to keep B/P lower, Reinforced on importance of regular exercise to help control her B/P and lose weight     [facility] CM Care Plan Problem Two   Flowsheet Row Most Recent Value  Care Plan Problem Two  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  Role Documenting the Problem Two  Care Management Coordinator  Care Plan for Problem Two  Active  Interventions for Problem Two Long Term Goal   Reinforced on what foods have CHO and CHO counting,  Encouraged to eat more protein and to have protein with her snacks,  Reinforced instructions on  ways to decrease her hemoglobin A1C through weight loss and exercise, Reinforced to continue going to the Diabetes Prevention Program,   [facility] Long Term Goal (31-90) days  Member will decrease hemoglobin A1C to 6 or below in the next 90 days  [facility] Long Term Goal Start Date  12/22/15    Peter Garter RN, [facility] Care Management Coordinator-Link to [place] Management [phone]

## 2015-12-24 ENCOUNTER — Ambulatory Visit (INDEPENDENT_AMBULATORY_CARE_PROVIDER_SITE_OTHER)
Admission: RE | Admit: 2015-12-24 | Discharge: 2015-12-24 | Disposition: A | Discharge status: Home / Self Care | Payer: 59 | Source: Ambulatory Visit | Attending: Pulmonary Disease | Admitting: Pulmonary Disease

## 2015-12-24 ENCOUNTER — Ambulatory Visit (INDEPENDENT_AMBULATORY_CARE_PROVIDER_SITE_OTHER): Payer: 59 | Admitting: Pulmonary Disease

## 2015-12-24 ENCOUNTER — Encounter: Payer: Self-pay | Admitting: Pulmonary Disease

## 2015-12-24 ENCOUNTER — Other Ambulatory Visit: Payer: Self-pay | Admitting: Internal Medicine

## 2015-12-24 ENCOUNTER — Telehealth: Payer: Self-pay | Admitting: Pulmonary Disease

## 2015-12-24 VITALS — BP 114/82 | HR 72 | Temp 97.0°F | Ht 65.0 in | Wt 210.0 lb

## 2015-12-24 DIAGNOSIS — R05 Cough: Secondary | ICD-10-CM

## 2015-12-24 DIAGNOSIS — J4520 Mild intermittent asthma, uncomplicated: Secondary | ICD-10-CM | POA: Diagnosis not present

## 2015-12-24 DIAGNOSIS — K219 Gastro-esophageal reflux disease without esophagitis: Secondary | ICD-10-CM | POA: Insufficient documentation

## 2015-12-24 DIAGNOSIS — R059 Cough, unspecified: Secondary | ICD-10-CM

## 2015-12-24 DIAGNOSIS — G4733 Obstructive sleep apnea (adult) (pediatric): Secondary | ICD-10-CM | POA: Diagnosis not present

## 2015-12-24 DIAGNOSIS — K449 Diaphragmatic hernia without obstruction or gangrene: Secondary | ICD-10-CM | POA: Insufficient documentation

## 2015-12-24 DIAGNOSIS — J309 Allergic rhinitis, unspecified: Secondary | ICD-10-CM

## 2015-12-24 DIAGNOSIS — R0602 Shortness of breath: Secondary | ICD-10-CM | POA: Diagnosis not present

## 2015-12-24 DIAGNOSIS — E663 Overweight: Secondary | ICD-10-CM

## 2015-12-24 MED ORDER — PANTOPRAZOLE SODIUM 40 MG PO TBEC: 40.0000 mg | DELAYED_RELEASE_TABLET | Freq: Every day | ORAL | 3 refills | Status: DC

## 2015-12-24 MED ORDER — FLUTICASONE FUROATE-VILANTEROL 200-25 MCG/INH IN AEPB: 1.0000 | INHALATION_SPRAY | Freq: Every day | RESPIRATORY_TRACT | 5 refills | Status: DC

## 2015-12-24 MED ORDER — FLUTICASONE FUROATE-VILANTEROL 200-25 MCG/INH IN AEPB: 1.0000 | INHALATION_SPRAY | Freq: Every day | RESPIRATORY_TRACT | 0 refills | Status: DC

## 2015-12-24 MED ORDER — FLUTICASONE FUROATE-VILANTEROL 100-25 MCG/INH IN AEPB: 1.0000 | INHALATION_SPRAY | Freq: Every day | RESPIRATORY_TRACT | 3 refills | Status: DC

## 2015-12-24 MED ORDER — ALBUTEROL SULFATE HFA 108 (90 BASE) MCG/ACT IN AERS: 1.0000 | INHALATION_SPRAY | RESPIRATORY_TRACT | 5 refills | Status: DC | PRN

## 2015-12-24 MED FILL — BREO ELLIPTA 200-25 MCG INH: 200-25 | 30 days supply | Qty: 60 | Fill #0

## 2015-12-24 MED FILL — VENTOLIN HFA 90 MCG INHALER: 108 (90 BAS | 25 days supply | Qty: 18 | Fill #0

## 2015-12-24 MED FILL — AMLODIPINE BESYLATE 5 MG TA: 5 | 30 days supply | Qty: 30 | Fill #0

## 2015-12-24 MED FILL — PANTOPRAZOLE SOD DR 40 MG T: 40 | 90 days supply | Qty: 90 | Fill #0

## 2015-12-24 MED FILL — LOSARTAN-HCTZ 100-12.5 MG T: 100-12.5 | 30 days supply | Qty: 30 | Fill #0

## 2015-12-24 NOTE — Progress Notes (Signed)
Subjective:    Patient ID: Brenda Russo, female    DOB: 03/11/1955, 60 y.o.   MRN: [id number]  HPI 60 y/o BF, [employer] employee, followed for AR, Asthma, and r/o OSA; she now sees DrPaz for PCP w/ hx HBP, HL, overweight, neg thyroid eval, OA, VitD defic, anxiety; she also sees DrJacobs for GI... ~  SEE PREV EPIC NOTES FOR OLDER DATA >>    LABS 10/13:  FLP- not at goal on diet w/ LDL=131;  Chems- wnl;  CBC- wnl;  TSH=2.26;  VitD= 29...  LABS 5/14:  Chems- wnl;  CBC- wnl;  Ca125=11.9 (0-30); Sed=28...  CT Abd&Pelvis 6/14> tiny HH, sm fat containing inguinal hernias bilat, no adenopathy, no acute findings- int organs all ok...  Sleep Study 6/14 showed AHI=9, mod snoring, transient hypoxemia to 84%, occas PVCs, otherw neg...  ~  November 13, 2012:  54mo ROV & [name] had her sleep study & consult from DrClance- she is considering oral appliance vs CPAP trial while she tries to lose weight...     Her breathing is doing good she says; no resp exac, she has DOE w/ exercise- stable & asked to incr exercise program...    BP is fair on diet alone & measures 132/90 today; weight= 216# which is down 5# from prev; reminded to elim sodium...     Her prev GI symptoms resolved w/ Bentyl Rx & have not recurred, BMs are wnl she says...    She tells me that she saw DrCousins for Gyn & had a neg gyn eval & check up 6/14...    Vit D level is still sl low today at 29 despite 1000u OTC VitD supplement & she is asked to double it... We reviewed prob list, meds, xrays and labs> see below for updates >> she requested [facility] form completed to exempt from Flu shot claiming an allergy to Eggs & egg protein (severe SOB & wheezing).  LABS 10/14:  FLP- at goals on diet alone;  Chems- wnl;  CBC- wnl;  TSH=1.23;  VitD=29...  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  She notes that something similar happened last yr & she went to [facility]- ?Dx, given anti-inflamm pain med, symptoms eventually resolved; she notes that she is under stress caring for her elderly mother who had to move in w/ her...     After long discussion we decided to treat w/ rest, heat, Pred dosepak, MMW prn... I told her we may need to consider Klonopin Bid, or additional eval from ENT &/or GI...     Her Asthma is under good control w/o exac; her only prescription meds include AlbutHFA and NEB w/ Albut0.083% for prn use...    Her weight is the same (216# for BMI=36) and her  BP= 120/88 on diet alone; we reviewed low sodium weight reducing diet...  ~  September 03, 2014:  67mo [place] has been doing well- just notes incr allergy symptoms recently, using OTC meds which help;  She is working at [employer], 3rd shift & part time... We reviewed the following medical problems during today's office visit >>     R/O OSA> she notes snoring, tired all the time, & had Sleep Study w/ f/u DrClance- rec oral appliance vs CPAP, she didn't do either; she saw DrShowmaker for ENT as well...     AR/ Asthma> on VENTOLIN HFA prn (& refuses Proair- "it doesn't work for me"); no recent asthma attacks or exac...    HBP> prev on Amlodipine & Hyzaar; BP= 160/84 today but works 3rd shift, home BP checks all wnl she says & ave 120s/70s; she denies HAs, CP, palpit, ch in SOB, edema; she doesn't want  meds...    Chol> on diet alone; FLP 4/16 showed TChol 151, TG 49, HDL 51, LDL 90; she has refused med Rx butr improved on diet- we discussed a low chol/ low fat diet...    Thyroid/ Endocrine> she is s/p thorough endocrine eval by DrKerr 2/14- everything was normal, no endocrinopathy found; he rec sleep study & Psyche referral...    Overweight> wt is down to 215# - "I watch what I eat" and we discussed incr exercise...    GI- Abd pain 5/14=> likely IBS> treated w/ Bentyl & improved; last colonoscopy 7/10 by DrJacobs was neg, f/u planned 10 yrs..    DJD> on Mobic as needed    Anxiety> she was intol to Alprazolam; offered SSRI for chemical imbalance vs Psyche referral but she declines... EXAM reveals Afeb, BP=160/84, wt=215#, ht=5'5";  HEENT- neg, mallampati1;  Chest- clear w/o w/r/r;  Heart-  RR w/o m/r/g;  Abd- soft, neg;  Ext- neg w/o c/c/e...  LABS 08/2014>  Chems- ok x BS=112, A1c=6.0;  FLP- at goals;  VitD=49... IMP/PLAN>>  Brenda Russo is basically stable, continue same Rx...   ~  December 24, 2015:  51mo ROV & add-on appt requested for recent asthma flair> Brenda Russo presents w/ cough, chest congestion & heaviness, she denies sput/ hemoptysis, notes mild wheezing and SOB; she is concerned about chest pressure/ heaviness- esp when supine & says cough is worse supine but she denies GERD, reflux, dysphagia, etc (she tells me that she saw a "hollistic lady" for massage & was told she has a hernia... She went to an Urgent Care recently & was placed on Augmentin & Prednisone, congestion persists and she's concerned because she is going OOT in early Dec...     R/O OSA> hx of snoring, tired all the time, & had Sleep Study w/ f/u DrClance 2014- rec oral appliance vs CPAP, she didn't do either; she saw DrShoemaker for ENT as well...     AR/ Asthma> on VENTOLIN HFA (using it 2-3x/d recently- note: refuses Proair- "it doesn't work for me"); has NEB but not using, supposed to be on Qvar80 but didn't refill; we checked CXR  & Spirometry- see below>    Medical issues> per DrPaz etal- HBP (Hyzaar & Amlod5), HL FishOil & CoQ10), overwt, ?thyroid, IBS, OA (OTC rx), VitD defic (supplement daily), anxiety... EXAM reveals Afeb, BP=114/82, O2sat=98% on RA, wt=210#, ht=5'5";  HEENT- neg, mallampati1;  Chest- few bibasilar rhonchi w/o wheezing, rales, consolidation;  Heart-  RR w/o m/r/g;  Abd- soft, neg;  Ext- neg w/o c/c/e...  CXR11/16/17 (independently  reviewed by me in the PACS system)>  Norm heart size, clear lungs, NAD...  Spirometry 12/24/15>  FVC=2.75 (98%), FEV1=1.68 (76%), %1sec=61, mid-flows reduced at 39% predicted; I explained to her that this represents MOD airflow obstruction & she needs to stay on a controller med! IMP/PLAN>  Brenda Russo has asthma & I have been concerned about a fixed obstructive component (only old PFTs in her remote paper chart); she has declined to take an ICS/LABA regularly; we dicussed this again today & I have rec BREO- one puff daily, continue the VentolinHFA prn, plus a vigorous antireflux regimen w/ Protonix40 before dinner, NPO after dinner, elev HOB 6"... we plan ROV recheck in 36mo.          Problem List:     ALLERGIC RHINITIS (ICD-477.9) - she uses OTC meds as needed.  MILD OSA >>  ~  Sleep Study 6/14 showed AHI=9, mod snoring, transient hypoxemia to 84%, occas PVCs, otherw neg... ~  Sleep consult DrClance 9/14> awakes tired, not rested, & ESS=18; the events were noted to occur mostly during REM; Rec to work on wt reduction & consider dental appliance vs CPAP...   ASTHMA (ICD-493.90) - only using Ventolin HFA as needed (she has NEB w/ Albuterol as well)... she hasn't been checking peak flows etc... she notes that she averages one inhalation daily- usually in the AM... she understands that she is not on a controller drug, but she does not wish to change to Symbicort, Advair, Pulmicort, etc (despite my recommendation to start and stay on one of these medications)...  ~  CXR 10/12 showed  norm heart size, clear lungs x mild bilat apical pleural scarring, NAD...   HYPERTENSION (ICD-401.9) - prev on Norvasc & Hyzaar w/ good control of BP, she stopped on her own 4/13... ~  1/12:  BP= 140/94 on Hyzaar100/12.5 & we decided to add NORVASC 5mg /d... ~  5/12:  Pt reports that she decr the Amlodipine 5mg  to1/2 tab daily & BP checks at home have been good (110/80 here today). ~  EKG 6/12 showed SBrady, rate50, wnl, NAD... ~  10/12:  Pt reports that she decr both meds to 1/2tab daily due to dizziness & improved... ~  4/13:  She tells me she STOPPED both BP meds 3wks ago when she changed jobs at [employer] now on 3rd shift job; states she feels better off the meds; BP= 142/90 & asked to keep log of BP reading for [name] to review together in f/u appt; reviewed lifestyle mod, no salt, wt reduction, etc; a nurse friend suggested that she check her Cortisol level (it was normal). ~  7/13:  BP= 150/100 but she states all BP checks at home have been good; blames it on night shft job & trying to go to day job, remionded to elim salt & get wt down! ~  10/13:  BP= 144/92 & she is fatigued; denies CP, palpit, SOB, edema... ~  10/14: BP is fair on diet alone & measures 132/90 today; weight= 216# which is down 5# from prev; reminded to elim sodium...  HYPERCHOLESTEROLEMIA, BORDERLINE (ICD-272.4) - on diet Rx alone... She refuses med rx for her lipids. ~  FLP 12/08 showed TChol 178, TG 35, HDL 58, LDL 113 ~  FLP 11/10 showed TChol 168, TG 45, HDL 57, LDL 102 ~  FLP 10/11 showed TChol 185, TG 49, HDL 57, LDL 118 ~  FLP 10/12 showed TChol 164, TG 30, HDL 59, LDL 99... Great, keep up the good work! ~  FLP 10/13 showed TChol 192, TG 46, HDL 52, LDL 136... She refuses meds, offered Lipid clinic. ~  [facility] 10/14 on diet alone showed TChol 166, TG 37, HDL 56, LDL 102  OVERWEIGHT (ICD-278.02) - we discussed diet + exercise program... ~  weight 1/07 = 181# ~  weight 6/09 = 206# ~  weight 11/10 = 213# ~  weight 10/11 =  213# ~  Weight 5/12 = 219#... Despite numerous diet attempts & fads. ~  Weight 10/12 = 212# ~  Weight 4/13 = 216#... We reviewed low carb, low fat diet. ~  Weight 7/13 = 219# ~  Weight 10/13 = 225# ~  Weight 5/14 = 222# ~  Weight 10/14 = 216#  THYROID >> She presented 10/13 c/o being fatigued, cold, etc & she read on the internet that this could be hypothyroidism; I explained to her that her thyroid function as been normal (normal TSH) & this rules out hypothy, but she rejects this, says she read on the internet from "DrWiggy" that she needs a ReverseT3 checked but I declined to order this test;  She wants to have an Endocrine consult regarding her thyroid & we will refer per her request... ~  Labs 10/13 showed TSH= 2.26; Thyroid exam is neg- no goiter, no nodules; she started taking Iodine on her own & I advised her to stop... ~  1-2/14:  She had Endocrine eval by DrKerr> notes reviewed- no evid for any endocrinopathy (thyroid, adrenal, pituitary); she is c/o fatigue, depressed mood, difficulty concentrating, impaired memory, etc; also noted snoring & wanted her to get a sleep study & consider antidepressant meds vs Psyche referral... (Sleep study w/ AHI=9 & she is considering Rx w/ oral appliance vs CPAP while trying to lose the weight). ~  Labs 10/14 showed TSH= 1.23  GI >> Routine colonoscopy 7/10 by DrJacobs done for rectal bleeding; Colon was normal/ negative/ no abnormalities seen & repeat rec in 14yrs...  DEGENERATIVE ARTHRITIS>  She has DJD/ OA & uses OTC anti-inflamm meds; prev tried Mobic 7.5mg  Prn... ~  Labs 10/14 showed VitD level = 29 and she is encouraged to incr the VitD supplement to 2000u daily.  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 ~  5/14:  We reviewed DrKerr's assessment & offered meds for depression but she declines, offered Psyche referral for "chemical imbalance" but  she declines...  GENERAL HEALTH:  she sees DrCousins for GYN & she is due PAP, Mammograms, etc... had colonoscopy 7/10 by DrJacobs= neg, no polyps etc... f/u plannned 78yrs.  Past Medical History:  Diagnosis Date  . Allergic rhinitis   . Anxiety   . Asthma   . HTN (hypertension)   . Hyperlipidemia    borderline  . OSA (obstructive sleep apnea)    mild ,no CPAP  . Overweight(278.02)   . Plantar wart    history    Past Surgical History:  Procedure Laterality Date  . BREAST BIOPSY Right 09/02/2015  . CESAREAN SECTION    . TUBAL LIGATION      Outpatient Encounter Prescriptions as of 12/24/2015  Medication Sig Dispense Refill  . albuterol (PROVENTIL) (2.5 MG/3ML) 0.083% nebulizer solution Take 3 mLs (2.5 mg total) by nebulization 3 (three) times daily as needed. 75 mL 6  . albuterol (VENTOLIN HFA) 108 (90 Base) MCG/ACT inhaler Inhale 1-2 puffs into the lungs every 4 (four) hours as needed for wheezing. [place]  g 5  . Ascorbic Acid (VITAMIN C) 1000 MG tablet Take 1,000 mg by mouth daily.    . B Complex-C-Folic Acid (B-COMPLEX BALANCED PO) Take by mouth.    . beclomethasone (QVAR) 80 MCG/ACT inhaler Inhale 2 puffs into the lungs daily      (not using regularly) 1 Inhaler 12  . Cholecalciferol (VITAMIN D3) 5000 UNITS CAPS Take 1 capsule by mouth daily.    . Coenzyme Q10 (COQ10) 100 MG CAPS Take 1 capsule by mouth daily.    . magnesium oxide (MAG-OX) 400 MG tablet 2 tablets daily. 400-800 mg daily    . Multiple Minerals-Vitamins (CALCIUM CITRATE PLUS/MAGNESIUM) TABS Take 1 tablet by mouth.    . Multiple Vitamins-Minerals (COMPLETE WOMENS PO) Take 1 capsule by mouth 2 (two) times daily.      . Omega-3 Fatty Acids (FISH OIL) 1000 MG CAPS Take 1 capsule by mouth.    Marland Kitchen  amLODipine (NORVASC) 5 MG tablet Take 1 tablet (5 mg total) by mouth daily. 30 tablet 5  .  losartan-hydrochlorothiazide (HYZAAR) 100-12.5 MG tablet Take 1 tablet by mouth daily. (Patient taking differently: Take 1 tablet by  mouth as needed. ) 30 tablet 5  . [DISCONTINUED] amoxicillin-clavulanate (AUGMENTIN) 875-125 MG tablet Take 1 tablet by mouth 2 (two) times daily. (Patient not taking: Reported on 12/24/2015) 20 tablet 0  . [DISCONTINUED] predniSONE (DELTASONE) 20 MG tablet Take 3 PO QAM x 1 day, 2 PO QAM x 5 days, 1 PO QAM x 5 days (Patient not taking: Reported on 12/24/2015) 18 tablet 0    Allergies  Allergen Reactions  . Eggs Or Egg-Derived Products     Shortness of breath, wheezing    Current Medications, Allergies, Past Medical History, Past Surgical History, Family History, and Social History were reviewed in [employer] record.    Review of Systems      See HPI - all other systems neg except as noted...  The patient denies anorexia, fever, weight loss, weight gain, vision loss, decreased hearing, hoarseness, chest pain, syncope, dyspnea on exertion, peripheral edema, prolonged cough, headaches, hemoptysis, abdominal pain, melena, hematochezia, severe indigestion/heartburn, hematuria, incontinence, muscle weakness, suspicious skin lesions, transient blindness, difficulty walking, depression, unusual weight change, abnormal bleeding, enlarged lymph nodes, and angioedema.   Objective:   Physical Exam     WD, WN, 60 y/o BF in NAD... GENERAL:  Alert & oriented; pleasant & cooperative... HEENT:  [place]/AT, EOM-wnl, PERRLA, EACs-clear, TMs-wnl, NOSE-clear, THROAT-clear & wnl. NECK:  Supple w/ full ROM; no JVD; normal carotid impulses w/o bruits; no thyromegaly or nodules palpated; no lymphadenopathy. CHEST:  Few bibasilar rhonchi; without wheezes or rales; no signs of consolidation. HEART:  Regular Rhythm; without murmurs/ rubs/ or gallops detected... ABDOMEN:  Soft & nontender; normal bowel sounds; no organomegaly or masses palpated... EXT: without deformities or arthritic changes; no varicose veins/ +venous insuffic/ no edema. NEURO:  CN's intact; motor testing normal; sensory  testing normal; gait normal & balance OK. DERM:  No lesions noted; no rash etc...  RADIOLOGY DATA:  Reviewed in the EPIC EMR & discussed w/ the patient...  LABORATORY DATA:  Reviewed in the EPIC EMR & discussed w/ the patient...   Assessment & Plan:      Mild OSA>  She declined options for oral appliance vs CPAP; she is trying to lose weight...  ASTHMA>  Not under good control, she needs a "controller med" & we discussed BREO one puff daily & continue the Ventolin vs NEB  prn.Marland Kitchen.  HBP>  She clearly does not want meds! Prev stopped both BP meds on her own when she went to 3rd shift job; we reviewed lifestyle mod, no salt, diet efforts & encouraged wt reduction; she will monitor BP & keep a log for DrPaz to review... Advised that she MUST lose wt & elim salt to keep her BP down...  Hx CAROTIDYNIA>> Her prev hx and exam were c/w this diagnosis & treated w/ rest/ heat/ Pred dosepak; I indicated that she may need Klonopin trial as well...  CHOL>  on diet alone w/ FLP improvedl;  Needs better diet & get wt down!!!  OVERWEIGHT>  Wt reduction is key to her BP/ DM/ etc...  GI- HxGERD, LLQ pain>  She is currently denying GERD, reflux symptoms, etc;  CT Abd 6/14 was essent neg=> symptoms resolved w/ Bentyl Rx...  DJD>  She uses OTC analgesics as needed; offered Ortho consult when ready...  ANXIETY>  She does not want med Rx or Psyche referral...   Patient's Medications  New Prescriptions   FLUTICASONE FUROATE-VILANTEROL (BREO ELLIPTA) 200-25 MCG/INH AEPB    Inhale 1 puff into the lungs daily.   FLUTICASONE FUROATE-VILANTEROL (BREO ELLIPTA) 200-25 MCG/INH AEPB    Inhale 1 puff into the lungs daily.   PANTOPRAZOLE (PROTONIX) 40 MG TABLET    Take 1 tablet (40 mg total) by mouth daily. 30 minutes before the last meal of the day  Previous Medications   ALBUTEROL (PROVENTIL) (2.5 MG/3ML) 0.083% NEBULIZER SOLUTION    Take 3 mLs (2.5 mg total) by nebulization 3 (three) times daily as needed.    ASCORBIC ACID (VITAMIN C) 1000 MG TABLET    Take 1,000 mg by mouth daily.   B COMPLEX-C-FOLIC ACID (B-COMPLEX BALANCED PO)    Take by mouth.   BECLOMETHASONE (QVAR) 80 MCG/ACT INHALER    Inhale 2 puffs into the lungs daily.   CHOLECALCIFEROL (VITAMIN D3) 5000 UNITS CAPS    Take 1 capsule by mouth daily.   COENZYME Q10 (COQ10) 100 MG CAPS    Take 1 capsule by mouth daily.   MAGNESIUM OXIDE (MAG-OX) 400 MG TABLET    2 tablets daily. 400-800 mg daily   MULTIPLE MINERALS-VITAMINS (CALCIUM CITRATE PLUS/MAGNESIUM) TABS    Take 1 tablet by mouth.   MULTIPLE VITAMINS-MINERALS (COMPLETE WOMENS PO)    Take 1 capsule by mouth 2 (two) times daily.     OMEGA-3 FATTY ACIDS (FISH OIL) 1000 MG CAPS    Take 1 capsule by mouth.  Modified Medications   Modified Medication Previous Medication   ALBUTEROL (VENTOLIN HFA) 108 (90 BASE) MCG/ACT INHALER albuterol (VENTOLIN HFA) 108 (90 Base) MCG/ACT inhaler      Inhale 1-2 puffs into the lungs every 4 (four) hours as needed for wheezing.    Inhale 2 puffs into the lungs every 4 (four) hours as needed for wheezing or shortness of breath.   AMLODIPINE (NORVASC) 5 MG TABLET amLODipine (NORVASC) 5 MG tablet      Take 1 tablet (5 mg total) by mouth daily.    Take 1 tablet (5 mg total) by mouth daily.   LOSARTAN-HYDROCHLOROTHIAZIDE (HYZAAR) 100-12.5 MG TABLET losartan-hydrochlorothiazide (HYZAAR) 100-12.5 MG tablet      Take 1 tablet by mouth daily.    Take 1 tablet by mouth daily.  Discontinued Medications   AMOXICILLIN-CLAVULANATE (AUGMENTIN) 875-125 MG TABLET    Take 1 tablet by mouth 2 (two) times daily.   PREDNISONE (DELTASONE) 20 MG TABLET  Take 3 PO QAM x 1 day, 2 PO QAM x 5 days, 1 PO QAM x 5 days

## 2015-12-24 NOTE — Telephone Encounter (Signed)
I have called and lmom to make the pt aware that the rx for breo that was sent to [facility] has been cancelled.  Nothing further is needed.

## 2015-12-24 NOTE — Patient Instructions (Signed)
Today we updated your med list in our EPIC system...    Continue your current medications the same...  Today we did a follow up CXR & spiriometry breathing test...    We will contact you w/ the results when available...   We decided to change the QVar to [facility] one puff daily- regularly every day...  Continue the VENTOLIN-HFA rescue inhaler at 1-2 sprays every 6H as needed for wheezing...  Add-in the important ANTIREFLUX REGIMEN>>    Take the PROTONIX 40mg  about 63min before the LAST meal before bedtime...    Do NOT eat or drink anything after this last meal...    Elevate the head of your bed on 6" blocks...  Call for any questions...  Let's plan a follow up visit in 44mo, sooner if needed for problems.Marland KitchenMarland Kitchen

## 2016-02-05 MED FILL — LOSARTAN-HCTZ 100-12.5 MG T: 100-12.5 | 30 days supply | Qty: 30 | Fill #1 | Status: TO

## 2016-02-05 MED FILL — AMLODIPINE BESYLATE 5 MG TA: 5 | 30 days supply | Qty: 30 | Fill #1 | Status: TO

## 2016-02-05 MED FILL — BREO ELLIPTA 200-25 MCG INH: 200-25 | 30 days supply | Qty: 60 | Fill #1 | Status: TO

## 2016-02-05 MED FILL — VENTOLIN HFA 90 MCG INHALER: 108 (90 BAS | 25 days supply | Qty: 18 | Fill #1 | Status: TO

## 2016-02-24 ENCOUNTER — Ambulatory Visit: Payer: Self-pay | Admitting: Pulmonary Disease

## 2016-03-04 MED FILL — AMLODIPINE BESYLATE 5 MG TA: 5 | 90 days supply | Qty: 90 | Fill #0

## 2016-03-04 MED FILL — LOSARTAN-HCTZ 100-12.5 MG T: 100-12.5 | 90 days supply | Qty: 90 | Fill #0

## 2016-03-04 MED FILL — BREO ELLIPTA 200-25 MCG INH: 200-25 | 90 days supply | Qty: 180 | Fill #0

## 2016-03-04 MED FILL — VENTOLIN HFA 90 MCG INHALER: 108 (90 BAS | 90 days supply | Qty: 54 | Fill #0

## 2016-04-05 ENCOUNTER — Ambulatory Visit: Payer: Self-pay

## 2016-04-12 ENCOUNTER — Other Ambulatory Visit: Payer: Self-pay

## 2016-04-12 VITALS — BP 124/80 | HR 44 | Resp 16 | Ht 65.0 in | Wt 205.0 lb

## 2016-04-12 DIAGNOSIS — I10 Essential (primary) hypertension: Secondary | ICD-10-CM

## 2016-04-12 DIAGNOSIS — R7302 Impaired glucose tolerance (oral): Secondary | ICD-10-CM

## 2016-04-12 NOTE — Patient Outreach (Signed)
[facility]) Care Management   04/12/2016  Brenda Russo 08/23/1955 [id number]  Brenda Russo is an 61 y.o. female.   Member seen for follow up office visit for Link to Wellness program for self management of hypertension  Subjective: Member states that she has noticed that her pulse is low at times.  Denies any dizziness, weakness or chest pains.  States she is to see Dr. Lenna Gilford tomorrow and she will discuss with him.  States that her B/P readings at home have been good with the lowest reading of 106/62.  States she has been going to exercise classes at [facility] twice a week and she walks 4-5 times a week.  States she is still going to the Diabetes Prevention Program.  States she is still trying to lose weight and she has an appt with Weight Management on 04/28/16    Objective:   ROS  Physical Exam  Cardiovascular: Regular rhythm.   bradycardia   Today's Vitals   04/12/16 1510 04/12/16 1517  BP: 124/80   Pulse: (!) 44   Resp: 16   SpO2: 98%   Weight: 205 lb (93 kg)   Height: 1.651 m ('5\' 5"' )   PainSc: 0-No pain 0-No pain    Encounter Medications:   Outpatient Encounter Prescriptions as of 04/12/2016  Medication Sig  . albuterol (PROVENTIL) (2.5 MG/3ML) 0.083% nebulizer solution Take 3 mLs (2.5 mg total) by nebulization 3 (three) times daily as needed.  Marland Kitchen albuterol (VENTOLIN HFA) 108 (90 Base) MCG/ACT inhaler Inhale 1-2 puffs into the lungs every 4 (four) hours as needed for wheezing.  Marland Kitchen amLODipine (NORVASC) 5 MG tablet Take 1 tablet (5 mg total) by mouth daily.  . Ascorbic Acid (VITAMIN C) 1000 MG tablet Take 1,000 mg by mouth daily.  . B Complex-C-Folic Acid (B-COMPLEX BALANCED PO) Take by mouth.  . Cholecalciferol (VITAMIN D3) 5000 UNITS CAPS Take 1 capsule by mouth daily.  . Coenzyme Q10 (COQ10) 100 MG CAPS Take 1 capsule by mouth daily.  . fluticasone furoate-vilanterol (BREO ELLIPTA) 200-25 MCG/INH AEPB Inhale 1 puff into the lungs daily.  Marland Kitchen  losartan-hydrochlorothiazide (HYZAAR) 100-12.5 MG tablet Take 1 tablet by mouth daily.  . magnesium oxide (MAG-OX) 400 MG tablet 2 tablets daily. 400-800 mg daily  . Multiple Minerals-Vitamins (CALCIUM CITRATE PLUS/MAGNESIUM) TABS Take 1 tablet by mouth.  . Multiple Vitamins-Minerals (COMPLETE WOMENS PO) Take 1 capsule by mouth 2 (two) times daily.    . Omega-3 Fatty Acids (FISH OIL) 1000 MG CAPS Take 1 capsule by mouth.  . pantoprazole (PROTONIX) 40 MG tablet Take 1 tablet (40 mg total) by mouth daily. 30 minutes before the last meal of the day  . beclomethasone (QVAR) 80 MCG/ACT inhaler Inhale 2 puffs into the lungs daily. (Patient not taking: Reported on 04/12/2016)  . fluticasone furoate-vilanterol (BREO ELLIPTA) 200-25 MCG/INH AEPB Inhale 1 puff into the lungs daily.   No facility-administered encounter medications on file as of 04/12/2016.     Functional Status:   In your present state of health, do you have any difficulty performing the following activities: 04/12/2016 12/22/2015  Hearing? N N  Vision? N N  Difficulty concentrating or making decisions? N N  Walking or climbing stairs? N N  Dressing or bathing? N N  Doing errands, shopping? N N  Some recent data might be hidden    Fall/Depression Screening:    PHQ 2/9 Scores 04/12/2016 12/22/2015 11/21/2015 11/18/2015 09/22/2015 06/27/2015 06/10/2015  PHQ - 2 Score 0 0  0 0 0 0 0    Assessment:  Member seen for follow up office visit for Link to Wellness program for self management of Hypertension and prediabetes. Member is now is back on B/P medications and she is meeting goal of B/P readings of 140/90 or less.Member with POC Hemoglobin A1C decreased to 5.9% when last checkedReports getting average of 12,000-20,000 steps a day and following a low sodium diet. Weight down 6 lb since last visit in November. Member is now going to the Diabetes Prevention Program and plans to go to Weight Management. Member to follow up with provider  concerning bradycardia,Plan to close case as member has  achieved goals  Plan:   1. Plan to eat the healthy plate method and the Dietary Approaches to stop hypertension diet. 2. Plan to eat 30-45 GM (2-3 servings) of carbohydrates a meal and 15GM for snacks.  Plan eat protein with snacks.  Try to limit chips 3. Plan to walk 30 minutes 5 days a week and go to classes twice a week 4. Plan to check B/P 1-2 times a week at different times and write on log.  Plan to continue the Diabetes Prevention Program  [facility] CM Care Plan Problem One   Flowsheet Row Most Recent Value  Care Plan Problem One  Elevated blood pressures  Role Documenting the Problem One  Care Management Coordinator  Care Plan for Problem One  Not Active  [facility] Long Term Goal (31-90 days)  Member will lower blood pressure below 140/90 80% of readings as evidenced by log  [facility] Long Term Goal Start Date  12/22/15 [continued improved readings since starting medications]  [facility] Long Term Goal Met Date  04/12/16  Interventions for Problem One Long Term Goal   Reinforced on goals for B/P and to check her B/P 2-3 times a week at different times, Instructed to discuss lower heart rate with MD tomorrow at her appt,  Reinforced to follow the dietary approaches to stopping hypertension, Reinforced to try to limit eating chips which are high in sodium and CHO, Reinforced that even losing 5- 10% weight can effect her B/P and blood sugars, Reinforced to take medications daily to keep B/P lower, Reinforced on importance of regular exercise to help control her B/P and lose weight     [facility] CM Care Plan Problem Two   Flowsheet Row Most Recent Value  Care Plan Problem Two  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  Role Documenting the Problem Two  Care Management Coordinator  Care Plan for Problem Two  Not Active  Interventions for Problem Two Long Term Goal   Reinforced on what foods have CHO and CHO counting,  Encouraged to eat  more protein and to have protein with her snacks,  Reinforced instructions on ways to decrease her hemoglobin A1C through weight loss and exercise, Reinforced to continue going to the Diabetes Prevention Program,   [facility] Long Term Goal (31-90) days  Member will decrease hemoglobin A1C to 6 or below in the next 90 days  [facility] Long Term Goal Start Date  12/22/15  [facility] Long Term Goal Met Date  04/12/16    Peter Garter RN, [facility] Care Management Coordinator-Link to [place] Management [phone]

## 2016-04-13 ENCOUNTER — Ambulatory Visit (INDEPENDENT_AMBULATORY_CARE_PROVIDER_SITE_OTHER): Payer: 59 | Admitting: Pulmonary Disease

## 2016-04-13 VITALS — BP 118/60 | HR 43 | Temp 97.4°F | Ht 65.0 in | Wt 202.4 lb

## 2016-04-13 DIAGNOSIS — E663 Overweight: Secondary | ICD-10-CM

## 2016-04-13 DIAGNOSIS — J4520 Mild intermittent asthma, uncomplicated: Secondary | ICD-10-CM

## 2016-04-13 DIAGNOSIS — G4733 Obstructive sleep apnea (adult) (pediatric): Secondary | ICD-10-CM

## 2016-04-13 DIAGNOSIS — I493 Ventricular premature depolarization: Secondary | ICD-10-CM

## 2016-04-13 DIAGNOSIS — R001 Bradycardia, unspecified: Secondary | ICD-10-CM

## 2016-04-13 DIAGNOSIS — K219 Gastro-esophageal reflux disease without esophagitis: Secondary | ICD-10-CM

## 2016-04-13 DIAGNOSIS — K449 Diaphragmatic hernia without obstruction or gangrene: Secondary | ICD-10-CM

## 2016-04-13 DIAGNOSIS — J302 Other seasonal allergic rhinitis: Secondary | ICD-10-CM

## 2016-04-13 NOTE — Patient Instructions (Signed)
Today we updated your med list in our EPIC system...     You are recommended to decrease your HYZAAR to 1/2 tab each AM... And decrease your AMLODIPINE to 1/2 tab each PM...  Continue your other medications the same...  We will arrange for a CARDIOLOGY consult for your Bradycardia and PVCs...  ke4p your appt w/ DrPaz in April to get your yearly labs checked...  Call for any questions or if I can be of service in any way.Marland KitchenMarland Kitchen

## 2016-04-13 NOTE — Patient Instructions (Signed)
1. Plan to eat the healthy plate method and the Dietary Approaches to stop hypertension diet. 2. Plan to eat 30-45 GM (2-3 servings) of carbohydrates a meal and 15GM for snacks.  Plan eat protein with snacks.  Try to limit chips 3. Plan to walk 30 minutes 5 days a week and go to classes twice a week 4. Plan to check B/P 1-2 times a week at different times and write on log.  Plan to continue the Diabetes Prevention Program

## 2016-04-17 ENCOUNTER — Encounter: Payer: Self-pay | Admitting: Pulmonary Disease

## 2016-04-17 NOTE — Progress Notes (Signed)
Subjective:    Patient ID: Brenda Russo, female    DOB: 03/11/1955, 61 y.o.   MRN: [id number]  HPI 61 y/o BF, [employer] employee, followed for AR, Asthma, and r/o OSA; she now sees DrPaz for PCP w/ hx HBP, HL, overweight, neg thyroid eval, OA, VitD defic, anxiety; she also sees DrJacobs for GI... ~  SEE PREV EPIC NOTES FOR OLDER DATA >>    LABS 10/13:  FLP- not at goal on diet w/ LDL=131;  Chems- wnl;  CBC- wnl;  TSH=2.26;  VitD= 29...  LABS 5/14:  Chems- wnl;  CBC- wnl;  Ca125=11.9 (0-30); Sed=28...  CT Abd&Pelvis 6/14> tiny HH, sm fat containing inguinal hernias bilat, no adenopathy, no acute findings- int organs all ok...  Sleep Study 6/14 showed AHI=9, mod snoring, transient hypoxemia to 84%, occas PVCs, otherw neg...  ~  November 13, 2012:  54mo ROV & Brenda Russo had her sleep study & consult from DrClance- she is considering oral appliance vs CPAP trial while she tries to lose weight...     Her breathing is doing good she says; no resp exac, she has DOE w/ exercise- stable & asked to incr exercise program...    BP is fair on diet alone & measures 132/90 today; weight= 216# which is down 5# from prev; reminded to elim sodium...     Her prev GI symptoms resolved w/ Bentyl Rx & have not recurred, BMs are wnl she says...    She tells me that she saw DrCousins for Gyn & had a neg gyn eval & check up 6/14...    Vit D level is still sl low today at 29 despite 1000u OTC VitD supplement & she is asked to double it... We reviewed prob list, meds, xrays and labs> see below for updates >> she requested [facility] form completed to exempt from Flu shot claiming an allergy to Eggs & egg protein (severe SOB & wheezing).  LABS 10/14:  FLP- at goals on diet alone;  Chems- wnl;  CBC- wnl;  TSH=1.23;  VitD=29...  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  She notes that something similar happened last yr & she went to [facility]- ?Dx, given anti-inflamm pain med, symptoms eventually resolved; she notes that she is under stress caring for her elderly mother who had to move in w/ her...     After long discussion we decided to treat w/ rest, heat, Pred dosepak, MMW prn... I told her we may need to consider Klonopin Bid, or additional eval from ENT &/or GI...     Her Asthma is under good control w/o exac; her only prescription meds include AlbutHFA and NEB w/ Albut0.083% for prn use...    Her weight is the same (216# for BMI=36) and her  BP= 120/88 on diet alone; we reviewed low sodium weight reducing diet...  ~  September 03, 2014:  79mo [place] has been doing well- just notes incr allergy symptoms recently, using OTC meds which help;  She is working at [employer], 3rd shift & part time... We reviewed the following medical problems during today's office visit >>     R/O OSA> she notes snoring, tired all the time, & had Sleep Study w/ f/u DrClance- rec oral appliance vs CPAP, she didn't do either; she saw DrShowmaker for ENT as well...     AR/ Asthma> on VENTOLIN HFA prn (& refuses Proair- "it doesn't work for me"); no recent asthma attacks or exac...    HBP> prev on Amlodipine & Hyzaar; BP= 160/84 today but works 3rd shift, home BP checks all wnl she says & ave 120s/70s; she denies HAs, CP, palpit, ch in SOB, edema; she doesn't want  meds...    Chol> on diet alone; FLP 4/16 showed TChol 151, TG 49, HDL 51, LDL 90; she has refused med Rx butr improved on diet- we discussed a low chol/ low fat diet...    Thyroid/ Endocrine> she is s/p thorough endocrine eval by DrKerr 2/14- everything was normal, no endocrinopathy found; he rec sleep study & Psyche referral...    Overweight> wt is down to 215# - "I watch what I eat" and we discussed incr exercise...    GI- Abd pain 5/14=> likely IBS> treated w/ Bentyl & improved; last colonoscopy 7/10 by DrJacobs was neg, f/u planned 10 yrs..    DJD> on Mobic as needed    Anxiety> she was intol to Alprazolam; offered SSRI for chemical imbalance vs Psyche referral but she declines... EXAM reveals Afeb, BP=160/84, wt=215#, ht=5'5";  HEENT- neg, mallampati1;  Chest- clear w/o w/r/r;  Heart-  RR w/o m/r/g;  Abd- soft, neg;  Ext- neg w/o c/c/e...  LABS 08/2014>  Chems- ok x BS=112, A1c=6.0;  FLP- at goals;  VitD=49... IMP/PLAN>>  Brenda Russo is basically stable, continue same Rx...   ~  December 24, 2015:  74mo ROV & add-on appt requested for recent asthma flair> Brenda Russo presents w/ cough, chest congestion & heaviness, she denies sput/ hemoptysis, notes mild wheezing and SOB; she is concerned about chest pressure/ heaviness- esp when supine & says cough is worse supine but she denies GERD, reflux, dysphagia, etc (she tells me that she saw a "hollistic lady" for massage & was told she has a hernia)... She went to an Urgent Care recently & was placed on Augmentin & Prednisone, congestion persists and she's concerned because she is going OOT in early Dec...     R/O OSA> hx of snoring, tired all the time, & had Sleep Study (AHI=9) w/ f/u DrClance 2014- rec oral appliance vs CPAP, she didn't do either; she saw DrShoemaker for ENT as well...     AR/ Asthma> on VENTOLIN HFA (using it 2-3x/d recently- (note: refuses Proair- "it doesn't work for me"); has NEB but not using, supposed to be on Qvar80 but didn't refill; we  checked CXR & Spirometry- see below>    Medical issues> per DrPaz etal- HBP (Hyzaar & Amlod5), HL (FishOil & CoQ10), overwt, ?thyroid, IBS, OA (OTC rx), VitD defic (supplement daily), anxiety... EXAM reveals Afeb, BP=114/82, O2sat=98% on RA, wt=210#, ht=5'5";  HEENT- neg, mallampati1;  Chest- few bibasilar rhonchi w/o wheezing, rales, consolidation;  Heart-  RR w/o m/r/g;  Abd- soft, neg;  Ext- neg w/o c/c/e...  CXR11/16/17 (  independently reviewed by me in the PACS system)>  Norm heart size, clear lungs, NAD...  Spirometry 12/24/15>  FVC=2.75 (98%), FEV1=1.68 (76%), %1sec=61, mid-flows reduced at 39% predicted; I explained to her that this represents MOD airflow obstruction & she needs to stay on a controller med! IMP/PLAN>  Brenda Russo has asthma & I have been concerned about a fixed obstructive component (only old PFTs in her remote paper chart); she has declined to take an ICS/LABA regularly; we dicussed this again today & I have rec BREO- one puff daily, continue the VentolinHFA prn, plus a vigorous antireflux regimen w/ Protonix40 before dinner, NPO after dinner, elev HOB 6"... we plan ROV recheck in 73mo.   ~  April 13, 2016:  54mo ROV & Brenda Russo returns w/ concern about her heart rate, says it seems to be running lower at home & counted at 37 recently & usually in the 50-70 range; BP has been good (110-120/ 60-70s) she says & she is essentially asymptomatic- w/o CP, palpit, dizzy, SOB, edema; she worries due to a family hx of heart dis- a cousin recently had a cath;  Her EKG today showed PVCs & I explained how PVCs often aren't counted on the BP monitor of pulse rate because they come in early and don't create an impulse, then followed by the compensatory pause & the counted rate is lower that the actual rate w/ the PVCs included (as on the EKG);  She understands this but is anxious & we discussed CARDS eval...  We reviewed the following medical problems during today's office visit >>     R/O OSA> hx of  snoring, tired all the time, & had Sleep Study (AHI=9) w/ f/u DrClance 2014- rec oral appliance vs CPAP, she didn't do either; she saw DrShoemaker for ENT as well...     AR/ Asthma> on BREO once daily + VENTOLIN HFA (using it occas)- (note: refuses Proair- "it doesn't work for me"); has NEB but not using; CXR & Spirometry done 11/17- see above...    Cardiac issue>  She has unifocal fixed coupled PVCs on her EKG & is concerned about her low heart rate measured on her BP monitor; we discussed this & decided to refer her to CARDS for additional eval, prob needs HOLTER to r/o other arrhythmias and to quantify the PVC burden...     Medical issues> per DrPaz etal- HBP (Hyzaar & Amlod5), HL (FishOil & CoQ10), overwt, ?thyroid, IBS, OA (OTC rx), VitD defic (supplement daily), anxiety... EXAM reveals Afeb, BP=118/60 & pulse=43 w/ PVCs, O2sat=97% on RA, wt=203#, ht=5'5";  HEENT- neg, mallampati1;  Chest- few bibasilar rhonchi w/o wheezing, rales, consolidation;  Heart-  irreg w/ PBs w/o m/r/g;  Abd- soft, neg;  Ext- neg w/o c/c/e...  EKG 04/13/16 showed SBrady, rate56 w/ PVCs noted, no STTW abnormalities detected... IMP/PLAN>>  Brenda Russo's slow heart rate as detected on her BP monitor is explained by the PVCs; she is reassured & asked to check her pulse rate by carotid impulse or listening to her heart; she is quite concerned for the irregularity & we will refer to CARDS for further eval & to consider Holter top r/o other arrhythmias & check the PVC burden to consider therapy... She is advised to decr her Hyzaar & Amlod to 1/2 of each tab daily & f/u w/ her PCP (DrPaz in 43month...          Problem List:     ALLERGIC RHINITIS (ICD-477.9) - she uses OTC meds as needed.  MILD  OSA >>  ~  Sleep Study 6/14 showed AHI=9, mod snoring, transient hypoxemia to 84%, occas PVCs, otherw neg... ~  Sleep consult DrClance 9/14> awakes tired, not rested, & ESS=18; the events were noted to occur mostly during REM; Rec to work on wt  reduction & consider dental appliance vs CPAP...   ASTHMA (ICD-493.90) - only using Ventolin HFA as needed (she has NEB w/ Albuterol as well)... she hasn't been checking peak flows etc... she notes that she averages one inhalation daily- usually in the AM... she understands that she is not on a controller drug, but she does not wish to change to Symbicort, Advair, Pulmicort, etc (despite my recommendation to start and stay on one of these medications)...  ~  CXR 10/12 showed norm heart size, clear lungs x mild bilat apical pleural scarring, NAD...   HYPERTENSION (ICD-401.9) - prev on Norvasc & Hyzaar w/ good control of BP, she stopped on her own 4/13... ~  1/12:  BP= 140/94 on Hyzaar100/12.5 & we decided to add NORVASC 5mg /d... ~  5/12:  Pt reports that she decr the Amlodipine 5mg  to1/2 tab daily & BP checks at home have been good (110/80 here today). ~  EKG 6/12 showed SBrady, rate50, wnl, NAD... ~  10/12:  Pt reports that she decr both meds to 1/2tab daily due to dizziness & improved... ~  4/13:  She tells me she STOPPED both BP meds 3wks ago when she changed jobs at [employer] now on 3rd shift job; states she feels better off the meds; BP= 142/90 & asked to keep log of BP reading for [name] to review together in f/u appt; reviewed lifestyle mod, no salt, wt reduction, etc; a nurse friend suggested that she check her Cortisol level (it was normal). ~  7/13:  BP= 150/100 but she states all BP checks at home have been good; blames it on night shft job & trying to go to day job, remionded to elim salt & get wt down! ~  10/13:  BP= 144/92 & she is fatigued; denies CP, palpit, SOB, edema... ~  10/14: BP is fair on diet alone & measures 132/90 today; weight= 216# which is down 5# from prev; reminded to elim sodium...  PVCs >> detected 04/2016 by her hx and on EKG => referred to CARDS for further eval...  HYPERCHOLESTEROLEMIA, BORDERLINE (ICD-272.4) - on diet Rx alone... She refuses med rx for her lipids. ~  FLP  12/08 showed TChol 178, TG 35, HDL 58, LDL 113 ~  FLP 11/10 showed TChol 168, TG 45, HDL 57, LDL 102 ~  FLP 10/11 showed TChol 185, TG 49, HDL 57, LDL 118 ~  FLP 10/12 showed TChol 164, TG 30, HDL 59, LDL 99... Great, keep up the good work! ~  [place] 10/13 showed TChol 192, TG 46, HDL 52, LDL 136... She refuses meds, offered Lipid clinic. ~  [place] 10/14 on diet alone showed TChol 166, TG 37, HDL 56, LDL 102  OVERWEIGHT (ICD-278.02) - we discussed diet + exercise program... ~  weight 1/07 = 181# ~  weight 6/09 = 206# ~  weight 11/10 = 213# ~  weight 10/11 = 213# ~  Weight 5/12 = 219#... Despite numerous diet attempts & fads. ~  Weight 10/12 = 212# ~  Weight 4/13 = 216#... We reviewed low carb, low fat diet. ~  Weight 7/13 = 219# ~  Weight 10/13 = 225# ~  Weight 5/14 = 222# ~  Weight 10/14 = 216#  THYROID >> She presented 10/13 c/o being fatigued, cold, etc & she read on the internet that this could be hypothyroidism; I explained to her that her thyroid function as been normal (normal TSH) & this rules out hypothy, but she rejects this, says she read on the internet from "DrWiggy" that she needs a ReverseT3 checked but I declined to order this test;  She wants to have an Endocrine consult regarding her thyroid & we will refer per her request... ~  Labs 10/13 showed TSH= 2.26; Thyroid exam is neg- no goiter, no nodules; she started taking Iodine on her own & I advised her to stop... ~  1-2/14:  She had Endocrine eval by DrKerr> notes reviewed- no evid for any endocrinopathy (thyroid, adrenal, pituitary); she is c/o fatigue, depressed mood, difficulty concentrating, impaired memory, etc; also noted snoring & wanted her to get a sleep study & consider antidepressant meds vs Psyche referral... (Sleep study w/ AHI=9 & she is considering Rx w/ oral appliance vs CPAP while trying to lose the weight). ~  Labs 10/14 showed TSH= 1.23  GI >> Routine colonoscopy 7/10 by DrJacobs done for rectal bleeding; Colon  was normal/ negative/ no abnormalities seen & repeat rec in 42yrs...  DEGENERATIVE ARTHRITIS>  She has DJD/ OA & uses OTC anti-inflamm meds; prev tried Mobic 7.5mg  Prn... ~  Labs 10/14 showed VitD level = 29 and she is encouraged to incr the VitD supplement to 2000u daily.  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 ~  5/14:  We reviewed DrKerr's assessment & offered meds for depression but she declines, offered Psyche referral for "chemical imbalance" but she declines...  GENERAL HEALTH:  she sees DrCousins for GYN & she is due PAP, Mammograms, etc... had colonoscopy 7/10 by DrJacobs= neg, no polyps etc... f/u plannned 47yrs.  Past Medical History:  Diagnosis Date  . Allergic rhinitis   . Anxiety   . Asthma   . HTN (hypertension)   . Hyperlipidemia    borderline  . OSA (obstructive sleep apnea)    mild ,no CPAP  . Overweight(278.02)   . Plantar wart    history    Past Surgical History:  Procedure Laterality Date  . BREAST BIOPSY Right 09/02/2015  . CESAREAN SECTION    . TUBAL LIGATION      Outpatient Encounter Prescriptions as of 12/24/2015  Medication Sig Dispense Refill  . albuterol (PROVENTIL) (2.5 MG/3ML) 0.083% nebulizer solution Take 3 mLs (2.5 mg total) by nebulization 3 (three) times daily as needed. 75 mL 6  . albuterol (VENTOLIN HFA) 108 (90 Base) MCG/ACT inhaler Inhale 1-2 puffs into the lungs every 4 (four) hours as needed for wheezing. 18 g 5  . Ascorbic Acid (VITAMIN C) 1000 MG tablet Take 1,000 mg by mouth daily.    . B Complex-C-Folic Acid (B-COMPLEX BALANCED PO) Take by mouth.    . beclomethasone (QVAR) 80 MCG/ACT inhaler Inhale 2 puffs into the lungs daily      (not using regularly) 1 Inhaler 12  . Cholecalciferol (VITAMIN D3) 5000 UNITS CAPS Take 1 capsule by mouth daily.    . Coenzyme Q10 (COQ10) 100 MG CAPS Take 1 capsule by mouth daily.    . magnesium  oxide (MAG-OX) 400 MG tablet 2 tablets daily. 400-800 mg daily    . Multiple Minerals-Vitamins (CALCIUM CITRATE PLUS/MAGNESIUM) TABS Take 1 tablet by mouth.    . Multiple  Vitamins-Minerals (COMPLETE WOMENS PO) Take 1 capsule by mouth 2 (two) times daily.      . Omega-3 Fatty Acids (FISH OIL) 1000 MG CAPS Take 1 capsule by mouth.    Marland Kitchen  amLODipine (NORVASC) 5 MG tablet Take 1 tablet (5 mg total) by mouth daily. 30 tablet 5  .  losartan-hydrochlorothiazide (HYZAAR) 100-12.5 MG tablet Take 1 tablet by mouth daily. (Patient taking differently: Take 1 tablet by mouth as needed. ) 30 tablet 5  . [DISCONTINUED] amoxicillin-clavulanate (AUGMENTIN) 875-125 MG tablet Take 1 tablet by mouth 2 (two) times daily. (Patient not taking: Reported on 12/24/2015) 20 tablet 0  . [DISCONTINUED] predniSONE (DELTASONE) 20 MG tablet Take 3 PO QAM x 1 day, 2 PO QAM x 5 days, 1 PO QAM x 5 days (Patient not taking: Reported on 12/24/2015) 18 tablet 0    Allergies  Allergen Reactions  . Eggs Or Egg-Derived Products     Shortness of breath, wheezing    Current Medications, Allergies, Past Medical History, Past Surgical History, Family History, and Social History were reviewed in [employer] record.    Review of Systems      See HPI - all other systems neg except as noted...  The patient denies anorexia, fever, weight loss, weight gain, vision loss, decreased hearing, hoarseness, chest pain, syncope, dyspnea on exertion, peripheral edema, prolonged cough, headaches, hemoptysis, abdominal pain, melena, hematochezia, severe indigestion/heartburn, hematuria, incontinence, muscle weakness, suspicious skin lesions, transient blindness, difficulty walking, depression, unusual weight change, abnormal bleeding, enlarged lymph nodes, and angioedema.   Objective:   Physical Exam     WD, WN, 61 y/o BF in NAD... GENERAL:  Alert & oriented; pleasant & cooperative... HEENT:  [place]/AT, EOM-wnl, PERRLA,  EACs-clear, TMs-wnl, NOSE-clear, THROAT-clear & wnl. NECK:  Supple w/ full ROM; no JVD; normal carotid impulses w/o bruits; no thyromegaly or nodules palpated; no lymphadenopathy. CHEST:  Few bibasilar rhonchi; without wheezes or rales; no signs of consolidation. HEART:  Regular Rhythm; without murmurs/ rubs/ or gallops detected... ABDOMEN:  Soft & nontender; normal bowel sounds; no organomegaly or masses palpated... EXT: without deformities or arthritic changes; no varicose veins/ +venous insuffic/ no edema. NEURO:  CN's intact; motor testing normal; sensory testing normal; gait normal & balance OK. DERM:  No lesions noted; no rash etc...  RADIOLOGY DATA:  Reviewed in the EPIC EMR & discussed w/ the patient...  LABORATORY DATA:  Reviewed in the EPIC EMR & discussed w/ the patient...   Assessment & Plan:    04/13/16 >> Brenda Russo's slow heart rate as detected on her BP monitor is explained by the PVCs; she is reassured & asked to check her pulse rate by carotid impulse or listening to her heart; she is quite concerned for the irregularity & we will refer to CARDS for further eval & to consider Holter top r/o other arrhythmias & check the PVC burden to consider therapy... She is advised to decr her [place] to 1/2 of each tab & follow up w/ DrPaz (her PCP ) in 77mo...   Mild OSA>  She declined options for oral appliance vs CPAP; she is trying to lose weight...  ASTHMA>  Not under good control, she needs a "controller med" & we discussed BREO one puff daily & continue the Ventolin vs NEB prn...  HBP>  She clearly does not want meds! Prev stopped both BP meds on her own when she went to 3rd shift job; we reviewed lifestyle mod, no salt, diet efforts &  encouraged wt reduction; she will monitor BP & keep a log for DrPaz to review... Advised that she MUST lose wt & elim salt to keep her BP down...  Hx CAROTIDYNIA>> Her prev hx and exam were c/w this diagnosis & treated w/ rest/ heat/ Pred dosepak; I  indicated that she may need Klonopin trial as well...  CHOL>  on diet alone w/ FLP improvedl;  Needs better diet & get wt down!!!  OVERWEIGHT>  Wt reduction is key to her BP/ DM/ etc...  GI- HxGERD, LLQ pain>  She is currently denying GERD, reflux symptoms, etc;  CT Abd 6/14 was essent neg=> symptoms resolved w/ Bentyl Rx...  DJD>  She uses OTC analgesics as needed; offered Ortho consult when ready...  ANXIETY>  She does not want med Rx or Psyche referral...   Patient's Medications  New Prescriptions   No medications on file  Previous Medications   ALBUTEROL (PROVENTIL) (2.5 MG/3ML) 0.083% NEBULIZER SOLUTION    Take 3 mLs (2.5 mg total) by nebulization 3 (three) times daily as needed.   ALBUTEROL (VENTOLIN HFA) 108 (90 BASE) MCG/ACT INHALER    Inhale 1-2 puffs into the lungs every 4 (four) hours as needed for wheezing.   AMLODIPINE (NORVASC) 5 MG TABLET    Take 1 tablet (5 mg total) by mouth daily.   ASCORBIC ACID (VITAMIN C) 1000 MG TABLET    Take 1,000 mg by mouth daily.   B COMPLEX-C-FOLIC ACID (B-COMPLEX BALANCED PO)    Take by mouth.   CHOLECALCIFEROL (VITAMIN D3) 5000 UNITS CAPS    Take 1 capsule by mouth daily.   COENZYME Q10 (COQ10) 100 MG CAPS    Take 1 capsule by mouth daily.   FLUTICASONE FUROATE-VILANTEROL (BREO ELLIPTA) 200-25 MCG/INH AEPB    Inhale 1 puff into the lungs daily.   LOSARTAN-HYDROCHLOROTHIAZIDE (HYZAAR) 100-12.5 MG TABLET    Take 1 tablet by mouth daily.   MAGNESIUM OXIDE (MAG-OX) 400 MG TABLET    2 tablets daily. 400-800 mg daily   MULTIPLE MINERALS-VITAMINS (CALCIUM CITRATE PLUS/MAGNESIUM) TABS    Take 1 tablet by mouth.   MULTIPLE VITAMINS-MINERALS (COMPLETE WOMENS PO)    Take 1 capsule by mouth 2 (two) times daily.     OMEGA-3 FATTY ACIDS (FISH OIL) 1000 MG CAPS    Take 1 capsule by mouth.   PANTOPRAZOLE (PROTONIX) 40 MG TABLET    Take 1 tablet (40 mg total) by mouth daily. 30 minutes before the last meal of the day  Modified Medications   No medications  on file  Discontinued Medications   BECLOMETHASONE (QVAR) 80 MCG/ACT INHALER    Inhale 2 puffs into the lungs daily.   FLUTICASONE FUROATE-VILANTEROL (BREO ELLIPTA) 200-25 MCG/INH AEPB    Inhale 1 puff into the lungs daily.

## 2016-04-26 ENCOUNTER — Encounter: Payer: Self-pay | Admitting: Internal Medicine

## 2016-04-28 ENCOUNTER — Encounter (INDEPENDENT_AMBULATORY_CARE_PROVIDER_SITE_OTHER): Payer: 59 | Admitting: Family Medicine

## 2016-05-05 ENCOUNTER — Ambulatory Visit (INDEPENDENT_AMBULATORY_CARE_PROVIDER_SITE_OTHER): Payer: 59 | Admitting: Internal Medicine

## 2016-05-05 VITALS — BP 154/100 | HR 55 | Ht 65.0 in | Wt 205.0 lb

## 2016-05-05 DIAGNOSIS — I493 Ventricular premature depolarization: Secondary | ICD-10-CM | POA: Diagnosis not present

## 2016-05-05 DIAGNOSIS — R001 Bradycardia, unspecified: Secondary | ICD-10-CM | POA: Diagnosis not present

## 2016-05-05 NOTE — Patient Instructions (Signed)
Medication Instructions: - Your physician recommends that you continue on your current medications as directed. Please refer to the Current Medication list given to you today.  Labwork: - none ordered  Procedures/Testing: - none ordered  Follow-Up: - Dr. Klein will see you back on an as needed basis.  Any Additional Special Instructions Will Be Listed Below (If Applicable).     If you need a refill on your cardiac medications before your next appointment, please call your pharmacy.   

## 2016-05-05 NOTE — Progress Notes (Addendum)
ELECTROPHYSIOLOGY CONSULT NOTE  Patient ID: Brenda Russo, MRN: [id number], DOB/AGE: 05-02-1955 61 y.o. Admit date: (Not on file) Date of Consult: 05/05/2016  Primary Physician: Kathlene November, MD Primary Cardiologist: new Consulting Physician Dr Larose Kells  Chief Complaint: bradycardia and PVCs   HPI Brenda Russo is a 61 y.o. female  With a long-standing history of bradycardia. She recently saw her primary care physician who noted issues of bradycardia now also accompanied by PVCs.  She has a long-standing history of hypersomnolence and fatigue. She been evaluated in the past for sleep apnea. This was an unimpressively positive.  She has modest fatigue although when she tries to do anything she has no limitations.  She's been noted over the years to have some degree of bradycardia first noted 6/12  heart rates have ranged as low as 52 through 2016; more recently in March they have been noted in the 40s  however, ECG review was demonstrated PVCs which may be artifactually lowering her recorded pulse  She has had no syncope  Past Medical History:  Diagnosis Date  . Allergic rhinitis   . Anxiety   . Asthma   . HTN (hypertension)   . Hyperlipidemia    borderline  . OSA (obstructive sleep apnea)    mild ,no CPAP  . Overweight(278.02)   . Plantar wart    history      Surgical History:  Past Surgical History:  Procedure Laterality Date  . BREAST BIOPSY Right 09/02/2015  . CESAREAN SECTION    . TUBAL LIGATION       Home Meds: Prior to Admission medications   Medication Sig Start Date End Date Taking? Authorizing Provider  albuterol (PROVENTIL) (2.5 MG/3ML) 0.083% nebulizer solution Take 3 mLs (2.5 mg total) by nebulization 3 (three) times daily as needed. 05/20/15   Colon Branch, MD  albuterol (VENTOLIN HFA) 108 (90 Base) MCG/ACT inhaler Inhale 1-2 puffs into the lungs every 4 (four) hours as needed for wheezing. 12/24/15   Noralee Space, MD  amLODipine (NORVASC) 5 MG  tablet Take 1 tablet (5 mg total) by mouth daily. Patient taking differently: Take 2.5 mg by mouth daily.  12/24/15   Colon Branch, MD  Ascorbic Acid (VITAMIN C) 1000 MG tablet Take 1,000 mg by mouth daily.    Historical Provider, MD  B Complex-C-Folic Acid (B-COMPLEX BALANCED PO) Take by mouth.    Historical Provider, MD  Cholecalciferol (VITAMIN D3) 5000 UNITS CAPS Take 1 capsule by mouth daily.    Historical Provider, MD  Coenzyme Q10 (COQ10) 100 MG CAPS Take 1 capsule by mouth daily.    Historical Provider, MD  fluticasone furoate-vilanterol (BREO ELLIPTA) 200-25 MCG/INH AEPB Inhale 1 puff into the lungs daily. 12/24/15   Noralee Space, MD  losartan-hydrochlorothiazide (HYZAAR) 100-12.5 MG tablet Take 1 tablet by mouth daily. Patient taking differently: Take 0.5 tablets by mouth daily.  12/24/15   Colon Branch, MD  magnesium oxide (MAG-OX) 400 MG tablet 2 tablets daily. 400-800 mg daily    Historical Provider, MD  Multiple Minerals-Vitamins (CALCIUM CITRATE PLUS/MAGNESIUM) TABS Take 1 tablet by mouth.    Historical Provider, MD  Multiple Vitamins-Minerals (COMPLETE WOMENS PO) Take 1 capsule by mouth 2 (two) times daily.      Historical Provider, MD  Omega-3 Fatty Acids (FISH OIL) 1000 MG CAPS Take 1 capsule by mouth.    Historical Provider, MD  pantoprazole (PROTONIX) 40 MG tablet Take 1 tablet (40 mg total)  by mouth daily. 30 minutes before the last meal of the day 12/24/15   Noralee Space, MD    Allergies:  Allergies  Allergen Reactions  . Eggs Or Egg-Derived Products     Shortness of breath, wheezing    Social History   Social History  . Marital status: Married    Spouse name: N/A  . Number of children: 3  . Years of education: N/A   Occupational History  . 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 Smoking status: Never Smoker  . Smokeless tobacco: Never Used  . Alcohol use No  . Drug use: No  . Sexual activity: Not Currently   Other Topics  Concern  . Not on file   Social History Narrative   3 biological children   1 adopted child   3 step-children   Household-- pt , husband and a daughter            Family History  Problem Relation Age of Onset  . Heart disease Mother     pacemaker   . Hypertension Mother   . Hyperlipidemia Mother   . Asthma Father   . Pancreatic cancer Brother   . Breast cancer Maternal Aunt   . Colon cancer Neg Hx      ROS:  Please see the history of present illness.     All other systems reviewed and negative.    Physical Exam:  Blood pressure (!) 154/100, pulse (!) 55, height 5\' 5"  (1.651 m), weight 205 lb (93 kg), SpO2 94 %. General: Well developed, well nourished female in no acute distress. Head: Normocephalic, atraumatic, sclera non-icteric, no xanthomas, nares are without discharge. EENT: normal  Lymph Nodes:  none Neck: Negative for carotid bruits. JVD not elevated. Back:without scoliosis kyphosis Lungs: Clear bilaterally to auscultation without wheezes, rales, or rhonchi. Breathing is unlabored. Heart: RRR with S1 S2. No  murmur . No rubs, or gallops appreciated. Abdomen: Soft, non-tender, non-distended with normoactive bowel sounds. No hepatomegaly. No rebound/guarding. No obvious abdominal masses. Msk:  Strength and tone appear normal for age. Extremities: No clubbing or cyanosis. No edema.  Distal pedal pulses are 2+ and equal bilaterally. Skin: Warm and Dry Neuro: Alert and oriented X 3. CN III-XII intact Grossly normal sensory and motor function . Psych:  Responds to questions appropriately with a normal affect.      Labs: Cardiac Enzymes No results for input(s): CKTOTAL, CKMB, TROPONINI in the last 72 hours. CBC Lab Results  Component Value Date   WBC 6.5 11/13/2012   HGB 14.3 11/13/2012   HCT 42.7 11/13/2012   MCV 84.8 11/13/2012   PLT 268.0 11/13/2012   PROTIME: No results for input(s): LABPROT, INR in the last 72 hours. Chemistry No results for input(s):  NA, K, CL, CO2, BUN, CREATININE, CALCIUM, PROT, BILITOT, ALKPHOS, ALT, AST, GLUCOSE in the last 168 hours.  Invalid input(s): LABALBU Lipids Lab Results  Component Value Date   CHOL 168 06/03/2015   HDL 56.30 06/03/2015   LDLCALC 103 (H) 06/03/2015   TRIG 45.0 06/03/2015   BNP No results found for: PROBNP Thyroid Function Tests: No results for input(s): TSH, T4TOTAL, T3FREE, THYROIDAB in the last 72 hours.  Invalid input(s): FREET3 Miscellaneous No results found for: DDIMER  Radiology/Studies:  No results found.  EKG:  Sinus rhythm at 55 Intervals 16/07/44 Infrequent PVCs with an inferior axis morphology   Assessment and Plan:  Sinus bradycardia-chronic  PVCs-  infrequent probably RVOT  Hypersomnolence   I do not think her bradycardia is related to her fatigue and somnolence. It is chronic.    Her PVCs are relatively infrequent by monitoring today at least. She is unaware of them. In the absence of symptoms or impact on LV function no specific therapy is indicated.  I do wonder whether repeat sleep study may not be of benefit.  We will see her again as needed  Virl Axe

## 2016-05-11 ENCOUNTER — Encounter: Payer: Self-pay | Admitting: Internal Medicine

## 2016-05-17 ENCOUNTER — Ambulatory Visit (INDEPENDENT_AMBULATORY_CARE_PROVIDER_SITE_OTHER): Payer: 59 | Admitting: Family Medicine

## 2016-05-17 ENCOUNTER — Encounter (INDEPENDENT_AMBULATORY_CARE_PROVIDER_SITE_OTHER): Payer: Self-pay | Admitting: Family Medicine

## 2016-05-17 VITALS — BP 129/63 | HR 57 | Temp 98.0°F | Resp 14 | Ht 65.0 in | Wt 200.0 lb

## 2016-05-17 DIAGNOSIS — E669 Obesity, unspecified: Secondary | ICD-10-CM

## 2016-05-17 DIAGNOSIS — R5383 Other fatigue: Secondary | ICD-10-CM | POA: Diagnosis not present

## 2016-05-17 DIAGNOSIS — R739 Hyperglycemia, unspecified: Secondary | ICD-10-CM

## 2016-05-17 DIAGNOSIS — R0602 Shortness of breath: Secondary | ICD-10-CM

## 2016-05-17 DIAGNOSIS — Z9189 Other specified personal risk factors, not elsewhere classified: Secondary | ICD-10-CM | POA: Diagnosis not present

## 2016-05-17 DIAGNOSIS — J3089 Other allergic rhinitis: Secondary | ICD-10-CM

## 2016-05-17 DIAGNOSIS — Z1389 Encounter for screening for other disorder: Secondary | ICD-10-CM

## 2016-05-17 DIAGNOSIS — E559 Vitamin D deficiency, unspecified: Secondary | ICD-10-CM

## 2016-05-17 DIAGNOSIS — Z6833 Body mass index (BMI) 33.0-33.9, adult: Secondary | ICD-10-CM | POA: Diagnosis not present

## 2016-05-17 DIAGNOSIS — J45909 Unspecified asthma, uncomplicated: Secondary | ICD-10-CM

## 2016-05-17 DIAGNOSIS — Z1331 Encounter for screening for depression: Secondary | ICD-10-CM

## 2016-05-17 DIAGNOSIS — I10 Essential (primary) hypertension: Secondary | ICD-10-CM | POA: Diagnosis not present

## 2016-05-17 DIAGNOSIS — Z0289 Encounter for other administrative examinations: Secondary | ICD-10-CM

## 2016-05-17 MED ORDER — FLUTICASONE FUROATE-VILANTEROL 200-25 MCG/INH IN AEPB: 2.0000 | INHALATION_SPRAY | Freq: Every day | RESPIRATORY_TRACT | 0 refills | Status: AC

## 2016-05-17 NOTE — Progress Notes (Signed)
Office: [phone]  /  Fax: [phone]   HPI:   Chief Complaint: OBESITY  Brenda Russo (MR# [id number]) is a 61 y.o. female who presents on 05/17/2016 for obesity evaluation and treatment. Current BMI is Body mass index is 33.28 kg/m.Marland Kitchen Brenda Russo has struggled with obesity for years and has been unsuccessful in either losing weight or maintaining long term weight loss. Brenda Russo attended our information session and states she is currently in the action stage of change and ready to dedicate time achieving and maintaining a healthier weight.  Brenda Russo states her family eats meals together she thinks her family will eat healthier with  her her desired weight loss is 55 to 60 lbs she started gaining weight after going back to school her heaviest weight ever was 225 lbs. she has significant food cravings issues  she snacks frequently in the evenings she skips meals frequently she frequently makes poor food choices she frequently eats larger portions than normal  she has binge eating behaviors she struggles with emotional eating    Fatigue Brenda Russo feels her energy is lower than it should be. This has worsened with weight gain and has not worsened recently. Brenda Russo admits to daytime somnolence and  admits to waking up still tired. Patient is at risk for obstructive sleep apnea. Patent has a history of symptoms of daytime fatigue, Epworth sleepiness scale, morning headache and hypertension. Patient generally gets 5 or 6 hours of sleep per night, and states they generally have generally restful sleep. Snoring is present. Apneic episodes are present. Epworth Sleepiness Score is 15  Dyspnea on exertion Brenda Russo notes increasing shortness of breath with exercising and seems to be worsening over time with weight gain. She notes getting out of breath sooner with activity than she used to. This has not gotten worse recently. Brenda Russo denies orthopnea.  Hyperglycemia Brenda Russo has a history of  some elevated blood glucose readings without a diagnosis of diabetes. She denies polyphagia. She is attempting to improve with diet. She is not currently on Metformin.  Vitamin D deficiency Brenda Russo has a diagnosis of vitamin D deficiency. She is currently taking OTC vit D, no recent labs. She admits fatigue and denies nausea, vomiting or muscle weakness.   Hypertension Brenda Russo is a 61 y.o. female with hypertension.  Brenda Russo denies headache or chest pain.. She is working weight loss to help control her blood pressure with the goal of decreasing her risk of heart attack and stroke. Brenda Russo blood pressure is currently controlled today. She is not on medications currently.  Asthma Mild Persistent Brenda Russo is on Albuterol, no exacerbations > 10 years ago but has had 2 intubations, is on Breo "most days" and is controlled.  Allergic Rhinitis Brenda Russo notes rhinorrhea, worse in the last 2 to 3 weeks, also irritated conjunctiva. She is not on allergy medications currently.   At risk for diabetes Brenda Russo is at higher than average risk for developing diabetes due to her obesity and hyperglycemia. She currently denies polyuria or polydipsia.  Depression Screen Brenda Russo Food and Mood (modified PHQ-9) score was  Depression screen PHQ 2/9 05/17/2016  Decreased Interest 0  Down, Depressed, Hopeless 1  PHQ - 2 Score 1  Altered sleeping 2  Tired, decreased energy 1  Change in appetite 2  Feeling bad or failure about yourself  0  Trouble concentrating 2  Moving slowly or fidgety/restless 0  Suicidal thoughts 0  PHQ-9 Score 8    ALLERGIES: Allergies  Allergen Reactions  . Eggs  Or Egg-Derived Products     Shortness of breath, wheezing    MEDICATIONS: Current Outpatient Prescriptions on File Prior to Visit  Medication Sig Dispense Refill  . albuterol (PROVENTIL) (2.5 MG/3ML) 0.083% nebulizer solution Take 3 mLs (2.5 mg total) by nebulization 3 (three) times daily as  needed. 75 mL 6  . albuterol (VENTOLIN HFA) 108 (90 Base) MCG/ACT inhaler Inhale 1-2 puffs into the lungs every 4 (four) hours as needed for wheezing. 18 g 5  . amLODipine (NORVASC) 5 MG tablet Take 1 tablet (5 mg total) by mouth daily. (Patient taking differently: Take 2.5 mg by mouth daily. ) 30 tablet 5  . Ascorbic Acid (VITAMIN C) 1000 MG tablet Take 1,000 mg by mouth daily.    . B Complex-C-Folic Acid (B-COMPLEX BALANCED PO) Take by mouth.    . Cholecalciferol (VITAMIN D3) 5000 UNITS CAPS Take 1 capsule by mouth daily.    . Coenzyme Q10 (COQ10) 100 MG CAPS Take 1 capsule by mouth daily.    Marland Kitchen losartan-hydrochlorothiazide (HYZAAR) 100-12.5 MG tablet Take 1 tablet by mouth daily. (Patient taking differently: Take 0.5 tablets by mouth daily. ) 30 tablet 5  . magnesium oxide (MAG-OX) 400 MG tablet 2 tablets daily. 400-800 mg daily    . Multiple Minerals-Vitamins (CALCIUM CITRATE PLUS/MAGNESIUM) TABS Take 1 tablet by mouth.    . Multiple Vitamins-Minerals (COMPLETE WOMENS PO) Take 1 capsule by mouth 2 (two) times daily.      . Omega-3 Fatty Acids (FISH OIL) 1000 MG CAPS Take 1 capsule by mouth.    . pantoprazole (PROTONIX) 40 MG tablet Take 1 tablet (40 mg total) by mouth daily. 30 minutes before the last meal of the day 90 tablet 3   No current facility-administered medications on file prior to visit.     PAST MEDICAL HISTORY: Past Medical History:  Diagnosis Date  . Allergic rhinitis   . Anemia   . Anxiety   . Asthma   . HTN (hypertension)   . Hyperlipidemia    borderline  . Obese   . OSA (obstructive sleep apnea)    mild ,no CPAP  . Overweight(278.02)   . Plantar wart    history  . Prediabetes   . Shortness of breath     PAST SURGICAL HISTORY: Past Surgical History:  Procedure Laterality Date  . BREAST BIOPSY Right 09/02/2015  . CESAREAN SECTION    . TUBAL LIGATION      SOCIAL HISTORY: Social History  Substance Use Topics  . Smoking status: Never Smoker  . Smokeless  tobacco: Never Used  . Alcohol use No    FAMILY HISTORY: Family History  Problem Relation Age of Onset  . Heart disease Mother     pacemaker   . Hypertension Mother   . Hyperlipidemia Mother   . Obesity Mother   . Asthma Father   . Pancreatic cancer Brother   . Breast cancer Maternal Aunt   . Colon cancer Neg Hx     ROS: Review of Systems  Constitutional: Positive for malaise/fatigue.  HENT: Positive for congestion (nasal stuffiness).        Rhinorrhea  Eyes:       Wear Glasses or Contacts Irritated conjunctiva  Respiratory: Positive for cough, shortness of breath and wheezing.        Shortness of breath with activity  Cardiovascular: Negative for orthopnea.  Gastrointestinal: Negative for nausea and vomiting.  Genitourinary: Negative for frequency.  Musculoskeletal:       Neck Stiffness  Negative muscle weakness  Skin: Positive for rash.       dryness  Neurological: Positive for headaches.  Endo/Heme/Allergies: Negative for polydipsia.       Hay Fever Cold intolerance Negative polyphagia  Psychiatric/Behavioral:       Stress    PHYSICAL EXAM: Blood pressure 129/63, pulse (!) 57, temperature 98 F (36.7 C), resp. rate 14, height 5\' 5"  (1.651 m), weight 200 lb (90.7 kg), SpO2 95 %. Body mass index is 33.28 kg/m. Physical Exam  Constitutional: She is oriented to person, place, and time. She appears well-developed and well-nourished.  Cardiovascular: Normal rate.   Musculoskeletal: Normal range of motion.  Neurological: She is oriented to person, place, and time.  Skin: Skin is warm and dry.  Psychiatric: She has a normal mood and affect. Her behavior is normal.  Vitals reviewed.   RECENT LABS AND TESTS: BMET    Component Value Date/Time   NA 140 06/03/2015 0747   K 3.8 06/03/2015 0747   CL 103 06/03/2015 0747   CO2 30 06/03/2015 0747   GLUCOSE 100 (H) 06/03/2015 0747   BUN 18 06/03/2015 0747   CREATININE 0.75 06/03/2015 0747   CALCIUM 9.1 06/03/2015  0747   GFRNONAA 110.74 11/27/2009 1218   GFRAA 98 01/12/2007 1042   Lab Results  Component Value Date   HGBA1C 5.9 09/22/2015   No results found for: INSULIN CBC    Component Value Date/Time   WBC 6.5 11/13/2012 1003   RBC 5.03 11/13/2012 1003   HGB 14.3 11/13/2012 1003   HCT 42.7 11/13/2012 1003   PLT 268.0 11/13/2012 1003   MCV 84.8 11/13/2012 1003   MCV 86.4 05/20/2012 1404   MCH 26.9 (A) 05/20/2012 1404   MCHC 33.5 11/13/2012 1003   RDW 14.5 11/13/2012 1003   LYMPHSABS 1.9 11/13/2012 1003   MONOABS 0.4 11/13/2012 1003   EOSABS 0.2 11/13/2012 1003   BASOSABS 0.0 11/13/2012 1003   Iron/TIBC/Ferritin/ %Sat    Component Value Date/Time   IRON 88 05/20/2012 1422   TIBC 303 05/20/2012 1422   FERRITIN 80 05/20/2012 1422   IRONPCTSAT 29 05/20/2012 1422   Lipid Panel     Component Value Date/Time   CHOL 168 06/03/2015 0747   TRIG 45.0 06/03/2015 0747   HDL 56.30 06/03/2015 0747   CHOLHDL 3 06/03/2015 0747   VLDL 9.0 06/03/2015 0747   LDLCALC 103 (H) 06/03/2015 0747   Hepatic Function Panel     Component Value Date/Time   PROT 7.7 06/03/2015 0747   ALBUMIN 4.0 06/03/2015 0747   AST 20 06/03/2015 0747   ALT 15 06/03/2015 0747   ALKPHOS 80 06/03/2015 0747   BILITOT 0.4 06/03/2015 0747   BILIDIR 0.1 11/13/2012 1003      Component Value Date/Time   TSH 1.23 11/13/2012 1003   TSH 2.26 11/30/2011 1210   TSH 1.63 12/03/2010 1146    ECG  shows NSR with a rate of 55 BPM INDIRECT CALORIMETER done today shows a VO2 of 223 and a REE of 1551.    ASSESSMENT AND PLAN: Shortness of breath on exertion  Other fatigue - Plan: Vitamin B12, CBC With Differential, Comprehensive metabolic panel, Lipid Panel With LDL/HDL Ratio, T3, T4, free, TSH, Folate  Hyperglycemia - Plan: Insulin, random, Hemoglobin A1c  Moderate asthma, unspecified whether complicated, unspecified whether persistent  Essential hypertension  Chronic allergic rhinitis due to other allergic trigger,  unspecified seasonality - Plan: fluticasone furoate-vilanterol (BREO ELLIPTA) 200-25 MCG/INH AEPB  Vitamin D deficiency -  Plan: VITAMIN D 25 Hydroxy (Vit-D Deficiency, Fractures)  At risk for diabetes mellitus - Plan: Insulin, random, Hemoglobin A1c  Depression screening  Class 1 obesity without serious comorbidity with body mass index (BMI) of 33.0 to 33.9 in adult, unspecified obesity type  PLAN:  Fatigue Brenda Russo was informed that her fatigue may be related to obesity, depression or many other causes. Labs will be ordered, and in the meanwhile Brenda Russo has agreed to work on diet, exercise and weight loss to help with fatigue. Proper sleep hygiene was discussed including the need for 7-8 hours of quality sleep each night. A sleep study was not ordered based on symptoms and Epworth score.  Dyspnea on exertion Brenda Russo shortness of breath appears to be obesity related and exercise induced. She has agreed to work on weight loss and gradually increase exercise to treat her exercise induced shortness of breath. If Brenda Russo follows our instructions and loses weight without improvement of her shortness of breath, we will plan to refer to pulmonology. We will monitor this condition regularly. Brenda Russo agrees to this plan.  Hyperglycemia Fasting labs will be obtained and results with be discussed with Brenda Russo in 2 weeks at her follow up visit. In the meanwhile Brenda Russo was started on a lower simple carbohydrate diet and will work on weight loss efforts.  Vitamin D Deficiency Brenda Russo was informed that low vitamin D levels contributes to fatigue and are associated with obesity, breast, and colon cancer. She agrees to continue to take OTC Vit D and we will check labs and will follow up for routine testing of vitamin D, at least 2-3 times per year. She was informed of the risk of over-replacement of vitamin D and agrees to not increase her dose unless he discusses this with [name] first. Brenda Russo agrees  to follow up with our clinic in 2 weeks.  Hypertension We discussed sodium restriction, working on healthy weight loss, and a regular exercise program as the means to achieve improved blood pressure control. We will check labs and Kennedie agreed with this plan and agreed to follow up as directed. We will continue to monitor her blood pressure as well as her progress with the above lifestyle modifications. She will continue her medications as prescribed and will watch for signs of hypotension as she continues her lifestyle modifications.  Asthma Chrisma agrees to continue Breo and Albuterol prn, we will continue to monitor weight loss to help improve symptoms.  Allergic Rhinitis  Janetta agrees to take Flonase 2 sprays each nostril qd #1 with 1 refill.  Diabetes risk counselling Jayli was given extended (at least 15 minutes) diabetes prevention counseling today. She is 62 y.o. female and has risk factors for diabetes including obesity and hyperglycemia. We discussed intensive lifestyle modifications today with an emphasis on weight loss as well as increasing exercise and decreasing simple carbohydrates in her diet.  Depression Screen Shanyah had a mildly positive depression screening. Depression is commonly associated with obesity and often results in emotional eating behaviors. We will monitor this closely and work on CBT to help improve the non-hunger eating patterns. Referral to Psychology may be required if no improvement is seen as she continues in our clinic.  Obesity Brandilyn is currently in the action stage of change and her goal is to continue with weight loss efforts She has agreed to follow the Category 2 plan +100 calories Kadiatou has been instructed to work up to a goal of 150 minutes of combined cardio and strengthening exercise per week for  weight loss and overall health benefits. We discussed the following Behavioral Modification Stratagies today: increasing lean protein  intake, decreasing simple carbohydrates  and dealing with family or coworker sabotage  Takyra has agreed to follow up with our clinic in 2 weeks. She was informed of the importance of frequent follow up visits to maximize her success with intensive lifestyle modifications for her multiple health conditions. She was informed we would discuss her lab results at her next visit unless there is a critical issue that needs to be addressed sooner. Lisset agreed to keep her next visit at the agreed upon time to discuss these results.  I, Doreene Nest, am acting as scribe for Dennard Nip, MD  I have reviewed the above documentation for accuracy and completeness, and I agree with the above. -Dennard Nip, MD

## 2016-05-18 ENCOUNTER — Encounter: Payer: Self-pay | Admitting: Internal Medicine

## 2016-05-18 ENCOUNTER — Ambulatory Visit (INDEPENDENT_AMBULATORY_CARE_PROVIDER_SITE_OTHER): Payer: 59 | Admitting: Internal Medicine

## 2016-05-18 VITALS — BP 126/74 | HR 47 | Temp 98.1°F | Resp 14 | Ht 65.0 in | Wt 199.2 lb

## 2016-05-18 DIAGNOSIS — Z0000 Encounter for general adult medical examination without abnormal findings: Secondary | ICD-10-CM

## 2016-05-18 LAB — VITAMIN B12: VITAMIN B 12: 1835 pg/mL — AB (ref 232–1245)

## 2016-05-18 LAB — COMPREHENSIVE METABOLIC PANEL
ALK PHOS: 92 IU/L (ref 39–117)
ALT: 16 IU/L (ref 0–32)
AST: 18 IU/L (ref 0–40)
Albumin/Globulin Ratio: 1.3 (ref 1.2–2.2)
Albumin: 3.8 g/dL (ref 3.5–5.5)
BILIRUBIN TOTAL: 0.6 mg/dL (ref 0.0–1.2)
BUN / CREAT RATIO: 24 — AB (ref 9–23)
BUN: 18 mg/dL (ref 6–24)
CALCIUM: 9 mg/dL (ref 8.7–10.2)
CHLORIDE: 103 mmol/L (ref 96–106)
CO2: 27 mmol/L (ref 18–29)
CREATININE: 0.76 mg/dL (ref 0.57–1.00)
GFR calc Af Amer: 99 mL/min/{1.73_m2} (ref >59)
GFR calc non Af Amer: 86 mL/min/{1.73_m2} (ref >59)
GLUCOSE: 98 mg/dL (ref 65–99)
Globulin, Total: 2.9 g/dL (ref 1.5–4.5)
Potassium: 4.3 mmol/L (ref 3.5–5.2)
Sodium: 143 mmol/L (ref 134–144)
Total Protein: 6.7 g/dL (ref 6.0–8.5)

## 2016-05-18 LAB — CBC WITH DIFFERENTIAL
BASOS ABS: 0 10*3/uL (ref 0.0–0.2)
Basos: 1 %
EOS (ABSOLUTE): 0.2 10*3/uL (ref 0.0–0.4)
EOS: 3 %
HEMATOCRIT: 42.4 % (ref 34.0–46.6)
HEMOGLOBIN: 13.7 g/dL (ref 11.1–15.9)
IMMATURE GRANULOCYTES: 0 %
Immature Grans (Abs): 0 10*3/uL (ref 0.0–0.1)
LYMPHS ABS: 1.7 10*3/uL (ref 0.7–3.1)
LYMPHS: 29 %
MCH: 28.4 pg (ref 26.6–33.0)
MCHC: 32.3 g/dL (ref 31.5–35.7)
MCV: 88 fL (ref 79–97)
MONOCYTES: 6 %
Monocytes Absolute: 0.3 10*3/uL (ref 0.1–0.9)
Neutrophils Absolute: 3.6 10*3/uL (ref 1.4–7.0)
Neutrophils: 61 %
RBC: 4.83 x10E6/uL (ref 3.77–5.28)
RDW: 14.2 % (ref 12.3–15.4)
WBC: 5.8 10*3/uL (ref 3.4–10.8)

## 2016-05-18 LAB — LIPID PANEL WITH LDL/HDL RATIO
CHOLESTEROL TOTAL: 162 mg/dL (ref 100–199)
HDL: 57 mg/dL (ref >39)
LDL Calculated: 95 mg/dL (ref 0–99)
LDl/HDL Ratio: 1.7 ratio (ref 0.0–3.2)
Triglycerides: 50 mg/dL (ref 0–149)
VLDL Cholesterol Cal: 10 mg/dL (ref 5–40)

## 2016-05-18 LAB — FOLATE: Folate: 14.1 ng/mL (ref >3.0)

## 2016-05-18 LAB — T3: T3 TOTAL: 98 ng/dL (ref 71–180)

## 2016-05-18 LAB — HEMOGLOBIN A1C
Est. average glucose Bld gHb Est-mCnc: 114 mg/dL
HEMOGLOBIN A1C: 5.6 % (ref 4.8–5.6)

## 2016-05-18 LAB — INSULIN, RANDOM: INSULIN: 9.8 u[IU]/mL (ref 2.6–24.9)

## 2016-05-18 LAB — T4, FREE: FREE T4: 1.28 ng/dL (ref 0.82–1.77)

## 2016-05-18 LAB — VITAMIN D 25 HYDROXY (VIT D DEFICIENCY, FRACTURES): VIT D 25 HYDROXY: 43.5 ng/mL (ref 30.0–100.0)

## 2016-05-18 LAB — TSH: TSH: 1.77 u[IU]/mL (ref 0.450–4.500)

## 2016-05-18 MED ORDER — FLUTICASONE PROPIONATE 50 MCG/ACT NA SUSP: 2.0000 | Freq: Every day | NASAL | 5 refills | Status: DC

## 2016-05-18 MED FILL — FLUTICASONE PROP 50 MCG SPR: 50 | 30 days supply | Qty: 16 | Fill #0

## 2016-05-18 NOTE — Progress Notes (Signed)
Subjective:    Patient ID: Brenda Russo, female    DOB: Dec 12, 1955, 61 y.o.   MRN: [id number]  DOS:  05/18/2016 Type of visit - description : CPX Interval history:  Patient is concerned about her weight, she saw the bariatric clinic yesterday, labs were done and although she already started to change her diet and is losing weight, she will follow-up a plan that they are preparing.   Review of Systems Has left lower abdominal pain on and off. No new sx  She sometimes skip her BP meds if  "the BP is good" Reports occasional urinary frequency for the last year, not every night; no associated  burning, difficulty urinating or dysuria Some fatigue.   Other than above, a 14 point review of systems is negative     Past Medical History:  Diagnosis Date  . Allergic rhinitis   . Anemia   . Anxiety   . Asthma   . HTN (hypertension)   . Hyperlipidemia    borderline  . Obese   . OSA (obstructive sleep apnea)    mild ,no CPAP  . Overweight(278.02)   . Plantar wart    history  . Prediabetes   . Shortness of breath     Past Surgical History:  Procedure Laterality Date  . BREAST BIOPSY Right 09/02/2015  . CESAREAN SECTION    . TUBAL LIGATION      Social History   Social History  . Marital status: Married    Spouse name: N/A  . Number of children: 3  . Years of education: N/A   Occupational History  . 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 Smoking status: Never Smoker  . Smokeless tobacco: Never Used  . Alcohol use No  . Drug use: No  . Sexual activity: Not Currently   Other Topics Concern  . Not on file   Social History Narrative   3 biological children   1 adopted child   3 step-children   Household-- pt , husband and a son     Family History  Problem Relation Age of Onset  . Heart disease Mother     pacemaker   . Hypertension Mother   . Hyperlipidemia Mother   . Obesity Mother   . Asthma Father     . Pancreatic cancer Brother   . Breast cancer Maternal Aunt   . Colon cancer Neg Hx      Allergies as of 05/18/2016      Reactions   Eggs Or Egg-derived Products    Shortness of breath, wheezing      Medication List       Accurate as of 05/18/16 11:59 PM. Always use your most recent med list.          albuterol (2.5 MG/3ML) 0.083% nebulizer solution Commonly known as:  PROVENTIL Take 3 mLs (2.5 mg total) by nebulization 3 (three) times daily as needed.   albuterol 108 (90 Base) MCG/ACT inhaler Commonly known as:  VENTOLIN HFA Inhale 1-2 puffs into the lungs every 4 (four) hours as needed for wheezing.   amLODipine 5 MG tablet Commonly known as:  NORVASC Take 1 tablet (5 mg total) by mouth daily.   B-COMPLEX BALANCED PO Take by mouth.   CALCIUM CITRATE PLUS/MAGNESIUM Tabs Take 1 tablet by mouth.   COMPLETE WOMENS PO Take 1 capsule by mouth 2 (two) times daily.   CoQ10 100  MG Caps Take 1 capsule by mouth daily.   Fish Oil 1000 MG Caps Take 1 capsule by mouth.   fluticasone 50 MCG/ACT nasal spray Commonly known as:  FLONASE Place 2 sprays into both nostrils daily.   fluticasone furoate-vilanterol 200-25 MCG/INH Aepb Commonly known as:  BREO ELLIPTA Inhale 2 puffs into the lungs daily.   losartan-hydrochlorothiazide 100-12.5 MG tablet Commonly known as:  HYZAAR Take 1 tablet by mouth daily.   magnesium oxide 400 MG tablet Commonly known as:  MAG-OX 2 tablets daily. 400-800 mg daily   pantoprazole 40 MG tablet Commonly known as:  PROTONIX Take 1 tablet (40 mg total) by mouth daily. 30 minutes before the last meal of the day   vitamin C 1000 MG tablet Take 1,000 mg by mouth daily.   Vitamin D3 5000 units Caps Take 1 capsule by mouth daily.          Objective:   Physical Exam BP 126/74 (BP Location: Left Arm, Patient Position: Sitting, Cuff Size: Normal)   Pulse (!) 47   Temp 98.1 F (36.7 C) (Oral)   Resp 14   Ht 5\' 5"  (1.651 m)   Wt 199  lb 4 oz (90.4 kg)   SpO2 98%   BMI 33.16 kg/m   General:   Well developed, well nourished . NAD.  Neck: No  thyromegaly  HEENT:  Normocephalic . Face symmetric, atraumatic Lungs:  CTA B Normal respiratory effort, no intercostal retractions, no accessory muscle use. Heart: RRR,  no murmur.  No pretibial edema bilaterally  Abdomen:  Not distended, soft, non-tender. No rebound or rigidity.   Skin: Exposed areas without rash. Not pale. Not jaundice Neurologic:  alert & oriented X3.  Speech normal, gait appropriate for age and unassisted Strength symmetric and appropriate for age.  Psych: Cognition and judgment appear intact.  Cooperative with normal attention span and concentration.  Behavior appropriate. No anxious or depressed appearing.    Assessment & Plan:  Assessment Prediabetes A1c 6.0 (05/2014) HTN Sinus bradycardia-PVCs: Saw cardiology 04/26/2017. She was asx RTC prn Hyperlipidemia Anxiety Obesity Asthma - h/o intubation x2 in the 90s  OSA, no CPAP BTL LLQ abd pain on-off , last GI visit 11-2015, CT abd (-)  PLAN:  Prediabetes: Last A1c excellent. Working on diet and exercise HTN: On Hyzaar and amlodipine, sometimes skip medications if BP is okay. Recommend to take Hyzaar every morning and amlodipine in the afternoon consistently, decreased doses only if BPs are consistently in the low side. She is to let me know Obesity: She is already working on diet and exercise and yesterday visit date bariatric doctor for the first time. She is very motivated to continue improving her lifestyle. RTC one year.

## 2016-05-18 NOTE — Assessment & Plan Note (Addendum)
--  Tdap ,pnm shot --declined  again today   --Female care:  PAPs per Dr. Garwin Brothers   Last MMG 08-2015, abnormal, had a Bx (-) per pt  --[facility]--08/19/08- normal, Dr Ardis Hughs,  follow-up in 10 years --dexa 07-2015 WNL  Diet-exercise discussed , doing better !  Labs done yesterday reviewed, all wnl

## 2016-05-18 NOTE — Patient Instructions (Signed)
  GO TO THE FRONT DESK Schedule your next appointment for a  Physical in 1 year   Check the  blood pressure   daily Be sure your blood pressure is between 110/65 and  145/85. If it is consistently higher or lower, let me know  Take your meds for BP daily Consider decrease doses if BP consistently low, let me know

## 2016-05-18 NOTE — Progress Notes (Signed)
Pre visit review using our clinic review tool, if applicable. No additional management support is needed unless otherwise documented below in the visit note. 

## 2016-05-19 NOTE — Assessment & Plan Note (Signed)
Prediabetes: Last A1c excellent. Working on diet and exercise HTN: On Hyzaar and amlodipine, sometimes skip medications if BP is okay. Recommend to take Hyzaar every morning and amlodipine in the afternoon consistently, decreased doses only if BPs are consistently in the low side. She is to let me know Obesity: She is already working on diet and exercise and yesterday visit date bariatric doctor for the first time. She is very motivated to continue improving her lifestyle. RTC one year.

## 2016-05-31 MED FILL — LOSARTAN-HCTZ 100-12.5 MG T: 100-12.5 | 30 days supply | Qty: 30 | Fill #1

## 2016-05-31 MED FILL — VENTOLIN HFA 90 MCG INHALER: 108 (90 BAS | 29 days supply | Qty: 18 | Fill #1

## 2016-05-31 MED FILL — BREO ELLIPTA 200-25 MCG INH: 200-25 | 30 days supply | Qty: 60 | Fill #1

## 2016-05-31 MED FILL — AMLODIPINE BESYLATE 5 MG TA: 5 | 30 days supply | Qty: 30 | Fill #1

## 2016-06-01 ENCOUNTER — Ambulatory Visit (INDEPENDENT_AMBULATORY_CARE_PROVIDER_SITE_OTHER): Payer: 59 | Admitting: Family Medicine

## 2016-06-01 VITALS — BP 112/73 | HR 50 | Temp 98.0°F | Ht 65.0 in | Wt 199.0 lb

## 2016-06-01 DIAGNOSIS — E669 Obesity, unspecified: Secondary | ICD-10-CM | POA: Diagnosis not present

## 2016-06-01 DIAGNOSIS — Z6833 Body mass index (BMI) 33.0-33.9, adult: Secondary | ICD-10-CM

## 2016-06-01 DIAGNOSIS — E559 Vitamin D deficiency, unspecified: Secondary | ICD-10-CM | POA: Diagnosis not present

## 2016-06-01 DIAGNOSIS — F3289 Other specified depressive episodes: Secondary | ICD-10-CM

## 2016-06-01 MED ORDER — BUPROPION HCL ER (SR) 150 MG PO TB12: 150.0000 mg | ORAL_TABLET | Freq: Every morning | ORAL | 1 refills | Status: DC

## 2016-06-01 MED FILL — BUPROPION SR 150 MG TABLET: 150 | 30 days supply | Qty: 30 | Fill #0

## 2016-06-01 NOTE — Progress Notes (Signed)
Office: [phone]  /  Fax: [phone]   HPI:   Chief Complaint: OBESITY Brenda Russo is here to discuss her progress with her obesity treatment plan. She is following her eating plan approximately 80 % of the time and states she is exercising 0 minutes 0 times per week. Brenda Russo struggled to follow her category 2 plan due to boredom and is struggling to eat all her protein. She would like more breakfast options as she cannot eat eggs. Her weight is 199 lb (90.3 kg) today and has had a weight loss of 1 pound over a period of 2 weeks since her last visit. She has lost 1 lbs since starting treatment with [name].  Vitamin D deficiency Brenda Russo has a diagnosis of vitamin D deficiency. She is currently taking vit D 5,000 IU daily and is almost at goal. She denies nausea, vomiting or muscle weakness.  Depression with emotional eating behaviors Brenda Russo notes increased comfort/stress eating. She is struggling with emotional eating and using food for comfort to the extent that it is negatively impacting her health. She is using food when she is not hungry, worse in the afternoon. Brenda Russo sometimes feels she is out of control and then feels guilty that she made poor food choices. She has been working on behavior modification techniques to help reduce her emotional eating and has been somewhat successful. She shows no sign of suicidal or homicidal ideations.  Depression screen Brenda Russo 2/9 05/17/2016 04/12/2016 12/22/2015 11/21/2015 11/18/2015  Decreased Interest 0 0 0 0 0  Down, Depressed, Hopeless 1 0 0 0 0  PHQ - 2 Score 1 0 0 0 0  Altered sleeping 2 - - - -  Tired, decreased energy 1 - - - -  Change in appetite 2 - - - -  Feeling bad or failure about yourself  0 - - - -  Trouble concentrating 2 - - - -  Moving slowly or fidgety/restless 0 - - - -  Suicidal thoughts 0 - - - -  PHQ-9 Score 8 - - - -     Wt Readings from Last 500 Encounters:  06/01/16 199 lb (90.3 kg)  05/18/16 199 lb 4 oz (90.4 kg)   05/17/16 200 lb (90.7 kg)  05/05/16 205 lb (93 kg)  04/13/16 202 lb 6 oz (91.8 kg)  04/12/16 205 lb (93 kg)  12/24/15 210 lb (95.3 kg)  12/22/15 211 lb 6.4 oz (95.9 kg)  11/21/15 210 lb (95.3 kg)  11/18/15 212 lb (96.2 kg)  11/09/15 211 lb 4 oz (95.8 kg)  09/22/15 209 lb 6.4 oz (95 kg)  07/01/15 208 lb 8 oz (94.6 kg)  06/27/15 206 lb 12.8 oz (93.8 kg)  06/10/15 210 lb 6.4 oz (95.4 kg)  05/20/15 213 lb 6 oz (96.8 kg)  02/10/15 217 lb 12.8 oz (98.8 kg)  12/10/14 218 lb 3.2 oz (99 kg)  09/03/14 215 lb 6.4 oz (97.7 kg)  08/20/14 217 lb (98.4 kg)  04/29/14 210 lb 4 oz (95.4 kg)  11/06/13 210 lb (95.3 kg)  06/19/13 216 lb (98 kg)  11/13/12 216 lb 6.4 oz (98.2 kg)  09/25/12 221 lb (100.2 kg)  07/30/12 221 lb (100.2 kg)  07/05/12 221 lb 9.6 oz (100.5 kg)  05/20/12 220 lb (99.8 kg)  11/30/11 224 lb 12.8 oz (102 kg)  08/30/11 218 lb 9.6 oz (99.2 kg)  06/03/11 216 lb 3.2 oz (98.1 kg)  12/03/10 212 lb 6.4 oz (96.3 kg)  08/09/10 215 lb 12.8 oz (97.9  kg)  07/02/10 219 lb (99.3 kg)  03/05/10 219 lb (99.3 kg)  11/27/09 213 lb 2.1 oz (96.7 kg)  06/04/09 218 lb 6.1 oz (99.1 kg)  05/21/09 214 lb 8 oz (97.3 kg)  12/11/08 213 lb 4 oz (96.7 kg)  08/15/08 213 lb (96.6 kg)  02/22/08 212 lb 8 oz (96.4 kg)  02/07/08 217 lb 4 oz (98.5 kg)  07/27/07 205 lb 6.1 oz (93.2 kg)  04/03/07 202 lb 2.1 oz (91.7 kg)     ALLERGIES: Allergies  Allergen Reactions  . Eggs Or Egg-Derived Products     Shortness of breath, wheezing    MEDICATIONS: Current Outpatient Prescriptions on File Prior to Visit  Medication Sig Dispense Refill  . albuterol (PROVENTIL) (2.5 MG/3ML) 0.083% nebulizer solution Take 3 mLs (2.5 mg total) by nebulization 3 (three) times daily as needed. 75 mL 6  . albuterol (VENTOLIN HFA) 108 (90 Base) MCG/ACT inhaler Inhale 1-2 puffs into the lungs every 4 (four) hours as needed for wheezing. 18 g 5  . amLODipine (NORVASC) 5 MG tablet Take 1 tablet (5 mg total) by mouth daily.  (Patient taking differently: Take 2.5 mg by mouth daily. ) 30 tablet 5  . Ascorbic Acid (VITAMIN C) 1000 MG tablet Take 1,000 mg by mouth daily.    . B Complex-C-Folic Acid (B-COMPLEX BALANCED PO) Take by mouth.    . Cholecalciferol (VITAMIN D3) 5000 UNITS CAPS Take 1 capsule by mouth daily.    . Coenzyme Q10 (COQ10) 100 MG CAPS Take 1 capsule by mouth daily.    . fluticasone (FLONASE) 50 MCG/ACT nasal spray Place 2 sprays into both nostrils daily. 16 g 5  . fluticasone furoate-vilanterol (BREO ELLIPTA) 200-25 MCG/INH AEPB Inhale 2 puffs into the lungs daily. 1 each 0  . losartan-hydrochlorothiazide (HYZAAR) 100-12.5 MG tablet Take 1 tablet by mouth daily. (Patient taking differently: Take 0.5 tablets by mouth daily. ) 30 tablet 5  . magnesium oxide (MAG-OX) 400 MG tablet 2 tablets daily. 400-800 mg daily    . Multiple Minerals-Vitamins (CALCIUM CITRATE PLUS/MAGNESIUM) TABS Take 1 tablet by mouth.    . Multiple Vitamins-Minerals (COMPLETE WOMENS PO) Take 1 capsule by mouth 2 (two) times daily.      . Omega-3 Fatty Acids (FISH OIL) 1000 MG CAPS Take 1 capsule by mouth.    . pantoprazole (PROTONIX) 40 MG tablet Take 1 tablet (40 mg total) by mouth daily. 30 minutes before the last meal of the day 90 tablet 3   No current facility-administered medications on file prior to visit.     PAST MEDICAL HISTORY: Past Medical History:  Diagnosis Date  . Allergic rhinitis   . Anemia   . Anxiety   . Asthma   . HTN (hypertension)   . Hyperlipidemia    borderline  . Obese   . OSA (obstructive sleep apnea)    mild ,no CPAP  . Overweight(278.02)   . Plantar wart    history  . Prediabetes   . Shortness of breath     PAST SURGICAL HISTORY: Past Surgical History:  Procedure Laterality Date  . BREAST BIOPSY Right 09/02/2015  . CESAREAN SECTION    . TUBAL LIGATION      SOCIAL HISTORY: Social History  Substance Use Topics  . Smoking status: Never Smoker  . Smokeless tobacco: Never Used  .  Alcohol use No    FAMILY HISTORY: Family History  Problem Relation Age of Onset  . Heart disease Mother  pacemaker   . Hypertension Mother   . Hyperlipidemia Mother   . Obesity Mother   . Asthma Father   . Pancreatic cancer Brother   . Breast cancer Maternal Aunt   . Colon cancer Neg Hx     ROS: Review of Systems  Constitutional: Positive for weight loss.  Gastrointestinal: Negative for nausea and vomiting.  Musculoskeletal:       Negative muscle weakness  Psychiatric/Behavioral: Positive for depression. Negative for suicidal ideas.    PHYSICAL EXAM: Blood pressure 112/73, pulse (!) 50, temperature 98 F (36.7 C), temperature source Oral, height 5\' 5"  (1.651 m), weight 199 lb (90.3 kg), SpO2 96 %. Body mass index is 33.12 kg/m. Physical Exam  Constitutional: She is oriented to person, place, and time. She appears well-developed and well-nourished.  Cardiovascular: Normal rate.   Pulmonary/Chest: Effort normal.  Musculoskeletal: Normal range of motion.  Neurological: She is oriented to person, place, and time.  Skin: Skin is warm and dry.  Vitals reviewed.   RECENT LABS AND TESTS: BMET    Component Value Date/Time   NA 143 05/17/2016 0949   K 4.3 05/17/2016 0949   CL 103 05/17/2016 0949   CO2 27 05/17/2016 0949   GLUCOSE 98 05/17/2016 0949   GLUCOSE 100 (H) 06/03/2015 0747   BUN 18 05/17/2016 0949   CREATININE 0.76 05/17/2016 0949   CALCIUM 9.0 05/17/2016 0949   GFRNONAA 86 05/17/2016 0949   GFRAA 99 05/17/2016 0949   Lab Results  Component Value Date   HGBA1C 5.6 05/17/2016   HGBA1C 5.9 09/22/2015   HGBA1C 6.2 06/03/2015   HGBA1C 6.0 05/21/2014   Lab Results  Component Value Date   INSULIN 9.8 05/17/2016   CBC    Component Value Date/Time   WBC 5.8 05/17/2016 0949   WBC 6.5 11/13/2012 1003   RBC 4.83 05/17/2016 0949   RBC 5.03 11/13/2012 1003   HGB 14.3 11/13/2012 1003   HCT 42.4 05/17/2016 0949   PLT 268.0 11/13/2012 1003   MCV 88  05/17/2016 0949   MCH 28.4 05/17/2016 0949   MCH 26.9 (A) 05/20/2012 1404   MCHC 32.3 05/17/2016 0949   MCHC 33.5 11/13/2012 1003   RDW 14.2 05/17/2016 0949   LYMPHSABS 1.7 05/17/2016 0949   MONOABS 0.4 11/13/2012 1003   EOSABS 0.2 05/17/2016 0949   BASOSABS 0.0 05/17/2016 0949   Iron/TIBC/Ferritin/ %Sat    Component Value Date/Time   IRON 88 05/20/2012 1422   TIBC 303 05/20/2012 1422   FERRITIN 80 05/20/2012 1422   IRONPCTSAT 29 05/20/2012 1422   Lipid Panel     Component Value Date/Time   CHOL 162 05/17/2016 0949   TRIG 50 05/17/2016 0949   HDL 57 05/17/2016 0949   CHOLHDL 3 06/03/2015 0747   VLDL 9.0 06/03/2015 0747   LDLCALC 95 05/17/2016 0949   Hepatic Function Panel     Component Value Date/Time   PROT 6.7 05/17/2016 0949   ALBUMIN 3.8 05/17/2016 0949   AST 18 05/17/2016 0949   ALT 16 05/17/2016 0949   ALKPHOS 92 05/17/2016 0949   BILITOT 0.6 05/17/2016 0949   BILIDIR 0.1 11/13/2012 1003      Component Value Date/Time   TSH 1.770 05/17/2016 0949   TSH 1.23 11/13/2012 1003   TSH 2.26 11/30/2011 1210    ASSESSMENT AND PLAN: Vitamin D deficiency  Other depression - Plan: buPROPion (WELLBUTRIN SR) 150 MG 12 hr tablet  Class 1 obesity without serious comorbidity with body mass index (  BMI) of 33.0 to 33.9 in adult, unspecified obesity type  PLAN:  Vitamin D Deficiency Brenda Russo was informed that low vitamin D levels contributes to fatigue and are associated with obesity, breast, and colon cancer. She agrees to continue to take Vitamin D 5,000 IU daily and we will re-check labs in 3 months and will follow up for routine testing of vitamin D, at least 2-3 times per year. She was informed of the risk of over-replacement of vitamin D and agrees to not increase her dose unless he discusses this with [name] first. Brenda Russo agrees to follow up with our clinic in 2 weeks.  Depression with Emotional Eating Behaviors We discussed behavior modification techniques today to  help Brenda Russo deal with her emotional eating and depression. She has agreed to start to take Wellbutrin SR 150 mg every morning #30 with no refills and agreed to follow up as directed.  Obesity Brenda Russo is currently in the action stage of change. As such, her goal is to continue with weight loss efforts She has agreed to follow the Category 2 plan Brenda Russo has been instructed to work up to a goal of 150 minutes of combined cardio and strengthening exercise per week for weight loss and overall health benefits. We discussed the following Behavioral Modification Stratagies today: increasing lean protein intake, increasing H2O and decreasing simple carbohydrates   Brenda Russo has agreed to follow up with our clinic in 2 weeks. She was informed of the importance of frequent follow up visits to maximize her success with intensive lifestyle modifications for her multiple health conditions.  I, Doreene Nest, am acting as scribe for Dennard Nip, MD  I have reviewed the above documentation for accuracy and completeness, and I agree with the above. -Dennard Nip, MD

## 2016-06-03 ENCOUNTER — Encounter (INDEPENDENT_AMBULATORY_CARE_PROVIDER_SITE_OTHER): Payer: Self-pay | Admitting: Family Medicine

## 2016-06-16 ENCOUNTER — Encounter (INDEPENDENT_AMBULATORY_CARE_PROVIDER_SITE_OTHER): Payer: Self-pay | Admitting: Family Medicine

## 2016-06-16 ENCOUNTER — Ambulatory Visit (INDEPENDENT_AMBULATORY_CARE_PROVIDER_SITE_OTHER): Payer: 59 | Admitting: Family Medicine

## 2016-06-16 VITALS — BP 111/73 | HR 57 | Temp 97.9°F | Ht 65.0 in | Wt 195.0 lb

## 2016-06-16 DIAGNOSIS — Z6832 Body mass index (BMI) 32.0-32.9, adult: Secondary | ICD-10-CM | POA: Diagnosis not present

## 2016-06-16 DIAGNOSIS — E669 Obesity, unspecified: Secondary | ICD-10-CM | POA: Diagnosis not present

## 2016-06-16 DIAGNOSIS — F3289 Other specified depressive episodes: Secondary | ICD-10-CM

## 2016-06-16 NOTE — Progress Notes (Signed)
Office: [phone]  /  Fax: [phone]   HPI:   Chief Complaint: OBESITY Brenda Russo is here to discuss her progress with her obesity treatment plan. She is on the  follow the Category 2 plan and is following her eating plan approximately 90 % of the time. She states she is exercising 0 minutes 0 times per week. Patient is doing well with weight loss, she started to deviate for breakfast and would like more choices. She feels well overall, not deprived and her hunger seems controlled.   Her weight is 195 lb (88.5 kg) today and has had a weight loss of 4 pounds over a period of 2 weeks since her last visit. She has lost 6 lbs since starting treatment with [name].  Depression with emotional eating behaviors Brenda Russo started Wellbutrin and notes a decrease in emotional eating. Her mood is good, no insomnia and her blood pressure is stable. She has been working on behavior modification techniques to help reduce her emotional eating and has been very successful. She shows no sign of suicidal or homicidal ideations.  Depression screen [facility] 2/9 05/17/2016 04/12/2016 12/22/2015 11/21/2015 11/18/2015  Decreased Interest 0 0 0 0 0  Down, Depressed, Hopeless 1 0 0 0 0  PHQ - 2 Score 1 0 0 0 0  Altered sleeping 2 - - - -  Tired, decreased energy 1 - - - -  Change in appetite 2 - - - -  Feeling bad or failure about yourself  0 - - - -  Trouble concentrating 2 - - - -  Moving slowly or fidgety/restless 0 - - - -  Suicidal thoughts 0 - - - -  PHQ-9 Score 8 - - - -     Wt Readings from Last 500 Encounters:  06/16/16 195 lb (88.5 kg)  06/01/16 199 lb (90.3 kg)  05/18/16 199 lb 4 oz (90.4 kg)  05/17/16 200 lb (90.7 kg)  05/05/16 205 lb (93 kg)  04/13/16 202 lb 6 oz (91.8 kg)  04/12/16 205 lb (93 kg)  12/24/15 210 lb (95.3 kg)  12/22/15 211 lb 6.4 oz (95.9 kg)  11/21/15 210 lb (95.3 kg)  11/18/15 212 lb (96.2 kg)  11/09/15 211 lb 4 oz (95.8 kg)  09/22/15 209 lb 6.4 oz (95 kg)  07/01/15 208 lb 8 oz  (94.6 kg)  06/27/15 206 lb 12.8 oz (93.8 kg)  06/10/15 210 lb 6.4 oz (95.4 kg)  05/20/15 213 lb 6 oz (96.8 kg)  02/10/15 217 lb 12.8 oz (98.8 kg)  12/10/14 218 lb 3.2 oz (99 kg)  09/03/14 215 lb 6.4 oz (97.7 kg)  08/20/14 217 lb (98.4 kg)  04/29/14 210 lb 4 oz (95.4 kg)  11/06/13 210 lb (95.3 kg)  06/19/13 216 lb (98 kg)  11/13/12 216 lb 6.4 oz (98.2 kg)  09/25/12 221 lb (100.2 kg)  07/30/12 221 lb (100.2 kg)  07/05/12 221 lb 9.6 oz (100.5 kg)  05/20/12 220 lb (99.8 kg)  11/30/11 224 lb 12.8 oz (102 kg)  08/30/11 218 lb 9.6 oz (99.2 kg)  06/03/11 216 lb 3.2 oz (98.1 kg)  12/03/10 212 lb 6.4 oz (96.3 kg)  08/09/10 215 lb 12.8 oz (97.9 kg)  07/02/10 219 lb (99.3 kg)  03/05/10 219 lb (99.3 kg)  11/27/09 213 lb 2.1 oz (96.7 kg)  06/04/09 218 lb 6.1 oz (99.1 kg)  05/21/09 214 lb 8 oz (97.3 kg)  12/11/08 213 lb 4 oz (96.7 kg)  08/15/08 213 lb (96.6 kg)  02/22/08 212 lb 8 oz (96.4 kg)  02/07/08 217 lb 4 oz (98.5 kg)  07/27/07 205 lb 6.1 oz (93.2 kg)  04/03/07 202 lb 2.1 oz (91.7 kg)     ALLERGIES: Allergies  Allergen Reactions  . Eggs Or Egg-Derived Products     Shortness of breath, wheezing    MEDICATIONS: Current Outpatient Prescriptions on File Prior to Visit  Medication Sig Dispense Refill  . albuterol (PROVENTIL) (2.5 MG/3ML) 0.083% nebulizer solution Take 3 mLs (2.5 mg total) by nebulization 3 (three) times daily as needed. 75 mL 6  . albuterol (VENTOLIN HFA) 108 (90 Base) MCG/ACT inhaler Inhale 1-2 puffs into the lungs every 4 (four) hours as needed for wheezing. 18 g 5  . amLODipine (NORVASC) 5 MG tablet Take 1 tablet (5 mg total) by mouth daily. (Patient taking differently: Take 2.5 mg by mouth daily. ) 30 tablet 5  . Ascorbic Acid (VITAMIN C) 1000 MG tablet Take 1,000 mg by mouth daily.    . B Complex-C-Folic Acid (B-COMPLEX BALANCED PO) Take by mouth.    Marland Kitchen buPROPion (WELLBUTRIN SR) 150 MG 12 hr tablet Take 1 tablet (150 mg total) by mouth every morning. 30 tablet  1  . Cholecalciferol (VITAMIN D3) 5000 UNITS CAPS Take 1 capsule by mouth daily.    . Coenzyme Q10 (COQ10) 100 MG CAPS Take 1 capsule by mouth daily.    . fluticasone (FLONASE) 50 MCG/ACT nasal spray Place 2 sprays into both nostrils daily. 16 g 5  . fluticasone furoate-vilanterol (BREO ELLIPTA) 200-25 MCG/INH AEPB Inhale 2 puffs into the lungs daily. 1 each 0  . losartan-hydrochlorothiazide (HYZAAR) 100-12.5 MG tablet Take 1 tablet by mouth daily. (Patient taking differently: Take 0.5 tablets by mouth daily. ) 30 tablet 5  . magnesium oxide (MAG-OX) 400 MG tablet 2 tablets daily. 400-800 mg daily    . Multiple Minerals-Vitamins (CALCIUM CITRATE PLUS/MAGNESIUM) TABS Take 1 tablet by mouth.    . Multiple Vitamins-Minerals (COMPLETE WOMENS PO) Take 1 capsule by mouth 2 (two) times daily.      . Omega-3 Fatty Acids (FISH OIL) 1000 MG CAPS Take 1 capsule by mouth.    . pantoprazole (PROTONIX) 40 MG tablet Take 1 tablet (40 mg total) by mouth daily. 30 minutes before the last meal of the day 90 tablet 3   No current facility-administered medications on file prior to visit.     PAST MEDICAL HISTORY: Past Medical History:  Diagnosis Date  . Allergic rhinitis   . Anemia   . Anxiety   . Asthma   . HTN (hypertension)   . Hyperlipidemia    borderline  . Obese   . OSA (obstructive sleep apnea)    mild ,no CPAP  . Overweight(278.02)   . Plantar wart    history  . Prediabetes   . Shortness of breath     PAST SURGICAL HISTORY: Past Surgical History:  Procedure Laterality Date  . BREAST BIOPSY Right 09/02/2015  . CESAREAN SECTION    . TUBAL LIGATION      SOCIAL HISTORY: Social History  Substance Use Topics  . Smoking status: Never Smoker  . Smokeless tobacco: Never Used  . Alcohol use No    FAMILY HISTORY: Family History  Problem Relation Age of Onset  . Heart disease Mother        pacemaker   . Hypertension Mother   . Hyperlipidemia Mother   . Obesity Mother   . Asthma  Father   . Pancreatic cancer  Brother   . Breast cancer Maternal Aunt   . Colon cancer Neg Hx     ROS: Review of Systems  Constitutional: Positive for weight loss.  All other systems reviewed and are negative.   PHYSICAL EXAM: Blood pressure 111/73, pulse (!) 57, temperature 97.9 F (36.6 C), temperature source Oral, height 5\' 5"  (1.651 m), weight 195 lb (88.5 kg), SpO2 96 %. Body mass index is 32.45 kg/m. Physical Exam  Constitutional: She is oriented to person, place, and time. She appears well-developed and well-nourished.  Eyes: Pupils are equal, round, and reactive to light.  Neck: Normal range of motion.  Cardiovascular: Normal rate.   Pulmonary/Chest: Effort normal.  Musculoskeletal: Normal range of motion.  Neurological: She is alert and oriented to person, place, and time.  Skin: Skin is warm and dry.  Psychiatric: She has a normal mood and affect. Her behavior is normal.  Vitals reviewed.   RECENT LABS AND TESTS: BMET    Component Value Date/Time   NA 143 05/17/2016 0949   K 4.3 05/17/2016 0949   CL 103 05/17/2016 0949   CO2 27 05/17/2016 0949   GLUCOSE 98 05/17/2016 0949   GLUCOSE 100 (H) 06/03/2015 0747   BUN 18 05/17/2016 0949   CREATININE 0.76 05/17/2016 0949   CALCIUM 9.0 05/17/2016 0949   GFRNONAA 86 05/17/2016 0949   GFRAA 99 05/17/2016 0949   Lab Results  Component Value Date   HGBA1C 5.6 05/17/2016   HGBA1C 5.9 09/22/2015   HGBA1C 6.2 06/03/2015   HGBA1C 6.0 05/21/2014   Lab Results  Component Value Date   INSULIN 9.8 05/17/2016   CBC    Component Value Date/Time   WBC 5.8 05/17/2016 0949   WBC 6.5 11/13/2012 1003   RBC 4.83 05/17/2016 0949   RBC 5.03 11/13/2012 1003   HGB 14.3 11/13/2012 1003   HCT 42.4 05/17/2016 0949   PLT 268.0 11/13/2012 1003   MCV 88 05/17/2016 0949   MCH 28.4 05/17/2016 0949   MCH 26.9 (A) 05/20/2012 1404   MCHC 32.3 05/17/2016 0949   MCHC 33.5 11/13/2012 1003   RDW 14.2 05/17/2016 0949   LYMPHSABS 1.7  05/17/2016 0949   MONOABS 0.4 11/13/2012 1003   EOSABS 0.2 05/17/2016 0949   BASOSABS 0.0 05/17/2016 0949   Iron/TIBC/Ferritin/ %Sat    Component Value Date/Time   IRON 88 05/20/2012 1422   TIBC 303 05/20/2012 1422   FERRITIN 80 05/20/2012 1422   IRONPCTSAT 29 05/20/2012 1422   Lipid Panel     Component Value Date/Time   CHOL 162 05/17/2016 0949   TRIG 50 05/17/2016 0949   HDL 57 05/17/2016 0949   CHOLHDL 3 06/03/2015 0747   VLDL 9.0 06/03/2015 0747   LDLCALC 95 05/17/2016 0949   Hepatic Function Panel     Component Value Date/Time   PROT 6.7 05/17/2016 0949   ALBUMIN 3.8 05/17/2016 0949   AST 18 05/17/2016 0949   ALT 16 05/17/2016 0949   ALKPHOS 92 05/17/2016 0949   BILITOT 0.6 05/17/2016 0949   BILIDIR 0.1 11/13/2012 1003      Component Value Date/Time   TSH 1.770 05/17/2016 0949   TSH 1.23 11/13/2012 1003   TSH 2.26 11/30/2011 1210    ASSESSMENT AND PLAN: Other depression  Class 1 obesity without serious comorbidity with body mass index (BMI) of 32.0 to 32.9 in adult, unspecified obesity type  PLAN: Depression with Emotional Eating Behaviors We discussed behavior modification techniques today to help Brenda Russo deal with her  emotional eating and depression. She has agreed to take Wellbutrin SR 150 mg qd and agreed to follow up as directed.   Obesity Brenda Russo is currently in the action stage of change. As such, her goal is to continue with weight loss efforts She has agreed to follow the Category 2 plan Brenda Russo has been instructed to work up to a goal of 150 minutes of combined cardio and strengthening exercise per week for weight loss and overall health benefits. We discussed the following Behavioral Modification Stratagies today: increasing lean protein intake and work on meal planning and easy cooking plans   We spent > than 50% of the 15 minute visit on the counseling as documented in the note.  Brenda Russo has agreed to follow up with our clinic in 2  weeks. She was informed of the importance of frequent follow up visits to maximize her success with intensive lifestyle modifications for her multiple health conditions.  I, April Moore, am acting as Education administrator for Dennard Nip, MD  I have reviewed the above documentation for accuracy and completeness, and I agree with the above. -Dennard Nip, MD

## 2016-06-27 MED FILL — BUPROPION SR 150 MG TABLET: 150 | 30 days supply | Qty: 30 | Fill #1

## 2016-06-30 ENCOUNTER — Ambulatory Visit (INDEPENDENT_AMBULATORY_CARE_PROVIDER_SITE_OTHER): Payer: 59 | Admitting: Family Medicine

## 2016-06-30 VITALS — BP 114/71 | HR 60 | Temp 97.9°F | Ht 65.0 in | Wt 196.0 lb

## 2016-06-30 DIAGNOSIS — F3289 Other specified depressive episodes: Secondary | ICD-10-CM

## 2016-06-30 DIAGNOSIS — E669 Obesity, unspecified: Secondary | ICD-10-CM | POA: Diagnosis not present

## 2016-06-30 DIAGNOSIS — Z6832 Body mass index (BMI) 32.0-32.9, adult: Secondary | ICD-10-CM

## 2016-06-30 MED ORDER — BUPROPION HCL ER (SR) 200 MG PO TB12: 200.0000 mg | ORAL_TABLET | Freq: Every day | ORAL | 0 refills | Status: DC

## 2016-06-30 MED FILL — BUPROPION HCL SR 200 MG TAB: 200 | 30 days supply | Qty: 30 | Fill #0

## 2016-06-30 NOTE — Progress Notes (Signed)
Office: [phone]  /  Fax: [phone]   HPI:   Chief Complaint: OBESITY Brenda Russo is here to discuss her progress with her obesity treatment plan. She is on the  follow the Category 2 plan and is following her eating plan approximately 75 % of the time. She states she is exercising 0 minutes 0 times per week. Brenda Russo continues to do well with weight loss. She states she has had increased level of stress and thus has been having more cravings. She would like to start cardio exercise about twice weekly. Her weight is 196 lb (88.9 kg) today and has had a weight gain of 1 pound over a period of 2 weeks since her last visit. She has lost 4 lbs since starting treatment with [name].  Depression with emotional eating behaviors Brenda Russo has had increased stress at work. Her mood is stable. She istruggles with emotional eating and using food for comfort to the extent that it is negatively impacting her health. She often snacks when she is not hungry. Brenda Russo sometimes feels she is out of control and then feels guilty that she made poor food choices. She has been working on behavior modification techniques to help reduce her emotional eating and has been somewhat successful. She shows no sign of suicidal or homicidal ideations.   Depression screen [facility] 2/9 05/17/2016 04/12/2016 12/22/2015 11/21/2015 11/18/2015  Decreased Interest 0 0 0 0 0  Down, Depressed, Hopeless 1 0 0 0 0  PHQ - 2 Score 1 0 0 0 0  Altered sleeping 2 - - - -  Tired, decreased energy 1 - - - -  Change in appetite 2 - - - -  Feeling bad or failure about yourself  0 - - - -  Trouble concentrating 2 - - - -  Moving slowly or fidgety/restless 0 - - - -  Suicidal thoughts 0 - - - -  PHQ-9 Score 8 - - - -      ALLERGIES: Allergies  Allergen Reactions  . Eggs Or Egg-Derived Products     Shortness of breath, wheezing    MEDICATIONS: Current Outpatient Prescriptions on File Prior to Visit  Medication Sig Dispense Refill  .  albuterol (PROVENTIL) (2.5 MG/3ML) 0.083% nebulizer solution Take 3 mLs (2.5 mg total) by nebulization 3 (three) times daily as needed. 75 mL 6  . albuterol (VENTOLIN HFA) 108 (90 Base) MCG/ACT inhaler Inhale 1-2 puffs into the lungs every 4 (four) hours as needed for wheezing. 18 g 5  . amLODipine (NORVASC) 5 MG tablet Take 1 tablet (5 mg total) by mouth daily. (Patient taking differently: Take 2.5 mg by mouth daily. ) 30 tablet 5  . Ascorbic Acid (VITAMIN C) 1000 MG tablet Take 1,000 mg by mouth daily.    . B Complex-C-Folic Acid (B-COMPLEX BALANCED PO) Take by mouth.    . Cholecalciferol (VITAMIN D3) 5000 UNITS CAPS Take 1 capsule by mouth daily.    . Coenzyme Q10 (COQ10) 100 MG CAPS Take 1 capsule by mouth daily.    . fluticasone (FLONASE) 50 MCG/ACT nasal spray Place 2 sprays into both nostrils daily. 16 g 5  . losartan-hydrochlorothiazide (HYZAAR) 100-12.5 MG tablet Take 1 tablet by mouth daily. (Patient taking differently: Take 0.5 tablets by mouth daily. ) 30 tablet 5  . magnesium oxide (MAG-OX) 400 MG tablet 2 tablets daily. 400-800 mg daily    . Multiple Minerals-Vitamins (CALCIUM CITRATE PLUS/MAGNESIUM) TABS Take 1 tablet by mouth.    . Multiple Vitamins-Minerals (COMPLETE  WOMENS PO) Take 1 capsule by mouth 2 (two) times daily.      . Omega-3 Fatty Acids (FISH OIL) 1000 MG CAPS Take 1 capsule by mouth.    . pantoprazole (PROTONIX) 40 MG tablet Take 1 tablet (40 mg total) by mouth daily. 30 minutes before the last meal of the day 90 tablet 3   No current facility-administered medications on file prior to visit.     PAST MEDICAL HISTORY: Past Medical History:  Diagnosis Date  . Allergic rhinitis   . Anemia   . Anxiety   . Asthma   . HTN (hypertension)   . Hyperlipidemia    borderline  . Obese   . OSA (obstructive sleep apnea)    mild ,no CPAP  . Overweight(278.02)   . Plantar wart    history  . Prediabetes   . Shortness of breath     PAST SURGICAL HISTORY: Past  Surgical History:  Procedure Laterality Date  . BREAST BIOPSY Right 09/02/2015  . CESAREAN SECTION    . TUBAL LIGATION      SOCIAL HISTORY: Social History  Substance Use Topics  . Smoking status: Never Smoker  . Smokeless tobacco: Never Used  . Alcohol use No    FAMILY HISTORY: Family History  Problem Relation Age of Onset  . Heart disease Mother        pacemaker   . Hypertension Mother   . Hyperlipidemia Mother   . Obesity Mother   . Asthma Father   . Pancreatic cancer Brother   . Breast cancer Maternal Aunt   . Colon cancer Neg Hx     ROS: Review of Systems  Constitutional: Positive for weight loss.  Psychiatric/Behavioral: Positive for depression. Negative for suicidal ideas.       Stress    PHYSICAL EXAM: Blood pressure 114/71, pulse 60, temperature 97.9 F (36.6 C), temperature source Oral, height 5\' 5"  (1.651 m), weight 196 lb (88.9 kg), SpO2 96 %. Body mass index is 32.62 kg/m. Physical Exam  Constitutional: She is oriented to person, place, and time. She appears well-developed and well-nourished.  Cardiovascular: Normal rate.   Pulmonary/Chest: Effort normal.  Musculoskeletal: Normal range of motion.  Neurological: She is oriented to person, place, and time.  Skin: Skin is warm and dry.  Vitals reviewed.   RECENT LABS AND TESTS: BMET    Component Value Date/Time   NA 143 05/17/2016 0949   K 4.3 05/17/2016 0949   CL 103 05/17/2016 0949   CO2 27 05/17/2016 0949   GLUCOSE 98 05/17/2016 0949   GLUCOSE 100 (H) 06/03/2015 0747   BUN 18 05/17/2016 0949   CREATININE 0.76 05/17/2016 0949   CALCIUM 9.0 05/17/2016 0949   GFRNONAA 86 05/17/2016 0949   GFRAA 99 05/17/2016 0949   Lab Results  Component Value Date   HGBA1C 5.6 05/17/2016   HGBA1C 5.9 09/22/2015   HGBA1C 6.2 06/03/2015   HGBA1C 6.0 05/21/2014   Lab Results  Component Value Date   INSULIN 9.8 05/17/2016   CBC    Component Value Date/Time   WBC 5.8 05/17/2016 0949   WBC 6.5  11/13/2012 1003   RBC 4.83 05/17/2016 0949   RBC 5.03 11/13/2012 1003   HGB 14.3 11/13/2012 1003   HCT 42.4 05/17/2016 0949   PLT 268.0 11/13/2012 1003   MCV 88 05/17/2016 0949   MCH 28.4 05/17/2016 0949   MCH 26.9 (A) 05/20/2012 1404   MCHC 32.3 05/17/2016 0949   MCHC 33.5 11/13/2012 1003  RDW 14.2 05/17/2016 0949   LYMPHSABS 1.7 05/17/2016 0949   MONOABS 0.4 11/13/2012 1003   EOSABS 0.2 05/17/2016 0949   BASOSABS 0.0 05/17/2016 0949   Iron/TIBC/Ferritin/ %Sat    Component Value Date/Time   IRON 88 05/20/2012 1422   TIBC 303 05/20/2012 1422   FERRITIN 80 05/20/2012 1422   IRONPCTSAT 29 05/20/2012 1422   Lipid Panel     Component Value Date/Time   CHOL 162 05/17/2016 0949   TRIG 50 05/17/2016 0949   HDL 57 05/17/2016 0949   CHOLHDL 3 06/03/2015 0747   VLDL 9.0 06/03/2015 0747   LDLCALC 95 05/17/2016 0949   Hepatic Function Panel     Component Value Date/Time   PROT 6.7 05/17/2016 0949   ALBUMIN 3.8 05/17/2016 0949   AST 18 05/17/2016 0949   ALT 16 05/17/2016 0949   ALKPHOS 92 05/17/2016 0949   BILITOT 0.6 05/17/2016 0949   BILIDIR 0.1 11/13/2012 1003      Component Value Date/Time   TSH 1.770 05/17/2016 0949   TSH 1.23 11/13/2012 1003   TSH 2.26 11/30/2011 1210    ASSESSMENT AND PLAN: Other depression - Plan: buPROPion (WELLBUTRIN SR) 200 MG 12 hr tablet  Class 1 obesity without serious comorbidity with body mass index (BMI) of 32.0 to 32.9 in adult, unspecified obesity type  PLAN:       Obesity Brenda Russo is currently in the action stage of change. As such, her goal is to continue with weight loss efforts She has agreed to follow the Category 2 plan Brenda Russo has been instructed to work up to a goal of 150 minutes of combined cardio and strengthening exercise per week for weight loss and overall health benefits. We discussed the following Behavioral Modification Strategies today: increasing lean protein intake  Brenda Russo has agreed to follow up  with our clinic in 3 weeks. She was informed of the importance of frequent follow up visits to maximize her success with intensive lifestyle modifications for her multiple health conditions.  I, Doreene Nest, am acting as scribe for Dennard Nip, MD  I have reviewed the above documentation for accuracy and completeness, and I agree with the above. -Dennard Nip, MD  OBESITY BEHAVIORAL INTERVENTION VISIT  Today's visit was # 4 out of 22.  Starting weight: 200 lbs Starting date: 05/17/16 Todays weight : 196 lbs Total lbs lost to date: 4 lbs (Patients must lose 7 lbs in the first 6 months to continue with counseling)   ASK: We discussed the diagnosis of obesity with Brenda Russo today and Brenda Russo agreed to give [name] permission to discuss obesity behavioral modification therapy today.  ASSESS: Brenda Russo has the diagnosis of obesity and her BMI today is 32.7 Brenda Russo is in the action stage of change   ADVISE: Brenda Russo was educated on the multiple health risks of obesity as well as the benefit of weight loss to improve her health. She was advised of the need for long term treatment and the importance of lifestyle modifications.  AGREE: Multiple dietary modification options and treatment options were discussed and  Brenda Russo agreed to follow the Category 2 plan We discussed the following Behavioral Modification Strategies today: increasing lean protein intake

## 2016-07-01 ENCOUNTER — Other Ambulatory Visit (INDEPENDENT_AMBULATORY_CARE_PROVIDER_SITE_OTHER): Payer: Self-pay | Admitting: Family Medicine

## 2016-07-01 ENCOUNTER — Encounter (INDEPENDENT_AMBULATORY_CARE_PROVIDER_SITE_OTHER): Payer: Self-pay | Admitting: Family Medicine

## 2016-07-01 DIAGNOSIS — F3289 Other specified depressive episodes: Secondary | ICD-10-CM

## 2016-07-18 ENCOUNTER — Ambulatory Visit (INDEPENDENT_AMBULATORY_CARE_PROVIDER_SITE_OTHER): Payer: 59 | Admitting: Family Medicine

## 2016-07-18 VITALS — BP 119/71 | HR 74 | Temp 97.9°F | Ht 65.0 in | Wt 188.0 lb

## 2016-07-18 DIAGNOSIS — Z6831 Body mass index (BMI) 31.0-31.9, adult: Secondary | ICD-10-CM | POA: Diagnosis not present

## 2016-07-18 DIAGNOSIS — F3289 Other specified depressive episodes: Secondary | ICD-10-CM

## 2016-07-18 DIAGNOSIS — E669 Obesity, unspecified: Secondary | ICD-10-CM

## 2016-07-18 MED ORDER — BUPROPION HCL ER (SR) 200 MG PO TB12: 200.0000 mg | ORAL_TABLET | Freq: Every day | ORAL | 0 refills | Status: DC

## 2016-07-18 NOTE — Progress Notes (Signed)
Office: [phone]  /  Fax: [phone]   HPI:   Chief Complaint: OBESITY Brenda Russo is here to discuss her progress with her obesity treatment plan. She is on the  follow the Category 2 plan and is following her eating plan approximately 70 % of the time. She states she is exercising 0 minutes 0 times per week. Brenda Russo continues to do well with weight loss. She had been sick last week and unable to eat all her food, especially her protein. She will be travelling to [place] and is worried about weight gain. Her weight is 188 lb (85.3 kg) today and has had a weight loss of 8 pounds over a period of 2 to 3 weeks since her last visit. She has lost 12 lbs since starting treatment with [name].  Depression with emotional eating behaviors Brenda Russo started wellbutrin and notes decreased emotional eating and feels more in control of her cravings. She has no irritability or insomnia and blood pressure is stable. Brenda Russo struggles with emotional eating and using food for comfort to the extent that it is negatively impacting her health. She often snacks when she is not hungry. Brenda Russo sometimes feels she is out of control and then feels guilty that she made poor food choices. She has been working on behavior modification techniques to help reduce her emotional eating and has been somewhat successful. She shows no sign of suicidal or homicidal ideations.  Depression screen [facility] 2/9 05/17/2016 04/12/2016 12/22/2015 11/21/2015 11/18/2015  Decreased Interest 0 0 0 0 0  Down, Depressed, Hopeless 1 0 0 0 0  PHQ - 2 Score 1 0 0 0 0  Altered sleeping 2 - - - -  Tired, decreased energy 1 - - - -  Change in appetite 2 - - - -  Feeling bad or failure about yourself  0 - - - -  Trouble concentrating 2 - - - -  Moving slowly or fidgety/restless 0 - - - -  Suicidal thoughts 0 - - - -  PHQ-9 Score 8 - - - -      ALLERGIES: Allergies  Allergen Reactions  . Eggs Or Egg-Derived Products     Shortness of breath,  wheezing    MEDICATIONS: Current Outpatient Prescriptions on File Prior to Visit  Medication Sig Dispense Refill  . albuterol (PROVENTIL) (2.5 MG/3ML) 0.083% nebulizer solution Take 3 mLs (2.5 mg total) by nebulization 3 (three) times daily as needed. 75 mL 6  . albuterol (VENTOLIN HFA) 108 (90 Base) MCG/ACT inhaler Inhale 1-2 puffs into the lungs every 4 (four) hours as needed for wheezing. 18 g 5  . amLODipine (NORVASC) 5 MG tablet Take 1 tablet (5 mg total) by mouth daily. (Patient taking differently: Take 2.5 mg by mouth daily. ) 30 tablet 5  . Ascorbic Acid (VITAMIN C) 1000 MG tablet Take 1,000 mg by mouth daily.    . B Complex-C-Folic Acid (B-COMPLEX BALANCED PO) Take by mouth.    . Cholecalciferol (VITAMIN D3) 5000 UNITS CAPS Take 1 capsule by mouth daily.    . Coenzyme Q10 (COQ10) 100 MG CAPS Take 1 capsule by mouth daily.    . fluticasone (FLONASE) 50 MCG/ACT nasal spray Place 2 sprays into both nostrils daily. 16 g 5  . losartan-hydrochlorothiazide (HYZAAR) 100-12.5 MG tablet Take 1 tablet by mouth daily. (Patient taking differently: Take 0.5 tablets by mouth daily. ) 30 tablet 5  . magnesium oxide (MAG-OX) 400 MG tablet 2 tablets daily. 400-800 mg daily    .  Multiple Minerals-Vitamins (CALCIUM CITRATE PLUS/MAGNESIUM) TABS Take 1 tablet by mouth.    . Multiple Vitamins-Minerals (COMPLETE WOMENS PO) Take 1 capsule by mouth 2 (two) times daily.      . Omega-3 Fatty Acids (FISH OIL) 1000 MG CAPS Take 1 capsule by mouth.    . pantoprazole (PROTONIX) 40 MG tablet Take 1 tablet (40 mg total) by mouth daily. 30 minutes before the last meal of the day 90 tablet 3   No current facility-administered medications on file prior to visit.     PAST MEDICAL HISTORY: Past Medical History:  Diagnosis Date  . Allergic rhinitis   . Anemia   . Anxiety   . Asthma   . HTN (hypertension)   . Hyperlipidemia    borderline  . Obese   . OSA (obstructive sleep apnea)    mild ,no CPAP  .  Overweight(278.02)   . Plantar wart    history  . Prediabetes   . Shortness of breath     PAST SURGICAL HISTORY: Past Surgical History:  Procedure Laterality Date  . BREAST BIOPSY Right 09/02/2015  . CESAREAN SECTION    . TUBAL LIGATION      SOCIAL HISTORY: Social History  Substance Use Topics  . Smoking status: Never Smoker  . Smokeless tobacco: Never Used  . Alcohol use No    FAMILY HISTORY: Family History  Problem Relation Age of Onset  . Heart disease Mother        pacemaker   . Hypertension Mother   . Hyperlipidemia Mother   . Obesity Mother   . Asthma Father   . Pancreatic cancer Brother   . Breast cancer Maternal Aunt   . Colon cancer Neg Hx     ROS: Review of Systems  Constitutional: Positive for weight loss.  Psychiatric/Behavioral: Positive for depression. Negative for suicidal ideas. The patient does not have insomnia.     PHYSICAL EXAM: Blood pressure 119/71, pulse 74, temperature 97.9 F (36.6 C), temperature source Oral, height 5\' 5"  (1.651 m), weight 188 lb (85.3 kg), SpO2 97 %. Body mass index is 31.28 kg/m. Physical Exam  Constitutional: She is oriented to person, place, and time. She appears well-developed and well-nourished.  Cardiovascular: Normal rate.   Pulmonary/Chest: Effort normal.  Neurological: She is oriented to person, place, and time.  Skin: Skin is warm and dry.  Psychiatric: She has a normal mood and affect. Her behavior is normal.  Vitals reviewed.   RECENT LABS AND TESTS: BMET    Component Value Date/Time   NA 143 05/17/2016 0949   K 4.3 05/17/2016 0949   CL 103 05/17/2016 0949   CO2 27 05/17/2016 0949   GLUCOSE 98 05/17/2016 0949   GLUCOSE 100 (H) 06/03/2015 0747   BUN 18 05/17/2016 0949   CREATININE 0.76 05/17/2016 0949   CALCIUM 9.0 05/17/2016 0949   GFRNONAA 86 05/17/2016 0949   GFRAA 99 05/17/2016 0949   Lab Results  Component Value Date   HGBA1C 5.6 05/17/2016   HGBA1C 5.9 09/22/2015   HGBA1C 6.2  06/03/2015   HGBA1C 6.0 05/21/2014   Lab Results  Component Value Date   INSULIN 9.8 05/17/2016   CBC    Component Value Date/Time   WBC 5.8 05/17/2016 0949   WBC 6.5 11/13/2012 1003   RBC 4.83 05/17/2016 0949   RBC 5.03 11/13/2012 1003   HGB 13.7 05/17/2016 0949   HCT 42.4 05/17/2016 0949   PLT 268.0 11/13/2012 1003   MCV 88 05/17/2016 0949  MCH 28.4 05/17/2016 0949   MCH 26.9 (A) 05/20/2012 1404   MCHC 32.3 05/17/2016 0949   MCHC 33.5 11/13/2012 1003   RDW 14.2 05/17/2016 0949   LYMPHSABS 1.7 05/17/2016 0949   MONOABS 0.4 11/13/2012 1003   EOSABS 0.2 05/17/2016 0949   BASOSABS 0.0 05/17/2016 0949   Iron/TIBC/Ferritin/ %Sat    Component Value Date/Time   IRON 88 05/20/2012 1422   TIBC 303 05/20/2012 1422   FERRITIN 80 05/20/2012 1422   IRONPCTSAT 29 05/20/2012 1422   Lipid Panel     Component Value Date/Time   CHOL 162 05/17/2016 0949   TRIG 50 05/17/2016 0949   HDL 57 05/17/2016 0949   CHOLHDL 3 06/03/2015 0747   VLDL 9.0 06/03/2015 0747   LDLCALC 95 05/17/2016 0949   Hepatic Function Panel     Component Value Date/Time   PROT 6.7 05/17/2016 0949   ALBUMIN 3.8 05/17/2016 0949   AST 18 05/17/2016 0949   ALT 16 05/17/2016 0949   ALKPHOS 92 05/17/2016 0949   BILITOT 0.6 05/17/2016 0949   BILIDIR 0.1 11/13/2012 1003      Component Value Date/Time   TSH 1.770 05/17/2016 0949   TSH 1.23 11/13/2012 1003   TSH 2.26 11/30/2011 1210    ASSESSMENT AND PLAN: Other depression - Plan: buPROPion (WELLBUTRIN SR) 200 MG 12 hr tablet  Class 1 obesity without serious comorbidity with body mass index (BMI) of 31.0 to 31.9 in adult, unspecified obesity type  PLAN:  Depression with Emotional Eating Behaviors We discussed behavior modification techniques today to help Brenda Russo deal with her emotional eating and depression. She has agreed to continue to take Wellbutrin SR 200 mg qd, we will refill for 1 month and she agreed to follow up as  directed.  Obesity Brenda Russo is currently in the action stage of change. As such, her goal is to continue with weight loss efforts She has agreed to follow the Category 2 plan Brenda Russo has been instructed to work up to a goal of 150 minutes of combined cardio and strengthening exercise per week for weight loss and overall health benefits. We discussed the following Behavioral Modification Strategies today: no skipping meals, increasing lean protein intake and travel eating strategies   Brenda Russo has agreed to follow up with our clinic in 2 weeks. She was informed of the importance of frequent follow up visits to maximize her success with intensive lifestyle modifications for her multiple health conditions.  I, Brenda Russo, am acting as scribe for Dennard Nip, MD  I have reviewed the above documentation for accuracy and completeness, and I agree with the above. -Dennard Nip, MD  OBESITY BEHAVIORAL INTERVENTION VISIT  Today's visit was # 5 out of 22.  Starting weight: 200 lbs Starting date: 05/17/16 Today's weight : 188 lbs Today's date: 07/18/2016 Total lbs lost to date: 12 (Patients must lose 7 lbs in the first 6 months to continue with counseling)   ASK: We discussed the diagnosis of obesity with [name] today and Nazanin agreed to give [name] permission to discuss obesity behavioral modification therapy today.  ASSESS: Steffi has the diagnosis of obesity and her BMI today is 31.4 Jaylei is in the action stage of change   ADVISE: Ngina was educated on the multiple health risks of obesity as well as the benefit of weight loss to improve her health. She was advised of the need for long term treatment and the importance of lifestyle modifications.  AGREE: Multiple dietary modification options and  treatment options were discussed and  Floreen agreed to follow the Category 2 plan We discussed the following Behavioral Modification Strategies today: no skipping  meals, increasing lean protein intake and travel eating strategies

## 2016-08-02 ENCOUNTER — Encounter: Payer: Self-pay | Admitting: Internal Medicine

## 2016-08-02 ENCOUNTER — Other Ambulatory Visit: Payer: Self-pay | Admitting: Internal Medicine

## 2016-08-02 MED FILL — LOSARTAN-HCTZ 100-12.5 MG T: 100-12.5 | 90 days supply | Qty: 45 | Fill #0

## 2016-08-02 MED FILL — AMLODIPINE BESYLATE 5 MG TA: 5 | 90 days supply | Qty: 45 | Fill #0

## 2016-08-02 MED FILL — FLUTICASONE PROP 50 MCG SPR: 50 | 30 days supply | Qty: 16 | Fill #1

## 2016-08-02 MED FILL — BUPROPION HCL SR 200 MG TAB: 200 | 30 days supply | Qty: 30 | Fill #0

## 2016-08-02 NOTE — Telephone Encounter (Signed)
Okay to refill albuterol and Breo Ellipta , history of asthma

## 2016-08-04 MED ORDER — ALBUTEROL SULFATE HFA 108 (90 BASE) MCG/ACT IN AERS: 1.0000 | INHALATION_SPRAY | RESPIRATORY_TRACT | 1 refills | Status: DC | PRN

## 2016-08-04 MED ORDER — FLUTICASONE FUROATE-VILANTEROL 200-25 MCG/INH IN AEPB: 1.0000 | INHALATION_SPRAY | Freq: Every day | RESPIRATORY_TRACT | 1 refills | Status: DC

## 2016-08-04 MED FILL — VENTOLIN HFA 90 MCG INHALER: 108 (90 BAS | 90 days supply | Qty: 54 | Fill #0

## 2016-08-04 MED FILL — BREO ELLIPTA 200-25 MCG INH: 200-25 | 90 days supply | Qty: 180 | Fill #0

## 2016-08-04 NOTE — Telephone Encounter (Signed)
Rx's sent to [facility] for 90 day supplies.

## 2016-08-08 ENCOUNTER — Ambulatory Visit (INDEPENDENT_AMBULATORY_CARE_PROVIDER_SITE_OTHER): Payer: 59 | Admitting: Family Medicine

## 2016-08-08 VITALS — BP 131/87 | HR 61 | Temp 97.4°F | Ht 65.0 in | Wt 191.0 lb

## 2016-08-08 DIAGNOSIS — F3289 Other specified depressive episodes: Secondary | ICD-10-CM | POA: Diagnosis not present

## 2016-08-08 DIAGNOSIS — E669 Obesity, unspecified: Secondary | ICD-10-CM

## 2016-08-08 DIAGNOSIS — Z6831 Body mass index (BMI) 31.0-31.9, adult: Secondary | ICD-10-CM | POA: Diagnosis not present

## 2016-08-08 MED ORDER — BUPROPION HCL ER (SR) 150 MG PO TB12: 150.0000 mg | ORAL_TABLET | Freq: Two times a day (BID) | ORAL | 0 refills | Status: DC

## 2016-08-08 MED FILL — BUPROPION SR 150 MG TABLET: 150 | 30 days supply | Qty: 60 | Fill #0

## 2016-08-08 NOTE — Progress Notes (Signed)
Office: [phone]  /  Fax: [phone]   HPI:   Chief Complaint: OBESITY Brenda Russo is here to discuss her progress with her obesity treatment plan. She is on the  follow the Category 2 plan and is following her eating plan approximately 60 % of the time. She states she is exercising 0 minutes 0 times per week. Brenda Russo was on vacation and increased eating out. She had lots of family sabotage and is getting ready to go to [place] tomorrow and is worried about additional weight gain. Her weight is 191 lb (86.6 kg) today and has had a weight gain of 3 since her last visit. She has gained 3 pounds over a period of 3 weeks since last visit. She has lost 9 lbs since starting treatment with [name].  Depression with emotional eating behaviors [name]'s mood is stable but she is still struggling with feeling irritable and deprived. Brenda Russo struggles with emotional eating and using food for comfort to the extent that it is negatively impacting her health. She often snacks when she is not hungry. Brenda Russo sometimes feels she is out of control and then feels guilty that she made poor food choices. She has been working on behavior modification techniques to help reduce her emotional eating and has been somewhat successful. She shows no sign of suicidal or homicidal ideations.  Depression screen [facility] 2/9 05/17/2016 04/12/2016 12/22/2015 11/21/2015 11/18/2015  Decreased Interest 0 0 0 0 0  Down, Depressed, Hopeless 1 0 0 0 0  PHQ - 2 Score 1 0 0 0 0  Altered sleeping 2 - - - -  Tired, decreased energy 1 - - - -  Change in appetite 2 - - - -  Feeling bad or failure about yourself  0 - - - -  Trouble concentrating 2 - - - -  Moving slowly or fidgety/restless 0 - - - -  Suicidal thoughts 0 - - - -  PHQ-9 Score 8 - - - -      ALLERGIES: Allergies  Allergen Reactions  . Eggs Or Egg-Derived Products     Shortness of breath, wheezing    MEDICATIONS: Current Outpatient Prescriptions on File Prior to  Visit  Medication Sig Dispense Refill  . albuterol (PROVENTIL) (2.5 MG/3ML) 0.083% nebulizer solution Take 3 mLs (2.5 mg total) by nebulization 3 (three) times daily as needed. 75 mL 6  . albuterol (VENTOLIN HFA) 108 (90 Base) MCG/ACT inhaler Inhale 1-2 puffs into the lungs every 4 (four) hours as needed for wheezing or shortness of breath. 54 g 1  . amLODipine (NORVASC) 5 MG tablet Take 0.5 tablets (2.5 mg total) by mouth daily. 15 tablet 8  . Ascorbic Acid (VITAMIN C) 1000 MG tablet Take 1,000 mg by mouth daily.    . B Complex-C-Folic Acid (B-COMPLEX BALANCED PO) Take by mouth.    . Cholecalciferol (VITAMIN D3) 5000 UNITS CAPS Take 1 capsule by mouth daily.    . Coenzyme Q10 (COQ10) 100 MG CAPS Take 1 capsule by mouth daily.    . fluticasone (FLONASE) 50 MCG/ACT nasal spray Place 2 sprays into both nostrils daily. 16 g 5  . fluticasone furoate-vilanterol (BREO ELLIPTA) 200-25 MCG/INH AEPB Inhale 1 puff into the lungs daily. 180 each 1  . losartan-hydrochlorothiazide (HYZAAR) 100-12.5 MG tablet Take 0.5 tablets by mouth daily. 15 tablet 8  . magnesium oxide (MAG-OX) 400 MG tablet 2 tablets daily. 400-800 mg daily    . Multiple Minerals-Vitamins (CALCIUM CITRATE PLUS/MAGNESIUM) TABS Take 1 tablet by  mouth.    . Multiple Vitamins-Minerals (COMPLETE WOMENS PO) Take 1 capsule by mouth 2 (two) times daily.      . Omega-3 Fatty Acids (FISH OIL) 1000 MG CAPS Take 1 capsule by mouth.    . pantoprazole (PROTONIX) 40 MG tablet Take 1 tablet (40 mg total) by mouth daily. 30 minutes before the last meal of the day 90 tablet 3   No current facility-administered medications on file prior to visit.     PAST MEDICAL HISTORY: Past Medical History:  Diagnosis Date  . Allergic rhinitis   . Anemia   . Anxiety   . Asthma   . HTN (hypertension)   . Hyperlipidemia    borderline  . Obese   . OSA (obstructive sleep apnea)    mild ,no CPAP  . Overweight(278.02)   . Plantar wart    history  . Prediabetes    . Shortness of breath     PAST SURGICAL HISTORY: Past Surgical History:  Procedure Laterality Date  . BREAST BIOPSY Right 09/02/2015  . CESAREAN SECTION    . TUBAL LIGATION      SOCIAL HISTORY: Social History  Substance Use Topics  . Smoking status: Never Smoker  . Smokeless tobacco: Never Used  . Alcohol use No    FAMILY HISTORY: Family History  Problem Relation Age of Onset  . Heart disease Mother        pacemaker   . Hypertension Mother   . Hyperlipidemia Mother   . Obesity Mother   . Asthma Father   . Pancreatic cancer Brother   . Breast cancer Maternal Aunt   . Colon cancer Neg Hx     ROS: Review of Systems  Constitutional: Negative for weight loss.  Psychiatric/Behavioral: Positive for depression. Negative for suicidal ideas.    PHYSICAL EXAM: Blood pressure 131/87, pulse 61, temperature 97.4 F (36.3 C), temperature source Oral, height 5\' 5"  (1.651 m), weight 191 lb (86.6 kg), SpO2 99 %. Body mass index is 31.78 kg/m. Physical Exam  Constitutional: She is oriented to person, place, and time. She appears well-developed and well-nourished.  Cardiovascular: Normal rate.   Pulmonary/Chest: Effort normal.  Musculoskeletal: Normal range of motion.  Neurological: She is oriented to person, place, and time.  Skin: Skin is warm and dry.  Vitals reviewed.   RECENT LABS AND TESTS: BMET    Component Value Date/Time   NA 143 05/17/2016 0949   K 4.3 05/17/2016 0949   CL 103 05/17/2016 0949   CO2 27 05/17/2016 0949   GLUCOSE 98 05/17/2016 0949   GLUCOSE 100 (H) 06/03/2015 0747   BUN 18 05/17/2016 0949   CREATININE 0.76 05/17/2016 0949   CALCIUM 9.0 05/17/2016 0949   GFRNONAA 86 05/17/2016 0949   GFRAA 99 05/17/2016 0949   Lab Results  Component Value Date   HGBA1C 5.6 05/17/2016   HGBA1C 5.9 09/22/2015   HGBA1C 6.2 06/03/2015   HGBA1C 6.0 05/21/2014   Lab Results  Component Value Date   INSULIN 9.8 05/17/2016   CBC    Component Value  Date/Time   WBC 5.8 05/17/2016 0949   WBC 6.5 11/13/2012 1003   RBC 4.83 05/17/2016 0949   RBC 5.03 11/13/2012 1003   HGB 13.7 05/17/2016 0949   HCT 42.4 05/17/2016 0949   PLT 268.0 11/13/2012 1003   MCV 88 05/17/2016 0949   MCH 28.4 05/17/2016 0949   MCH 26.9 (A) 05/20/2012 1404   MCHC 32.3 05/17/2016 0949   MCHC 33.5 11/13/2012  1003   RDW 14.2 05/17/2016 0949   LYMPHSABS 1.7 05/17/2016 0949   MONOABS 0.4 11/13/2012 1003   EOSABS 0.2 05/17/2016 0949   BASOSABS 0.0 05/17/2016 0949   Iron/TIBC/Ferritin/ %Sat    Component Value Date/Time   IRON 88 05/20/2012 1422   TIBC 303 05/20/2012 1422   FERRITIN 80 05/20/2012 1422   IRONPCTSAT 29 05/20/2012 1422   Lipid Panel     Component Value Date/Time   CHOL 162 05/17/2016 0949   TRIG 50 05/17/2016 0949   HDL 57 05/17/2016 0949   CHOLHDL 3 06/03/2015 0747   VLDL 9.0 06/03/2015 0747   LDLCALC 95 05/17/2016 0949   Hepatic Function Panel     Component Value Date/Time   PROT 6.7 05/17/2016 0949   ALBUMIN 3.8 05/17/2016 0949   AST 18 05/17/2016 0949   ALT 16 05/17/2016 0949   ALKPHOS 92 05/17/2016 0949   BILITOT 0.6 05/17/2016 0949   BILIDIR 0.1 11/13/2012 1003      Component Value Date/Time   TSH 1.770 05/17/2016 0949   TSH 1.23 11/13/2012 1003   TSH 2.26 11/30/2011 1210    ASSESSMENT AND PLAN: Other depression - Plan: buPROPion (WELLBUTRIN SR) 150 MG 12 hr tablet  Class 1 obesity with serious comorbidity and body mass index (BMI) of 31.0 to 31.9 in adult, unspecified obesity type  PLAN:  Depression with Emotional Eating Behaviors We discussed behavior modification techniques today to help Ahriana deal with her emotional eating and depression. She has agreed to increase Wellbutrin SR 150 mg bid #60 with no refills and she agreed to follow up as directed.  Obesity Blaike is currently in the action stage of change. As such, her goal is to continue with weight loss efforts She has agreed to follow the Category 2  plan Syann has been instructed to work up to a goal of 150 minutes of combined cardio and strengthening exercise per week for weight loss and overall health benefits. We discussed the following Behavioral Modification Strategies today: increase H2O intake, increasing lean protein intake, decreasing simple carbohydrates , dealing with family or coworker sabotage, holiday eating strategies, travel eating strategies and emotional eating strategies  Suzzane has agreed to follow up with our clinic in 2 to 3 weeks. She was informed of the importance of frequent follow up visits to maximize her success with intensive lifestyle modifications for her multiple health conditions.  I, Doreene Nest, am acting as transcriptionist for Dennard Nip, MD  I have reviewed the above documentation for accuracy and completeness, and I agree with the above. -Dennard Nip, MD  OBESITY BEHAVIORAL INTERVENTION VISIT  Today's visit was # 6 out of 22.  Starting weight: 200 lbs Starting date: 05/17/16 Today's weight : 191 lbs Today's date: 08/08/2016 Total lbs lost to date: 9 (Patients must lose 7 lbs in the first 6 months to continue with counseling)   ASK: We discussed the diagnosis of obesity with [name] today and Tierra agreed to give [name] permission to discuss obesity behavioral modification therapy today.  ASSESS: Nikkole has the diagnosis of obesity and her BMI today is 31.9 Telia is in the action stage of change   ADVISE: Jalene was educated on the multiple health risks of obesity as well as the benefit of weight loss to improve her health. She was advised of the need for long term treatment and the importance of lifestyle modifications.  AGREE: Multiple dietary modification options and treatment options were discussed and  Catilyn agreed to  follow the Category 2 plan We discussed the following Behavioral Modification Strategies today: increase H2O intake, increasing lean protein  intake, decreasing simple carbohydrates , dealing with family or coworker sabotage, holiday eating strategies travel eating strategies and emotional eating strategies

## 2016-08-30 ENCOUNTER — Ambulatory Visit (INDEPENDENT_AMBULATORY_CARE_PROVIDER_SITE_OTHER): Payer: 59 | Admitting: Family Medicine

## 2016-08-30 VITALS — BP 144/84 | HR 61 | Temp 98.0°F | Ht 65.0 in | Wt 195.0 lb

## 2016-08-30 DIAGNOSIS — F3289 Other specified depressive episodes: Secondary | ICD-10-CM | POA: Diagnosis not present

## 2016-08-30 DIAGNOSIS — I10 Essential (primary) hypertension: Secondary | ICD-10-CM

## 2016-08-30 DIAGNOSIS — E669 Obesity, unspecified: Secondary | ICD-10-CM

## 2016-08-30 DIAGNOSIS — Z6832 Body mass index (BMI) 32.0-32.9, adult: Secondary | ICD-10-CM

## 2016-08-30 DIAGNOSIS — F329 Major depressive disorder, single episode, unspecified: Secondary | ICD-10-CM | POA: Insufficient documentation

## 2016-08-30 DIAGNOSIS — F32A Depression, unspecified: Secondary | ICD-10-CM | POA: Insufficient documentation

## 2016-08-30 MED ORDER — BUPROPION HCL ER (SR) 200 MG PO TB12: 200.0000 mg | ORAL_TABLET | Freq: Two times a day (BID) | ORAL | 0 refills | Status: DC

## 2016-08-30 MED FILL — BUPROPION HCL SR 200 MG TAB: 200 | 30 days supply | Qty: 60 | Fill #0

## 2016-08-30 NOTE — Progress Notes (Signed)
Office: [phone]  /  Fax: [phone]   HPI:   Chief Complaint: OBESITY Brenda Russo is here to discuss her progress with her obesity treatment plan. She is on the  follow the Category 2 plan and is following her eating plan approximately 30 % of the time. She states she is walking and doing exercise classed for 45 minutes 7 times per week. Brenda Russo is on vacation and she increased celebration eating and eating out. She feels her carbohydrate cravings have gotten out of control and she is not making good choices lately. She is up 10 lbs from her lowest weight. Her weight is 195 lb (88.5 kg) today and has had a weight gain of 4 pounds over a period of 2 weeks since her last visit. She has lost 5 lbs since starting treatment with [name].  Depression with emotional eating behaviors Brenda Russo is struggling with emotional eating and using food for comfort to the extent that it is negatively impacting her health. She often snacks when she is not hungry. Brenda Russo sometimes feels she is out of control and then feels guilty that she made poor food choices. She has been working on behavior modification techniques to help reduce her emotional eating and has been somewhat successful. She is currently on Wellbutrin SR 150 mg bid. She shows no sign of suicidal or homicidal ideations.  Hypertension Brenda Russo is a 61 y.o. female with hypertension. Her blood pressure today is at 144/84.  Brenda Russo denies chest pain or shortness of breath on exertion. She is stressed that she has gained weight. She is working weight loss to help control her blood pressure with the goal of decreasing her risk of heart attack and stroke. She is taking her medications regularly. Brenda Russo blood pressure is not currently controlled.    Depression screen Brenda Russo 2/9 05/17/2016 04/12/2016 12/22/2015 11/21/2015 11/18/2015  Decreased Interest 0 0 0 0 0  Down, Depressed, Hopeless 1 0 0 0 0  PHQ - 2 Score 1 0 0 0 0  Altered  sleeping 2 - - - -  Tired, decreased energy 1 - - - -  Change in appetite 2 - - - -  Feeling bad or failure about yourself  0 - - - -  Trouble concentrating 2 - - - -  Moving slowly or fidgety/restless 0 - - - -  Suicidal thoughts 0 - - - -  PHQ-9 Score 8 - - - -     ALLERGIES: Allergies  Allergen Reactions  . Eggs Or Egg-Derived Products     Shortness of breath, wheezing    MEDICATIONS: Current Outpatient Prescriptions on File Prior to Visit  Medication Sig Dispense Refill  . albuterol (PROVENTIL) (2.5 MG/3ML) 0.083% nebulizer solution Take 3 mLs (2.5 mg total) by nebulization 3 (three) times daily as needed. 75 mL 6  . albuterol (VENTOLIN HFA) 108 (90 Base) MCG/ACT inhaler Inhale 1-2 puffs into the lungs every 4 (four) hours as needed for wheezing or shortness of breath. 54 g 1  . amLODipine (NORVASC) 5 MG tablet Take 0.5 tablets (2.5 mg total) by mouth daily. 15 tablet 8  . Ascorbic Acid (VITAMIN C) 1000 MG tablet Take 1,000 mg by mouth daily.    . B Complex-C-Folic Acid (B-COMPLEX BALANCED PO) Take by mouth.    . Cholecalciferol (VITAMIN D3) 5000 UNITS CAPS Take 1 capsule by mouth daily.    . Coenzyme Q10 (COQ10) 100 MG CAPS Take 1 capsule by mouth daily.    Marland Kitchen  fluticasone (FLONASE) 50 MCG/ACT nasal spray Place 2 sprays into both nostrils daily. 16 g 5  . fluticasone furoate-vilanterol (BREO ELLIPTA) 200-25 MCG/INH AEPB Inhale 1 puff into the lungs daily. 180 each 1  . losartan-hydrochlorothiazide (HYZAAR) 100-12.5 MG tablet Take 0.5 tablets by mouth daily. 15 tablet 8  . magnesium oxide (MAG-OX) 400 MG tablet 2 tablets daily. 400-800 mg daily    . Multiple Minerals-Vitamins (CALCIUM CITRATE PLUS/MAGNESIUM) TABS Take 1 tablet by mouth.    . Multiple Vitamins-Minerals (COMPLETE WOMENS PO) Take 1 capsule by mouth 2 (two) times daily.      . Omega-3 Fatty Acids (FISH OIL) 1000 MG CAPS Take 1 capsule by mouth.    . pantoprazole (PROTONIX) 40 MG tablet Take 1 tablet (40 mg total) by  mouth daily. 30 minutes before the last meal of the day 90 tablet 3   No current facility-administered medications on file prior to visit.     PAST MEDICAL HISTORY: Past Medical History:  Diagnosis Date  . Allergic rhinitis   . Anemia   . Anxiety   . Asthma   . HTN (hypertension)   . Hyperlipidemia    borderline  . Obese   . OSA (obstructive sleep apnea)    mild ,no CPAP  . Overweight(278.02)   . Plantar wart    history  . Prediabetes   . Shortness of breath     PAST SURGICAL HISTORY: Past Surgical History:  Procedure Laterality Date  . BREAST BIOPSY Right 09/02/2015  . CESAREAN SECTION    . TUBAL LIGATION      SOCIAL HISTORY: Social History  Substance Use Topics  . Smoking status: Never Smoker  . Smokeless tobacco: Never Used  . Alcohol use No    FAMILY HISTORY: Family History  Problem Relation Age of Onset  . Heart disease Mother        pacemaker   . Hypertension Mother   . Hyperlipidemia Mother   . Obesity Mother   . Asthma Father   . Pancreatic cancer Brother   . Breast cancer Maternal Aunt   . Colon cancer Neg Hx     ROS: Review of Systems  Constitutional: Negative for weight loss.  Respiratory: Negative for shortness of breath (on exertion).   Cardiovascular: Negative for chest pain.  Psychiatric/Behavioral: Positive for depression. Negative for suicidal ideas.       Stress     PHYSICAL EXAM: Blood pressure (!) 144/84, pulse 61, temperature 98 F (36.7 C), temperature source Oral, height 5\' 5"  (1.651 m), weight 195 lb (88.5 kg), SpO2 96 %. Body mass index is 32.45 kg/m. Physical Exam  Constitutional: She is oriented to person, place, and time. She appears well-developed and well-nourished.  Cardiovascular: Normal rate.   Pulmonary/Chest: Effort normal.  Musculoskeletal: Normal range of motion.  Neurological: She is oriented to person, place, and time.  Skin: Skin is warm and dry.  Vitals reviewed.   RECENT LABS AND TESTS: BMET      Component Value Date/Time   NA 143 05/17/2016 0949   K 4.3 05/17/2016 0949   CL 103 05/17/2016 0949   CO2 27 05/17/2016 0949   GLUCOSE 98 05/17/2016 0949   GLUCOSE 100 (H) 06/03/2015 0747   BUN 18 05/17/2016 0949   CREATININE 0.76 05/17/2016 0949   CALCIUM 9.0 05/17/2016 0949   GFRNONAA 86 05/17/2016 0949   GFRAA 99 05/17/2016 0949   Lab Results  Component Value Date   HGBA1C 5.6 05/17/2016   HGBA1C 5.9  09/22/2015   HGBA1C 6.2 06/03/2015   HGBA1C 6.0 05/21/2014   Lab Results  Component Value Date   INSULIN 9.8 05/17/2016   CBC    Component Value Date/Time   WBC 5.8 05/17/2016 0949   WBC 6.5 11/13/2012 1003   RBC 4.83 05/17/2016 0949   RBC 5.03 11/13/2012 1003   HGB 13.7 05/17/2016 0949   HCT 42.4 05/17/2016 0949   PLT 268.0 11/13/2012 1003   MCV 88 05/17/2016 0949   MCH 28.4 05/17/2016 0949   MCH 26.9 (A) 05/20/2012 1404   MCHC 32.3 05/17/2016 0949   MCHC 33.5 11/13/2012 1003   RDW 14.2 05/17/2016 0949   LYMPHSABS 1.7 05/17/2016 0949   MONOABS 0.4 11/13/2012 1003   EOSABS 0.2 05/17/2016 0949   BASOSABS 0.0 05/17/2016 0949   Iron/TIBC/Ferritin/ %Sat    Component Value Date/Time   IRON 88 05/20/2012 1422   TIBC 303 05/20/2012 1422   FERRITIN 80 05/20/2012 1422   IRONPCTSAT 29 05/20/2012 1422   Lipid Panel     Component Value Date/Time   CHOL 162 05/17/2016 0949   TRIG 50 05/17/2016 0949   HDL 57 05/17/2016 0949   CHOLHDL 3 06/03/2015 0747   VLDL 9.0 06/03/2015 0747   LDLCALC 95 05/17/2016 0949   Hepatic Function Panel     Component Value Date/Time   PROT 6.7 05/17/2016 0949   ALBUMIN 3.8 05/17/2016 0949   AST 18 05/17/2016 0949   ALT 16 05/17/2016 0949   ALKPHOS 92 05/17/2016 0949   BILITOT 0.6 05/17/2016 0949   BILIDIR 0.1 11/13/2012 1003      Component Value Date/Time   TSH 1.770 05/17/2016 0949   TSH 1.23 11/13/2012 1003   TSH 2.26 11/30/2011 1210    ASSESSMENT AND PLAN: Other depression - Plan: buPROPion (WELLBUTRIN SR) 200 MG 12  hr tablet  Essential hypertension  Class 1 obesity with serious comorbidity and body mass index (BMI) of 32.0 to 32.9 in adult, unspecified obesity type  PLAN:  Depression with Emotional Eating Behaviors We discussed behavior modification techniques today to help Brenda Russo deal with her emotional eating and depression. She has agreed to increase Wellbutrin SR 150 mg qd to 200 mg bid #60 with no refills and she agreed to follow up as directed.  Hypertension We discussed sodium restriction, working on healthy weight loss, and a regular exercise program as the means to achieve improved blood pressure control. Brenda Russo agreed to get back to diet and exercise and will follow up as directed. We will re-check blood pressure in 2 weeks and will continue to monitor her blood pressure as well as her progress with the above lifestyle modifications. She will continue her medications as prescribed and will watch for signs of hypotension as she continues her lifestyle modifications.  Obesity Brenda Russo is currently in the action stage of change. As such, her goal is to continue with weight loss efforts She has agreed to change to follow a lower carbohydrate, vegetable and lean protein rich diet plan Brenda Russo has been instructed to work up to a goal of 150 minutes of combined cardio and strengthening exercise per week for weight loss and overall health benefits. We discussed the following Behavioral Modification Strategies today: meal planning & cooking strategies, increasing lean protein intake, decreasing simple carbohydrates  and increasing vegetables  Brenda Russo has agreed to follow up with our clinic in 2 weeks. She was informed of the importance of frequent follow up visits to maximize her success with intensive lifestyle modifications for  her multiple health conditions.  I, Doreene Nest, am acting as transcriptionist for Dennard Nip, MD  I have reviewed the above documentation for accuracy and  completeness, and I agree with the above. -Dennard Nip, MD  OBESITY BEHAVIORAL INTERVENTION VISIT  Today's visit was # 7 out of 22.  Starting weight: 200 lbs Starting date: 05/17/16 Today's weight : 195 lbs Today's date: 08/30/2016 Total lbs lost to date: 5 (Patients must lose 7 lbs in the first 6 months to continue with counseling)   ASK: We discussed the diagnosis of obesity with Brenda Russo today and Tinna agreed to give [name] permission to discuss obesity behavioral modification therapy today.  ASSESS: Lunabelle has the diagnosis of obesity and her BMI today is 32.5 Devlyn is in the action stage of change   ADVISE: Rawan was educated on the multiple health risks of obesity as well as the benefit of weight loss to improve her health. She was advised of the need for long term treatment and the importance of lifestyle modifications.  AGREE: Multiple dietary modification options and treatment options were discussed and  Taniqua agreed to change to follow a lower carbohydrate, vegetable and lean protein rich diet plan We discussed the following Behavioral Modification Strategies today: meal planning & cooking strategies, increasing lean protein intake, decreasing simple carbohydrates  and increasing vegetables

## 2016-09-08 DIAGNOSIS — R928 Other abnormal and inconclusive findings on diagnostic imaging of breast: Secondary | ICD-10-CM | POA: Diagnosis not present

## 2016-09-08 LAB — HM MAMMOGRAPHY

## 2016-09-12 ENCOUNTER — Ambulatory Visit (INDEPENDENT_AMBULATORY_CARE_PROVIDER_SITE_OTHER): Payer: 59 | Admitting: Family Medicine

## 2016-09-12 VITALS — BP 130/75 | HR 66 | Temp 98.2°F | Ht 65.0 in | Wt 189.0 lb

## 2016-09-12 DIAGNOSIS — I10 Essential (primary) hypertension: Secondary | ICD-10-CM

## 2016-09-12 DIAGNOSIS — F3289 Other specified depressive episodes: Secondary | ICD-10-CM | POA: Diagnosis not present

## 2016-09-12 DIAGNOSIS — Z6831 Body mass index (BMI) 31.0-31.9, adult: Secondary | ICD-10-CM | POA: Diagnosis not present

## 2016-09-12 DIAGNOSIS — E669 Obesity, unspecified: Secondary | ICD-10-CM | POA: Diagnosis not present

## 2016-09-12 MED ORDER — BUPROPION HCL ER (SR) 200 MG PO TB12: 200.0000 mg | ORAL_TABLET | Freq: Two times a day (BID) | ORAL | 0 refills | Status: DC

## 2016-09-12 NOTE — Progress Notes (Signed)
Office: [phone]  /  Fax: [phone]   HPI:   Chief Complaint: OBESITY Brenda Russo is here to discuss her progress with her obesity treatment plan. She is on the lower carbohydrate, vegetable and lean protein rich diet plan and is following her eating plan approximately 100 % of the time. She states she is walking 7 times per week for a total of 150 minutes. Brenda Russo was changed to our lower carbohydrate plan and has done very well with weight loss. Hunger was controlled but she misses eating fruit. She is exercising by walking. Her weight is 189 lb (85.7 kg) today and has had a weight loss of 6 pounds over a period of 2 weeks since her last visit. She has lost 11 lbs since starting treatment with [name].  Hypertension Brenda Russo is a 61 y.o. female with hypertension.  Brenda Russo denies chest pain or shortness of breath on exertion. She is working weight loss to help control her blood pressure with the goal of decreasing her risk of heart attack and stroke. Alvarados blood pressure was elevated last visit but is currently controlled today.  Depression with emotional eating behaviors Brenda Russo's mood is stable on wellbutrin. She struggles with emotional eating and using food for comfort to the extent that it is negatively impacting her health. She often snacks when she is not hungry. Brenda Russo sometimes feels she is out of control and then feels guilty that she made poor food choices. She has been working on behavior modification techniques to help reduce her emotional eating and has been somewhat successful. She denies insomnia and she shows no sign of suicidal or homicidal ideations.  Depression screen [facility] 2/9 05/17/2016 04/12/2016 12/22/2015 11/21/2015 11/18/2015  Decreased Interest 0 0 0 0 0  Down, Depressed, Hopeless 1 0 0 0 0  PHQ - 2 Score 1 0 0 0 0  Altered sleeping 2 - - - -  Tired, decreased energy 1 - - - -  Change in appetite 2 - - - -  Feeling bad or failure about  yourself  0 - - - -  Trouble concentrating 2 - - - -  Moving slowly or fidgety/restless 0 - - - -  Suicidal thoughts 0 - - - -  PHQ-9 Score 8 - - - -     ALLERGIES: Allergies  Allergen Reactions  . Eggs Or Egg-Derived Products     Shortness of breath, wheezing    MEDICATIONS: Current Outpatient Prescriptions on File Prior to Visit  Medication Sig Dispense Refill  . albuterol (PROVENTIL) (2.5 MG/3ML) 0.083% nebulizer solution Take 3 mLs (2.5 mg total) by nebulization 3 (three) times daily as needed. 75 mL 6  . albuterol (VENTOLIN HFA) 108 (90 Base) MCG/ACT inhaler Inhale 1-2 puffs into the lungs every 4 (four) hours as needed for wheezing or shortness of breath. 54 g 1  . amLODipine (NORVASC) 5 MG tablet Take 0.5 tablets (2.5 mg total) by mouth daily. 15 tablet 8  . Ascorbic Acid (VITAMIN C) 1000 MG tablet Take 1,000 mg by mouth daily.    . B Complex-C-Folic Acid (B-COMPLEX BALANCED PO) Take by mouth.    . Cholecalciferol (VITAMIN D3) 5000 UNITS CAPS Take 1 capsule by mouth daily.    . Coenzyme Q10 (COQ10) 100 MG CAPS Take 1 capsule by mouth daily.    . fluticasone (FLONASE) 50 MCG/ACT nasal spray Place 2 sprays into both nostrils daily. 16 g 5  . fluticasone furoate-vilanterol (BREO ELLIPTA) 200-25 MCG/INH AEPB  Inhale 1 puff into the lungs daily. 180 each 1  . losartan-hydrochlorothiazide (HYZAAR) 100-12.5 MG tablet Take 0.5 tablets by mouth daily. 15 tablet 8  . magnesium oxide (MAG-OX) 400 MG tablet 2 tablets daily. 400-800 mg daily    . Multiple Minerals-Vitamins (CALCIUM CITRATE PLUS/MAGNESIUM) TABS Take 1 tablet by mouth.    . Multiple Vitamins-Minerals (COMPLETE WOMENS PO) Take 1 capsule by mouth 2 (two) times daily.      . Omega-3 Fatty Acids (FISH OIL) 1000 MG CAPS Take 1 capsule by mouth.    . pantoprazole (PROTONIX) 40 MG tablet Take 1 tablet (40 mg total) by mouth daily. 30 minutes before the last meal of the day 90 tablet 3   No current facility-administered medications  on file prior to visit.     PAST MEDICAL HISTORY: Past Medical History:  Diagnosis Date  . Allergic rhinitis   . Anemia   . Anxiety   . Asthma   . HTN (hypertension)   . Hyperlipidemia    borderline  . Obese   . OSA (obstructive sleep apnea)    mild ,no CPAP  . Overweight(278.02)   . Plantar wart    history  . Prediabetes   . Shortness of breath     PAST SURGICAL HISTORY: Past Surgical History:  Procedure Laterality Date  . BREAST BIOPSY Right 09/02/2015  . CESAREAN SECTION    . TUBAL LIGATION      SOCIAL HISTORY: Social History  Substance Use Topics  . Smoking status: Never Smoker  . Smokeless tobacco: Never Used  . Alcohol use No    FAMILY HISTORY: Family History  Problem Relation Age of Onset  . Heart disease Mother        pacemaker   . Hypertension Mother   . Hyperlipidemia Mother   . Obesity Mother   . Asthma Father   . Pancreatic cancer Brother   . Breast cancer Maternal Aunt   . Colon cancer Neg Hx     ROS: Review of Systems  Constitutional: Positive for weight loss.  Psychiatric/Behavioral: Positive for depression. Negative for suicidal ideas. The patient does not have insomnia.     PHYSICAL EXAM: Blood pressure 130/75, pulse 66, temperature 98.2 F (36.8 C), temperature source Oral, height 5\' 5"  (1.651 m), weight 189 lb (85.7 kg), SpO2 96 %. Body mass index is 31.45 kg/m. Physical Exam  Constitutional: She is oriented to person, place, and time. She appears well-developed and well-nourished.  Cardiovascular: Normal rate.   Pulmonary/Chest: Effort normal.  Musculoskeletal: Normal range of motion.  Neurological: She is oriented to person, place, and time.  Skin: Skin is warm and dry.  Psychiatric: She has a normal mood and affect. Her behavior is normal.  Vitals reviewed.   RECENT LABS AND TESTS: BMET    Component Value Date/Time   NA 143 05/17/2016 0949   K 4.3 05/17/2016 0949   CL 103 05/17/2016 0949   CO2 27 05/17/2016 0949     GLUCOSE 98 05/17/2016 0949   GLUCOSE 100 (H) 06/03/2015 0747   BUN 18 05/17/2016 0949   CREATININE 0.76 05/17/2016 0949   CALCIUM 9.0 05/17/2016 0949   GFRNONAA 86 05/17/2016 0949   GFRAA 99 05/17/2016 0949   Lab Results  Component Value Date   HGBA1C 5.6 05/17/2016   HGBA1C 5.9 09/22/2015   HGBA1C 6.2 06/03/2015   HGBA1C 6.0 05/21/2014   Lab Results  Component Value Date   INSULIN 9.8 05/17/2016   CBC  Component Value Date/Time   WBC 5.8 05/17/2016 0949   WBC 6.5 11/13/2012 1003   RBC 4.83 05/17/2016 0949   RBC 5.03 11/13/2012 1003   HGB 13.7 05/17/2016 0949   HCT 42.4 05/17/2016 0949   PLT 268.0 11/13/2012 1003   MCV 88 05/17/2016 0949   MCH 28.4 05/17/2016 0949   MCH 26.9 (A) 05/20/2012 1404   MCHC 32.3 05/17/2016 0949   MCHC 33.5 11/13/2012 1003   RDW 14.2 05/17/2016 0949   LYMPHSABS 1.7 05/17/2016 0949   MONOABS 0.4 11/13/2012 1003   EOSABS 0.2 05/17/2016 0949   BASOSABS 0.0 05/17/2016 0949   Iron/TIBC/Ferritin/ %Sat    Component Value Date/Time   IRON 88 05/20/2012 1422   TIBC 303 05/20/2012 1422   FERRITIN 80 05/20/2012 1422   IRONPCTSAT 29 05/20/2012 1422   Lipid Panel     Component Value Date/Time   CHOL 162 05/17/2016 0949   TRIG 50 05/17/2016 0949   HDL 57 05/17/2016 0949   CHOLHDL 3 06/03/2015 0747   VLDL 9.0 06/03/2015 0747   LDLCALC 95 05/17/2016 0949   Hepatic Function Panel     Component Value Date/Time   PROT 6.7 05/17/2016 0949   ALBUMIN 3.8 05/17/2016 0949   AST 18 05/17/2016 0949   ALT 16 05/17/2016 0949   ALKPHOS 92 05/17/2016 0949   BILITOT 0.6 05/17/2016 0949   BILIDIR 0.1 11/13/2012 1003      Component Value Date/Time   TSH 1.770 05/17/2016 0949   TSH 1.23 11/13/2012 1003   TSH 2.26 11/30/2011 1210    ASSESSMENT AND PLAN: Other depression - Plan: buPROPion (WELLBUTRIN SR) 200 MG 12 hr tablet  Class 1 obesity without serious comorbidity with body mass index (BMI) of 31.0 to 31.9 in adult, unspecified obesity  type  PLAN:  Hypertension We discussed sodium restriction, working on healthy weight loss, and a regular exercise program as the means to achieve improved blood pressure control. Amelia agreed with this plan and agreed to follow up as directed. We will continue to monitor her blood pressure as well as her progress with the above lifestyle modifications. She will continue her medications as prescribed and will watch for signs of hypotension as she continues her lifestyle modifications.  Depression with Emotional Eating Behaviors We discussed behavior modification techniques today to help Porcia deal with her emotional eating and depression. She has agreed to continue to take Wellbutrin SR 200 mg qd, we will refill for 1 month and she agreed to follow up as directed.  Obesity Treyana is currently in the action stage of change. As such, her goal is to continue with weight loss efforts She has agreed to change to follow the Paleo eating plan and add fruit Amunique has been instructed to work up to a goal of 150 minutes of combined cardio and strengthening exercise per week for weight loss and overall health benefits. We discussed the following Behavioral Modification Strategies today: decrease ETOH and no skipping meals  Krizia has agreed to follow up with our clinic in 2 to 3 weeks. She was informed of the importance of frequent follow up visits to maximize her success with intensive lifestyle modifications for her multiple health conditions.  I, Doreene Nest, am acting as transcriptionist for Dennard Nip, MD  I have reviewed the above documentation for accuracy and completeness, and I agree with the above. -Dennard Nip, MD   OBESITY BEHAVIORAL INTERVENTION VISIT  Today's visit was # 8 out of 22.  Starting weight: 200  lbs Starting date: 05/17/16 Today's weight : 189 lbs  Today's date: 09/12/2016 Total lbs lost to date: 11 (Patients must lose 7 lbs in the first 6 months to  continue with counseling)   ASK: We discussed the diagnosis of obesity with Brenda Russo today and Janani agreed to give [name] permission to discuss obesity behavioral modification therapy today.  ASSESS: Shonika has the diagnosis of obesity and her BMI today is 31.5 Gila is in the action stage of change   ADVISE: Tinea was educated on the multiple health risks of obesity as well as the benefit of weight loss to improve her health. She was advised of the need for long term treatment and the importance of lifestyle modifications.  AGREE: Multiple dietary modification options and treatment options were discussed and  Deryl agreed to change to follow the Paleo eating plan and add fruit. We discussed the following Behavioral Modification Strategies today: decrease ETOH and no skipping meals

## 2016-09-29 MED FILL — BUPROPION HCL SR 200 MG TAB: 200 | 30 days supply | Qty: 60 | Fill #0

## 2016-10-05 ENCOUNTER — Ambulatory Visit (INDEPENDENT_AMBULATORY_CARE_PROVIDER_SITE_OTHER): Payer: 59 | Admitting: Family Medicine

## 2016-10-05 VITALS — BP 137/76 | HR 62 | Temp 98.1°F | Ht 65.0 in | Wt 191.0 lb

## 2016-10-05 DIAGNOSIS — Z9189 Other specified personal risk factors, not elsewhere classified: Secondary | ICD-10-CM

## 2016-10-05 DIAGNOSIS — K5909 Other constipation: Secondary | ICD-10-CM | POA: Diagnosis not present

## 2016-10-05 DIAGNOSIS — E669 Obesity, unspecified: Secondary | ICD-10-CM | POA: Diagnosis not present

## 2016-10-05 DIAGNOSIS — Z6831 Body mass index (BMI) 31.0-31.9, adult: Secondary | ICD-10-CM

## 2016-10-05 MED ORDER — POLYETHYLENE GLYCOL 3350 17 GM/SCOOP PO POWD: 17.0000 g | Freq: Every day | ORAL | 0 refills | Status: DC

## 2016-10-05 MED FILL — POLYETHYLENE GLYCOL 3350 PO: 90 days supply | Qty: 1581 | Fill #0

## 2016-10-05 NOTE — Progress Notes (Signed)
Office: [phone]  /  Fax: [phone]   HPI:   Chief Complaint: OBESITY Brenda Russo is here to discuss her progress with her obesity treatment plan. She is on the Paleo eating plan and is following her eating plan approximately 80 % of the time. She states she is exercising to exercise DVD and is walking for 30 minutes 2 times per week. Brenda Russo is off track the last few days with increased celebration eating and eating out. She is getting ready to go on vacation over labor day and has questions about how to do better when she eats out. Her weight is 191 lb (86.6 kg) today and has had a weight gain of 2 lbs over a period of 3 weeks since her last visit. She has lost 9 lbs since starting treatment with [name].  Constipation Brenda Russo notes constipation for the last few weeks, worse since attempting weight loss. She states BM are less frequent and are harder and she has intermittent LLQ pain without fever and is not improved with decreasing simple carbohydrates. She denies hematochezia or melena.   At risk for diabetes Brenda Russo is at higher than average risk for developing diabetes due to her obesity. She currently denies polyuria or polydipsia.  ALLERGIES: Allergies  Allergen Reactions   Eggs Or Egg-Derived Products     Shortness of breath, wheezing    MEDICATIONS: Current Outpatient Prescriptions on File Prior to Visit  Medication Sig Dispense Refill   albuterol (PROVENTIL) (2.5 MG/3ML) 0.083% nebulizer solution Take 3 mLs (2.5 mg total) by nebulization 3 (three) times daily as needed. 75 mL 6   albuterol (VENTOLIN HFA) 108 (90 Base) MCG/ACT inhaler Inhale 1-2 puffs into the lungs every 4 (four) hours as needed for wheezing or shortness of breath. 54 g 1   amLODipine (NORVASC) 5 MG tablet Take 0.5 tablets (2.5 mg total) by mouth daily. 15 tablet 8   Ascorbic Acid (VITAMIN C) 1000 MG tablet Take 1,000 mg by mouth daily.     B Complex-C-Folic Acid (B-COMPLEX BALANCED PO) Take by  mouth.     buPROPion (WELLBUTRIN SR) 200 MG 12 hr tablet Take 1 tablet (200 mg total) by mouth 2 (two) times daily. 60 tablet 0   Cholecalciferol (VITAMIN D3) 5000 UNITS CAPS Take 1 capsule by mouth daily.     Coenzyme Q10 (COQ10) 100 MG CAPS Take 1 capsule by mouth daily.     fluticasone (FLONASE) 50 MCG/ACT nasal spray Place 2 sprays into both nostrils daily. 16 g 5   fluticasone furoate-vilanterol (BREO ELLIPTA) 200-25 MCG/INH AEPB Inhale 1 puff into the lungs daily. 180 each 1   losartan-hydrochlorothiazide (HYZAAR) 100-12.5 MG tablet Take 0.5 tablets by mouth daily. 15 tablet 8   magnesium oxide (MAG-OX) 400 MG tablet 2 tablets daily. 400-800 mg daily     Multiple Minerals-Vitamins (CALCIUM CITRATE PLUS/MAGNESIUM) TABS Take 1 tablet by mouth.     Multiple Vitamins-Minerals (COMPLETE WOMENS PO) Take 1 capsule by mouth 2 (two) times daily.       Omega-3 Fatty Acids (FISH OIL) 1000 MG CAPS Take 1 capsule by mouth.     pantoprazole (PROTONIX) 40 MG tablet Take 1 tablet (40 mg total) by mouth daily. 30 minutes before the last meal of the day 90 tablet 3   No current facility-administered medications on file prior to visit.     PAST MEDICAL HISTORY: Past Medical History:  Diagnosis Date   Allergic rhinitis    Anemia    Anxiety  Asthma    HTN (hypertension)    Hyperlipidemia    borderline   Obese    OSA (obstructive sleep apnea)    mild ,no CPAP   Overweight(278.02)    Plantar wart    history   Prediabetes    Shortness of breath     PAST SURGICAL HISTORY: Past Surgical History:  Procedure Laterality Date   BREAST BIOPSY Right 09/02/2015   CESAREAN SECTION     TUBAL LIGATION      SOCIAL HISTORY: Social History  Substance Use Topics   Smoking status: Never Smoker   Smokeless tobacco: Never Used   Alcohol use No    FAMILY HISTORY: Family History  Problem Relation Age of Onset   Heart disease Mother        pacemaker    Hypertension  Mother    Hyperlipidemia Mother    Obesity Mother    Asthma Father    Pancreatic cancer Brother    Breast cancer Maternal Aunt    Colon cancer Neg Hx     ROS: Review of Systems  Constitutional: Negative for fever and weight loss.  Gastrointestinal: Positive for abdominal pain (LLQ) and constipation. Negative for blood in stool and melena.  Genitourinary: Negative for frequency.  Endo/Heme/Allergies: Negative for polydipsia.    PHYSICAL EXAM: Blood pressure 137/76, pulse 62, temperature 98.1 F (36.7 C), temperature source Oral, height 5\' 5"  (1.651 m), weight 191 lb (86.6 kg), SpO2 97 %. Body mass index is 31.78 kg/m. Physical Exam  Constitutional: She is oriented to person, place, and time. She appears well-developed and well-nourished.  Cardiovascular: Normal rate.   Pulmonary/Chest: Effort normal.  Musculoskeletal: Normal range of motion.  Neurological: She is oriented to person, place, and time.  Skin: Skin is warm and dry.  Psychiatric: She has a normal mood and affect. Her behavior is normal.  Vitals reviewed.   RECENT LABS AND TESTS: BMET    Component Value Date/Time   NA 143 05/17/2016 0949   K 4.3 05/17/2016 0949   CL 103 05/17/2016 0949   CO2 27 05/17/2016 0949   GLUCOSE 98 05/17/2016 0949   GLUCOSE 100 (H) 06/03/2015 0747   BUN 18 05/17/2016 0949   CREATININE 0.76 05/17/2016 0949   CALCIUM 9.0 05/17/2016 0949   GFRNONAA 86 05/17/2016 0949   GFRAA 99 05/17/2016 0949   Lab Results  Component Value Date   HGBA1C 5.6 05/17/2016   HGBA1C 5.9 09/22/2015   HGBA1C 6.2 06/03/2015   HGBA1C 6.0 05/21/2014   Lab Results  Component Value Date   INSULIN 9.8 05/17/2016   CBC    Component Value Date/Time   WBC 5.8 05/17/2016 0949   WBC 6.5 11/13/2012 1003   RBC 4.83 05/17/2016 0949   RBC 5.03 11/13/2012 1003   HGB 13.7 05/17/2016 0949   HCT 42.4 05/17/2016 0949   PLT 268.0 11/13/2012 1003   MCV 88 05/17/2016 0949   MCH 28.4 05/17/2016 0949   MCH  26.9 (A) 05/20/2012 1404   MCHC 32.3 05/17/2016 0949   MCHC 33.5 11/13/2012 1003   RDW 14.2 05/17/2016 0949   LYMPHSABS 1.7 05/17/2016 0949   MONOABS 0.4 11/13/2012 1003   EOSABS 0.2 05/17/2016 0949   BASOSABS 0.0 05/17/2016 0949   Iron/TIBC/Ferritin/ %Sat    Component Value Date/Time   IRON 88 05/20/2012 1422   TIBC 303 05/20/2012 1422   FERRITIN 80 05/20/2012 1422   IRONPCTSAT 29 05/20/2012 1422   Lipid Panel     Component Value  Date/Time   CHOL 162 05/17/2016 0949   TRIG 50 05/17/2016 0949   HDL 57 05/17/2016 0949   CHOLHDL 3 06/03/2015 0747   VLDL 9.0 06/03/2015 0747   LDLCALC 95 05/17/2016 0949   Hepatic Function Panel     Component Value Date/Time   PROT 6.7 05/17/2016 0949   ALBUMIN 3.8 05/17/2016 0949   AST 18 05/17/2016 0949   ALT 16 05/17/2016 0949   ALKPHOS 92 05/17/2016 0949   BILITOT 0.6 05/17/2016 0949   BILIDIR 0.1 11/13/2012 1003      Component Value Date/Time   TSH 1.770 05/17/2016 0949   TSH 1.23 11/13/2012 1003   TSH 2.26 11/30/2011 1210    ASSESSMENT AND PLAN: Other constipation - Plan: polyethylene glycol powder (GLYCOLAX/MIRALAX) powder  At risk for diabetes mellitus  Class 1 obesity without serious comorbidity with body mass index (BMI) of 31.0 to 31.9 in adult, unspecified obesity type  PLAN:  Constipation Brenda Russo was informed decrease bowel movement frequency is normal while losing weight, but stools should not be hard or painful. She was advised to increase her H20 intake and work on increasing her fiber intake. High fiber foods were discussed today. She agrees to start Miralax 17 grams per day 1 month with no refills and will follow up with our clinic in 2 weeks.  Diabetes risk counselling Brenda Russo was given extended (15 minutes) diabetes prevention counseling today. She is 61 y.o. female and has risk factors for diabetes including obesity. We discussed intensive lifestyle modifications today with an emphasis on weight loss as well  as increasing exercise and decreasing simple carbohydrates in her diet.  Obesity Brenda Russo is currently in the action stage of change. As such, her goal is to continue with weight loss efforts She has agreed to follow the Paleo eating plan Brenda Russo has been instructed to work up to a goal of 150 minutes of combined cardio and strengthening exercise per week for weight loss and overall health benefits. We discussed the following Behavioral Modification Strategies today: increase H2O intake, dealing with family or coworker sabotage and travel eating strategies   Brenda Russo has agreed to follow up with our clinic in 2 weeks. She was informed of the importance of frequent follow up visits to maximize her success with intensive lifestyle modifications for her multiple health conditions.  I, Doreene Nest, am acting as transcriptionist for Dennard Nip, MD  I have reviewed the above documentation for accuracy and completeness, and I agree with the above. -Dennard Nip, MD   OBESITY BEHAVIORAL INTERVENTION VISIT  Today's visit was # 9 out of 22.  Starting weight: 200 lbs Starting date: 05/17/16 Today's weight : 191 lbs Today's date: 10/05/2016 Total lbs lost to date: 9 (Patients must lose 7 lbs in the first 6 months to continue with counseling)   ASK: We discussed the diagnosis of obesity with Brenda Russo today and Brenda Russo agreed to give [name] permission to discuss obesity behavioral modification therapy today.  ASSESS: Brenda Russo has the diagnosis of obesity and her BMI today is 31.78 Brenda Russo is in the action stage of change   ADVISE: Brenda Russo was educated on the multiple health risks of obesity as well as the benefit of weight loss to improve her health. She was advised of the need for long term treatment and the importance of lifestyle modifications.  AGREE: Multiple dietary modification options and treatment options were discussed and  Brenda Russo agreed to follow the Paleo eating  plan We discussed the following Behavioral Modification  Strategies today: increase H2O intake, dealing with family or coworker sabotage and travel eating strategies

## 2016-10-20 ENCOUNTER — Ambulatory Visit (INDEPENDENT_AMBULATORY_CARE_PROVIDER_SITE_OTHER): Payer: 59 | Admitting: Family Medicine

## 2016-10-20 VITALS — BP 133/75 | HR 63 | Temp 98.1°F | Ht 65.0 in | Wt 191.0 lb

## 2016-10-20 DIAGNOSIS — Z6831 Body mass index (BMI) 31.0-31.9, adult: Secondary | ICD-10-CM

## 2016-10-20 DIAGNOSIS — F3289 Other specified depressive episodes: Secondary | ICD-10-CM | POA: Diagnosis not present

## 2016-10-20 DIAGNOSIS — Z9189 Other specified personal risk factors, not elsewhere classified: Secondary | ICD-10-CM | POA: Diagnosis not present

## 2016-10-20 DIAGNOSIS — E669 Obesity, unspecified: Secondary | ICD-10-CM

## 2016-10-20 DIAGNOSIS — E559 Vitamin D deficiency, unspecified: Secondary | ICD-10-CM | POA: Diagnosis not present

## 2016-10-20 MED ORDER — BUPROPION HCL ER (SR) 200 MG PO TB12: 200.0000 mg | ORAL_TABLET | Freq: Two times a day (BID) | ORAL | 0 refills | Status: DC

## 2016-10-20 NOTE — Progress Notes (Signed)
Office: [phone]  /  Fax: [phone]   HPI:   Chief Complaint: OBESITY Brenda Russo is here to discuss her progress with her obesity treatment plan. She is on the Paleo eating plan and is following her eating plan approximately 70 to 75 % of the time. She states she is exercising 0 minutes 0 times per week. Brenda Russo is struggling to follow the Paleo plan and is bored with the category 2 plan. She would like to look at other options. Her weight is 191 lb (86.6 kg) today and has maintained weight over a period of 2 weeks since her last visit. She has lost 9 lbs since starting treatment with [name].  Vitamin D deficiency Brenda Russo has a diagnosis of vitamin D deficiency. She is currently taking OTC vit D @5 ,000 IU daily. Fatigue is improving and she denies nausea, vomiting or muscle weakness.  At risk for osteopenia and osteoporosis Brenda Russo is at higher risk of osteopenia and osteoporosis due to vitamin D deficiency.   Depression with emotional eating behaviors Brenda Russo is struggling with emotional eating and using food for comfort to the extent that it is negatively impacting her health. She often snacks when she is not hungry. Brenda Russo sometimes feels she is out of control and then feels guilty that she made poor food choices. She has been working on behavior modification techniques to help reduce her emotional eating and has been somewhat successful. Her mood is stable and she shows no sign of suicidal or homicidal ideations. She has no insomnia and her blood pressure is stable.  Depression screen Brenda Russo 2/9 05/17/2016 04/12/2016 12/22/2015 11/21/2015 11/18/2015  Decreased Interest 0 0 0 0 0  Down, Depressed, Hopeless 1 0 0 0 0  PHQ - 2 Score 1 0 0 0 0  Altered sleeping 2 - - - -  Tired, decreased energy 1 - - - -  Change in appetite 2 - - - -  Feeling bad or failure about yourself  0 - - - -  Trouble concentrating 2 - - - -  Moving slowly or fidgety/restless 0 - - - -  Suicidal thoughts 0 -  - - -  PHQ-9 Score 8 - - - -     ALLERGIES: Allergies  Allergen Reactions  . Eggs Or Egg-Derived Products     Shortness of breath, wheezing    MEDICATIONS: Current Outpatient Prescriptions on File Prior to Visit  Medication Sig Dispense Refill  . albuterol (PROVENTIL) (2.5 MG/3ML) 0.083% nebulizer solution Take 3 mLs (2.5 mg total) by nebulization 3 (three) times daily as needed. 75 mL 6  . albuterol (VENTOLIN HFA) 108 (90 Base) MCG/ACT inhaler Inhale 1-2 puffs into the lungs every 4 (four) hours as needed for wheezing or shortness of breath. 54 g 1  . amLODipine (NORVASC) 5 MG tablet Take 0.5 tablets (2.5 mg total) by mouth daily. 15 tablet 8  . Ascorbic Acid (VITAMIN C) 1000 MG tablet Take 1,000 mg by mouth daily.    . B Complex-C-Folic Acid (B-COMPLEX BALANCED PO) Take by mouth.    Marland Kitchen buPROPion (WELLBUTRIN SR) 200 MG 12 hr tablet Take 1 tablet (200 mg total) by mouth 2 (two) times daily. 60 tablet 0  . Cholecalciferol (VITAMIN D3) 5000 UNITS CAPS Take 1 capsule by mouth daily.    . Coenzyme Q10 (COQ10) 100 MG CAPS Take 1 capsule by mouth daily.    . fluticasone (FLONASE) 50 MCG/ACT nasal spray Place 2 sprays into both nostrils daily. 16 g 5  .  fluticasone furoate-vilanterol (BREO ELLIPTA) 200-25 MCG/INH AEPB Inhale 1 puff into the lungs daily. 180 each 1  . losartan-hydrochlorothiazide (HYZAAR) 100-12.5 MG tablet Take 0.5 tablets by mouth daily. 15 tablet 8  . magnesium oxide (MAG-OX) 400 MG tablet 2 tablets daily. 400-800 mg daily    . Multiple Minerals-Vitamins (CALCIUM CITRATE PLUS/MAGNESIUM) TABS Take 1 tablet by mouth.    . Multiple Vitamins-Minerals (COMPLETE WOMENS PO) Take 1 capsule by mouth 2 (two) times daily.      . Omega-3 Fatty Acids (FISH OIL) 1000 MG CAPS Take 1 capsule by mouth.    . polyethylene glycol powder (GLYCOLAX/MIRALAX) powder Take 17 g by mouth daily. 3350 g 0   No current facility-administered medications on file prior to visit.     PAST MEDICAL  HISTORY: Past Medical History:  Diagnosis Date  . Allergic rhinitis   . Anemia   . Anxiety   . Asthma   . HTN (hypertension)   . Hyperlipidemia    borderline  . Obese   . OSA (obstructive sleep apnea)    mild ,no CPAP  . Overweight(278.02)   . Plantar wart    history  . Prediabetes   . Shortness of breath     PAST SURGICAL HISTORY: Past Surgical History:  Procedure Laterality Date  . BREAST BIOPSY Right 09/02/2015  . CESAREAN SECTION    . TUBAL LIGATION      SOCIAL HISTORY: Social History  Substance Use Topics  . Smoking status: Never Smoker  . Smokeless tobacco: Never Used  . Alcohol use No    FAMILY HISTORY: Family History  Problem Relation Age of Onset  . Heart disease Mother        pacemaker   . Hypertension Mother   . Hyperlipidemia Mother   . Obesity Mother   . Asthma Father   . Pancreatic cancer Brother   . Breast cancer Maternal Aunt   . Colon cancer Neg Hx     ROS: Review of Systems  Constitutional: Positive for malaise/fatigue. Negative for weight loss.  Gastrointestinal: Negative for nausea and vomiting.  Musculoskeletal:       Negative muscle weakness  Psychiatric/Behavioral: Positive for depression. Negative for suicidal ideas. The patient does not have insomnia.     PHYSICAL EXAM: Blood pressure 133/75, pulse 63, temperature 98.1 F (36.7 C), temperature source Oral, height 5\' 5"  (1.651 m), weight 191 lb (86.6 kg), SpO2 97 %. Body mass index is 31.78 kg/m. Physical Exam  Constitutional: She is oriented to person, place, and time. She appears well-developed and well-nourished.  Cardiovascular: Normal rate.   Pulmonary/Chest: Effort normal.  Musculoskeletal: Normal range of motion.  Neurological: She is oriented to person, place, and time.  Skin: Skin is warm and dry.  Psychiatric: She has a normal mood and affect. Her behavior is normal.  Vitals reviewed.   RECENT LABS AND TESTS: BMET    Component Value Date/Time   NA 143  05/17/2016 0949   K 4.3 05/17/2016 0949   CL 103 05/17/2016 0949   CO2 27 05/17/2016 0949   GLUCOSE 98 05/17/2016 0949   GLUCOSE 100 (H) 06/03/2015 0747   BUN 18 05/17/2016 0949   CREATININE 0.76 05/17/2016 0949   CALCIUM 9.0 05/17/2016 0949   GFRNONAA 86 05/17/2016 0949   GFRAA 99 05/17/2016 0949   Lab Results  Component Value Date   HGBA1C 5.6 05/17/2016   HGBA1C 5.9 09/22/2015   HGBA1C 6.2 06/03/2015   HGBA1C 6.0 05/21/2014   Lab Results  Component Value Date   INSULIN 9.8 05/17/2016   CBC    Component Value Date/Time   WBC 5.8 05/17/2016 0949   WBC 6.5 11/13/2012 1003   RBC 4.83 05/17/2016 0949   RBC 5.03 11/13/2012 1003   HGB 13.7 05/17/2016 0949   HCT 42.4 05/17/2016 0949   PLT 268.0 11/13/2012 1003   MCV 88 05/17/2016 0949   MCH 28.4 05/17/2016 0949   MCH 26.9 (A) 05/20/2012 1404   MCHC 32.3 05/17/2016 0949   MCHC 33.5 11/13/2012 1003   RDW 14.2 05/17/2016 0949   LYMPHSABS 1.7 05/17/2016 0949   MONOABS 0.4 11/13/2012 1003   EOSABS 0.2 05/17/2016 0949   BASOSABS 0.0 05/17/2016 0949   Iron/TIBC/Ferritin/ %Sat    Component Value Date/Time   IRON 88 05/20/2012 1422   TIBC 303 05/20/2012 1422   FERRITIN 80 05/20/2012 1422   IRONPCTSAT 29 05/20/2012 1422   Lipid Panel     Component Value Date/Time   CHOL 162 05/17/2016 0949   TRIG 50 05/17/2016 0949   HDL 57 05/17/2016 0949   CHOLHDL 3 06/03/2015 0747   VLDL 9.0 06/03/2015 0747   LDLCALC 95 05/17/2016 0949   Hepatic Function Panel     Component Value Date/Time   PROT 6.7 05/17/2016 0949   ALBUMIN 3.8 05/17/2016 0949   AST 18 05/17/2016 0949   ALT 16 05/17/2016 0949   ALKPHOS 92 05/17/2016 0949   BILITOT 0.6 05/17/2016 0949   BILIDIR 0.1 11/13/2012 1003      Component Value Date/Time   TSH 1.770 05/17/2016 0949   TSH 1.23 11/13/2012 1003   TSH 2.26 11/30/2011 1210    ASSESSMENT AND PLAN: Other depression - with emotional eating - Plan: buPROPion (WELLBUTRIN SR) 200 MG 12 hr  tablet  Vitamin D deficiency  At risk for osteoporosis  Class 1 obesity with serious comorbidity and body mass index (BMI) of 31.0 to 31.9 in adult, unspecified obesity type  Other depression - Plan: buPROPion (WELLBUTRIN SR) 200 MG 12 hr tablet  PLAN:  Vitamin D Deficiency Brenda Russo was informed that low vitamin D levels contributes to fatigue and are associated with obesity, breast, and colon cancer. She agrees to continue to take OTC Vit D @5 ,000 IU daily and we will re-check labs in 2 weeks and will follow up for routine testing of vitamin D, at least 2-3 times per year. She was informed of the risk of over-replacement of vitamin D and agrees to not increase her dose unless he discusses this with [name] first.  At risk for osteopenia and osteoporosis Brenda Russo is at risk for osteopenia and osteoporosis due to her vitamin D deficiency. She was encouraged to take her vitamin D and follow her higher calcium diet and increase strengthening exercise to help strengthen her bones and decrease her risk of osteopenia and osteoporosis.  Depression with Emotional Eating Behaviors We discussed behavior modification techniques today to help Brenda Russo deal with her emotional eating and depression. She has agreed to continue Wellbutrin SR 200 mg bid we will refill for 1 month and she agreed to follow up as directed.  Obesity Brenda Russo is currently in the action stage of change. As such, her goal is to continue with weight loss efforts She has agreed to change to keep a food journal with 1100 to 1300 calories and 75+ grams of protein daily Brenda Russo has been instructed to work up to a goal of 150 minutes of combined cardio and strengthening exercise per week for weight loss and  overall health benefits. We discussed the following Behavioral Modification Strategies today: no skipping meals, keep a strict food journal, decreasing simple carbohydrates  and work on meal planning and easy cooking plans  Brenda Russo  has agreed to follow up with our clinic in 2 to 3 weeks. She was informed of the importance of frequent follow up visits to maximize her success with intensive lifestyle modifications for her multiple health conditions.  I, Doreene Nest, am acting as transcriptionist for Dennard Nip, MD  I have reviewed the above documentation for accuracy and completeness, and I agree with the above. -Dennard Nip, MD   OBESITY BEHAVIORAL INTERVENTION VISIT  Today's visit was # 10 out of 22.  Starting weight: 200 lbs Starting date: 05/17/16 Today's weight : 191 lbs  Today's date: 10/20/2016 Total lbs lost to date: 9 (Patients must lose 7 lbs in the first 6 months to continue with counseling)   ASK: We discussed the diagnosis of obesity with Brenda Russo today and Brenda Russo agreed to give [name] permission to discuss obesity behavioral modification therapy today.  ASSESS: Brenda Russo has the diagnosis of obesity and her BMI today is 31.78 Brenda Russo is in the action stage of change   ADVISE: Brenda Russo was educated on the multiple health risks of obesity as well as the benefit of weight loss to improve her health. She was advised of the need for long term treatment and the importance of lifestyle modifications.  AGREE: Multiple dietary modification options and treatment options were discussed and  Brenda Russo agreed to change to keep a food journal with 1100 to 1300 calories and 75+ grams of protein daily We discussed the following Behavioral Modification Strategies today: no skipping meals, keep a strict food journal, decreasing simple carbohydrates  and work on meal planning and easy cooking plans

## 2016-10-24 MED FILL — BUPROPION HCL SR 200 MG TAB: 200 | 30 days supply | Qty: 60 | Fill #0

## 2016-11-08 ENCOUNTER — Ambulatory Visit (INDEPENDENT_AMBULATORY_CARE_PROVIDER_SITE_OTHER): Payer: 59 | Admitting: Family Medicine

## 2016-11-08 VITALS — BP 124/76 | HR 67 | Temp 98.4°F | Ht 65.0 in | Wt 189.0 lb

## 2016-11-08 DIAGNOSIS — M25551 Pain in right hip: Secondary | ICD-10-CM | POA: Diagnosis not present

## 2016-11-08 DIAGNOSIS — Z6831 Body mass index (BMI) 31.0-31.9, adult: Secondary | ICD-10-CM

## 2016-11-08 DIAGNOSIS — Z9189 Other specified personal risk factors, not elsewhere classified: Secondary | ICD-10-CM | POA: Diagnosis not present

## 2016-11-08 DIAGNOSIS — E669 Obesity, unspecified: Secondary | ICD-10-CM

## 2016-11-08 NOTE — Progress Notes (Signed)
Office: [phone]  /  Fax: [phone]   HPI:   Chief Complaint: OBESITY Brenda Russo is here to discuss her progress with her obesity treatment plan. She is on the  keep a food journal with 1100 to 1300 calories and 75+ grams of protein daily and is following her eating plan approximately 93 % of the time. She states she is walking for 45 minutes 7 times per week. Brenda Russo continues to do well with weight loss, with journaling. She is doing well with increasing protein intake. Her weight is 189 lb (85.7 kg) today and has had a weight loss of 2 pounds over a period of 3 weeks since her last visit. She has lost 11 lbs since starting treatment with [name].  Right Hip Pain Brenda Russo notes feeling her right hip having a sudden pain when she is active and the pain lingers for a few hours and is not radiating to the groin or down her leg.  At risk for osteopenia and osteoporosis Brenda Russo is at higher risk of osteopenia and osteoporosis due to vitamin D deficiency.    ALLERGIES: Allergies  Allergen Reactions  . Eggs Or Egg-Derived Products     Shortness of breath, wheezing    MEDICATIONS: Current Outpatient Prescriptions on File Prior to Visit  Medication Sig Dispense Refill  . albuterol (PROVENTIL) (2.5 MG/3ML) 0.083% nebulizer solution Take 3 mLs (2.5 mg total) by nebulization 3 (three) times daily as needed. 75 mL 6  . albuterol (VENTOLIN HFA) 108 (90 Base) MCG/ACT inhaler Inhale 1-2 puffs into the lungs every 4 (four) hours as needed for wheezing or shortness of breath. 54 g 1  . amLODipine (NORVASC) 5 MG tablet Take 0.5 tablets (2.5 mg total) by mouth daily. 15 tablet 8  . Ascorbic Acid (VITAMIN C) 1000 MG tablet Take 1,000 mg by mouth daily.    . B Complex-C-Folic Acid (B-COMPLEX BALANCED PO) Take by mouth.    Marland Kitchen buPROPion (WELLBUTRIN SR) 200 MG 12 hr tablet Take 1 tablet (200 mg total) by mouth 2 (two) times daily. 60 tablet 0  . Cholecalciferol (VITAMIN D3) 5000 UNITS CAPS Take 1  capsule by mouth daily.    . Coenzyme Q10 (COQ10) 100 MG CAPS Take 1 capsule by mouth daily.    . fluticasone (FLONASE) 50 MCG/ACT nasal spray Place 2 sprays into both nostrils daily. 16 g 5  . fluticasone furoate-vilanterol (BREO ELLIPTA) 200-25 MCG/INH AEPB Inhale 1 puff into the lungs daily. 180 each 1  . losartan-hydrochlorothiazide (HYZAAR) 100-12.5 MG tablet Take 0.5 tablets by mouth daily. 15 tablet 8  . magnesium oxide (MAG-OX) 400 MG tablet 2 tablets daily. 400-800 mg daily    . Multiple Minerals-Vitamins (CALCIUM CITRATE PLUS/MAGNESIUM) TABS Take 1 tablet by mouth.    . Multiple Vitamins-Minerals (COMPLETE WOMENS PO) Take 1 capsule by mouth 2 (two) times daily.      . Omega-3 Fatty Acids (FISH OIL) 1000 MG CAPS Take 1 capsule by mouth.    . polyethylene glycol powder (GLYCOLAX/MIRALAX) powder Take 17 g by mouth daily. 3350 g 0   No current facility-administered medications on file prior to visit.     PAST MEDICAL HISTORY: Past Medical History:  Diagnosis Date  . Allergic rhinitis   . Anemia   . Anxiety   . Asthma   . HTN (hypertension)   . Hyperlipidemia    borderline  . Obese   . OSA (obstructive sleep apnea)    mild ,no CPAP  . Overweight(278.02)   .  Plantar wart    history  . Prediabetes   . Shortness of breath     PAST SURGICAL HISTORY: Past Surgical History:  Procedure Laterality Date  . BREAST BIOPSY Right 09/02/2015  . CESAREAN SECTION    . TUBAL LIGATION      SOCIAL HISTORY: Social History  Substance Use Topics  . Smoking status: Never Smoker  . Smokeless tobacco: Never Used  . Alcohol use No    FAMILY HISTORY: Family History  Problem Relation Age of Onset  . Heart disease Mother        pacemaker   . Hypertension Mother   . Hyperlipidemia Mother   . Obesity Mother   . Asthma Father   . Pancreatic cancer Brother   . Breast cancer Maternal Aunt   . Colon cancer Neg Hx     ROS: Review of Systems  Constitutional: Positive for weight  loss.  Musculoskeletal:       Hip Pain (Right side)    PHYSICAL EXAM: Blood pressure 124/76, pulse 67, temperature 98.4 F (36.9 C), temperature source Oral, height 5\' 5"  (1.651 m), weight 189 lb (85.7 kg), SpO2 97 %. Body mass index is 31.45 kg/m. Physical Exam  Constitutional: She is oriented to person, place, and time. She appears well-developed and well-nourished.  Cardiovascular: Normal rate.   Pulmonary/Chest: Effort normal.  Musculoskeletal: Normal range of motion.  Neurological: She is oriented to person, place, and time.  Skin: Skin is warm and dry.  Psychiatric: She has a normal mood and affect. Her behavior is normal.  Vitals reviewed.   RECENT LABS AND TESTS: BMET    Component Value Date/Time   NA 143 05/17/2016 0949   K 4.3 05/17/2016 0949   CL 103 05/17/2016 0949   CO2 27 05/17/2016 0949   GLUCOSE 98 05/17/2016 0949   GLUCOSE 100 (H) 06/03/2015 0747   BUN 18 05/17/2016 0949   CREATININE 0.76 05/17/2016 0949   CALCIUM 9.0 05/17/2016 0949   GFRNONAA 86 05/17/2016 0949   GFRAA 99 05/17/2016 0949   Lab Results  Component Value Date   HGBA1C 5.6 05/17/2016   HGBA1C 5.9 09/22/2015   HGBA1C 6.2 06/03/2015   HGBA1C 6.0 05/21/2014   Lab Results  Component Value Date   INSULIN 9.8 05/17/2016   CBC    Component Value Date/Time   WBC 5.8 05/17/2016 0949   WBC 6.5 11/13/2012 1003   RBC 4.83 05/17/2016 0949   RBC 5.03 11/13/2012 1003   HGB 13.7 05/17/2016 0949   HCT 42.4 05/17/2016 0949   PLT 268.0 11/13/2012 1003   MCV 88 05/17/2016 0949   MCH 28.4 05/17/2016 0949   MCH 26.9 (A) 05/20/2012 1404   MCHC 32.3 05/17/2016 0949   MCHC 33.5 11/13/2012 1003   RDW 14.2 05/17/2016 0949   LYMPHSABS 1.7 05/17/2016 0949   MONOABS 0.4 11/13/2012 1003   EOSABS 0.2 05/17/2016 0949   BASOSABS 0.0 05/17/2016 0949   Iron/TIBC/Ferritin/ %Sat    Component Value Date/Time   IRON 88 05/20/2012 1422   TIBC 303 05/20/2012 1422   FERRITIN 80 05/20/2012 1422    IRONPCTSAT 29 05/20/2012 1422   Lipid Panel     Component Value Date/Time   CHOL 162 05/17/2016 0949   TRIG 50 05/17/2016 0949   HDL 57 05/17/2016 0949   CHOLHDL 3 06/03/2015 0747   VLDL 9.0 06/03/2015 0747   LDLCALC 95 05/17/2016 0949   Hepatic Function Panel     Component Value Date/Time   PROT  6.7 05/17/2016 0949   ALBUMIN 3.8 05/17/2016 0949   AST 18 05/17/2016 0949   ALT 16 05/17/2016 0949   ALKPHOS 92 05/17/2016 0949   BILITOT 0.6 05/17/2016 0949   BILIDIR 0.1 11/13/2012 1003      Component Value Date/Time   TSH 1.770 05/17/2016 0949   TSH 1.23 11/13/2012 1003   TSH 2.26 11/30/2011 1210    ASSESSMENT AND PLAN: Right hip pain  At risk for osteoporosis  Class 1 obesity with serious comorbidity and body mass index (BMI) of 31.0 to 31.9 in adult, unspecified obesity type  PLAN:  Right Hip Pain Rossi was advised, exercises to strengthen her joint will be helpful and OTC NSAID's may be used sparingly with a full stomach. Ambera will follow up with our clinic in 2 to 3 weeks.  At risk for osteopenia and osteoporosis Fani is at risk for osteopenia and osteoporosis due to her vitamin D deficiency. She was encouraged to take her vitamin D and follow her higher calcium diet and increase strengthening exercise to help strengthen her bones and decrease her risk of osteopenia and osteoporosis.  Obesity Zuma is currently in the action stage of change. As such, her goal is to continue with weight loss efforts She has agreed to keep a food journal with 1100 to 1300 calories and 75+ grams of protein daily Chimamanda has been instructed to work up to a goal of 150 minutes of combined cardio and strengthening exercise per week for weight loss and overall health benefits. We discussed the following Behavioral Modification Strategies today: no skipping meals, increasing lean protein intake and decreasing simple carbohydrates   Jone has agreed to follow up with our  clinic in 2 to 3 weeks. She was informed of the importance of frequent follow up visits to maximize her success with intensive lifestyle modifications for her multiple health conditions.  I, Doreene Nest, am acting as transcriptionist for Dennard Nip, MD  I have reviewed the above documentation for accuracy and completeness, and I agree with the above. -Dennard Nip, MD   OBESITY BEHAVIORAL INTERVENTION VISIT  Today's visit was # 11 out of 22.  Starting weight: 200 lbs Starting date: 05/17/16 Today's weight : 189 lbs Today's date: 11/08/2016 Total lbs lost to date: 11 (Patients must lose 7 lbs in the first 6 months to continue with counseling)   ASK: We discussed the diagnosis of obesity with [name] today and Jennefer agreed to give [name] permission to discuss obesity behavioral modification therapy today.  ASSESS: Tabbitha has the diagnosis of obesity and her BMI today is 31.45 Faatimah is in the action stage of change   ADVISE: Silveria was educated on the multiple health risks of obesity as well as the benefit of weight loss to improve her health. She was advised of the need for long term treatment and the importance of lifestyle modifications.  AGREE: Multiple dietary modification options and treatment options were discussed and  Sundus agreed to keep a food journal with 1100 to 1300 calories and 75+ grams of protein daily We discussed the following Behavioral Modification Strategies today: no skipping meals, increasing lean protein intake and decreasing simple carbohydrates

## 2016-11-22 ENCOUNTER — Ambulatory Visit (INDEPENDENT_AMBULATORY_CARE_PROVIDER_SITE_OTHER): Payer: 59 | Admitting: Family Medicine

## 2016-11-22 VITALS — BP 121/69 | HR 63 | Temp 98.0°F | Ht 65.0 in | Wt 191.0 lb

## 2016-11-22 DIAGNOSIS — Z9189 Other specified personal risk factors, not elsewhere classified: Secondary | ICD-10-CM

## 2016-11-22 DIAGNOSIS — F3289 Other specified depressive episodes: Secondary | ICD-10-CM

## 2016-11-22 DIAGNOSIS — E559 Vitamin D deficiency, unspecified: Secondary | ICD-10-CM

## 2016-11-22 DIAGNOSIS — Z6831 Body mass index (BMI) 31.0-31.9, adult: Secondary | ICD-10-CM

## 2016-11-22 DIAGNOSIS — E669 Obesity, unspecified: Secondary | ICD-10-CM | POA: Diagnosis not present

## 2016-11-22 MED ORDER — BUPROPION HCL ER (SR) 200 MG PO TB12: 200.0000 mg | ORAL_TABLET | Freq: Two times a day (BID) | ORAL | 0 refills | Status: DC

## 2016-11-22 MED FILL — BUPROPION HCL SR 200 MG TAB: 200 | 30 days supply | Qty: 60 | Fill #0

## 2016-11-22 NOTE — Progress Notes (Signed)
Office: [phone]  /  Fax: [phone]   HPI:   Chief Complaint: OBESITY Brenda Russo is here to discuss her progress with her obesity treatment plan. She is on the keep a food journal with 1100-1300 calories and 75+ grams of protein daily and is following her eating plan approximately 75 % of the time. She states she is exercising 50 minutes 3 times per week. Brenda Russo had increased celebration eating and then got off track with increased temptations, not planning meals as well, and increased snacking.  Her weight is 191 lb (86.6 kg) today and has gained 2 pounds since her last visit. She has lost 9 lbs since starting treatment with [name].  Vitamin D deficiency Brenda Russo has a diagnosis of vitamin D deficiency. She started on OTC Vit D3 5,000 daily and still not yet at goal. She still notes fatigue and denies nausea, vomiting or muscle weakness.  At risk for osteopenia and osteoporosis Brenda Russo is at higher risk of osteopenia and osteoporosis due to vitamin D deficiency.   Depression with emotional eating behaviors Brenda Russo is struggling with emotional eating and using food for comfort to the extent that it is negatively impacting her health. Her mood is stable on Wellbutrin XL but she is still fighting emotional eating. She often snacks when she is not hungry. Brenda Russo sometimes feels she is out of control and then feels guilty that she made poor food choices. She has been working on behavior modification techniques to help reduce her emotional eating and has been somewhat successful. She shows no sign of suicidal or homicidal ideations.  Depression screen [facility] 2/9 05/17/2016 04/12/2016 12/22/2015 11/21/2015 11/18/2015  Decreased Interest 0 0 0 0 0  Down, Depressed, Hopeless 1 0 0 0 0  PHQ - 2 Score 1 0 0 0 0  Altered sleeping 2 - - - -  Tired, decreased energy 1 - - - -  Change in appetite 2 - - - -  Feeling bad or failure about yourself  0 - - - -  Trouble concentrating 2 - - - -  Moving  slowly or fidgety/restless 0 - - - -  Suicidal thoughts 0 - - - -  PHQ-9 Score 8 - - - -    ALLERGIES: Allergies  Allergen Reactions  . Eggs Or Egg-Derived Products     Shortness of breath, wheezing    MEDICATIONS: Current Outpatient Prescriptions on File Prior to Visit  Medication Sig Dispense Refill  . albuterol (PROVENTIL) (2.5 MG/3ML) 0.083% nebulizer solution Take 3 mLs (2.5 mg total) by nebulization 3 (three) times daily as needed. 75 mL 6  . albuterol (VENTOLIN HFA) 108 (90 Base) MCG/ACT inhaler Inhale 1-2 puffs into the lungs every 4 (four) hours as needed for wheezing or shortness of breath. 54 g 1  . amLODipine (NORVASC) 5 MG tablet Take 0.5 tablets (2.5 mg total) by mouth daily. 15 tablet 8  . Ascorbic Acid (VITAMIN C) 1000 MG tablet Take 1,000 mg by mouth daily.    . B Complex-C-Folic Acid (B-COMPLEX BALANCED PO) Take by mouth.    . Cholecalciferol (VITAMIN D3) 5000 UNITS CAPS Take 1 capsule by mouth daily.    . Coenzyme Q10 (COQ10) 100 MG CAPS Take 1 capsule by mouth daily.    . fluticasone (FLONASE) 50 MCG/ACT nasal spray Place 2 sprays into both nostrils daily. 16 g 5  . fluticasone furoate-vilanterol (BREO ELLIPTA) 200-25 MCG/INH AEPB Inhale 1 puff into the lungs daily. 180 each 1  . losartan-hydrochlorothiazide (HYZAAR)  100-12.5 MG tablet Take 0.5 tablets by mouth daily. 15 tablet 8  . magnesium oxide (MAG-OX) 400 MG tablet 2 tablets daily. 400-800 mg daily    . Multiple Minerals-Vitamins (CALCIUM CITRATE PLUS/MAGNESIUM) TABS Take 1 tablet by mouth.    . Multiple Vitamins-Minerals (COMPLETE WOMENS PO) Take 1 capsule by mouth 2 (two) times daily.      . Omega-3 Fatty Acids (FISH OIL) 1000 MG CAPS Take 1 capsule by mouth.    . polyethylene glycol powder (GLYCOLAX/MIRALAX) powder Take 17 g by mouth daily. 3350 g 0   No current facility-administered medications on file prior to visit.     PAST MEDICAL HISTORY: Past Medical History:  Diagnosis Date  . Allergic  rhinitis   . Anemia   . Anxiety   . Asthma   . HTN (hypertension)   . Hyperlipidemia    borderline  . Obese   . OSA (obstructive sleep apnea)    mild ,no CPAP  . Overweight(278.02)   . Plantar wart    history  . Prediabetes   . Shortness of breath     PAST SURGICAL HISTORY: Past Surgical History:  Procedure Laterality Date  . BREAST BIOPSY Right 09/02/2015  . CESAREAN SECTION    . TUBAL LIGATION      SOCIAL HISTORY: Social History  Substance Use Topics  . Smoking status: Never Smoker  . Smokeless tobacco: Never Used  . Alcohol use No    FAMILY HISTORY: Family History  Problem Relation Age of Onset  . Heart disease Mother        pacemaker   . Hypertension Mother   . Hyperlipidemia Mother   . Obesity Mother   . Asthma Father   . Pancreatic cancer Brother   . Breast cancer Maternal Aunt   . Colon cancer Neg Hx     ROS: Review of Systems  Constitutional: Positive for malaise/fatigue. Negative for weight loss.  Gastrointestinal: Negative for nausea and vomiting.  Musculoskeletal:       Negative muscle weakness  Psychiatric/Behavioral: Positive for depression. Negative for suicidal ideas.    PHYSICAL EXAM: Blood pressure 121/69, pulse 63, temperature 98 F (36.7 C), temperature source Oral, height 5\' 5"  (1.651 m), weight 191 lb (86.6 kg), SpO2 98 %. Body mass index is 31.78 kg/m. Physical Exam  Constitutional: She is oriented to person, place, and time. She appears well-developed and well-nourished.  Cardiovascular: Normal rate.   Pulmonary/Chest: Effort normal.  Musculoskeletal: Normal range of motion.  Neurological: She is oriented to person, place, and time.  Skin: Skin is warm and dry.  Psychiatric: She has a normal mood and affect. Her behavior is normal.  Vitals reviewed.   RECENT LABS AND TESTS: BMET    Component Value Date/Time   NA 143 05/17/2016 0949   K 4.3 05/17/2016 0949   CL 103 05/17/2016 0949   CO2 27 05/17/2016 0949   GLUCOSE  98 05/17/2016 0949   GLUCOSE 100 (H) 06/03/2015 0747   BUN 18 05/17/2016 0949   CREATININE 0.76 05/17/2016 0949   CALCIUM 9.0 05/17/2016 0949   GFRNONAA 86 05/17/2016 0949   GFRAA 99 05/17/2016 0949   Lab Results  Component Value Date   HGBA1C 5.6 05/17/2016   HGBA1C 5.9 09/22/2015   HGBA1C 6.2 06/03/2015   HGBA1C 6.0 05/21/2014   Lab Results  Component Value Date   INSULIN 9.8 05/17/2016   CBC    Component Value Date/Time   WBC 5.8 05/17/2016 0949   WBC 6.5  11/13/2012 1003   RBC 4.83 05/17/2016 0949   RBC 5.03 11/13/2012 1003   HGB 13.7 05/17/2016 0949   HCT 42.4 05/17/2016 0949   PLT 268.0 11/13/2012 1003   MCV 88 05/17/2016 0949   MCH 28.4 05/17/2016 0949   MCH 26.9 (A) 05/20/2012 1404   MCHC 32.3 05/17/2016 0949   MCHC 33.5 11/13/2012 1003   RDW 14.2 05/17/2016 0949   LYMPHSABS 1.7 05/17/2016 0949   MONOABS 0.4 11/13/2012 1003   EOSABS 0.2 05/17/2016 0949   BASOSABS 0.0 05/17/2016 0949   Iron/TIBC/Ferritin/ %Sat    Component Value Date/Time   IRON 88 05/20/2012 1422   TIBC 303 05/20/2012 1422   FERRITIN 80 05/20/2012 1422   IRONPCTSAT 29 05/20/2012 1422   Lipid Panel     Component Value Date/Time   CHOL 162 05/17/2016 0949   TRIG 50 05/17/2016 0949   HDL 57 05/17/2016 0949   CHOLHDL 3 06/03/2015 0747   VLDL 9.0 06/03/2015 0747   LDLCALC 95 05/17/2016 0949   Hepatic Function Panel     Component Value Date/Time   PROT 6.7 05/17/2016 0949   ALBUMIN 3.8 05/17/2016 0949   AST 18 05/17/2016 0949   ALT 16 05/17/2016 0949   ALKPHOS 92 05/17/2016 0949   BILITOT 0.6 05/17/2016 0949   BILIDIR 0.1 11/13/2012 1003      Component Value Date/Time   TSH 1.770 05/17/2016 0949   TSH 1.23 11/13/2012 1003   TSH 2.26 11/30/2011 1210    ASSESSMENT AND PLAN: Vitamin D deficiency  Other depression - with emotional eating - Plan: buPROPion (WELLBUTRIN SR) 200 MG 12 hr tablet  At risk for osteoporosis  Class 1 obesity with serious comorbidity and body mass  index (BMI) of 31.0 to 31.9 in adult, unspecified obesity type  PLAN:  Vitamin D Deficiency Brenda Russo was informed that low vitamin D levels contributes to fatigue and are associated with obesity, breast, and colon cancer. She agrees to continue taking OTC Vit D3 5,000 IU daily and will follow up for routine testing of vitamin D, at least 2-3 times per year. She was informed of the risk of over-replacement of vitamin D and agrees to not increase her dose unless he discusses this with [name] first. Brenda Russo agrees to follow up with our clinic in 2 weeks and we will recheck labs at that time.  At risk for osteopenia and osteoporosis Brenda Russo is at risk for osteopenia and osteoporsis due to her vitamin D deficiency. She was encouraged to take her vitamin D and follow her higher calcium diet and increase strengthening exercise to help strengthen her bones and decrease her risk of osteopenia and osteoporosis.  Depression with Emotional Eating Behaviors We discussed behavior modification techniques today to help Brenda Russo deal with her emotional eating and depression. She agrees to continue taking Wellbutrin SR 200 mg BID and we will refill for 1 month. We have discussed Cognitive Behavorial Therapy to decrease emotional eating. She agrees to follow up with our clinic in 2 weeks.  Obesity Brenda Russo is currently in the action stage of change. As such, her goal is to continue with weight loss efforts She has agreed to change to follow the Category 2 plan Brenda Russo has been instructed to work up to a goal of 150 minutes of combined cardio and strengthening exercise per week for weight loss and overall health benefits. We discussed the following Behavioral Modification Strategies today: increasing lean protein intake, decreasing simple carbohydrates, work on meal planning and easy cooking plans,  and holiday eating strategies    Brenda Russo has agreed to follow up with our clinic in 2 weeks. She was informed of the  importance of frequent follow up visits to maximize her success with intensive lifestyle modifications for her multiple health conditions.  I, Brenda Russo, am acting as transcriptionist for Brenda Nip, MD  I have reviewed the above documentation for accuracy and completeness, and I agree with the above. -Brenda Nip, MD     Today's visit was # 12 out of 22.  Starting weight: 200 lbs Starting date: 05/17/16 Today's weight : 191 lbs Today's date: 11/22/2016 Total lbs lost to date: 9 (Patients must lose 7 lbs in the first 6 months to continue with counseling)   ASK: We discussed the diagnosis of obesity with Brenda Russo today and Brenda Russo agreed to give [name] permission to discuss obesity behavioral modification therapy today.  ASSESS: Brenda Russo has the diagnosis of obesity and her BMI today is 47 Brenda Russo is in the action stage of change   ADVISE: Brenda Russo was educated on the multiple health risks of obesity as well as the benefit of weight loss to improve her health. She was advised of the need for long term treatment and the importance of lifestyle modifications.  AGREE: Multiple dietary modification options and treatment options were discussed and  Brenda Russo agreed to follow the Category 2 plan We discussed the following Behavioral Modification Strategies today: increasing lean protein intake, decreasing simple carbohydrates, work on meal planning and easy cooking plans, and holiday eating strategies

## 2016-12-06 ENCOUNTER — Ambulatory Visit (INDEPENDENT_AMBULATORY_CARE_PROVIDER_SITE_OTHER): Payer: 59 | Admitting: Family Medicine

## 2016-12-06 VITALS — BP 129/80 | HR 56 | Temp 98.0°F | Ht 65.0 in | Wt 189.0 lb

## 2016-12-06 DIAGNOSIS — Z9189 Other specified personal risk factors, not elsewhere classified: Secondary | ICD-10-CM | POA: Diagnosis not present

## 2016-12-06 DIAGNOSIS — E559 Vitamin D deficiency, unspecified: Secondary | ICD-10-CM | POA: Diagnosis not present

## 2016-12-06 DIAGNOSIS — Z6836 Body mass index (BMI) 36.0-36.9, adult: Secondary | ICD-10-CM

## 2016-12-06 DIAGNOSIS — E8881 Metabolic syndrome: Secondary | ICD-10-CM | POA: Diagnosis not present

## 2016-12-06 DIAGNOSIS — R7989 Other specified abnormal findings of blood chemistry: Secondary | ICD-10-CM | POA: Insufficient documentation

## 2016-12-06 NOTE — Progress Notes (Signed)
Office: [phone]  /  Fax: [phone]   HPI:   Chief Complaint: OBESITY Nely is here to discuss her progress with her obesity treatment plan. She is on the Category 2 plan and is following her eating plan approximately 50 % of the time. She states she is exercising 0 minutes 0 times per week. Aleisha has been off track with increased celebration eating. She did well with weight loss anyway. Her weight is 189 lb (85.7 kg) today and has had a weight loss of 2 pounds over a period of 2 weeks since her last visit. She has lost 11 lbs since starting treatment with [name].  Vitamin D deficiency Truc has a diagnosis of vitamin D deficiency. She is currently taking OTC vit D 5,000 IU daily and is not yet at goal. She denies nausea, vomiting or muscle weakness.  Insulin Resistance Deniqua has a diagnosis of insulin resistance based on her elevated fasting insulin level >5. Although Kailana's blood glucose readings are still under good control, insulin resistance puts her at greater risk of metabolic syndrome and diabetes. Jackilyn denies nausea, vomiting or hypoglycemia. She is not taking metformin currently and continues to work on diet and exercise to decrease risk of diabetes. Chauntelle is due to have labs rechecked.  At risk for diabetes Laylah is at higher than average risk for developing diabetes due to her obesity and insulin resistance. She currently denies polyuria or polydipsia.  Elevated Vitamin B12 Annell was on OTC vitamin B12 previously and was over-replaced. She denies symptoms. Keisa is worried she may have decreased too low off all vitamin B12.   ALLERGIES: Allergies  Allergen Reactions  . Eggs Or Egg-Derived Products     Shortness of breath, wheezing    MEDICATIONS: Current Outpatient Prescriptions on File Prior to Visit  Medication Sig Dispense Refill  . albuterol (PROVENTIL) (2.5 MG/3ML) 0.083% nebulizer solution Take 3 mLs (2.5 mg total) by  nebulization 3 (three) times daily as needed. 75 mL 6  . albuterol (VENTOLIN HFA) 108 (90 Base) MCG/ACT inhaler Inhale 1-2 puffs into the lungs every 4 (four) hours as needed for wheezing or shortness of breath. 54 g 1  . amLODipine (NORVASC) 5 MG tablet Take 0.5 tablets (2.5 mg total) by mouth daily. 15 tablet 8  . Ascorbic Acid (VITAMIN C) 1000 MG tablet Take 1,000 mg by mouth daily.    . B Complex-C-Folic Acid (B-COMPLEX BALANCED PO) Take by mouth.    Marland Kitchen buPROPion (WELLBUTRIN SR) 200 MG 12 hr tablet Take 1 tablet (200 mg total) by mouth 2 (two) times daily. 60 tablet 0  . Cholecalciferol (VITAMIN D3) 5000 UNITS CAPS Take 1 capsule by mouth daily.    . Coenzyme Q10 (COQ10) 100 MG CAPS Take 1 capsule by mouth daily.    . fluticasone (FLONASE) 50 MCG/ACT nasal spray Place 2 sprays into both nostrils daily. 16 g 5  . fluticasone furoate-vilanterol (BREO ELLIPTA) 200-25 MCG/INH AEPB Inhale 1 puff into the lungs daily. 180 each 1  . losartan-hydrochlorothiazide (HYZAAR) 100-12.5 MG tablet Take 0.5 tablets by mouth daily. 15 tablet 8  . magnesium oxide (MAG-OX) 400 MG tablet 2 tablets daily. 400-800 mg daily    . Multiple Minerals-Vitamins (CALCIUM CITRATE PLUS/MAGNESIUM) TABS Take 1 tablet by mouth.    . Multiple Vitamins-Minerals (COMPLETE WOMENS PO) Take 1 capsule by mouth 2 (two) times daily.      . Omega-3 Fatty Acids (FISH OIL) 1000 MG CAPS Take 1 capsule by mouth.    Marland Kitchen  polyethylene glycol powder (GLYCOLAX/MIRALAX) powder Take 17 g by mouth daily. 3350 g 0   No current facility-administered medications on file prior to visit.     PAST MEDICAL HISTORY: Past Medical History:  Diagnosis Date  . Allergic rhinitis   . Anemia   . Anxiety   . Asthma   . HTN (hypertension)   . Hyperlipidemia    borderline  . Obese   . OSA (obstructive sleep apnea)    mild ,no CPAP  . Overweight(278.02)   . Plantar wart    history  . Prediabetes   . Shortness of breath     PAST SURGICAL HISTORY: Past  Surgical History:  Procedure Laterality Date  . BREAST BIOPSY Right 09/02/2015  . CESAREAN SECTION    . TUBAL LIGATION      SOCIAL HISTORY: Social History  Substance Use Topics  . Smoking status: Never Smoker  . Smokeless tobacco: Never Used  . Alcohol use No    FAMILY HISTORY: Family History  Problem Relation Age of Onset  . Heart disease Mother        pacemaker   . Hypertension Mother   . Hyperlipidemia Mother   . Obesity Mother   . Asthma Father   . Pancreatic cancer Brother   . Breast cancer Maternal Aunt   . Colon cancer Neg Hx     ROS: Review of Systems  Constitutional: Positive for weight loss.  Gastrointestinal: Negative for nausea and vomiting.  Genitourinary: Negative for frequency.  Musculoskeletal:       Negative muscle weakness  Endo/Heme/Allergies: Negative for polydipsia.       Negative hypoglycemia    PHYSICAL EXAM: Blood pressure 129/80, pulse (!) 56, temperature 98 F (36.7 C), temperature source Oral, height 5\' 5"  (1.651 m), weight 189 lb (85.7 kg), SpO2 99 %. Body mass index is 31.45 kg/m. Physical Exam  Constitutional: She is oriented to person, place, and time. She appears well-developed and well-nourished.  Cardiovascular: Normal rate.   Pulmonary/Chest: Effort normal.  Musculoskeletal: Normal range of motion.  Neurological: She is oriented to person, place, and time.  Skin: Skin is warm and dry.  Psychiatric: She has a normal mood and affect. Her behavior is normal.  Vitals reviewed.   RECENT LABS AND TESTS: BMET    Component Value Date/Time   NA 143 05/17/2016 0949   K 4.3 05/17/2016 0949   CL 103 05/17/2016 0949   CO2 27 05/17/2016 0949   GLUCOSE 98 05/17/2016 0949   GLUCOSE 100 (H) 06/03/2015 0747   BUN 18 05/17/2016 0949   CREATININE 0.76 05/17/2016 0949   CALCIUM 9.0 05/17/2016 0949   GFRNONAA 86 05/17/2016 0949   GFRAA 99 05/17/2016 0949   Lab Results  Component Value Date   HGBA1C 5.6 05/17/2016   HGBA1C 5.9  09/22/2015   HGBA1C 6.2 06/03/2015   HGBA1C 6.0 05/21/2014   Lab Results  Component Value Date   INSULIN 9.8 05/17/2016   CBC    Component Value Date/Time   WBC 5.8 05/17/2016 0949   WBC 6.5 11/13/2012 1003   RBC 4.83 05/17/2016 0949   RBC 5.03 11/13/2012 1003   HGB 13.7 05/17/2016 0949   HCT 42.4 05/17/2016 0949   PLT 268.0 11/13/2012 1003   MCV 88 05/17/2016 0949   MCH 28.4 05/17/2016 0949   MCH 26.9 (A) 05/20/2012 1404   MCHC 32.3 05/17/2016 0949   MCHC 33.5 11/13/2012 1003   RDW 14.2 05/17/2016 0949   LYMPHSABS 1.7 05/17/2016 0949  MONOABS 0.4 11/13/2012 1003   EOSABS 0.2 05/17/2016 0949   BASOSABS 0.0 05/17/2016 0949   Iron/TIBC/Ferritin/ %Sat    Component Value Date/Time   IRON 88 05/20/2012 1422   TIBC 303 05/20/2012 1422   FERRITIN 80 05/20/2012 1422   IRONPCTSAT 29 05/20/2012 1422   Lipid Panel     Component Value Date/Time   CHOL 162 05/17/2016 0949   TRIG 50 05/17/2016 0949   HDL 57 05/17/2016 0949   CHOLHDL 3 06/03/2015 0747   VLDL 9.0 06/03/2015 0747   LDLCALC 95 05/17/2016 0949   Hepatic Function Panel     Component Value Date/Time   PROT 6.7 05/17/2016 0949   ALBUMIN 3.8 05/17/2016 0949   AST 18 05/17/2016 0949   ALT 16 05/17/2016 0949   ALKPHOS 92 05/17/2016 0949   BILITOT 0.6 05/17/2016 0949   BILIDIR 0.1 11/13/2012 1003      Component Value Date/Time   TSH 1.770 05/17/2016 0949   TSH 1.23 11/13/2012 1003   TSH 2.26 11/30/2011 1210    ASSESSMENT AND PLAN: Insulin resistance - Plan: Comprehensive metabolic panel, Hemoglobin A1c, Insulin, random  Vitamin D deficiency - Plan: VITAMIN D 25 Hydroxy (Vit-D Deficiency, Fractures)  High serum vitamin B12 - Plan: Vitamin B12  At risk for diabetes mellitus  Class 2 severe obesity with serious comorbidity and body mass index (BMI) of 36.0 to 36.9 in adult, unspecified obesity type ([place])  PLAN:  Vitamin D Deficiency Patria was informed that low vitamin D levels contributes to  fatigue and are associated with obesity, breast, and colon cancer. She agrees to continue to take OTC Vit D @5 ,000 IU daily. We will check labs and will follow up for routine testing of vitamin D, at least 2-3 times per year. She was informed of the risk of over-replacement of vitamin D and agrees to not increase her dose unless he discusses this with [name] first.  Insulin Resistance Karalina will continue to work on weight loss, exercise, and decreasing simple carbohydrates in her diet to help decrease the risk of diabetes. She was informed that eating too many simple carbohydrates or too many calories at one sitting increases the likelihood of GI side effects. We will check labs and Rebeckah agreed to follow up with [name] as directed to monitor her progress.  Diabetes risk counseling Luana was given extended (15 minutes) diabetes prevention counseling today. She is 61 y.o. female and has risk factors for diabetes including obesity and insulin resistance. We discussed intensive lifestyle modifications today with an emphasis on weight loss as well as increasing exercise and decreasing simple carbohydrates in her diet.  Elevated Vitamin B12 Aadhira was reminded that she is on a vitamin B12 rich diet. We will check labs and she will follow up with our clinic in 3 weeks.  Obesity Ramon is currently in the action stage of change. As such, her goal is to continue with weight loss efforts She has agreed to keep a food journal with 1100 to 1300 calories and 70+ grams of protein daily Kymora has been instructed to work up to a goal of 150 minutes of combined cardio and strengthening exercise per week for weight loss and overall health benefits. We discussed the following Behavioral Modification Strategies today: decreasing simple carbohydrates, increasing vegetables and halloween holiday eating strategies   Dianna has agreed to follow up with our clinic in 3 weeks. She was informed of the importance  of frequent follow up visits to maximize her success with intensive  lifestyle modifications for her multiple health conditions.  I, Doreene Nest, am acting as transcriptionist for Dennard Nip, MD  I have reviewed the above documentation for accuracy and completeness, and I agree with the above. -Dennard Nip, MD    OBESITY BEHAVIORAL INTERVENTION VISIT  Today's visit was # 13 out of 22.  Starting weight: 200 lbs Starting date: 05/17/16 Today's weight : 189 lbs Today's date: 12/06/2016 Total lbs lost to date: 11 (Patients must lose 7 lbs in the first 6 months to continue with counseling)   ASK: We discussed the diagnosis of obesity with [name] today and Jaliana agreed to give [name] permission to discuss obesity behavioral modification therapy today.  ASSESS: Abrielle has the diagnosis of obesity and her BMI today is 31.45 Ambika is in the action stage of change   ADVISE: Lucine was educated on the multiple health risks of obesity as well as the benefit of weight loss to improve her health. She was advised of the need for long term treatment and the importance of lifestyle modifications.  AGREE: Multiple dietary modification options and treatment options were discussed and  Corinda agreed to keep a food journal with 1100 to 1300 calories and 70+ grams of protein daily We discussed the following Behavioral Modification Strategies today: decreasing simple carbohydrates, increasing vegetables and halloween holiday eating strategies

## 2016-12-07 LAB — COMPREHENSIVE METABOLIC PANEL
ALBUMIN: 4 g/dL (ref 3.6–4.8)
ALK PHOS: 97 IU/L (ref 39–117)
ALT: 22 IU/L (ref 0–32)
AST: 21 IU/L (ref 0–40)
Albumin/Globulin Ratio: 1.3 (ref 1.2–2.2)
BILIRUBIN TOTAL: 0.4 mg/dL (ref 0.0–1.2)
BUN / CREAT RATIO: 27 (ref 12–28)
BUN: 23 mg/dL (ref 8–27)
CHLORIDE: 104 mmol/L (ref 96–106)
CO2: 26 mmol/L (ref 20–29)
CREATININE: 0.85 mg/dL (ref 0.57–1.00)
Calcium: 9.3 mg/dL (ref 8.7–10.3)
GFR calc Af Amer: 86 mL/min/{1.73_m2} (ref >59)
GFR calc non Af Amer: 75 mL/min/{1.73_m2} (ref >59)
GLOBULIN, TOTAL: 3.1 g/dL (ref 1.5–4.5)
GLUCOSE: 103 mg/dL — AB (ref 65–99)
POTASSIUM: 4.3 mmol/L (ref 3.5–5.2)
SODIUM: 143 mmol/L (ref 134–144)
Total Protein: 7.1 g/dL (ref 6.0–8.5)

## 2016-12-07 LAB — INSULIN, RANDOM: INSULIN: 11.2 u[IU]/mL (ref 2.6–24.9)

## 2016-12-07 LAB — VITAMIN B12: VITAMIN B 12: 1955 pg/mL — AB (ref 232–1245)

## 2016-12-07 LAB — HEMOGLOBIN A1C
Est. average glucose Bld gHb Est-mCnc: 114 mg/dL
Hgb A1c MFr Bld: 5.6 % (ref 4.8–5.6)

## 2016-12-07 LAB — VITAMIN D 25 HYDROXY (VIT D DEFICIENCY, FRACTURES): Vit D, 25-Hydroxy: 42.9 ng/mL (ref 30.0–100.0)

## 2016-12-21 ENCOUNTER — Other Ambulatory Visit (INDEPENDENT_AMBULATORY_CARE_PROVIDER_SITE_OTHER): Payer: Self-pay | Admitting: Family Medicine

## 2016-12-21 DIAGNOSIS — F3289 Other specified depressive episodes: Secondary | ICD-10-CM

## 2016-12-22 ENCOUNTER — Other Ambulatory Visit (INDEPENDENT_AMBULATORY_CARE_PROVIDER_SITE_OTHER): Payer: Self-pay | Admitting: Family Medicine

## 2016-12-22 DIAGNOSIS — F3289 Other specified depressive episodes: Secondary | ICD-10-CM

## 2016-12-22 MED FILL — FLUTICASONE PROP 50 MCG SPR: 50 | 90 days supply | Qty: 48 | Fill #2

## 2016-12-22 MED FILL — BREO ELLIPTA 200-25 MCG INH: 200-25 | 90 days supply | Qty: 180 | Fill #1

## 2016-12-22 MED FILL — BUPROPION HCL SR 200 MG TAB: 200 | 30 days supply | Qty: 60 | Fill #0

## 2016-12-22 MED FILL — VENTOLIN HFA 90 MCG INHALER: 108 (90 BAS | 90 days supply | Qty: 54 | Fill #1

## 2016-12-22 MED FILL — LOSARTAN-HCTZ 100-12.5 MG T: 100-12.5 | 90 days supply | Qty: 45 | Fill #1

## 2016-12-22 MED FILL — AMLODIPINE BESYLATE 5 MG TA: 5 | 90 days supply | Qty: 45 | Fill #1

## 2016-12-28 ENCOUNTER — Ambulatory Visit (INDEPENDENT_AMBULATORY_CARE_PROVIDER_SITE_OTHER): Payer: 59 | Admitting: Physician Assistant

## 2016-12-28 VITALS — BP 142/77 | HR 56 | Temp 97.7°F | Ht 65.0 in | Wt 189.0 lb

## 2016-12-28 DIAGNOSIS — E669 Obesity, unspecified: Secondary | ICD-10-CM | POA: Diagnosis not present

## 2016-12-28 DIAGNOSIS — I10 Essential (primary) hypertension: Secondary | ICD-10-CM | POA: Diagnosis not present

## 2016-12-28 DIAGNOSIS — Z6831 Body mass index (BMI) 31.0-31.9, adult: Secondary | ICD-10-CM | POA: Diagnosis not present

## 2016-12-28 NOTE — Progress Notes (Signed)
Office: [phone]  /  Fax: [phone]   HPI:   Chief Complaint: OBESITY Brenda Russo is here to discuss her progress with her obesity treatment plan. She is on the keep a food journal with 1100-1300 calories and 70+ grams of protein daily and is following her eating plan approximately 80 % of the time. She states she is walking 30 minutes 2 times per week. Yazlin maintained her weight. She would like more variety with her meals. Also, she would like holiday eating strategies.  Her weight is 189 lb (85.7 kg) today and has not lost weight since her last visit. She has lost 11 lbs since starting treatment with [name].  Hypertension Brenda Russo is a 61 y.o. female with hypertension. Brenda Russo's blood pressure is elevated. She states her blood pressure is elevated on some occasions at home. Based upon review, blood pressure has been stable on previous visits. She is on Wellbutrin, which could increase her blood pressure. Brenda Russo denies chest pain or shortness of breath. She is working weight loss to help control her blood pressure with the goal of decreasing her risk of heart attack and stroke. Brenda Russo's blood pressure is not currently controlled.  ALLERGIES: Allergies  Allergen Reactions  . Eggs Or Egg-Derived Products     Shortness of breath, wheezing    MEDICATIONS: Current Outpatient Medications on File Prior to Visit  Medication Sig Dispense Refill  . albuterol (PROVENTIL) (2.5 MG/3ML) 0.083% nebulizer solution Take 3 mLs (2.5 mg total) by nebulization 3 (three) times daily as needed. 75 mL 6  . albuterol (VENTOLIN HFA) 108 (90 Base) MCG/ACT inhaler Inhale 1-2 puffs into the lungs every 4 (four) hours as needed for wheezing or shortness of breath. 54 g 1  . amLODipine (NORVASC) 5 MG tablet Take 0.5 tablets (2.5 mg total) by mouth daily. 15 tablet 8  . Ascorbic Acid (VITAMIN C) 1000 MG tablet Take 1,000 mg by mouth daily.    . B Complex-C-Folic Acid (B-COMPLEX BALANCED PO) Take  by mouth.    Brenda Russo buPROPion (WELLBUTRIN SR) 150 MG 12 hr tablet Take 150 mg by mouth daily.    . Cholecalciferol (VITAMIN D3) 5000 UNITS CAPS Take 1 capsule by mouth daily.    . Coenzyme Q10 (COQ10) 100 MG CAPS Take 1 capsule by mouth daily.    . fluticasone (FLONASE) 50 MCG/ACT nasal spray Place 2 sprays into both nostrils daily. 16 g 5  . fluticasone furoate-vilanterol (BREO ELLIPTA) 200-25 MCG/INH AEPB Inhale 1 puff into the lungs daily. 180 each 1  . losartan-hydrochlorothiazide (HYZAAR) 100-12.5 MG tablet Take 0.5 tablets by mouth daily. 15 tablet 8  . magnesium oxide (MAG-OX) 400 MG tablet 2 tablets daily. 400-800 mg daily    . Multiple Minerals-Vitamins (CALCIUM CITRATE PLUS/MAGNESIUM) TABS Take 1 tablet by mouth.    . Multiple Vitamins-Minerals (COMPLETE WOMENS PO) Take 1 capsule by mouth 2 (two) times daily.      . Omega-3 Fatty Acids (FISH OIL) 1000 MG CAPS Take 1 capsule by mouth.    . polyethylene glycol powder (GLYCOLAX/MIRALAX) powder Take 17 g by mouth daily. 3350 g 0   No current facility-administered medications on file prior to visit.     PAST MEDICAL HISTORY: Past Medical History:  Diagnosis Date  . Allergic rhinitis   . Anemia   . Anxiety   . Asthma   . HTN (hypertension)   . Hyperlipidemia    borderline  . Obese   . OSA (obstructive sleep apnea)  mild ,no CPAP  . Overweight(278.02)   . Plantar wart    history  . Prediabetes   . Shortness of breath     PAST SURGICAL HISTORY: Past Surgical History:  Procedure Laterality Date  . BREAST BIOPSY Right 09/02/2015  . CESAREAN SECTION    . TUBAL LIGATION      SOCIAL HISTORY: Social History   Tobacco Use  . Smoking status: Never Smoker  . Smokeless tobacco: Never Used  Substance Use Topics  . Alcohol use: No  . Drug use: No    FAMILY HISTORY: Family History  Problem Relation Age of Onset  . Heart disease Mother        pacemaker   . Hypertension Mother   . Hyperlipidemia Mother   . Obesity Mother    . Asthma Father   . Pancreatic cancer Brother   . Breast cancer Maternal Aunt   . Colon cancer Neg Hx     ROS: Review of Systems  Constitutional: Negative for weight loss.  Respiratory: Negative for shortness of breath.   Cardiovascular: Negative for chest pain.    PHYSICAL EXAM: Blood pressure (!) 142/77, pulse (!) 56, temperature 97.7 F (36.5 C), temperature source Oral, height 5\' 5"  (1.651 m), weight 189 lb (85.7 kg), SpO2 98 %. Body mass index is 31.45 kg/m. Physical Exam  Constitutional: She is oriented to person, place, and time. She appears well-developed and well-nourished.  Cardiovascular:  Bradycardic  Pulmonary/Chest: Effort normal.  Musculoskeletal: Normal range of motion.  Neurological: She is oriented to person, place, and time.  Skin: Skin is warm and dry.  Psychiatric: She has a normal mood and affect. Her behavior is normal.  Vitals reviewed.   RECENT LABS AND TESTS: BMET    Component Value Date/Time   NA 143 12/06/2016 0954   K 4.3 12/06/2016 0954   CL 104 12/06/2016 0954   CO2 26 12/06/2016 0954   GLUCOSE 103 (H) 12/06/2016 0954   GLUCOSE 100 (H) 06/03/2015 0747   BUN 23 12/06/2016 0954   CREATININE 0.85 12/06/2016 0954   CALCIUM 9.3 12/06/2016 0954   GFRNONAA 75 12/06/2016 0954   GFRAA 86 12/06/2016 0954   Lab Results  Component Value Date   HGBA1C 5.6 12/06/2016   HGBA1C 5.6 05/17/2016   HGBA1C 5.9 09/22/2015   HGBA1C 6.2 06/03/2015   HGBA1C 6.0 05/21/2014   Lab Results  Component Value Date   INSULIN 11.2 12/06/2016   INSULIN 9.8 05/17/2016   CBC    Component Value Date/Time   WBC 5.8 05/17/2016 0949   WBC 6.5 11/13/2012 1003   RBC 4.83 05/17/2016 0949   RBC 5.03 11/13/2012 1003   HGB 13.7 05/17/2016 0949   HCT 42.4 05/17/2016 0949   PLT 268.0 11/13/2012 1003   MCV 88 05/17/2016 0949   MCH 28.4 05/17/2016 0949   MCH 26.9 (A) 05/20/2012 1404   MCHC 32.3 05/17/2016 0949   MCHC 33.5 11/13/2012 1003   RDW 14.2 05/17/2016  0949   LYMPHSABS 1.7 05/17/2016 0949   MONOABS 0.4 11/13/2012 1003   EOSABS 0.2 05/17/2016 0949   BASOSABS 0.0 05/17/2016 0949   Iron/TIBC/Ferritin/ %Sat    Component Value Date/Time   IRON 88 05/20/2012 1422   TIBC 303 05/20/2012 1422   FERRITIN 80 05/20/2012 1422   IRONPCTSAT 29 05/20/2012 1422   Lipid Panel     Component Value Date/Time   CHOL 162 05/17/2016 0949   TRIG 50 05/17/2016 0949   HDL 57 05/17/2016 0949  CHOLHDL 3 06/03/2015 0747   VLDL 9.0 06/03/2015 0747   LDLCALC 95 05/17/2016 0949   Hepatic Function Panel     Component Value Date/Time   PROT 7.1 12/06/2016 0954   ALBUMIN 4.0 12/06/2016 0954   AST 21 12/06/2016 0954   ALT 22 12/06/2016 0954   ALKPHOS 97 12/06/2016 0954   BILITOT 0.4 12/06/2016 0954   BILIDIR 0.1 11/13/2012 1003      Component Value Date/Time   TSH 1.770 05/17/2016 0949   TSH 1.23 11/13/2012 1003   TSH 2.26 11/30/2011 1210    ASSESSMENT AND PLAN: Essential hypertension  Class 1 obesity with serious comorbidity and body mass index (BMI) of 31.0 to 31.9 in adult, unspecified obesity type  PLAN:  Hypertension We discussed sodium restriction, working on healthy weight loss, and a regular exercise program as the means to achieve improved blood pressure control. Tamicka agreed with this plan and agreed to follow up as directed. We will continue to monitor her blood pressure as well as her progress with the above lifestyle modifications. Ashlley is advised to keep a blood pressure log and bring on next visit for review. She will continue her medications as prescribed and will watch for signs of hypotension as she continues her lifestyle modifications. Glendoris agrees to follow up with our clinic in 3 weeks.  We spent > than 50% of the 15 minute visit on the counseling as documented in the note.  Obesity Almendra is currently in the action stage of change. As such, her goal is to continue with weight loss efforts She has agreed to  keep a food journal with 1100-1300 calories and 80 grams of protein daily Shrinika has been instructed to work up to a goal of 150 minutes of combined cardio and strengthening exercise per week for weight loss and overall health benefits. We discussed the following Behavioral Modification Strategies today: increasing lean protein intake and holiday eating strategies    Izabelle has agreed to follow up with our clinic in 3 weeks. She was informed of the importance of frequent follow up visits to maximize her success with intensive lifestyle modifications for her multiple health conditions.  I, Trixie Dredge, am acting as transcriptionist for Lacy Duverney, PA-C  I have reviewed the above documentation for accuracy and completeness, and I agree with the above. -Lacy Duverney, PA-C  I have reviewed the above note and agree with the plan. -Dennard Nip, MD     Today's visit was # 14 out of 22.  Starting weight: 200 lbs Starting date: 189 lbs Today's weight : 189 lbs  Today's date: 12/28/2016 Total lbs lost to date: 11 (Patients must lose 7 lbs in the first 6 months to continue with counseling)   ASK: We discussed the diagnosis of obesity with [name] today and Jazelyn agreed to give [name] permission to discuss obesity behavioral modification therapy today.  ASSESS: Rubye has the diagnosis of obesity and her BMI today is 31.45 Mylisa is in the action stage of change   ADVISE: Wesleigh was educated on the multiple health risks of obesity as well as the benefit of weight loss to improve her health. She was advised of the need for long term treatment and the importance of lifestyle modifications.  AGREE: Multiple dietary modification options and treatment options were discussed and  Shanin agreed to keep a food journal with 1100-1300 calories and 80 grams of protein daily We discussed the following Behavioral Modification Strategies today: increasing lean protein intake  and  holiday eating strategies

## 2017-01-04 ENCOUNTER — Ambulatory Visit (INDEPENDENT_AMBULATORY_CARE_PROVIDER_SITE_OTHER): Payer: 59 | Admitting: Family Medicine

## 2017-01-16 ENCOUNTER — Ambulatory Visit (INDEPENDENT_AMBULATORY_CARE_PROVIDER_SITE_OTHER): Payer: 59 | Admitting: Family Medicine

## 2017-01-25 ENCOUNTER — Ambulatory Visit (INDEPENDENT_AMBULATORY_CARE_PROVIDER_SITE_OTHER): Payer: 59 | Admitting: Family Medicine

## 2017-01-25 VITALS — BP 115/74 | HR 60 | Temp 97.6°F | Ht 65.0 in | Wt 188.0 lb

## 2017-01-25 DIAGNOSIS — Z9189 Other specified personal risk factors, not elsewhere classified: Secondary | ICD-10-CM | POA: Diagnosis not present

## 2017-01-25 DIAGNOSIS — E669 Obesity, unspecified: Secondary | ICD-10-CM

## 2017-01-25 DIAGNOSIS — E559 Vitamin D deficiency, unspecified: Secondary | ICD-10-CM

## 2017-01-25 DIAGNOSIS — Z6831 Body mass index (BMI) 31.0-31.9, adult: Secondary | ICD-10-CM

## 2017-01-25 MED ORDER — VITAMIN D (ERGOCALCIFEROL) 1.25 MG (50000 UNIT) PO CAPS: 50000.0000 [IU] | ORAL_CAPSULE | ORAL | 0 refills | Status: DC

## 2017-01-25 MED FILL — VIT D2 1.25 MG (50,000 UNIT: 1.25 MG | 28 days supply | Qty: 4 | Fill #0

## 2017-01-25 NOTE — Progress Notes (Signed)
Office: [phone]  /  Fax: [phone]   HPI:   Chief Complaint: OBESITY Brenda Russo is here to discuss her progress with her obesity treatment plan. She is on the keep a food journal with 1100-1300 calories and 80 grams of protein daily and is following her eating plan approximately 75 % of the time. She states she is walking the stairs and exercise videos for 30 minutes 4 times per week. Brenda Russo continues to do well with weight loss. She is journaling on and off and mostly doing portion control and making smarter food choices, but making healthier options.  Her weight is 188 lb (85.3 kg) today and has had a weight loss of 1 pound over a period of 4 weeks since her last visit. She has lost 12 lbs since starting treatment with [name].  Vitamin D deficiency Brenda Russo has a diagnosis of vitamin D deficiency. She is on OTC Vit D, but not yet at goal. She denies nausea, vomiting or muscle weakness.  At risk for cardiovascular disease Brenda Russo is at a higher than average risk for cardiovascular disease due to obesity. She currently denies any chest pain.  ALLERGIES: Allergies  Allergen Reactions  . Eggs Or Egg-Derived Products     Shortness of breath, wheezing    MEDICATIONS: Current Outpatient Medications on File Prior to Visit  Medication Sig Dispense Refill  . albuterol (PROVENTIL) (2.5 MG/3ML) 0.083% nebulizer solution Take 3 mLs (2.5 mg total) by nebulization 3 (three) times daily as needed. 75 mL 6  . albuterol (VENTOLIN HFA) 108 (90 Base) MCG/ACT inhaler Inhale 1-2 puffs into the lungs every 4 (four) hours as needed for wheezing or shortness of breath. 54 g 1  . amLODipine (NORVASC) 5 MG tablet Take 0.5 tablets (2.5 mg total) by mouth daily. 15 tablet 8  . Ascorbic Acid (VITAMIN C) 1000 MG tablet Take 1,000 mg by mouth daily.    . B Complex-C-Folic Acid (B-COMPLEX BALANCED PO) Take by mouth.    Marland Kitchen buPROPion (WELLBUTRIN SR) 150 MG 12 hr tablet Take 150 mg by mouth daily.    .  Coenzyme Q10 (COQ10) 100 MG CAPS Take 1 capsule by mouth daily.    . fluticasone (FLONASE) 50 MCG/ACT nasal spray Place 2 sprays into both nostrils daily. 16 g 5  . fluticasone furoate-vilanterol (BREO ELLIPTA) 200-25 MCG/INH AEPB Inhale 1 puff into the lungs daily. 180 each 1  . losartan-hydrochlorothiazide (HYZAAR) 100-12.5 MG tablet Take 0.5 tablets by mouth daily. 15 tablet 8  . magnesium oxide (MAG-OX) 400 MG tablet 2 tablets daily. 400-800 mg daily    . Multiple Minerals-Vitamins (CALCIUM CITRATE PLUS/MAGNESIUM) TABS Take 1 tablet by mouth.    . Multiple Vitamins-Minerals (COMPLETE WOMENS PO) Take 1 capsule by mouth 2 (two) times daily.      . Omega-3 Fatty Acids (FISH OIL) 1000 MG CAPS Take 1 capsule by mouth.    . polyethylene glycol powder (GLYCOLAX/MIRALAX) powder Take 17 g by mouth daily. 3350 g 0   No current facility-administered medications on file prior to visit.     PAST MEDICAL HISTORY: Past Medical History:  Diagnosis Date  . Allergic rhinitis   . Anemia   . Anxiety   . Asthma   . HTN (hypertension)   . Hyperlipidemia    borderline  . Obese   . OSA (obstructive sleep apnea)    mild ,no CPAP  . Overweight(278.02)   . Plantar wart    history  . Prediabetes   . Shortness of  breath     PAST SURGICAL HISTORY: Past Surgical History:  Procedure Laterality Date  . BREAST BIOPSY Right 09/02/2015  . CESAREAN SECTION    . TUBAL LIGATION      SOCIAL HISTORY: Social History   Tobacco Use  . Smoking status: Never Smoker  . Smokeless tobacco: Never Used  Substance Use Topics  . Alcohol use: No  . Drug use: No    FAMILY HISTORY: Family History  Problem Relation Age of Onset  . Heart disease Mother        pacemaker   . Hypertension Mother   . Hyperlipidemia Mother   . Obesity Mother   . Asthma Father   . Pancreatic cancer Brother   . Breast cancer Maternal Aunt   . Colon cancer Neg Hx     ROS: Review of Systems  Constitutional: Positive for weight  loss.  Cardiovascular: Negative for chest pain.  Gastrointestinal: Negative for nausea and vomiting.  Musculoskeletal:       Negative muscle weakness    PHYSICAL EXAM: Blood pressure 115/74, pulse 60, temperature 97.6 F (36.4 C), temperature source Oral, height 5\' 5"  (1.651 m), weight 188 lb (85.3 kg), SpO2 99 %. Body mass index is 31.28 kg/m. Physical Exam  Constitutional: She is oriented to person, place, and time. She appears well-developed and well-nourished.  Cardiovascular: Normal rate.  Pulmonary/Chest: Effort normal.  Musculoskeletal: Normal range of motion.  Neurological: She is oriented to person, place, and time.  Skin: Skin is warm and dry.  Psychiatric: She has a normal mood and affect. Her behavior is normal.  Vitals reviewed.   RECENT LABS AND TESTS: BMET    Component Value Date/Time   NA 143 12/06/2016 0954   K 4.3 12/06/2016 0954   CL 104 12/06/2016 0954   CO2 26 12/06/2016 0954   GLUCOSE 103 (H) 12/06/2016 0954   GLUCOSE 100 (H) 06/03/2015 0747   BUN 23 12/06/2016 0954   CREATININE 0.85 12/06/2016 0954   CALCIUM 9.3 12/06/2016 0954   GFRNONAA 75 12/06/2016 0954   GFRAA 86 12/06/2016 0954   Lab Results  Component Value Date   HGBA1C 5.6 12/06/2016   HGBA1C 5.6 05/17/2016   HGBA1C 5.9 09/22/2015   HGBA1C 6.2 06/03/2015   HGBA1C 6.0 05/21/2014   Lab Results  Component Value Date   INSULIN 11.2 12/06/2016   INSULIN 9.8 05/17/2016   CBC    Component Value Date/Time   WBC 5.8 05/17/2016 0949   WBC 6.5 11/13/2012 1003   RBC 4.83 05/17/2016 0949   RBC 5.03 11/13/2012 1003   HGB 13.7 05/17/2016 0949   HCT 42.4 05/17/2016 0949   PLT 268.0 11/13/2012 1003   MCV 88 05/17/2016 0949   MCH 28.4 05/17/2016 0949   MCH 26.9 (A) 05/20/2012 1404   MCHC 32.3 05/17/2016 0949   MCHC 33.5 11/13/2012 1003   RDW 14.2 05/17/2016 0949   LYMPHSABS 1.7 05/17/2016 0949   MONOABS 0.4 11/13/2012 1003   EOSABS 0.2 05/17/2016 0949   BASOSABS 0.0 05/17/2016 0949     Iron/TIBC/Ferritin/ %Sat    Component Value Date/Time   IRON 88 05/20/2012 1422   TIBC 303 05/20/2012 1422   FERRITIN 80 05/20/2012 1422   IRONPCTSAT 29 05/20/2012 1422   Lipid Panel     Component Value Date/Time   CHOL 162 05/17/2016 0949   TRIG 50 05/17/2016 0949   HDL 57 05/17/2016 0949   CHOLHDL 3 06/03/2015 0747   VLDL 9.0 06/03/2015 0747   LDLCALC  95 05/17/2016 0949   Hepatic Function Panel     Component Value Date/Time   PROT 7.1 12/06/2016 0954   ALBUMIN 4.0 12/06/2016 0954   AST 21 12/06/2016 0954   ALT 22 12/06/2016 0954   ALKPHOS 97 12/06/2016 0954   BILITOT 0.4 12/06/2016 0954   BILIDIR 0.1 11/13/2012 1003      Component Value Date/Time   TSH 1.770 05/17/2016 0949   TSH 1.23 11/13/2012 1003   TSH 2.26 11/30/2011 1210    ASSESSMENT AND PLAN: Vitamin D deficiency - Plan: Vitamin D, Ergocalciferol, (DRISDOL) 50000 units CAPS capsule  At risk for heart disease  Class 1 obesity with serious comorbidity and body mass index (BMI) of 31.0 to 31.9 in adult, unspecified obesity type  PLAN:  Vitamin D Deficiency Brenda Russo was informed that low vitamin D levels contributes to fatigue and are associated with obesity, breast, and colon cancer. Brenda Russo agrees to discontinue OTC Vit D and start prescription Vit D @50 ,000 IU every week #4 with no refills. She will follow up for routine testing of vitamin D, at least 2-3 times per year. She was informed of the risk of over-replacement of vitamin D and agrees to not increase her dose unless he discusses this with [name] first. Brenda Russo agrees to follow up with our clinic in 3 weeks.  Cardiovascular risk counselling Brenda Russo was given extended (15 minutes) coronary artery disease prevention counseling today. She is 61 y.o. female and has risk factors for heart disease including obesity. We discussed intensive lifestyle modifications today with an emphasis on specific weight loss instructions and strategies. Pt was also  informed of the importance of increasing exercise and decreasing saturated fats to help prevent heart disease.  Obesity Kellin is currently in the action stage of change. As such, her goal is to continue with weight loss efforts She has agreed to keep a food journal with 1100-1300 calories and 80+ grams of  protein daily Brenda Russo has been instructed to work up to a goal of 150 minutes of combined cardio and strengthening exercise per week for weight loss and overall health benefits. We discussed the following Behavioral Modification Strategies today: increasing lean protein intake, holiday eating strategies, and celebration eating strategies    Brenda Russo has agreed to follow up with our clinic in 3 weeks. She was informed of the importance of frequent follow up visits to maximize her success with intensive lifestyle modifications for her multiple health conditions.  I, Brenda Russo, am acting as transcriptionist for Brenda Nip, MD  I have reviewed the above documentation for accuracy and completeness, and I agree with the above. -Brenda Nip, MD     Today's visit was # 15 out of 22.  Starting weight: 200 lbs Starting date: 05/17/16 Today's weight : 188 lbs  Today's date: 01/25/2017 Total lbs lost to date: 12 (Patients must lose 7 lbs in the first 6 months to continue with counseling)   ASK: We discussed the diagnosis of obesity with Brenda Russo today and Brenda Russo agreed to give [name] permission to discuss obesity behavioral modification therapy today.  ASSESS: Brenda Russo has the diagnosis of obesity and her BMI today is 31.28 Brenda Russo is in the action stage of change   ADVISE: Brenda Russo was educated on the multiple health risks of obesity as well as the benefit of weight loss to improve her health. She was advised of the need for long term treatment and the importance of lifestyle modifications.  AGREE: Multiple dietary modification options and treatment options  were  discussed and  Brenda Russo agreed to keep a food journal with 1100-1300 calories and 80+ grams of protein daily We discussed the following Behavioral Modification Strategies today: increasing lean protein intake, holiday eating strategies, and celebration eating strategies

## 2017-01-26 MED FILL — POLYETHYLENE GLYCOL 3350 PO: 90 days supply | Qty: 1581 | Fill #1

## 2017-02-15 ENCOUNTER — Ambulatory Visit (INDEPENDENT_AMBULATORY_CARE_PROVIDER_SITE_OTHER): Payer: 59 | Admitting: Family Medicine

## 2017-02-15 VITALS — BP 135/77 | HR 58 | Temp 97.9°F | Ht 65.0 in | Wt 189.0 lb

## 2017-02-15 DIAGNOSIS — E559 Vitamin D deficiency, unspecified: Secondary | ICD-10-CM | POA: Diagnosis not present

## 2017-02-15 DIAGNOSIS — Z6839 Body mass index (BMI) 39.0-39.9, adult: Secondary | ICD-10-CM

## 2017-02-15 MED ORDER — VITAMIN D (ERGOCALCIFEROL) 1.25 MG (50000 UNIT) PO CAPS: 50000.0000 [IU] | ORAL_CAPSULE | ORAL | 0 refills | Status: DC

## 2017-02-16 MED FILL — VIT D2 1.25 MG (50,000 UNIT: 1.25 MG | 28 days supply | Qty: 4 | Fill #0

## 2017-02-16 NOTE — Progress Notes (Signed)
Office: [phone]  /  Fax: [phone]   HPI:   Chief Complaint: OBESITY Brenda Russo is here to discuss her progress with her obesity treatment plan. She is on the keep a food journal with 1100-1300 calories and 80+ grams of protein daily and is following her eating plan approximately 70 % of the time. She states she is walking and exercise tapes for 60 minutes 2-3 times per week. Brenda Russo is not journaling much, mostly portion control and making smarter food choices. She is ready to get back on track. She especially struggles with evening journaling and eating out more.  Her weight is 189 lb (85.7 kg) today and has gained 1 pound since her last visit. She has lost 11 lbs since starting treatment with [name].  Vitamin D deficiency Brenda Russo has a diagnosis of vitamin D deficiency. She is stable on prescription Vit D, but not yet at goal. She denies nausea, vomiting or muscle weakness.  ALLERGIES: Allergies  Allergen Reactions  . Eggs Or Egg-Derived Products     Shortness of breath, wheezing    MEDICATIONS: Current Outpatient Medications on File Prior to Visit  Medication Sig Dispense Refill  . albuterol (PROVENTIL) (2.5 MG/3ML) 0.083% nebulizer solution Take 3 mLs (2.5 mg total) by nebulization 3 (three) times daily as needed. 75 mL 6  . albuterol (VENTOLIN HFA) 108 (90 Base) MCG/ACT inhaler Inhale 1-2 puffs into the lungs every 4 (four) hours as needed for wheezing or shortness of breath. 54 g 1  . amLODipine (NORVASC) 5 MG tablet Take 0.5 tablets (2.5 mg total) by mouth daily. 15 tablet 8  . Ascorbic Acid (VITAMIN C) 1000 MG tablet Take 1,000 mg by mouth daily.    . B Complex-C-Folic Acid (B-COMPLEX BALANCED PO) Take by mouth.    Marland Kitchen buPROPion (WELLBUTRIN SR) 200 MG 12 hr tablet Take 200 mg by mouth daily.    . Coenzyme Q10 (COQ10) 100 MG CAPS Take 1 capsule by mouth daily.    . fluticasone (FLONASE) 50 MCG/ACT nasal spray Place 2 sprays into both nostrils daily. 16 g 5  . fluticasone  furoate-vilanterol (BREO ELLIPTA) 200-25 MCG/INH AEPB Inhale 1 puff into the lungs daily. 180 each 1  . losartan-hydrochlorothiazide (HYZAAR) 100-12.5 MG tablet Take 0.5 tablets by mouth daily. 15 tablet 8  . magnesium oxide (MAG-OX) 400 MG tablet 2 tablets daily. 400-800 mg daily    . Multiple Minerals-Vitamins (CALCIUM CITRATE PLUS/MAGNESIUM) TABS Take 1 tablet by mouth.    . Multiple Vitamins-Minerals (COMPLETE WOMENS PO) Take 1 capsule by mouth 2 (two) times daily.      . Omega-3 Fatty Acids (FISH OIL) 1000 MG CAPS Take 1 capsule by mouth.    . polyethylene glycol powder (GLYCOLAX/MIRALAX) powder Take 17 g by mouth daily. 3350 g 0   No current facility-administered medications on file prior to visit.     PAST MEDICAL HISTORY: Past Medical History:  Diagnosis Date  . Allergic rhinitis   . Anemia   . Anxiety   . Asthma   . HTN (hypertension)   . Hyperlipidemia    borderline  . Obese   . OSA (obstructive sleep apnea)    mild ,no CPAP  . Overweight(278.02)   . Plantar wart    history  . Prediabetes   . Shortness of breath     PAST SURGICAL HISTORY: Past Surgical History:  Procedure Laterality Date  . BREAST BIOPSY Right 09/02/2015  . CESAREAN SECTION    . TUBAL LIGATION  SOCIAL HISTORY: Social History   Tobacco Use  . Smoking status: Never Smoker  . Smokeless tobacco: Never Used  Substance Use Topics  . Alcohol use: No  . Drug use: No    FAMILY HISTORY: Family History  Problem Relation Age of Onset  . Heart disease Mother        pacemaker   . Hypertension Mother   . Hyperlipidemia Mother   . Obesity Mother   . Asthma Father   . Pancreatic cancer Brother   . Breast cancer Maternal Aunt   . Colon cancer Neg Hx     ROS: Review of Systems  Constitutional: Negative for weight loss.  Gastrointestinal: Negative for nausea and vomiting.  Musculoskeletal:       Negative muscle weakness    PHYSICAL EXAM: Blood pressure 135/77, pulse (!) 58,  temperature 97.9 F (36.6 C), temperature source Oral, height 5\' 5"  (1.651 m), weight 189 lb (85.7 kg), SpO2 98 %. Body mass index is 31.45 kg/m. Physical Exam  Constitutional: She is oriented to person, place, and time. She appears well-developed and well-nourished.  Cardiovascular: Normal rate.  Pulmonary/Chest: Effort normal.  Musculoskeletal: Normal range of motion.  Neurological: She is oriented to person, place, and time.  Skin: Skin is warm and dry.  Psychiatric: She has a normal mood and affect. Her behavior is normal.  Vitals reviewed.   RECENT LABS AND TESTS: BMET    Component Value Date/Time   NA 143 12/06/2016 0954   K 4.3 12/06/2016 0954   CL 104 12/06/2016 0954   CO2 26 12/06/2016 0954   GLUCOSE 103 (H) 12/06/2016 0954   GLUCOSE 100 (H) 06/03/2015 0747   BUN 23 12/06/2016 0954   CREATININE 0.85 12/06/2016 0954   CALCIUM 9.3 12/06/2016 0954   GFRNONAA 75 12/06/2016 0954   GFRAA 86 12/06/2016 0954   Lab Results  Component Value Date   HGBA1C 5.6 12/06/2016   HGBA1C 5.6 05/17/2016   HGBA1C 5.9 09/22/2015   HGBA1C 6.2 06/03/2015   HGBA1C 6.0 05/21/2014   Lab Results  Component Value Date   INSULIN 11.2 12/06/2016   INSULIN 9.8 05/17/2016   CBC    Component Value Date/Time   WBC 5.8 05/17/2016 0949   WBC 6.5 11/13/2012 1003   RBC 4.83 05/17/2016 0949   RBC 5.03 11/13/2012 1003   HGB 13.7 05/17/2016 0949   HCT 42.4 05/17/2016 0949   PLT 268.0 11/13/2012 1003   MCV 88 05/17/2016 0949   MCH 28.4 05/17/2016 0949   MCH 26.9 (A) 05/20/2012 1404   MCHC 32.3 05/17/2016 0949   MCHC 33.5 11/13/2012 1003   RDW 14.2 05/17/2016 0949   LYMPHSABS 1.7 05/17/2016 0949   MONOABS 0.4 11/13/2012 1003   EOSABS 0.2 05/17/2016 0949   BASOSABS 0.0 05/17/2016 0949   Iron/TIBC/Ferritin/ %Sat    Component Value Date/Time   IRON 88 05/20/2012 1422   TIBC 303 05/20/2012 1422   FERRITIN 80 05/20/2012 1422   IRONPCTSAT 29 05/20/2012 1422   Lipid Panel       Component Value Date/Time   CHOL 162 05/17/2016 0949   TRIG 50 05/17/2016 0949   HDL 57 05/17/2016 0949   CHOLHDL 3 06/03/2015 0747   VLDL 9.0 06/03/2015 0747   LDLCALC 95 05/17/2016 0949   Hepatic Function Panel     Component Value Date/Time   PROT 7.1 12/06/2016 0954   ALBUMIN 4.0 12/06/2016 0954   AST 21 12/06/2016 0954   ALT 22 12/06/2016 0954  ALKPHOS 97 12/06/2016 0954   BILITOT 0.4 12/06/2016 0954   BILIDIR 0.1 11/13/2012 1003      Component Value Date/Time   TSH 1.770 05/17/2016 0949   TSH 1.23 11/13/2012 1003   TSH 2.26 11/30/2011 1210  Results for AMESHA, BAILEY (MRN [id number]) as of 02/16/2017 08:22  Ref. Range 12/06/2016 09:54  Vitamin D, 25-Hydroxy Latest Ref Range: 30.0 - 100.0 ng/mL 42.9    ASSESSMENT AND PLAN: Vitamin D deficiency - Plan: Vitamin D, Ergocalciferol, (DRISDOL) 50000 units CAPS capsule  Class 2 severe obesity with serious comorbidity and body mass index (BMI) of 39.0 to 39.9 in adult, unspecified obesity type (HCC)  PLAN:  Vitamin D Deficiency Millissa was informed that low vitamin D levels contributes to fatigue and are associated with obesity, breast, and colon cancer. Siah agrees to continue taking prescription Vit D @50 ,000 IU every week #4 and we will refill for 1 month. She will follow up for routine testing of vitamin D, at least 2-3 times per year. She was informed of the risk of over-replacement of vitamin D and agrees to not increase her dose unless he discusses this with [name] first. Katye agrees to follow up with our clinic in 2 weeks.  Obesity Brenda Russo is currently in the action stage of change. As such, her goal is to continue with weight loss efforts She has agreed to keep a food journal with 1300 calories and 80+ grams of protein daily Princetta has been instructed to work up to a goal of 150 minutes of combined cardio and strengthening exercise per week for weight loss and overall health benefits. We discussed the  following Behavioral Modification Strategies today: increasing lean protein intake, decrease eating out and work on meal planning and easy cooking plans, and planning for success   Brenda Russo has agreed to follow up with our clinic in 2 weeks. She was informed of the importance of frequent follow up visits to maximize her success with intensive lifestyle modifications for her multiple health conditions.   OBESITY BEHAVIORAL INTERVENTION VISIT  Today's visit was # 16 out of 22.  Starting weight: 200 lbs Starting date: 05/17/16 Today's weight : 189 lbs  Today's date: 02/15/2017 Total lbs lost to date: 11 (Patients must lose 7 lbs in the first 6 months to continue with counseling)   ASK: We discussed the diagnosis of obesity with [name] today and Nazanin agreed to give [name] permission to discuss obesity behavioral modification therapy today.  ASSESS: Mary-Ann has the diagnosis of obesity and her BMI today is 31.45 Annaleese is in the action stage of change   ADVISE: Jarelyn was educated on the multiple health risks of obesity as well as the benefit of weight loss to improve her health. She was advised of the need for long term treatment and the importance of lifestyle modifications.  AGREE: Multiple dietary modification options and treatment options were discussed and  Mayla agreed to the above obesity treatment plan.  I, Trixie Dredge, am acting as transcriptionist for Dennard Nip, MD  I have reviewed the above documentation for accuracy and completeness, and I agree with the above. -Dennard Nip, MD

## 2017-02-27 ENCOUNTER — Ambulatory Visit (INDEPENDENT_AMBULATORY_CARE_PROVIDER_SITE_OTHER): Payer: 59 | Admitting: Family Medicine

## 2017-02-27 VITALS — BP 131/72 | HR 65 | Temp 97.7°F | Ht 65.0 in | Wt 191.0 lb

## 2017-02-27 DIAGNOSIS — Z6831 Body mass index (BMI) 31.0-31.9, adult: Secondary | ICD-10-CM | POA: Diagnosis not present

## 2017-02-27 DIAGNOSIS — E669 Obesity, unspecified: Secondary | ICD-10-CM | POA: Diagnosis not present

## 2017-02-27 DIAGNOSIS — F3289 Other specified depressive episodes: Secondary | ICD-10-CM

## 2017-02-27 DIAGNOSIS — E559 Vitamin D deficiency, unspecified: Secondary | ICD-10-CM

## 2017-02-27 DIAGNOSIS — R7303 Prediabetes: Secondary | ICD-10-CM

## 2017-02-27 DIAGNOSIS — I10 Essential (primary) hypertension: Secondary | ICD-10-CM | POA: Diagnosis not present

## 2017-02-27 DIAGNOSIS — Z9189 Other specified personal risk factors, not elsewhere classified: Secondary | ICD-10-CM | POA: Diagnosis not present

## 2017-02-27 NOTE — Progress Notes (Signed)
Office: [phone]  /  Fax: [phone]   HPI:   Chief Complaint: OBESITY Brenda Russo is here to discuss her progress with her obesity treatment plan. She is on the keep a food journal with 1300 calories and 80+ grams of protein daily and is following her eating plan approximately 50 % of the time. She states she is doing exercise video and walking for 30 minutes 2 times per week. Brenda Russo had 2 parties that had numerous finger foods that were hard to know the ingredients of, and has been snacking on cashews at work. Brenda Russo is looking for a little more structure. Her weight is 191 lb (86.6 kg) today and has had a weight gain of 2 pounds over a period of 2 weeks since her last visit. She has lost 9 lbs since starting treatment with Brenda Russo.  Hypertension Brenda Russo is a 62 y.o. female with hypertension.  [name] denies chest pain or shortness of breath on exertion. She is working weight loss to help control her blood pressure with the goal of decreasing her risk of heart attack and stroke. Alvarados blood pressure is well controlled on Amlodipine and Hyzaar.  Vitamin D deficiency [name] has a diagnosis of vitamin D deficiency. Brenda Russo is currently taking vit D and has improvement in fatigue. She denies nausea, vomiting or muscle weakness.   Ref. Range 12/06/2016 09:54  Vitamin D, 25-Hydroxy Latest Ref Range: 30.0 - 100.0 ng/mL 42.9   Pre-Diabetes Brenda Russo has a diagnosis of pre-diabetes based on her elevated Hgb A1c and was informed this puts her at greater risk of developing diabetes. She is not taking metformin currently. She is controlling her blood sugar with diet and continues to work on diet and exercise to decrease risk of diabetes. She denies nausea or hypoglycemia.  At risk for diabetes Brenda Russo is at higher than average risk for developing diabetes due to her obesity and pre-diabetes. She currently denies polyuria or polydipsia.  ALLERGIES: Allergies    Allergen Reactions  . Eggs Or Egg-Derived Products     Shortness of breath, wheezing    MEDICATIONS: Current Outpatient Medications on File Prior to Visit  Medication Sig Dispense Refill  . albuterol (PROVENTIL) (2.5 MG/3ML) 0.083% nebulizer solution Take 3 mLs (2.5 mg total) by nebulization 3 (three) times daily as needed. 75 mL 6  . albuterol (VENTOLIN HFA) 108 (90 Base) MCG/ACT inhaler Inhale 1-2 puffs into the lungs every 4 (four) hours as needed for wheezing or shortness of breath. 54 g 1  . amLODipine (NORVASC) 5 MG tablet Take 0.5 tablets (2.5 mg total) by mouth daily. 15 tablet 8  . Ascorbic Acid (VITAMIN C) 1000 MG tablet Take 1,000 mg by mouth daily.    . B Complex-C-Folic Acid (B-COMPLEX BALANCED PO) Take by mouth.    Marland Kitchen buPROPion (WELLBUTRIN SR) 200 MG 12 hr tablet Take 200 mg by mouth daily.    . Coenzyme Q10 (COQ10) 100 MG CAPS Take 1 capsule by mouth daily.    . fluticasone (FLONASE) 50 MCG/ACT nasal spray Place 2 sprays into both nostrils daily. 16 g 5  . fluticasone furoate-vilanterol (BREO ELLIPTA) 200-25 MCG/INH AEPB Inhale 1 puff into the lungs daily. 180 each 1  . losartan-hydrochlorothiazide (HYZAAR) 100-12.5 MG tablet Take 0.5 tablets by mouth daily. 15 tablet 8  . magnesium oxide (MAG-OX) 400 MG tablet 2 tablets daily. 400-800 mg daily    . Multiple Minerals-Vitamins (CALCIUM CITRATE PLUS/MAGNESIUM) TABS Take 1 tablet by mouth.    Marland Kitchen  Multiple Vitamins-Minerals (COMPLETE WOMENS PO) Take 1 capsule by mouth 2 (two) times daily.      . Omega-3 Fatty Acids (FISH OIL) 1000 MG CAPS Take 1 capsule by mouth.    . polyethylene glycol powder (GLYCOLAX/MIRALAX) powder Take 17 g by mouth daily. 3350 g 0  . Vitamin D, Ergocalciferol, (DRISDOL) 50000 units CAPS capsule Take 1 capsule (50,000 Units total) by mouth every 7 (seven) days. 4 capsule 0   No current facility-administered medications on file prior to visit.     PAST MEDICAL HISTORY: Past Medical History:  Diagnosis  Date  . Allergic rhinitis   . Anemia   . Anxiety   . Asthma   . HTN (hypertension)   . Hyperlipidemia    borderline  . Obese   . OSA (obstructive sleep apnea)    mild ,no CPAP  . Overweight(278.02)   . Plantar wart    history  . Prediabetes   . Shortness of breath     PAST SURGICAL HISTORY: Past Surgical History:  Procedure Laterality Date  . BREAST BIOPSY Right 09/02/2015  . CESAREAN SECTION    . TUBAL LIGATION      SOCIAL HISTORY: Social History   Tobacco Use  . Smoking status: Never Smoker  . Smokeless tobacco: Never Used  Substance Use Topics  . Alcohol use: No  . Drug use: No    FAMILY HISTORY: Family History  Problem Relation Age of Onset  . Heart disease Mother        pacemaker   . Hypertension Mother   . Hyperlipidemia Mother   . Obesity Mother   . Asthma Father   . Pancreatic cancer Brother   . Breast cancer Maternal Aunt   . Colon cancer Neg Hx     ROS: Review of Systems  Constitutional: Positive for malaise/fatigue. Negative for weight loss.  Respiratory: Negative for shortness of breath (on exertion).   Cardiovascular: Negative for chest pain.  Gastrointestinal: Negative for nausea and vomiting.  Genitourinary: Negative for frequency.  Musculoskeletal:       Negative muscle weakness  Endo/Heme/Allergies: Negative for polydipsia.       Negative hypoglycemia    PHYSICAL EXAM: Blood pressure 131/72, pulse 65, temperature 97.7 F (36.5 C), temperature source Oral, height 5\' 5"  (1.651 m), weight 191 lb (86.6 kg), SpO2 94 %. Body mass index is 31.78 kg/m. Physical Exam  Constitutional: She is oriented to person, place, and time. She appears well-developed and well-nourished.  Cardiovascular: Normal rate.  Pulmonary/Chest: Effort normal.  Musculoskeletal: Normal range of motion.  Neurological: She is oriented to person, place, and time.  Skin: Skin is warm and dry.  Psychiatric: She has a normal mood and affect. Her behavior is normal.   Vitals reviewed.   RECENT LABS AND TESTS: BMET    Component Value Date/Time   NA 143 12/06/2016 0954   K 4.3 12/06/2016 0954   CL 104 12/06/2016 0954   CO2 26 12/06/2016 0954   GLUCOSE 103 (H) 12/06/2016 0954   GLUCOSE 100 (H) 06/03/2015 0747   BUN 23 12/06/2016 0954   CREATININE 0.85 12/06/2016 0954   CALCIUM 9.3 12/06/2016 0954   GFRNONAA 75 12/06/2016 0954   GFRAA 86 12/06/2016 0954   Lab Results  Component Value Date   HGBA1C 5.6 12/06/2016   HGBA1C 5.6 05/17/2016   HGBA1C 5.9 09/22/2015   HGBA1C 6.2 06/03/2015   HGBA1C 6.0 05/21/2014   Lab Results  Component Value Date   INSULIN 11.2 12/06/2016  INSULIN 9.8 05/17/2016   CBC    Component Value Date/Time   WBC 5.8 05/17/2016 0949   WBC 6.5 11/13/2012 1003   RBC 4.83 05/17/2016 0949   RBC 5.03 11/13/2012 1003   HGB 13.7 05/17/2016 0949   HCT 42.4 05/17/2016 0949   PLT 268.0 11/13/2012 1003   MCV 88 05/17/2016 0949   MCH 28.4 05/17/2016 0949   MCH 26.9 (A) 05/20/2012 1404   MCHC 32.3 05/17/2016 0949   MCHC 33.5 11/13/2012 1003   RDW 14.2 05/17/2016 0949   LYMPHSABS 1.7 05/17/2016 0949   MONOABS 0.4 11/13/2012 1003   EOSABS 0.2 05/17/2016 0949   BASOSABS 0.0 05/17/2016 0949   Iron/TIBC/Ferritin/ %Sat    Component Value Date/Time   IRON 88 05/20/2012 1422   TIBC 303 05/20/2012 1422   FERRITIN 80 05/20/2012 1422   IRONPCTSAT 29 05/20/2012 1422   Lipid Panel     Component Value Date/Time   CHOL 162 05/17/2016 0949   TRIG 50 05/17/2016 0949   HDL 57 05/17/2016 0949   CHOLHDL 3 06/03/2015 0747   VLDL 9.0 06/03/2015 0747   LDLCALC 95 05/17/2016 0949   Hepatic Function Panel     Component Value Date/Time   PROT 7.1 12/06/2016 0954   ALBUMIN 4.0 12/06/2016 0954   AST 21 12/06/2016 0954   ALT 22 12/06/2016 0954   ALKPHOS 97 12/06/2016 0954   BILITOT 0.4 12/06/2016 0954   BILIDIR 0.1 11/13/2012 1003      Component Value Date/Time   TSH 1.770 05/17/2016 0949   TSH 1.23 11/13/2012 1003    TSH 2.26 11/30/2011 1210     Ref. Range 12/06/2016 09:54  Vitamin D, 25-Hydroxy Latest Ref Range: 30.0 - 100.0 ng/mL 42.9    ASSESSMENT AND PLAN: Essential hypertension  Vitamin D deficiency  Prediabetes  Other depression - with emotional eating  At risk for diabetes mellitus  Class 1 obesity with serious comorbidity and body mass index (BMI) of 31.0 to 31.9 in adult, unspecified obesity type  PLAN:  Hypertension We discussed sodium restriction, working on healthy weight loss, and a regular exercise program as the means to achieve improved blood pressure control. Aracelia agreed with this plan and agreed to follow up as directed. We will continue to monitor her blood pressure as well as her progress with the above lifestyle modifications. She will continue her medications as prescribed and will watch for signs of hypotension as she continues her lifestyle modifications.  Vitamin D Deficiency Kinsly was informed that low vitamin D levels contributes to fatigue and are associated with obesity, breast, and colon cancer. She agrees to continue to take prescription Vit D @50 ,000 IU every week and will follow up for routine testing of vitamin D, at least 2-3 times per year. She was informed of the risk of over-replacement of vitamin D and agrees to not increase her dose unless she discusses this with Brenda Russo first.  Pre-Diabetes Kaleya will continue on the category 2 plan. She will continue to work on weight loss, exercise, and decreasing simple carbohydrates in her diet to help decrease the risk of diabetes. She was informed that eating too many simple carbohydrates or too many calories at one sitting increases the likelihood of GI side effects. Fallan agreed to follow up with Brenda Russo as directed to monitor her progress.  Diabetes risk counseling Leelah was given extended (15 minutes) diabetes prevention counseling today. She is 62 y.o. female and has risk factors for diabetes including  obesity and pre-diabetes. We  discussed intensive lifestyle modifications today with an emphasis on weight loss as well as increasing exercise and decreasing simple carbohydrates in her diet.  Obesity Gloriann is currently in the action stage of change. As such, her goal is to continue with weight loss efforts She has agreed to follow the Category 2 plan Catalia has been instructed to work up to a goal of 150 minutes of combined cardio and strengthening exercise per week for weight loss and overall health benefits. We discussed the following Behavioral Modification Strategies today: work on meal planning and easy cooking plans and better snacking choices  Cyrstal has agreed to follow up with our clinic in 2 weeks. She was informed of the importance of frequent follow up visits to maximize her success with intensive lifestyle modifications for her multiple health conditions.   OBESITY BEHAVIORAL INTERVENTION VISIT  Today's visit was # 17 out of 22.  Starting weight: 200 lbs Starting date: 05/17/16 Today's weight : 191 lbs Today's date: 02/27/2017 Total lbs lost to date: 9 (Patients must lose 7 lbs in the first 6 months to continue with counseling)   ASK: We discussed the diagnosis of obesity with [name] today and Camiyah agreed to give Brenda Russo permission to discuss obesity behavioral modification therapy today.  ASSESS: Chardonay has the diagnosis of obesity and her BMI today is 31.78 Spruha is in the action stage of change   ADVISE: Maquita was educated on the multiple health risks of obesity as well as the benefit of weight loss to improve her health. She was advised of the need for long term treatment and the importance of lifestyle modifications.  AGREE: Multiple dietary modification options and treatment options were discussed and  Nayanna agreed to the above obesity treatment plan.  I, Doreene Nest, am acting as transcriptionist for Dennard Nip, MD  I have  reviewed the above documentation for accuracy and completeness, and I agree with the above. -Dennard Nip, MD

## 2017-03-13 ENCOUNTER — Ambulatory Visit (INDEPENDENT_AMBULATORY_CARE_PROVIDER_SITE_OTHER): Payer: 59 | Admitting: Family Medicine

## 2017-03-13 VITALS — BP 129/76 | HR 57 | Temp 98.2°F | Ht 65.0 in | Wt 190.0 lb

## 2017-03-13 DIAGNOSIS — E669 Obesity, unspecified: Secondary | ICD-10-CM | POA: Diagnosis not present

## 2017-03-13 DIAGNOSIS — E559 Vitamin D deficiency, unspecified: Secondary | ICD-10-CM

## 2017-03-13 DIAGNOSIS — Z9189 Other specified personal risk factors, not elsewhere classified: Secondary | ICD-10-CM

## 2017-03-13 DIAGNOSIS — F3289 Other specified depressive episodes: Secondary | ICD-10-CM

## 2017-03-13 DIAGNOSIS — Z6831 Body mass index (BMI) 31.0-31.9, adult: Secondary | ICD-10-CM

## 2017-03-13 MED ORDER — BUPROPION HCL ER (SR) 200 MG PO TB12: 200.0000 mg | ORAL_TABLET | Freq: Every day | ORAL | 0 refills | Status: DC

## 2017-03-13 MED ORDER — VITAMIN D (ERGOCALCIFEROL) 1.25 MG (50000 UNIT) PO CAPS: 50000.0000 [IU] | ORAL_CAPSULE | ORAL | 0 refills | Status: DC

## 2017-03-13 MED FILL — VIT D2 1.25 MG (50,000 UNIT: 1.25 MG | 28 days supply | Qty: 4 | Fill #0

## 2017-03-13 MED FILL — BUPROPION HCL SR 200 MG TAB: 200 | 30 days supply | Qty: 30 | Fill #0

## 2017-03-13 NOTE — Progress Notes (Signed)
Office: [phone]  /  Fax: [phone]   HPI:   Chief Complaint: OBESITY Brenda Russo is here to discuss her progress with her obesity treatment plan. She is on the Category 2 plan and is following her eating plan approximately 50-75 % of the time. She states she is walking and doing exercising tapes for 45 minutes 3 times a week.   Brenda Russo finds herself eating out, and then drastically limiting calories the following day. She is having a hard time following a category plan especially at work where she finds herself snacking.  Her weight is 190 lb (86.2 kg) today and has had a weight loss of 1 pound over a period of 2 weeks since her last visit. She has lost 10 lbs since starting treatment with [name].  Vitamin D deficiency Brenda Russo has a diagnosis of vitamin D deficiency. She is currently taking vit D and admits to fatigue, but denies nausea, vomiting or muscle weakness.   Ref. Range 12/06/2016 09:54  Vitamin D, 25-Hydroxy Latest Ref Range: 30.0 - 100.0 ng/mL 42.9   Depression with emotional eating behaviors Brenda Russo is struggling with emotional eating and using food for comfort to the extent that it is negatively impacting her Russo. She often snacks when she is not hungry. Brenda Russo sometimes feels she is out of control and then feels guilty that she made poor food choices. She has been working on behavior modification techniques to help reduce her emotional eating and has been somewhat successful. She shows no sign of suicidal or homicidal ideations. She did question whether Wellbutrin is controlling urges, but then agrees later that snacking because of boredom is a big problem at work.   Depression screen Brenda Russo 2/9 05/17/2016 04/12/2016 12/22/2015 11/21/2015 11/18/2015  Decreased Interest 0 0 0 0 0  Down, Depressed, Hopeless 1 0 0 0 0  PHQ - 2 Score 1 0 0 0 0  Altered sleeping 2 - - - -  Tired, decreased energy 1 - - - -  Change in appetite 2 - - - -  Feeling bad or failure about yourself   0 - - - -  Trouble concentrating 2 - - - -  Moving slowly or fidgety/restless 0 - - - -  Suicidal thoughts 0 - - - -  PHQ-9 Score 8 - - - -   At risk for osteopenia and osteoporosis Brenda Russo is at higher risk of osteopenia and osteoporosis due to vitamin D deficiency.    ALLERGIES: Allergies  Allergen Reactions  . Eggs Or Egg-Derived Products     Shortness of breath, wheezing    MEDICATIONS: Current Outpatient Medications on File Prior to Visit  Medication Sig Dispense Refill  . albuterol (PROVENTIL) (2.5 MG/3ML) 0.083% nebulizer solution Take 3 mLs (2.5 mg total) by nebulization 3 (Brenda) times daily as needed. 75 mL 6  . albuterol (VENTOLIN HFA) 108 (90 Base) MCG/ACT inhaler Inhale 1-2 puffs into the lungs every 4 (four) hours as needed for wheezing or shortness of breath. 54 g 1  . amLODipine (NORVASC) 5 MG tablet Take 0.5 tablets (2.5 mg total) by mouth daily. 15 tablet 8  . Ascorbic Acid (VITAMIN C) 1000 MG tablet Take 1,000 mg by mouth daily.    Marland Kitchen buPROPion (WELLBUTRIN SR) 200 MG 12 hr tablet Take 200 mg by mouth daily.    . Coenzyme Q10 (COQ10) 100 MG CAPS Take 1 capsule by mouth daily.    . fluticasone (FLONASE) 50 MCG/ACT nasal spray Place 2 sprays into both nostrils  daily. 16 g 5  . fluticasone furoate-vilanterol (BREO ELLIPTA) 200-25 MCG/INH AEPB Inhale 1 puff into the lungs daily. 180 each 1  . losartan-hydrochlorothiazide (HYZAAR) 100-12.5 MG tablet Take 0.5 tablets by mouth daily. 15 tablet 8  . magnesium oxide (MAG-OX) 400 MG tablet 2 tablets daily. 400-800 mg daily    . Multiple Minerals-Vitamins (CALCIUM CITRATE PLUS/MAGNESIUM) TABS Take 1 tablet by mouth.    . Multiple Vitamins-Minerals (COMPLETE WOMENS PO) Take 1 capsule by mouth 2 (two) times daily.      . Omega-3 Fatty Acids (FISH OIL) 1000 MG CAPS Take 1 capsule by mouth.    . polyethylene glycol powder (GLYCOLAX/MIRALAX) powder Take 17 g by mouth daily. 3350 g 0  . Vitamin D, Ergocalciferol, (DRISDOL) 50000  units CAPS capsule Take 1 capsule (50,000 Units total) by mouth every 7 (seven) days. 4 capsule 0   No current facility-administered medications on file prior to visit.     PAST MEDICAL HISTORY: Past Medical History:  Diagnosis Date  . Allergic rhinitis   . Anemia   . Anxiety   . Asthma   . HTN (hypertension)   . Hyperlipidemia    borderline  . Obese   . OSA (obstructive sleep apnea)    mild ,no CPAP  . Overweight(278.02)   . Plantar wart    history  . Prediabetes   . Shortness of breath     PAST SURGICAL HISTORY: Past Surgical History:  Procedure Laterality Date  . BREAST BIOPSY Right 09/02/2015  . CESAREAN SECTION    . TUBAL LIGATION      SOCIAL HISTORY: Social History   Tobacco Use  . Smoking status: Never Smoker  . Smokeless tobacco: Never Used  Substance Use Topics  . Alcohol use: No  . Drug use: No    FAMILY HISTORY: Family History  Problem Relation Age of Onset  . Heart disease Mother        pacemaker   . Hypertension Mother   . Hyperlipidemia Mother   . Obesity Mother   . Asthma Father   . Pancreatic cancer Brother   . Breast cancer Maternal Aunt   . Colon cancer Neg Hx     ROS: Review of Systems  Constitutional: Positive for malaise/fatigue and weight loss.  Gastrointestinal: Negative for nausea and vomiting.  Musculoskeletal:       Negative for muscle weakness  Psychiatric/Behavioral: Positive for depression. Negative for hallucinations and suicidal ideas.    PHYSICAL EXAM: Blood pressure 129/76, pulse (!) 57, temperature 98.2 F (36.8 C), temperature source Oral, height 5\' 5"  (1.651 m), weight 190 lb (86.2 kg), SpO2 94 %. Body mass index is 31.62 kg/m. Physical Exam  Constitutional: She is oriented to person, place, and time. She appears well-developed and well-nourished.  HENT:  Head: Normocephalic.  Eyes: EOM are normal.  Neck: Normal range of motion.  Cardiovascular: Normal rate.  Musculoskeletal: Normal range of motion.    Neurological: She is alert and oriented to person, place, and time.  Skin: Skin is warm and dry.  Psychiatric: She has a normal mood and affect. Her behavior is normal.  Vitals reviewed.   RECENT LABS AND TESTS: BMET    Component Value Date/Time   NA 143 12/06/2016 0954   K 4.3 12/06/2016 0954   CL 104 12/06/2016 0954   CO2 26 12/06/2016 0954   GLUCOSE 103 (H) 12/06/2016 0954   GLUCOSE 100 (H) 06/03/2015 0747   BUN 23 12/06/2016 0954   CREATININE 0.85 12/06/2016  0954   CALCIUM 9.3 12/06/2016 0954   GFRNONAA 75 12/06/2016 0954   GFRAA 86 12/06/2016 0954   Lab Results  Component Value Date   HGBA1C 5.6 12/06/2016   HGBA1C 5.6 05/17/2016   HGBA1C 5.9 09/22/2015   HGBA1C 6.2 06/03/2015   HGBA1C 6.0 05/21/2014   Lab Results  Component Value Date   INSULIN 11.2 12/06/2016   INSULIN 9.8 05/17/2016   CBC    Component Value Date/Time   WBC 5.8 05/17/2016 0949   WBC 6.5 11/13/2012 1003   RBC 4.83 05/17/2016 0949   RBC 5.03 11/13/2012 1003   HGB 13.7 05/17/2016 0949   HCT 42.4 05/17/2016 0949   PLT 268.0 11/13/2012 1003   MCV 88 05/17/2016 0949   MCH 28.4 05/17/2016 0949   MCH 26.9 (A) 05/20/2012 1404   MCHC 32.3 05/17/2016 0949   MCHC 33.5 11/13/2012 1003   RDW 14.2 05/17/2016 0949   LYMPHSABS 1.7 05/17/2016 0949   MONOABS 0.4 11/13/2012 1003   EOSABS 0.2 05/17/2016 0949   BASOSABS 0.0 05/17/2016 0949   Iron/TIBC/Ferritin/ %Sat    Component Value Date/Time   IRON 88 05/20/2012 1422   TIBC 303 05/20/2012 1422   FERRITIN 80 05/20/2012 1422   IRONPCTSAT 29 05/20/2012 1422   Lipid Panel     Component Value Date/Time   CHOL 162 05/17/2016 0949   TRIG 50 05/17/2016 0949   HDL 57 05/17/2016 0949   CHOLHDL 3 06/03/2015 0747   VLDL 9.0 06/03/2015 0747   LDLCALC 95 05/17/2016 0949   Hepatic Function Panel     Component Value Date/Time   PROT 7.1 12/06/2016 0954   ALBUMIN 4.0 12/06/2016 0954   AST 21 12/06/2016 0954   ALT 22 12/06/2016 0954   ALKPHOS 97  12/06/2016 0954   BILITOT 0.4 12/06/2016 0954   BILIDIR 0.1 11/13/2012 1003      Component Value Date/Time   TSH 1.770 05/17/2016 0949   TSH 1.23 11/13/2012 1003   TSH 2.26 11/30/2011 1210     Ref. Range 12/06/2016 09:54  Vitamin D, 25-Hydroxy Latest Ref Range: 30.0 - 100.0 ng/mL 42.9   ASSESSMENT AND PLAN: Vitamin D deficiency - Plan: Vitamin D, Ergocalciferol, (DRISDOL) 50000 units CAPS capsule  Other depression - with emotional eating - Plan: buPROPion (WELLBUTRIN SR) 200 MG 12 hr tablet  At risk for osteoporosis  Class 1 obesity with serious comorbidity and body mass index (BMI) of 31.0 to 31.9 in adult, unspecified obesity type  PLAN: Vitamin D Deficiency Brenda Russo was informed that low vitamin D levels contributes to fatigue and are associated with obesity, breast, and colon cancer. She agrees to continue to take prescription Vit D @50 ,000 IU every week #4 with no refills and will follow up for routine testing of vitamin D, at least 2-3 times per year. She was informed of the risk of over-replacement of vitamin D and agrees to not increase her dose unless she discusses this with [name] first. She agrees to follow up in our clinic in 2 weeks.   Depression with Emotional Eating Behaviors We discussed behavior modification techniques today to help Brenda Russo deal with her emotional eating and depression. She has agreed to continue Wellbutrin SR 200 mg qd # 30 with no refills and agreed to follow up as directed.  At risk for osteopenia and osteoporosis Brenda Russo is at risk for osteopenia and osteoporsis due to her vitamin D deficiency. She was encouraged to take her vitamin D and follow her higher calcium diet and  increase strengthening exercise to help strengthen her bones and decrease her risk of osteopenia and osteoporosis.  Obesity  Brenda Russo is currently in the action stage of change. As such, her goal is to continue with weight loss efforts She has agreed to keep a food journal  with 1100-1300 calories and 80 grams of protein daily.  Brenda Russo has been instructed to work up to a goal of 150 minutes of combined cardio and strengthening exercise per week for weight loss and overall Russo benefits. We discussed the following Behavioral Modification Strategies today: increasing lean protein intake, decreasing simple carbohydrates, better snacking choices, and keeping a strict food journal.    Brenda Russo has agreed to follow up with our clinic in 2 weeks. She was informed of the importance of frequent follow up visits to maximize her success with intensive lifestyle modifications for her multiple Russo conditions.   OBESITY BEHAVIORAL INTERVENTION VISIT  Today's visit was # 18 out of 22.  Starting weight: 200 lb Starting date: 05/17/16 Today's weight : 190lb Today's date: 03/13/2017 Total lbs lost to date: 10 (Patients must lose 7 lbs in the first 6 months to continue with counseling)   ASK: We discussed the diagnosis of obesity with Brenda Russo today and Brenda Russo agreed to give [name] permission to discuss obesity behavioral modification therapy today.  ASSESS: Brenda Russo has the diagnosis of obesity and her BMI today is 31.62 Brenda Russo is in the action stage of change   ADVISE: Brenda Russo was educated on the multiple Russo risks of obesity as well as the benefit of weight loss to improve her Russo. She was advised of the need for long term treatment and the importance of lifestyle modifications.  AGREE: Multiple dietary modification options and treatment options were discussed and  Brenda Russo agreed to the above obesity treatment plan.  I, Renee Ramus, am acting as transcriptionist for Ilene Qua, MD   I have reviewed the above documentation for accuracy and completeness, and I agree with the above. - Ilene Qua, MD

## 2017-03-27 ENCOUNTER — Ambulatory Visit (INDEPENDENT_AMBULATORY_CARE_PROVIDER_SITE_OTHER): Payer: 59 | Admitting: Family Medicine

## 2017-03-27 VITALS — BP 120/72 | HR 68 | Temp 98.0°F | Ht 65.0 in | Wt 189.0 lb

## 2017-03-27 DIAGNOSIS — I10 Essential (primary) hypertension: Secondary | ICD-10-CM

## 2017-03-27 DIAGNOSIS — E559 Vitamin D deficiency, unspecified: Secondary | ICD-10-CM

## 2017-03-27 DIAGNOSIS — Z6831 Body mass index (BMI) 31.0-31.9, adult: Secondary | ICD-10-CM

## 2017-03-27 DIAGNOSIS — E669 Obesity, unspecified: Secondary | ICD-10-CM

## 2017-03-27 NOTE — Progress Notes (Signed)
Office: [phone]  /  Fax: [phone]   HPI:   Chief Complaint: OBESITY Brenda Russo is here to discuss her progress with her obesity treatment plan. She is on the keep a food journal with 1100-1300 calories and 80 grams of protein daily and is following her eating plan approximately 50 % of the time. She states she is exercising for 30 minutes 2-3 times per week. Brenda Russo has been going to a few parties but has been trying to be mindful of getting protein in. She is looking for healthier lunch meat options, getting [place] jerky at [employer].  Her weight is 189 lb (85.7 kg) today and has had a weight loss of 1 pound over a period of 2 weeks since her last visit. She has lost 11 lbs since starting treatment with [name].  Vitamin D Deficiency Brenda Russo has a diagnosis of vitamin D deficiency. She is currently taking prescription Vit D and denies nausea, vomiting or muscle weakness.  Hypertension Brenda Russo is a 62 y.o. female with hypertension. Brenda Russo is on amlodipine and losartan-hydrochlorothiazide. She denies chest pain, chest pressure, or headache. Her blood pressure is controlled today. She is working weight loss to help control her blood pressure with the goal of decreasing her risk of heart attack and stroke. Brenda Russo's blood pressure is currently controlled.  ALLERGIES: Allergies  Allergen Reactions  . Eggs Or Egg-Derived Products     Shortness of breath, wheezing    MEDICATIONS: Current Outpatient Medications on File Prior to Visit  Medication Sig Dispense Refill  . albuterol (PROVENTIL) (2.5 MG/3ML) 0.083% nebulizer solution Take 3 mLs (2.5 mg total) by nebulization 3 (three) times daily as needed. 75 mL 6  . albuterol (VENTOLIN HFA) 108 (90 Base) MCG/ACT inhaler Inhale 1-2 puffs into the lungs every 4 (four) hours as needed for wheezing or shortness of breath. 54 g 1  . amLODipine (NORVASC) 5 MG tablet Take 0.5 tablets (2.5 mg total) by mouth daily. 15 tablet 8  . Ascorbic  Acid (VITAMIN C) 1000 MG tablet Take 1,000 mg by mouth daily.    Marland Kitchen buPROPion (WELLBUTRIN SR) 200 MG 12 hr tablet Take 1 tablet (200 mg total) by mouth daily. 30 tablet 0  . Coenzyme Q10 (COQ10) 100 MG CAPS Take 1 capsule by mouth daily.    . fluticasone (FLONASE) 50 MCG/ACT nasal spray Place 2 sprays into both nostrils daily. 16 g 5  . fluticasone furoate-vilanterol (BREO ELLIPTA) 200-25 MCG/INH AEPB Inhale 1 puff into the lungs daily. 180 each 1  . losartan-hydrochlorothiazide (HYZAAR) 100-12.5 MG tablet Take 0.5 tablets by mouth daily. 15 tablet 8  . magnesium oxide (MAG-OX) 400 MG tablet 2 tablets daily. 400-800 mg daily    . Multiple Minerals-Vitamins (CALCIUM CITRATE PLUS/MAGNESIUM) TABS Take 1 tablet by mouth.    . Multiple Vitamins-Minerals (COMPLETE WOMENS PO) Take 1 capsule by mouth 2 (two) times daily.      . Omega-3 Fatty Acids (FISH OIL) 1000 MG CAPS Take 1 capsule by mouth.    . polyethylene glycol powder (GLYCOLAX/MIRALAX) powder Take 17 g by mouth daily. 3350 g 0  . Vitamin D, Ergocalciferol, (DRISDOL) 50000 units CAPS capsule Take 1 capsule (50,000 Units total) by mouth every 7 (seven) days. 4 capsule 0   No current facility-administered medications on file prior to visit.     PAST MEDICAL HISTORY: Past Medical History:  Diagnosis Date  . Allergic rhinitis   . Anemia   . Anxiety   . Asthma   .  HTN (hypertension)   . Hyperlipidemia    borderline  . Obese   . OSA (obstructive sleep apnea)    mild ,no CPAP  . Overweight(278.02)   . Plantar wart    history  . Prediabetes   . Shortness of breath     PAST SURGICAL HISTORY: Past Surgical History:  Procedure Laterality Date  . BREAST BIOPSY Right 09/02/2015  . CESAREAN SECTION    . TUBAL LIGATION      SOCIAL HISTORY: Social History   Tobacco Use  . Smoking status: Never Smoker  . Smokeless tobacco: Never Used  Substance Use Topics  . Alcohol use: No  . Drug use: No    FAMILY HISTORY: Family History    Problem Relation Age of Onset  . Heart disease Mother        pacemaker   . Hypertension Mother   . Hyperlipidemia Mother   . Obesity Mother   . Asthma Father   . Pancreatic cancer Brother   . Breast cancer Maternal Aunt   . Colon cancer Neg Hx     ROS: Review of Systems  Constitutional: Positive for weight loss.  Cardiovascular: Negative for chest pain.       Negative chest pressure  Gastrointestinal: Negative for nausea and vomiting.  Musculoskeletal:       Negative muscle weakness  Neurological: Negative for headaches.    PHYSICAL EXAM: Blood pressure 120/72, pulse 68, temperature 98 F (36.7 C), temperature source Oral, height 5\' 5"  (1.651 m), weight 189 lb (85.7 kg), SpO2 96 %. Body mass index is 31.45 kg/m. Physical Exam  Constitutional: She is oriented to person, place, and time. She appears well-developed and well-nourished.  Cardiovascular: Normal rate.  Pulmonary/Chest: Effort normal.  Musculoskeletal: Normal range of motion.  Neurological: She is oriented to person, place, and time.  Skin: Skin is warm and dry.  Psychiatric: She has a normal mood and affect. Her behavior is normal.  Vitals reviewed.   RECENT LABS AND TESTS: BMET    Component Value Date/Time   NA 143 12/06/2016 0954   K 4.3 12/06/2016 0954   CL 104 12/06/2016 0954   CO2 26 12/06/2016 0954   GLUCOSE 103 (H) 12/06/2016 0954   GLUCOSE 100 (H) 06/03/2015 0747   BUN 23 12/06/2016 0954   CREATININE 0.85 12/06/2016 0954   CALCIUM 9.3 12/06/2016 0954   GFRNONAA 75 12/06/2016 0954   GFRAA 86 12/06/2016 0954   Lab Results  Component Value Date   HGBA1C 5.6 12/06/2016   HGBA1C 5.6 05/17/2016   HGBA1C 5.9 09/22/2015   HGBA1C 6.2 06/03/2015   HGBA1C 6.0 05/21/2014   Lab Results  Component Value Date   INSULIN 11.2 12/06/2016   INSULIN 9.8 05/17/2016   CBC    Component Value Date/Time   WBC 5.8 05/17/2016 0949   WBC 6.5 11/13/2012 1003   RBC 4.83 05/17/2016 0949   RBC 5.03  11/13/2012 1003   HGB 13.7 05/17/2016 0949   HCT 42.4 05/17/2016 0949   PLT 268.0 11/13/2012 1003   MCV 88 05/17/2016 0949   MCH 28.4 05/17/2016 0949   MCH 26.9 (A) 05/20/2012 1404   MCHC 32.3 05/17/2016 0949   MCHC 33.5 11/13/2012 1003   RDW 14.2 05/17/2016 0949   LYMPHSABS 1.7 05/17/2016 0949   MONOABS 0.4 11/13/2012 1003   EOSABS 0.2 05/17/2016 0949   BASOSABS 0.0 05/17/2016 0949   Iron/TIBC/Ferritin/ %Sat    Component Value Date/Time   IRON 88 05/20/2012 1422  TIBC 303 05/20/2012 1422   FERRITIN 80 05/20/2012 1422   IRONPCTSAT 29 05/20/2012 1422   Lipid Panel     Component Value Date/Time   CHOL 162 05/17/2016 0949   TRIG 50 05/17/2016 0949   HDL 57 05/17/2016 0949   CHOLHDL 3 06/03/2015 0747   VLDL 9.0 06/03/2015 0747   LDLCALC 95 05/17/2016 0949   Hepatic Function Panel     Component Value Date/Time   PROT 7.1 12/06/2016 0954   ALBUMIN 4.0 12/06/2016 0954   AST 21 12/06/2016 0954   ALT 22 12/06/2016 0954   ALKPHOS 97 12/06/2016 0954   BILITOT 0.4 12/06/2016 0954   BILIDIR 0.1 11/13/2012 1003      Component Value Date/Time   TSH 1.770 05/17/2016 0949   TSH 1.23 11/13/2012 1003   TSH 2.26 11/30/2011 1210   Results for NOVALYNN, BRANAMAN (MRN [id number]) as of 03/27/2017 16:11  Ref. Range 12/06/2016 09:54  Vitamin D, 25-Hydroxy Latest Ref Range: 30.0 - 100.0 ng/mL 42.9   ASSESSMENT AND PLAN: Vitamin D deficiency  Essential hypertension  Class 1 obesity with serious comorbidity and body mass index (BMI) of 31.0 to 31.9 in adult, unspecified obesity type  PLAN:  Vitamin D Deficiency Ebony was informed that low vitamin D levels contributes to fatigue and are associated with obesity, breast, and colon cancer. Vernica agrees to continue taking prescription Vit D @50 ,000 IU every week #4 and will follow up for routine testing of vitamin D, at least 2-3 times per year. She was informed of the risk of over-replacement of vitamin D and agrees to not  increase her dose unless she discusses this with [name] first. Labs to be ordered at next visit and Rosaly agrees to follow up with our clinic in 2 weeks.  Hypertension We discussed sodium restriction, working on healthy weight loss, and a regular exercise program as the means to achieve improved blood pressure control. Taniaya agreed with this plan and agreed to follow up as directed. We will continue to monitor her blood pressure as well as her progress with the above lifestyle modifications. She will continue her medications as prescribed and will watch for signs of hypotension as she continues her lifestyle modifications. Josslyn agrees to follow up with our clinic in 2 weeks.   We spent > than 50% of the 15 minute visit on the counseling as documented in the note.  Obesity Cyril is currently in the action stage of change. As such, her goal is to continue with weight loss efforts She has agreed to keep a food journal with 1300 calories and 75+ grams of protein daily Adalea has been instructed to work up to a goal of 150 minutes of combined cardio and strengthening exercise per week for weight loss and overall health benefits. We discussed the following Behavioral Modification Strategies today: increasing lean protein intake, work on meal planning and easy cooking plans, increase H20 intake, planning for success, and keep a strict food journal   Armanii has agreed to follow up with our clinic in 2 weeks. She was informed of the importance of frequent follow up visits to maximize her success with intensive lifestyle modifications for her multiple health conditions.   OBESITY BEHAVIORAL INTERVENTION VISIT  Today's visit was # 19 out of 22.  Starting weight: 200 lbs Starting date: 05/17/16 Today's weight : 189 lbs  Today's date: 03/27/2017 Total lbs lost to date: 11 (Patients must lose 7 lbs in the first 6 months to continue with counseling)  ASK: We discussed the diagnosis of  obesity with [name] today and Elizibeth agreed to give [name] permission to discuss obesity behavioral modification therapy today.  ASSESS: Margeaux has the diagnosis of obesity and her BMI today is 31.45 Keyuana is in the action stage of change   ADVISE: Ceci was educated on the multiple health risks of obesity as well as the benefit of weight loss to improve her health. She was advised of the need for long term treatment and the importance of lifestyle modifications.  AGREE: Multiple dietary modification options and treatment options were discussed and  Skyelyn agreed to the above obesity treatment plan.  I, Trixie Dredge, am acting as transcriptionist for Ilene Qua, MD  I have reviewed the above documentation for accuracy and completeness, and I agree with the above. - Ilene Qua, MD

## 2017-03-31 ENCOUNTER — Encounter: Payer: Self-pay | Admitting: Internal Medicine

## 2017-04-10 ENCOUNTER — Ambulatory Visit (INDEPENDENT_AMBULATORY_CARE_PROVIDER_SITE_OTHER): Payer: 59 | Admitting: Family Medicine

## 2017-04-10 ENCOUNTER — Encounter: Payer: Self-pay | Admitting: Pulmonary Disease

## 2017-04-10 VITALS — BP 128/59 | HR 74 | Temp 97.9°F | Ht 65.0 in | Wt 190.0 lb

## 2017-04-10 DIAGNOSIS — E559 Vitamin D deficiency, unspecified: Secondary | ICD-10-CM

## 2017-04-10 DIAGNOSIS — Z6831 Body mass index (BMI) 31.0-31.9, adult: Secondary | ICD-10-CM

## 2017-04-10 DIAGNOSIS — E669 Obesity, unspecified: Secondary | ICD-10-CM

## 2017-04-10 DIAGNOSIS — F3289 Other specified depressive episodes: Secondary | ICD-10-CM

## 2017-04-10 DIAGNOSIS — Z9189 Other specified personal risk factors, not elsewhere classified: Secondary | ICD-10-CM | POA: Diagnosis not present

## 2017-04-10 MED ORDER — VITAMIN D (ERGOCALCIFEROL) 1.25 MG (50000 UNIT) PO CAPS: 50000.0000 [IU] | ORAL_CAPSULE | ORAL | 0 refills | Status: DC

## 2017-04-10 MED ORDER — BUPROPION HCL ER (SR) 200 MG PO TB12: 200.0000 mg | ORAL_TABLET | Freq: Every day | ORAL | 0 refills | Status: DC

## 2017-04-10 NOTE — Progress Notes (Signed)
Office: [phone]  /  Fax: [phone]   HPI:   Chief Complaint: OBESITY Brenda Russo is here to discuss her progress with her obesity treatment plan. She is on the keep a food journal with 1300 calories and 75+ grams of protein daily and is following her eating plan approximately 70 % of the time. She states she is walking 20 minutes 2 times per week. Brenda Russo is struggling with getting in protein and staying within her calorie goal. She is snacking a lot on protein snacks at night to try to get to her protein goal. Her weight is 190 lb (86.2 kg) today and has had a weight gain of 1 pounds over a period of 2 weeks since her last visit. She has lost 10 lbs since starting treatment with [name].  Vitamin D deficiency Kendre has a diagnosis of vitamin D deficiency. Beauty is currently taking vit D and fatigue has improved. She denies nausea, vomiting or muscle weakness.   Ref. Range 12/06/2016 09:54  Vitamin D, 25-Hydroxy Latest Ref Range: 30.0 - 100.0 ng/mL 42.9   At risk for osteopenia and osteoporosis Maureen is at higher risk of osteopenia and osteoporosis due to vitamin D deficiency.   Depression with emotional eating behaviors Alvarados blood pressure is controlled and she is starting to get control of her craving. Yarithza struggles with emotional eating and using food for comfort to the extent that it is negatively impacting her health. She often snacks when she is not hungry. Velta sometimes feels she is out of control and then feels guilty that she made poor food choices. She has been working on behavior modification techniques to help reduce her emotional eating and has been somewhat successful. She shows no sign of suicidal or homicidal ideations.  Depression screen [facility] 2/9 05/17/2016 04/12/2016 12/22/2015 11/21/2015 11/18/2015  Decreased Interest 0 0 0 0 0  Down, Depressed, Hopeless 1 0 0 0 0  PHQ - 2 Score 1 0 0 0 0  Altered sleeping 2 - - - -  Tired, decreased energy 1 - -  - -  Change in appetite 2 - - - -  Feeling bad or failure about yourself  0 - - - -  Trouble concentrating 2 - - - -  Moving slowly or fidgety/restless 0 - - - -  Suicidal thoughts 0 - - - -  PHQ-9 Score 8 - - - -     ALLERGIES: Allergies  Allergen Reactions  . Eggs Or Egg-Derived Products     Shortness of breath, wheezing    MEDICATIONS: Current Outpatient Medications on File Prior to Visit  Medication Sig Dispense Refill  . albuterol (PROVENTIL) (2.5 MG/3ML) 0.083% nebulizer solution Take 3 mLs (2.5 mg total) by nebulization 3 (three) times daily as needed. 75 mL 6  . albuterol (VENTOLIN HFA) 108 (90 Base) MCG/ACT inhaler Inhale 1-2 puffs into the lungs every 4 (four) hours as needed for wheezing or shortness of breath. 54 g 1  . amLODipine (NORVASC) 5 MG tablet Take 0.5 tablets (2.5 mg total) by mouth daily. 15 tablet 8  . Ascorbic Acid (VITAMIN C) 1000 MG tablet Take 1,000 mg by mouth daily.    . Coenzyme Q10 (COQ10) 100 MG CAPS Take 1 capsule by mouth daily.    . fluticasone (FLONASE) 50 MCG/ACT nasal spray Place 2 sprays into both nostrils daily. 16 g 5  . fluticasone furoate-vilanterol (BREO ELLIPTA) 200-25 MCG/INH AEPB Inhale 1 puff into the lungs daily. 180 each 1  .  losartan-hydrochlorothiazide (HYZAAR) 100-12.5 MG tablet Take 0.5 tablets by mouth daily. 15 tablet 8  . magnesium oxide (MAG-OX) 400 MG tablet 2 tablets daily. 400-800 mg daily    . Multiple Minerals-Vitamins (CALCIUM CITRATE PLUS/MAGNESIUM) TABS Take 1 tablet by mouth.    . Multiple Vitamins-Minerals (COMPLETE WOMENS PO) Take 1 capsule by mouth 2 (two) times daily.      . Omega-3 Fatty Acids (FISH OIL) 1000 MG CAPS Take 1 capsule by mouth.    . polyethylene glycol powder (GLYCOLAX/MIRALAX) powder Take 17 g by mouth daily. 3350 g 0   No current facility-administered medications on file prior to visit.     PAST MEDICAL HISTORY: Past Medical History:  Diagnosis Date  . Allergic rhinitis   . Anemia   .  Anxiety   . Asthma   . HTN (hypertension)   . Hyperlipidemia    borderline  . Obese   . OSA (obstructive sleep apnea)    mild ,no CPAP  . Overweight(278.02)   . Plantar wart    history  . Prediabetes   . Shortness of breath     PAST SURGICAL HISTORY: Past Surgical History:  Procedure Laterality Date  . BREAST BIOPSY Right 09/02/2015  . CESAREAN SECTION    . TUBAL LIGATION      SOCIAL HISTORY: Social History   Tobacco Use  . Smoking status: Never Smoker  . Smokeless tobacco: Never Used  Substance Use Topics  . Alcohol use: No  . Drug use: No    FAMILY HISTORY: Family History  Problem Relation Age of Onset  . Heart disease Mother        pacemaker   . Hypertension Mother   . Hyperlipidemia Mother   . Obesity Mother   . Asthma Father   . Pancreatic cancer Brother   . Breast cancer Maternal Aunt   . Colon cancer Neg Hx     ROS: Review of Systems  Constitutional: Negative for malaise/fatigue and weight loss.  Gastrointestinal: Negative for nausea and vomiting.  Musculoskeletal:       Negative for muscle weakness  Psychiatric/Behavioral: Positive for depression. Negative for suicidal ideas.    PHYSICAL EXAM: Blood pressure (!) 128/59, pulse 74, temperature 97.9 F (36.6 C), temperature source Oral, height 5\' 5"  (1.651 m), weight 190 lb (86.2 kg), SpO2 97 %. Body mass index is 31.62 kg/m. Physical Exam  Constitutional: She is oriented to person, place, and time. She appears well-developed and well-nourished.  Cardiovascular: Normal rate.  Pulmonary/Chest: Effort normal.  Musculoskeletal: Normal range of motion.  Neurological: She is oriented to person, place, and time.  Skin: Skin is warm and dry.  Psychiatric: She has a normal mood and affect. Her behavior is normal.  Vitals reviewed.   RECENT LABS AND TESTS: BMET    Component Value Date/Time   NA 143 12/06/2016 0954   K 4.3 12/06/2016 0954   CL 104 12/06/2016 0954   CO2 26 12/06/2016 0954    GLUCOSE 103 (H) 12/06/2016 0954   GLUCOSE 100 (H) 06/03/2015 0747   BUN 23 12/06/2016 0954   CREATININE 0.85 12/06/2016 0954   CALCIUM 9.3 12/06/2016 0954   GFRNONAA 75 12/06/2016 0954   GFRAA 86 12/06/2016 0954   Lab Results  Component Value Date   HGBA1C 5.6 12/06/2016   HGBA1C 5.6 05/17/2016   HGBA1C 5.9 09/22/2015   HGBA1C 6.2 06/03/2015   HGBA1C 6.0 05/21/2014   Lab Results  Component Value Date   INSULIN 11.2 12/06/2016  INSULIN 9.8 05/17/2016   CBC    Component Value Date/Time   WBC 5.8 05/17/2016 0949   WBC 6.5 11/13/2012 1003   RBC 4.83 05/17/2016 0949   RBC 5.03 11/13/2012 1003   HGB 13.7 05/17/2016 0949   HCT 42.4 05/17/2016 0949   PLT 268.0 11/13/2012 1003   MCV 88 05/17/2016 0949   MCH 28.4 05/17/2016 0949   MCH 26.9 (A) 05/20/2012 1404   MCHC 32.3 05/17/2016 0949   MCHC 33.5 11/13/2012 1003   RDW 14.2 05/17/2016 0949   LYMPHSABS 1.7 05/17/2016 0949   MONOABS 0.4 11/13/2012 1003   EOSABS 0.2 05/17/2016 0949   BASOSABS 0.0 05/17/2016 0949   Iron/TIBC/Ferritin/ %Sat    Component Value Date/Time   IRON 88 05/20/2012 1422   TIBC 303 05/20/2012 1422   FERRITIN 80 05/20/2012 1422   IRONPCTSAT 29 05/20/2012 1422   Lipid Panel     Component Value Date/Time   CHOL 162 05/17/2016 0949   TRIG 50 05/17/2016 0949   HDL 57 05/17/2016 0949   CHOLHDL 3 06/03/2015 0747   VLDL 9.0 06/03/2015 0747   LDLCALC 95 05/17/2016 0949   Hepatic Function Panel     Component Value Date/Time   PROT 7.1 12/06/2016 0954   ALBUMIN 4.0 12/06/2016 0954   AST 21 12/06/2016 0954   ALT 22 12/06/2016 0954   ALKPHOS 97 12/06/2016 0954   BILITOT 0.4 12/06/2016 0954   BILIDIR 0.1 11/13/2012 1003      Component Value Date/Time   TSH 1.770 05/17/2016 0949   TSH 1.23 11/13/2012 1003   TSH 2.26 11/30/2011 1210    ASSESSMENT AND PLAN: Vitamin D deficiency - Plan: Vitamin D, Ergocalciferol, (DRISDOL) 50000 units CAPS capsule  Other depression - with emotional eating -  Plan: buPROPion (WELLBUTRIN SR) 200 MG 12 hr tablet  At risk for osteoporosis  Class 1 obesity with serious comorbidity and body mass index (BMI) of 31.0 to 31.9 in adult, unspecified obesity type  PLAN:  Vitamin D Deficiency Latorria was informed that low vitamin D levels contributes to fatigue and are associated with obesity, breast, and colon cancer. She agrees to continue to take prescription Vit D @50 ,000 IU every week #4 with no refills and will follow up for routine testing of vitamin D, at least 2-3 times per year. She was informed of the risk of over-replacement of vitamin D and agrees to not increase her dose unless she discusses this with [name] first. Viha agrees to follow up with our clinic in 2 weeks.  At risk for osteopenia and osteoporosis Darrah is at risk for osteopenia and osteoporosis due to her vitamin D deficiency. She was encouraged to take her vitamin D and follow her higher calcium diet and increase strengthening exercise to help strengthen her bones and decrease her risk of osteopenia and osteoporosis.  Depression with Emotional Eating Behaviors We discussed behavior modification techniques today to help Maddelyn deal with her emotional eating and depression. She has agreed to continue Bupropion 200 mg qd #30 with no refills and follow up as directed.  Obesity Gurtha is currently in the action stage of change. As such, her goal is to continue with weight loss efforts She has agreed to keep a food journal with 1300 calories and 75 grams of protein daily Rosebud has been instructed to work up to a goal of 150 minutes of combined cardio and strengthening exercise per week for weight loss and overall health benefits. We discussed the following Behavioral Modification Strategies  today: increase H2O intake, better snacking choices, keep a strict food journal, increasing lean protein intake and work on meal planning and easy cooking plans  Aleea has agreed to follow  up with our clinic in 2 weeks. She was informed of the importance of frequent follow up visits to maximize her success with intensive lifestyle modifications for her multiple health conditions.   OBESITY BEHAVIORAL INTERVENTION VISIT  Today's visit was # 20 out of 22.  Starting weight: 200 lbs Starting date: 05/17/16 Today's weight : 190 lbs  Today's date: 04/10/2017 Total lbs lost to date: 10 (Patients must lose 7 lbs in the first 6 months to continue with counseling)   ASK: We discussed the diagnosis of obesity with [name] today and Alyra agreed to give [name] permission to discuss obesity behavioral modification therapy today.  ASSESS: Abcde has the diagnosis of obesity and her BMI today is 31.62 Caelen is in the action stage of change   ADVISE: Markala was educated on the multiple health risks of obesity as well as the benefit of weight loss to improve her health. She was advised of the need for long term treatment and the importance of lifestyle modifications.  AGREE: Multiple dietary modification options and treatment options were discussed and  Trinitie agreed to the above obesity treatment plan.  I, Doreene Nest, am acting as transcriptionist for Eber Jones, MD I have reviewed the above documentation for accuracy and completeness, and I agree with the above. - Ilene Qua, MD

## 2017-04-13 ENCOUNTER — Other Ambulatory Visit (INDEPENDENT_AMBULATORY_CARE_PROVIDER_SITE_OTHER): Payer: Self-pay | Admitting: Family Medicine

## 2017-04-13 ENCOUNTER — Encounter: Payer: Self-pay | Admitting: Internal Medicine

## 2017-04-13 MED FILL — BREO ELLIPTA 200-25 MCG INH: 200-25 | 30 days supply | Qty: 60 | Fill #0

## 2017-04-13 MED FILL — VIT D2 1.25 MG (50,000 UNIT: 1.25 MG | 28 days supply | Qty: 4 | Fill #0

## 2017-04-13 MED FILL — BUPROPION HCL SR 200 MG TAB: 200 | 30 days supply | Qty: 30 | Fill #0

## 2017-04-13 MED FILL — LOSARTAN-HCTZ 100-12.5 MG T: 100-12.5 | 90 days supply | Qty: 45 | Fill #2

## 2017-04-13 MED FILL — AMLODIPINE BESYLATE 5 MG TA: 5 | 90 days supply | Qty: 45 | Fill #2

## 2017-04-18 ENCOUNTER — Other Ambulatory Visit (INDEPENDENT_AMBULATORY_CARE_PROVIDER_SITE_OTHER): Payer: Self-pay | Admitting: Family Medicine

## 2017-04-24 ENCOUNTER — Ambulatory Visit (INDEPENDENT_AMBULATORY_CARE_PROVIDER_SITE_OTHER): Payer: 59 | Admitting: Physician Assistant

## 2017-04-24 VITALS — BP 144/80 | HR 56 | Temp 97.5°F | Ht 65.0 in | Wt 190.0 lb

## 2017-04-24 DIAGNOSIS — E669 Obesity, unspecified: Secondary | ICD-10-CM | POA: Diagnosis not present

## 2017-04-24 DIAGNOSIS — Z6831 Body mass index (BMI) 31.0-31.9, adult: Secondary | ICD-10-CM

## 2017-04-24 DIAGNOSIS — I10 Essential (primary) hypertension: Secondary | ICD-10-CM | POA: Diagnosis not present

## 2017-04-24 NOTE — Progress Notes (Signed)
Office: [phone]  /  Fax: [phone]   HPI:   Chief Complaint: OBESITY Brenda Russo is here to discuss her progress with her obesity treatment plan. She is on the keep a food journal with 1300 calories and 75 grams of protein daily and is following her eating plan approximately 70 % of the time. She states she is doing exercise tape for 60 minutes 2 times per week. Brenda Russo maintained her weight. She states she is using protein shakes as a meal replacement for breakfast and has noticed that makes it harder to eat the required protein on the meal plan. Her weight is 190 lb (86.2 kg) today and has not lost weight since her last visit. She has lost 10 lbs since starting treatment with [name].  Hypertension Brenda Russo is a 62 y.o. female with hypertension. Brenda Russo's blood pressure is elevated. She states she had just taken her Wellbutrin which could contribute to her elevated blood pressure. She refuses any adjustment to her blood pressure medications. She denies chest pain or shortness of breath. She is working weight loss to help control her blood pressure with the goal of decreasing her risk of heart attack and stroke. Brenda Russo's blood pressure is not currently controlled.  ALLERGIES: Allergies  Allergen Reactions  . Eggs Or Egg-Derived Products     Shortness of breath, wheezing    MEDICATIONS: Current Outpatient Medications on File Prior to Visit  Medication Sig Dispense Refill  . albuterol (PROVENTIL) (2.5 MG/3ML) 0.083% nebulizer solution Take 3 mLs (2.5 mg total) by nebulization 3 (three) times daily as needed. 75 mL 6  . albuterol (VENTOLIN HFA) 108 (90 Base) MCG/ACT inhaler Inhale 1-2 puffs into the lungs every 4 (four) hours as needed for wheezing or shortness of breath. 54 g 1  . amLODipine (NORVASC) 5 MG tablet Take 0.5 tablets (2.5 mg total) by mouth daily. 15 tablet 8  . Ascorbic Acid (VITAMIN C) 1000 MG tablet Take 1,000 mg by mouth daily.    Marland Kitchen buPROPion (WELLBUTRIN  SR) 200 MG 12 hr tablet Take 1 tablet (200 mg total) by mouth daily. 30 tablet 0  . Coenzyme Q10 (COQ10) 100 MG CAPS Take 1 capsule by mouth daily.    . fluticasone (FLONASE) 50 MCG/ACT nasal spray Place 2 sprays into both nostrils daily. 16 g 5  . fluticasone furoate-vilanterol (BREO ELLIPTA) 200-25 MCG/INH AEPB Inhale 1 puff into the lungs daily. 180 each 1  . losartan-hydrochlorothiazide (HYZAAR) 100-12.5 MG tablet Take 0.5 tablets by mouth daily. 15 tablet 8  . magnesium oxide (MAG-OX) 400 MG tablet 2 tablets daily. 400-800 mg daily    . Multiple Minerals-Vitamins (CALCIUM CITRATE PLUS/MAGNESIUM) TABS Take 1 tablet by mouth.    . Multiple Vitamins-Minerals (COMPLETE WOMENS PO) Take 1 capsule by mouth 2 (two) times daily.      . Omega-3 Fatty Acids (FISH OIL) 1000 MG CAPS Take 1 capsule by mouth.    . polyethylene glycol powder (GLYCOLAX/MIRALAX) powder Take 17 g by mouth daily. 3350 g 0  . Vitamin D, Ergocalciferol, (DRISDOL) 50000 units CAPS capsule Take 1 capsule (50,000 Units total) by mouth every 7 (seven) days. 4 capsule 0   No current facility-administered medications on file prior to visit.     PAST MEDICAL HISTORY: Past Medical History:  Diagnosis Date  . Allergic rhinitis   . Anemia   . Anxiety   . Asthma   . HTN (hypertension)   . Hyperlipidemia    borderline  . Obese   .  OSA (obstructive sleep apnea)    mild ,no CPAP  . Overweight(278.02)   . Plantar wart    history  . Prediabetes   . Shortness of breath     PAST SURGICAL HISTORY: Past Surgical History:  Procedure Laterality Date  . BREAST BIOPSY Right 09/02/2015  . CESAREAN SECTION    . TUBAL LIGATION      SOCIAL HISTORY: Social History   Tobacco Use  . Smoking status: Never Smoker  . Smokeless tobacco: Never Used  Substance Use Topics  . Alcohol use: No  . Drug use: No    FAMILY HISTORY: Family History  Problem Relation Age of Onset  . Heart disease Mother        pacemaker   . Hypertension  Mother   . Hyperlipidemia Mother   . Obesity Mother   . Asthma Father   . Pancreatic cancer Brother   . Breast cancer Maternal Aunt   . Colon cancer Neg Hx     ROS: Review of Systems  Constitutional: Negative for weight loss.  Respiratory: Negative for shortness of breath.   Cardiovascular: Negative for chest pain.    PHYSICAL EXAM: Blood pressure (!) 144/80, pulse (!) 56, temperature (!) 97.5 F (36.4 C), temperature source Oral, height 5\' 5"  (1.651 m), weight 190 lb (86.2 kg), SpO2 97 %. Body mass index is 31.62 kg/m. Physical Exam  Constitutional: She is oriented to person, place, and time. She appears well-developed and well-nourished.  Cardiovascular: Bradycardia present.  Pulmonary/Chest: Effort normal.  Musculoskeletal: Normal range of motion.  Neurological: She is oriented to person, place, and time.  Skin: Skin is warm and dry.  Psychiatric: She has a normal mood and affect. Her behavior is normal.  Vitals reviewed.   RECENT LABS AND TESTS: BMET    Component Value Date/Time   NA 143 12/06/2016 0954   K 4.3 12/06/2016 0954   CL 104 12/06/2016 0954   CO2 26 12/06/2016 0954   GLUCOSE 103 (H) 12/06/2016 0954   GLUCOSE 100 (H) 06/03/2015 0747   BUN 23 12/06/2016 0954   CREATININE 0.85 12/06/2016 0954   CALCIUM 9.3 12/06/2016 0954   GFRNONAA 75 12/06/2016 0954   GFRAA 86 12/06/2016 0954   Lab Results  Component Value Date   HGBA1C 5.6 12/06/2016   HGBA1C 5.6 05/17/2016   HGBA1C 5.9 09/22/2015   HGBA1C 6.2 06/03/2015   HGBA1C 6.0 05/21/2014   Lab Results  Component Value Date   INSULIN 11.2 12/06/2016   INSULIN 9.8 05/17/2016   CBC    Component Value Date/Time   WBC 5.8 05/17/2016 0949   WBC 6.5 11/13/2012 1003   RBC 4.83 05/17/2016 0949   RBC 5.03 11/13/2012 1003   HGB 13.7 05/17/2016 0949   HCT 42.4 05/17/2016 0949   PLT 268.0 11/13/2012 1003   MCV 88 05/17/2016 0949   MCH 28.4 05/17/2016 0949   MCH 26.9 (A) 05/20/2012 1404   MCHC 32.3  05/17/2016 0949   MCHC 33.5 11/13/2012 1003   RDW 14.2 05/17/2016 0949   LYMPHSABS 1.7 05/17/2016 0949   MONOABS 0.4 11/13/2012 1003   EOSABS 0.2 05/17/2016 0949   BASOSABS 0.0 05/17/2016 0949   Iron/TIBC/Ferritin/ %Sat    Component Value Date/Time   IRON 88 05/20/2012 1422   TIBC 303 05/20/2012 1422   FERRITIN 80 05/20/2012 1422   IRONPCTSAT 29 05/20/2012 1422   Lipid Panel     Component Value Date/Time   CHOL 162 05/17/2016 0949   TRIG 50 05/17/2016  0949   HDL 57 05/17/2016 0949   CHOLHDL 3 06/03/2015 0747   VLDL 9.0 06/03/2015 0747   LDLCALC 95 05/17/2016 0949   Hepatic Function Panel     Component Value Date/Time   PROT 7.1 12/06/2016 0954   ALBUMIN 4.0 12/06/2016 0954   AST 21 12/06/2016 0954   ALT 22 12/06/2016 0954   ALKPHOS 97 12/06/2016 0954   BILITOT 0.4 12/06/2016 0954   BILIDIR 0.1 11/13/2012 1003      Component Value Date/Time   TSH 1.770 05/17/2016 0949   TSH 1.23 11/13/2012 1003   TSH 2.26 11/30/2011 1210    ASSESSMENT AND PLAN: Essential hypertension  Class 1 obesity with serious comorbidity and body mass index (BMI) of 31.0 to 31.9 in adult, unspecified obesity type  PLAN:  Hypertension We discussed sodium restriction, working on healthy weight loss, and a regular exercise program as the means to achieve improved blood pressure control. Brenda Russo agreed with this plan and agreed to follow up as directed. We will continue to monitor her blood pressure as well as her progress with the above lifestyle modifications. She will continue her medications as prescribed and will watch for signs of hypotension as she continues her lifestyle modifications. Brenda Russo agrees to follow up with our clinic in 2 weeks.  We spent > than 50% of the 15 minute visit on the counseling as documented in the note.  Obesity Brenda Russo is currently in the action stage of change. As such, her goal is to continue with weight loss efforts She has agreed to follow the Category  3 plan Brenda Russo has been instructed to work up to a goal of 150 minutes of combined cardio and strengthening exercise per week for weight loss and overall health benefits. We discussed the following Behavioral Modification Strategies today: increasing lean protein intake and work on meal planning and easy cooking plans   Brenda Russo has agreed to follow up with our clinic in 2 weeks. She was informed of the importance of frequent follow up visits to maximize her success with intensive lifestyle modifications for her multiple health conditions.   OBESITY BEHAVIORAL INTERVENTION VISIT  Today's visit was # 21 out of 22.  Starting weight: 200 lbs Starting date: 05/17/16 Today's weight : 190 lbs Today's date: 04/24/2017 Total lbs lost to date: 10 (Patients must lose 7 lbs in the first 6 months to continue with counseling)   ASK: We discussed the diagnosis of obesity with Brenda Russo today and Brenda Russo agreed to give [name] permission to discuss obesity behavioral modification therapy today.  ASSESS: Brenda Russo has the diagnosis of obesity and her BMI today is 31.62 Brenda Russo is in the action stage of change   ADVISE: Brenda Russo was educated on the multiple health risks of obesity as well as the benefit of weight loss to improve her health. She was advised of the need for long term treatment and the importance of lifestyle modifications.  AGREE: Multiple dietary modification options and treatment options were discussed and  Brenda Russo agreed to the above obesity treatment plan.   Brenda Russo, am acting as transcriptionist for Lacy Duverney, PA-C I, Lacy Duverney [facility], have reviewed this note and agree with its content

## 2017-05-06 ENCOUNTER — Encounter (INDEPENDENT_AMBULATORY_CARE_PROVIDER_SITE_OTHER): Payer: Self-pay | Admitting: Family Medicine

## 2017-05-09 ENCOUNTER — Ambulatory Visit (INDEPENDENT_AMBULATORY_CARE_PROVIDER_SITE_OTHER): Payer: 59 | Admitting: Family Medicine

## 2017-05-09 VITALS — BP 136/79 | HR 58 | Temp 98.0°F | Ht 65.0 in | Wt 188.0 lb

## 2017-05-09 DIAGNOSIS — E669 Obesity, unspecified: Secondary | ICD-10-CM

## 2017-05-09 DIAGNOSIS — E559 Vitamin D deficiency, unspecified: Secondary | ICD-10-CM

## 2017-05-09 DIAGNOSIS — Z6831 Body mass index (BMI) 31.0-31.9, adult: Secondary | ICD-10-CM | POA: Diagnosis not present

## 2017-05-09 DIAGNOSIS — Z9189 Other specified personal risk factors, not elsewhere classified: Secondary | ICD-10-CM

## 2017-05-09 DIAGNOSIS — F3289 Other specified depressive episodes: Secondary | ICD-10-CM | POA: Diagnosis not present

## 2017-05-09 DIAGNOSIS — I10 Essential (primary) hypertension: Secondary | ICD-10-CM | POA: Diagnosis not present

## 2017-05-09 MED ORDER — VITAMIN D (ERGOCALCIFEROL) 1.25 MG (50000 UNIT) PO CAPS: 50000.0000 [IU] | ORAL_CAPSULE | ORAL | 0 refills | Status: DC

## 2017-05-09 MED ORDER — BUPROPION HCL ER (SR) 100 MG PO TB12: 100.0000 mg | ORAL_TABLET | Freq: Two times a day (BID) | ORAL | 0 refills | Status: DC

## 2017-05-09 MED FILL — BUPROPION HCL SR 100 MG TAB: 100 | 30 days supply | Qty: 60 | Fill #0

## 2017-05-09 MED FILL — VIT D2 1.25 MG (50,000 UNIT: 1.25 MG | 28 days supply | Qty: 4 | Fill #0

## 2017-05-09 NOTE — Progress Notes (Signed)
Office: [phone]  /  Fax: [phone]   HPI:   Chief Complaint: OBESITY Brenda Russo is here to discuss her progress with her obesity treatment plan. She is on the Category 3 plan/Journaling and is following her eating plan approximately 50-60 % of the time. She states she is walking and doing water aerobics for 30 minutes 5 times per week. [name]'s journaling is going well, definitely getting protein in. Was limiting herself to 1100-1300 calories (IC showing RMR of 1551). She started water aerobics with [facility].  Her weight is 188 lb (85.3 kg) today and has had a weight loss of 2 pounds over a period of 2 weeks since her last visit. She has lost 12 lbs since starting treatment with [name].  Vitamin D Deficiency Vertie has a diagnosis of vitamin D deficiency. She is on prescription Vit D and denies nausea, vomiting or muscle weakness.  Hypertension Brenda Russo is a 62 y.o. female with hypertension. [name]'s blood pressure is slightly elevated. She is on amlodipine and losartan-hydrochlorothiazide. She denies chest pain, chest pressure, or headache. She is working weight loss to help control her blood pressure with the goal of decreasing her risk of heart attack and stroke. Myrene's blood pressure is not currently controlled.  At risk for cardiovascular disease Brenda Russo is at a higher than average risk for cardiovascular disease due to obesity and hypertension. She currently denies any chest pain.  Depression with emotional eating behaviors Brenda Russo's blood pressure is slightly elevated. She reports not taking it everyday, secondary to concern of elevated blood pressure. Brenda Russo struggles with emotional eating and using food for comfort to the extent that it is negatively impacting her health. She often snacks when she is not hungry. [name] sometimes feels she is out of control and then feels guilty that she made poor food choices. She has been working on behavior modification  techniques to help reduce her emotional eating and has been somewhat successful. She shows no sign of suicidal or homicidal ideations.  Depression screen [facility] 2/9 05/17/2016 04/12/2016 12/22/2015 11/21/2015 11/18/2015  Decreased Interest 0 0 0 0 0  Down, Depressed, Hopeless 1 0 0 0 0  PHQ - 2 Score 1 0 0 0 0  Altered sleeping 2 - - - -  Tired, decreased energy 1 - - - -  Change in appetite 2 - - - -  Feeling bad or failure about yourself  0 - - - -  Trouble concentrating 2 - - - -  Moving slowly or fidgety/restless 0 - - - -  Suicidal thoughts 0 - - - -  PHQ-9 Score 8 - - - -    ALLERGIES: Allergies  Allergen Reactions  . Eggs Or Egg-Derived Products     Shortness of breath, wheezing    MEDICATIONS: Current Outpatient Medications on File Prior to Visit  Medication Sig Dispense Refill  . albuterol (VENTOLIN HFA) 108 (90 Base) MCG/ACT inhaler Inhale 1-2 puffs into the lungs every 4 (four) hours as needed for wheezing or shortness of breath. 54 g 1  . amLODipine (NORVASC) 5 MG tablet Take 0.5 tablets (2.5 mg total) by mouth daily. 15 tablet 8  . Ascorbic Acid (VITAMIN C) 1000 MG tablet Take 1,000 mg by mouth daily.    . Coenzyme Q10 (COQ10) 100 MG CAPS Take 1 capsule by mouth daily.    . fluticasone (FLONASE) 50 MCG/ACT nasal spray Place 2 sprays into both nostrils daily. 16 g 5  . fluticasone furoate-vilanterol (BREO ELLIPTA) 200-25 MCG/INH  AEPB Inhale 1 puff into the lungs daily. 180 each 1  . losartan-hydrochlorothiazide (HYZAAR) 100-12.5 MG tablet Take 0.5 tablets by mouth daily. 15 tablet 8  . magnesium oxide (MAG-OX) 400 MG tablet 2 tablets daily. 400-800 mg daily    . Multiple Minerals-Vitamins (CALCIUM CITRATE PLUS/MAGNESIUM) TABS Take 1 tablet by mouth.    . Multiple Vitamins-Minerals (COMPLETE WOMENS PO) Take 1 capsule by mouth 2 (two) times daily.      . Omega-3 Fatty Acids (FISH OIL) 1000 MG CAPS Take 1 capsule by mouth.    . Vitamin D, Ergocalciferol, (DRISDOL) 50000 units  CAPS capsule Take 1 capsule (50,000 Units total) by mouth every 7 (seven) days. 4 capsule 0   No current facility-administered medications on file prior to visit.     PAST MEDICAL HISTORY: Past Medical History:  Diagnosis Date  . Allergic rhinitis   . Anemia   . Anxiety   . Asthma   . HTN (hypertension)   . Hyperlipidemia    borderline  . Obese   . OSA (obstructive sleep apnea)    mild ,no CPAP  . Overweight(278.02)   . Plantar wart    history  . Prediabetes   . Shortness of breath     PAST SURGICAL HISTORY: Past Surgical History:  Procedure Laterality Date  . BREAST BIOPSY Right 09/02/2015  . CESAREAN SECTION    . TUBAL LIGATION      SOCIAL HISTORY: Social History   Tobacco Use  . Smoking status: Never Smoker  . Smokeless tobacco: Never Used  Substance Use Topics  . Alcohol use: No  . Drug use: No    FAMILY HISTORY: Family History  Problem Relation Age of Onset  . Heart disease Mother        pacemaker   . Hypertension Mother   . Hyperlipidemia Mother   . Obesity Mother   . Asthma Father   . Pancreatic cancer Brother   . Breast cancer Maternal Aunt   . Colon cancer Neg Hx     ROS: Review of Systems  Constitutional: Positive for weight loss.  Cardiovascular: Negative for chest pain.       Negative chest pressure  Gastrointestinal: Negative for nausea and vomiting.  Musculoskeletal:       Negative muscle weakness  Neurological: Negative for headaches.  Psychiatric/Behavioral: Positive for depression. Negative for suicidal ideas.    PHYSICAL EXAM: Blood pressure 136/79, pulse (!) 58, temperature 98 F (36.7 C), temperature source Oral, height 5\' 5"  (1.651 m), weight 188 lb (85.3 kg), SpO2 97 %. Body mass index is 31.28 kg/m. Physical Exam  Constitutional: She is oriented to person, place, and time. She appears well-developed and well-nourished.  Cardiovascular: Normal rate.  Pulmonary/Chest: Effort normal.  Musculoskeletal: Normal range of  motion.  Neurological: She is oriented to person, place, and time.  Skin: Skin is warm and dry.  Psychiatric: She has a normal mood and affect. Her behavior is normal.  Vitals reviewed.   RECENT LABS AND TESTS: BMET    Component Value Date/Time   NA 143 12/06/2016 0954   K 4.3 12/06/2016 0954   CL 104 12/06/2016 0954   CO2 26 12/06/2016 0954   GLUCOSE 103 (H) 12/06/2016 0954   GLUCOSE 100 (H) 06/03/2015 0747   BUN 23 12/06/2016 0954   CREATININE 0.85 12/06/2016 0954   CALCIUM 9.3 12/06/2016 0954   GFRNONAA 75 12/06/2016 0954   GFRAA 86 12/06/2016 0954   Lab Results  Component Value Date  HGBA1C 5.6 12/06/2016   HGBA1C 5.6 05/17/2016   HGBA1C 5.9 09/22/2015   HGBA1C 6.2 06/03/2015   HGBA1C 6.0 05/21/2014   Lab Results  Component Value Date   INSULIN 11.2 12/06/2016   INSULIN 9.8 05/17/2016   CBC    Component Value Date/Time   WBC 5.8 05/17/2016 0949   WBC 6.5 11/13/2012 1003   RBC 4.83 05/17/2016 0949   RBC 5.03 11/13/2012 1003   HGB 13.7 05/17/2016 0949   HCT 42.4 05/17/2016 0949   PLT 268.0 11/13/2012 1003   MCV 88 05/17/2016 0949   MCH 28.4 05/17/2016 0949   MCH 26.9 (A) 05/20/2012 1404   MCHC 32.3 05/17/2016 0949   MCHC 33.5 11/13/2012 1003   RDW 14.2 05/17/2016 0949   LYMPHSABS 1.7 05/17/2016 0949   MONOABS 0.4 11/13/2012 1003   EOSABS 0.2 05/17/2016 0949   BASOSABS 0.0 05/17/2016 0949   Iron/TIBC/Ferritin/ %Sat    Component Value Date/Time   IRON 88 05/20/2012 1422   TIBC 303 05/20/2012 1422   FERRITIN 80 05/20/2012 1422   IRONPCTSAT 29 05/20/2012 1422   Lipid Panel     Component Value Date/Time   CHOL 162 05/17/2016 0949   TRIG 50 05/17/2016 0949   HDL 57 05/17/2016 0949   CHOLHDL 3 06/03/2015 0747   VLDL 9.0 06/03/2015 0747   LDLCALC 95 05/17/2016 0949   Hepatic Function Panel     Component Value Date/Time   PROT 7.1 12/06/2016 0954   ALBUMIN 4.0 12/06/2016 0954   AST 21 12/06/2016 0954   ALT 22 12/06/2016 0954   ALKPHOS 97  12/06/2016 0954   BILITOT 0.4 12/06/2016 0954   BILIDIR 0.1 11/13/2012 1003      Component Value Date/Time   TSH 1.770 05/17/2016 0949   TSH 1.23 11/13/2012 1003   TSH 2.26 11/30/2011 1210  Results for KHIANNA, BLAZINA (MRN [id number]) as of 05/09/2017 09:29  Ref. Range 12/06/2016 09:54  Vitamin D, 25-Hydroxy Latest Ref Range: 30.0 - 100.0 ng/mL 42.9    ASSESSMENT AND PLAN: Vitamin D deficiency - Plan: VITAMIN D 25 Hydroxy (Vit-D Deficiency, Fractures), Vitamin D, Ergocalciferol, (DRISDOL) 50000 units CAPS capsule  Essential hypertension - Plan: Comprehensive metabolic panel, CBC with Differential/Platelet, Hemoglobin A1c, Insulin, random, Lipid panel, Vitamin B12, Folate, T3, T4, free, TSH  Other depression - with emotional eating - Plan: buPROPion (WELLBUTRIN SR) 100 MG 12 hr tablet  At risk for heart disease  Class 1 obesity with serious comorbidity and body mass index (BMI) of 31.0 to 31.9 in adult, unspecified obesity type  PLAN:  Vitamin D Deficiency Yasamin was informed that low vitamin D levels contributes to fatigue and are associated with obesity, breast, and colon cancer. Addelynn agrees to continue taking prescription Vit D @50 ,000 IU every week #4 with no refills. She will follow up for routine testing of vitamin D, at least 2-3 times per year. She was informed of the risk of over-replacement of vitamin D and agrees to not increase her dose unless she discusses this with [name] first. We will check labs today and Carlei agrees to follow up with our clinic in 2 weeks.  Hypertension We discussed sodium restriction, working on healthy weight loss, and a regular exercise program as the means to achieve improved blood pressure control. Dawson agreed with this plan and agreed to follow up as directed. We will continue to monitor her blood pressure as well as her progress with the above lifestyle modifications. Melba agrees to continue taking amlodipine and  losartan-hydrochlorothiazide as prescribed and will watch for signs of hypotension as she continues her lifestyle modifications. Dakiyah agrees to follow up with our clinic in 2 weeks.  Cardiovascular risk counselling Myrtha was given extended (15 minutes) coronary artery disease prevention counseling today. She is 62 y.o. female and has risk factors for heart disease including obesity and hypertension. We discussed intensive lifestyle modifications today with an emphasis on specific weight loss instructions and strategies. Pt was also informed of the importance of increasing exercise and decreasing saturated fats to help prevent heart disease.  Depression with Emotional Eating Behaviors We discussed behavior modification techniques today to help Ivonna deal with her emotional eating and depression. Latrell agrees to stop Wellbutrin SR 200 mg PO daily and start Wellbutrin SR 100 mg PO BID #60 with no refills. Niaomi agrees to follow up with our clinic in 2 weeks.  Obesity Roniqua is currently in the action stage of change. As such, her goal is to continue with weight loss efforts She has agreed to keep a food journal with 1300-1400 calories and 80 grams of protein daily Kiele has been instructed to work up to a goal of 150 minutes of combined cardio and strengthening exercise per week for weight loss and overall health benefits. We discussed the following Behavioral Modification Strategies today: increasing lean protein intake, work on meal planning and easy cooking plans, increase H20 intake, better snacking choices, planning for success, and keep a strict food journal   Laquanna has agreed to follow up with our clinic in 2 weeks. She was informed of the importance of frequent follow up visits to maximize her success with intensive lifestyle modifications for her multiple health conditions.   OBESITY BEHAVIORAL INTERVENTION VISIT  Today's visit was # 22 out of 22.  Starting  weight: 200 lbs Starting date: 05/17/16 Today's weight : 188 lbs Today's date: 05/09/2017 Total lbs lost to date: 12 (Patients must lose 7 lbs in the first 6 months to continue with counseling)   ASK: We discussed the diagnosis of obesity with [name] today and Jinnifer agreed to give [name] permission to discuss obesity behavioral modification therapy today.  ASSESS: Bristol has the diagnosis of obesity and her BMI today is 31.28 Tanette is in the action stage of change   ADVISE: Candas was educated on the multiple health risks of obesity as well as the benefit of weight loss to improve her health. She was advised of the need for long term treatment and the importance of lifestyle modifications.  AGREE: Multiple dietary modification options and treatment options were discussed and  Swayzie agreed to the above obesity treatment plan.  I, Trixie Dredge, am acting as transcriptionist for Ilene Qua, MD  I have reviewed the above documentation for accuracy and completeness, and I agree with the above. - Ilene Qua, MD

## 2017-05-10 LAB — VITAMIN D 25 HYDROXY (VIT D DEFICIENCY, FRACTURES): Vit D, 25-Hydroxy: 47.2 ng/mL (ref 30.0–100.0)

## 2017-05-10 LAB — VITAMIN B12: VITAMIN B 12: 1939 pg/mL — AB (ref 232–1245)

## 2017-05-10 LAB — T3: T3, Total: 94 ng/dL (ref 71–180)

## 2017-05-10 LAB — COMPREHENSIVE METABOLIC PANEL
ALT: 22 IU/L (ref 0–32)
AST: 22 IU/L (ref 0–40)
Albumin/Globulin Ratio: 1.4 (ref 1.2–2.2)
Albumin: 4 g/dL (ref 3.6–4.8)
Alkaline Phosphatase: 94 IU/L (ref 39–117)
BUN/Creatinine Ratio: 23 (ref 12–28)
BUN: 19 mg/dL (ref 8–27)
Bilirubin Total: 0.5 mg/dL (ref 0.0–1.2)
CALCIUM: 9.4 mg/dL (ref 8.7–10.3)
CO2: 26 mmol/L (ref 20–29)
CREATININE: 0.82 mg/dL (ref 0.57–1.00)
Chloride: 101 mmol/L (ref 96–106)
GFR calc Af Amer: 90 mL/min/{1.73_m2} (ref >59)
GFR calc non Af Amer: 78 mL/min/{1.73_m2} (ref >59)
Globulin, Total: 2.9 g/dL (ref 1.5–4.5)
Glucose: 96 mg/dL (ref 65–99)
Potassium: 3.8 mmol/L (ref 3.5–5.2)
Sodium: 144 mmol/L (ref 134–144)
Total Protein: 6.9 g/dL (ref 6.0–8.5)

## 2017-05-10 LAB — CBC WITH DIFFERENTIAL/PLATELET
BASOS: 1 %
Basophils Absolute: 0 10*3/uL (ref 0.0–0.2)
EOS (ABSOLUTE): 0.1 10*3/uL (ref 0.0–0.4)
EOS: 2 %
HEMATOCRIT: 42.8 % (ref 34.0–46.6)
HEMOGLOBIN: 14 g/dL (ref 11.1–15.9)
IMMATURE GRANS (ABS): 0 10*3/uL (ref 0.0–0.1)
IMMATURE GRANULOCYTES: 0 %
LYMPHS: 28 %
Lymphocytes Absolute: 1.8 10*3/uL (ref 0.7–3.1)
MCH: 29.6 pg (ref 26.6–33.0)
MCHC: 32.7 g/dL (ref 31.5–35.7)
MCV: 91 fL (ref 79–97)
MONOCYTES: 5 %
Monocytes Absolute: 0.3 10*3/uL (ref 0.1–0.9)
NEUTROS PCT: 64 %
Neutrophils Absolute: 4 10*3/uL (ref 1.4–7.0)
Platelets: 315 10*3/uL (ref 150–379)
RBC: 4.73 x10E6/uL (ref 3.77–5.28)
RDW: 13.3 % (ref 12.3–15.4)
WBC: 6.3 10*3/uL (ref 3.4–10.8)

## 2017-05-10 LAB — LIPID PANEL
CHOLESTEROL TOTAL: 181 mg/dL (ref 100–199)
Chol/HDL Ratio: 2.7 ratio (ref 0.0–4.4)
HDL: 67 mg/dL (ref >39)
LDL Calculated: 106 mg/dL — ABNORMAL HIGH (ref 0–99)
Triglycerides: 39 mg/dL (ref 0–149)
VLDL CHOLESTEROL CAL: 8 mg/dL (ref 5–40)

## 2017-05-10 LAB — HEMOGLOBIN A1C
Est. average glucose Bld gHb Est-mCnc: 114 mg/dL
Hgb A1c MFr Bld: 5.6 % (ref 4.8–5.6)

## 2017-05-10 LAB — T4, FREE: FREE T4: 1.3 ng/dL (ref 0.82–1.77)

## 2017-05-10 LAB — FOLATE

## 2017-05-10 LAB — INSULIN, RANDOM: INSULIN: 6.1 u[IU]/mL (ref 2.6–24.9)

## 2017-05-10 LAB — TSH: TSH: 2.46 u[IU]/mL (ref 0.450–4.500)

## 2017-05-18 ENCOUNTER — Encounter: Payer: Self-pay | Admitting: Internal Medicine

## 2017-05-22 ENCOUNTER — Encounter (INDEPENDENT_AMBULATORY_CARE_PROVIDER_SITE_OTHER): Payer: Self-pay

## 2017-05-22 ENCOUNTER — Ambulatory Visit (INDEPENDENT_AMBULATORY_CARE_PROVIDER_SITE_OTHER): Payer: 59 | Admitting: Family Medicine

## 2017-05-22 VITALS — BP 121/77 | HR 61 | Temp 98.1°F | Ht 65.0 in | Wt 189.0 lb

## 2017-05-22 DIAGNOSIS — Z6831 Body mass index (BMI) 31.0-31.9, adult: Secondary | ICD-10-CM

## 2017-05-22 DIAGNOSIS — I10 Essential (primary) hypertension: Secondary | ICD-10-CM | POA: Diagnosis not present

## 2017-05-22 DIAGNOSIS — E669 Obesity, unspecified: Secondary | ICD-10-CM | POA: Diagnosis not present

## 2017-05-22 DIAGNOSIS — E559 Vitamin D deficiency, unspecified: Secondary | ICD-10-CM

## 2017-05-22 DIAGNOSIS — F3289 Other specified depressive episodes: Secondary | ICD-10-CM | POA: Diagnosis not present

## 2017-05-22 MED ORDER — ERGOCALCIFEROL 62.5 MCG (2500 UT) PO CAPS: 5000.0000 [IU] | ORAL_CAPSULE | Freq: Every day | ORAL | 0 refills | Status: AC

## 2017-05-23 NOTE — Progress Notes (Signed)
Office: [phone]  /  Fax: [phone]   HPI:   Chief Complaint: OBESITY Brenda Russo is here to discuss her progress with her obesity treatment plan. She is on the keep a food journal with 1300 to 1400 calories and 80 grams of protein daily and is following her eating plan approximately 70 % of the time. She states she is walking and swimming 45 minutes 3 times per week. [name] feels she has been mindful in terms of portion and protein in her diet. She is still enjoying water aerobics. She has been trying a new brand of food "Fit Crunch". Her weight is 189 lb (85.7 kg) today and has had a weight loss of 1 pound over a period of 2 weeks since her last visit. She has lost 11 lbs since starting treatment with [name].  Vitamin D deficiency Brenda Russo has a diagnosis of vitamin D deficiency. She is currently taking vit D and denies nausea, vomiting or muscle weakness.  Hypertension Brenda Russo is a 62 y.o. female with hypertension. [name] denies chest pain or shortness of breath on exertion. She is working weight loss to help control her blood pressure with the goal of decreasing her risk of heart attack and stroke. CMP is normal. Alvarados blood pressure is controlled today.  Depression with emotional eating behaviors Brenda Russo is struggling with emotional eating and using food for comfort to the extent that it is negatively impacting her health. She often snacks when she is not hungry. [name] sometimes feels she is out of control and then feels guilty that she made poor food choices. She has been working on behavior modification techniques to help reduce her emotional eating and has been somewhat successful. Her blood pressure is controlled and she denies dry mouth. She shows no sign of suicidal or homicidal ideations.  Depression screen [facility] 2/9 05/17/2016 04/12/2016 12/22/2015 11/21/2015 11/18/2015  Decreased Interest 0 0 0 0 0  Down, Depressed, Hopeless 1 0 0 0 0  PHQ - 2 Score  1 0 0 0 0  Altered sleeping 2 - - - -  Tired, decreased energy 1 - - - -  Change in appetite 2 - - - -  Feeling bad or failure about yourself  0 - - - -  Trouble concentrating 2 - - - -  Moving slowly or fidgety/restless 0 - - - -  Suicidal thoughts 0 - - - -  PHQ-9 Score 8 - - - -      ALLERGIES: Allergies  Allergen Reactions  . Eggs Or Egg-Derived Products     Shortness of breath, wheezing    MEDICATIONS: Current Outpatient Medications on File Prior to Visit  Medication Sig Dispense Refill  . albuterol (VENTOLIN HFA) 108 (90 Base) MCG/ACT inhaler Inhale 1-2 puffs into the lungs every 4 (four) hours as needed for wheezing or shortness of breath. 54 g 1  . amLODipine (NORVASC) 5 MG tablet Take 0.5 tablets (2.5 mg total) by mouth daily. 15 tablet 8  . Ascorbic Acid (VITAMIN C) 1000 MG tablet Take 1,000 mg by mouth daily.    Marland Kitchen buPROPion (WELLBUTRIN SR) 100 MG 12 hr tablet Take 1 tablet (100 mg total) by mouth 2 (two) times daily. 60 tablet 0  . Coenzyme Q10 (COQ10) 100 MG CAPS Take 1 capsule by mouth daily.    . fluticasone (FLONASE) 50 MCG/ACT nasal spray Place 2 sprays into both nostrils daily. 16 g 5  . fluticasone furoate-vilanterol (BREO ELLIPTA) 200-25 MCG/INH AEPB  Inhale 1 puff into the lungs daily. 180 each 1  . losartan-hydrochlorothiazide (HYZAAR) 100-12.5 MG tablet Take 0.5 tablets by mouth daily. 15 tablet 8  . magnesium oxide (MAG-OX) 400 MG tablet 2 tablets daily. 400-800 mg daily    . Multiple Minerals-Vitamins (CALCIUM CITRATE PLUS/MAGNESIUM) TABS Take 1 tablet by mouth.    . Multiple Vitamins-Minerals (COMPLETE WOMENS PO) Take 1 capsule by mouth 2 (two) times daily.      . Omega-3 Fatty Acids (FISH OIL) 1000 MG CAPS Take 1 capsule by mouth.     No current facility-administered medications on file prior to visit.     PAST MEDICAL HISTORY: Past Medical History:  Diagnosis Date  . Allergic rhinitis   . Anemia   . Anxiety   . Asthma   . HTN (hypertension)   .  Hyperlipidemia    borderline  . Obese   . OSA (obstructive sleep apnea)    mild ,no CPAP  . Overweight(278.02)   . Plantar wart    history  . Prediabetes   . Shortness of breath     PAST SURGICAL HISTORY: Past Surgical History:  Procedure Laterality Date  . BREAST BIOPSY Right 09/02/2015  . CESAREAN SECTION    . TUBAL LIGATION      SOCIAL HISTORY: Social History   Tobacco Use  . Smoking status: Never Smoker  . Smokeless tobacco: Never Used  Substance Use Topics  . Alcohol use: No  . Drug use: No    FAMILY HISTORY: Family History  Problem Relation Age of Onset  . Heart disease Mother        pacemaker   . Hypertension Mother   . Hyperlipidemia Mother   . Obesity Mother   . Asthma Father   . Pancreatic cancer Brother   . Breast cancer Maternal Aunt   . Colon cancer Neg Hx     ROS: Review of Systems  Constitutional: Negative for weight loss.  HENT:       Negative for dry mouth  Gastrointestinal: Negative for nausea and vomiting.  Musculoskeletal:       Negative for muscle weakness  Psychiatric/Behavioral: Positive for depression. Negative for suicidal ideas.    PHYSICAL EXAM: Blood pressure 121/77, pulse 61, temperature 98.1 F (36.7 C), temperature source Oral, height 5\' 5"  (1.651 m), weight 189 lb (85.7 kg), SpO2 97 %. Body mass index is 31.45 kg/m. Physical Exam  Constitutional: She is oriented to person, place, and time. She appears well-developed and well-nourished.  Cardiovascular: Normal rate.  Pulmonary/Chest: Effort normal.  Musculoskeletal: Normal range of motion.  Neurological: She is oriented to person, place, and time.  Skin: Skin is warm and dry.  Psychiatric: She has a normal mood and affect. Her behavior is normal.  Vitals reviewed.   RECENT LABS AND TESTS: BMET    Component Value Date/Time   NA 144 05/09/2017 0917   K 3.8 05/09/2017 0917   CL 101 05/09/2017 0917   CO2 26 05/09/2017 0917   GLUCOSE 96 05/09/2017 0917    GLUCOSE 100 (H) 06/03/2015 0747   BUN 19 05/09/2017 0917   CREATININE 0.82 05/09/2017 0917   CALCIUM 9.4 05/09/2017 0917   GFRNONAA 78 05/09/2017 0917   GFRAA 90 05/09/2017 0917   Lab Results  Component Value Date   HGBA1C 5.6 05/09/2017   HGBA1C 5.6 12/06/2016   HGBA1C 5.6 05/17/2016   HGBA1C 5.9 09/22/2015   HGBA1C 6.2 06/03/2015   Lab Results  Component Value Date   INSULIN  6.1 05/09/2017   INSULIN 11.2 12/06/2016   INSULIN 9.8 05/17/2016   CBC    Component Value Date/Time   WBC 6.3 05/09/2017 0917   WBC 6.5 11/13/2012 1003   RBC 4.73 05/09/2017 0917   RBC 5.03 11/13/2012 1003   HGB 14.0 05/09/2017 0917   HCT 42.8 05/09/2017 0917   PLT 315 05/09/2017 0917   MCV 91 05/09/2017 0917   MCH 29.6 05/09/2017 0917   MCH 26.9 (A) 05/20/2012 1404   MCHC 32.7 05/09/2017 0917   MCHC 33.5 11/13/2012 1003   RDW 13.3 05/09/2017 0917   LYMPHSABS 1.8 05/09/2017 0917   MONOABS 0.4 11/13/2012 1003   EOSABS 0.1 05/09/2017 0917   BASOSABS 0.0 05/09/2017 0917   Iron/TIBC/Ferritin/ %Sat    Component Value Date/Time   IRON 88 05/20/2012 1422   TIBC 303 05/20/2012 1422   FERRITIN 80 05/20/2012 1422   IRONPCTSAT 29 05/20/2012 1422   Lipid Panel     Component Value Date/Time   CHOL 181 05/09/2017 0917   TRIG 39 05/09/2017 0917   HDL 67 05/09/2017 0917   CHOLHDL 2.7 05/09/2017 0917   CHOLHDL 3 06/03/2015 0747   VLDL 9.0 06/03/2015 0747   LDLCALC 106 (H) 05/09/2017 0917   Hepatic Function Panel     Component Value Date/Time   PROT 6.9 05/09/2017 0917   ALBUMIN 4.0 05/09/2017 0917   AST 22 05/09/2017 0917   ALT 22 05/09/2017 0917   ALKPHOS 94 05/09/2017 0917   BILITOT 0.5 05/09/2017 0917   BILIDIR 0.1 11/13/2012 1003      Component Value Date/Time   TSH 2.460 05/09/2017 0917   TSH 1.770 05/17/2016 0949   TSH 1.23 11/13/2012 1003   Results for KENZA, MUNAR (MRN [id number]) as of 05/23/2017 09:55  Ref. Range 05/09/2017 09:17  Vitamin D, 25-Hydroxy Latest Ref  Range: 30.0 - 100.0 ng/mL 47.2   ASSESSMENT AND PLAN: Vitamin D deficiency - Plan: Ergocalciferol 2500 units CAPS  Essential hypertension  Other depression - With emotional eating  Class 1 obesity with serious comorbidity and body mass index (BMI) of 31.0 to 31.9 in adult, unspecified obesity type  PLAN:  Vitamin D Deficiency Tricha was informed that low vitamin D levels contributes to fatigue and are associated with obesity, breast, and colon cancer. She agrees to stop prescription Vit D @50 ,000 IU every week and start to take prescription Vit D 5,000 IU daily. She will follow up for routine testing of vitamin D, at least 2-3 times per year. She was informed of the risk of over-replacement of vitamin D and agrees to not increase her dose unless she discusses this with [name] first. Siham agreed to follow up as directed.  Hypertension We discussed sodium restriction, working on healthy weight loss, and a regular exercise program as the means to achieve improved blood pressure control. Noely agreed with this plan and agreed to follow up as directed. We will continue to monitor her blood pressure as well as her progress with the above lifestyle modifications. She will continue Hyzaar as prescribed and will watch for signs of hypotension as she continues her lifestyle modifications.  Depression with Emotional Eating Behaviors We discussed behavior modification techniques today to help Kiyra deal with her emotional eating and depression. She has agreed to continue Wellbutrin SR 100 mg bid and follow up as directed.  We spent > than 50% of the 15 minute visit on the counseling as documented in the note.  Obesity Tucker is currently in the  action stage of change. As such, her goal is to continue with weight loss efforts She has agreed to keep a food journal with 1300 to 1400 calories and 80 grams of protein daily Dashay has been instructed to work up to a goal of 150 minutes of  combined cardio and strengthening exercise per week for weight loss and overall health benefits. We discussed the following Behavioral Modification Strategies today: planning for success, keep a strict food journal, increasing lean protein intake, increasing vegetables and work on meal planning and easy cooking plans  Keziah has agreed to follow up with our clinic in 2 weeks. She was informed of the importance of frequent follow up visits to maximize her success with intensive lifestyle modifications for her multiple health conditions.  I, Doreene Nest, am acting as transcriptionist for Eber Jones, MD  I have reviewed the above documentation for accuracy and completeness, and I agree with the above. - Ilene Qua, MD

## 2017-05-31 ENCOUNTER — Ambulatory Visit (INDEPENDENT_AMBULATORY_CARE_PROVIDER_SITE_OTHER): Payer: 59 | Admitting: Internal Medicine

## 2017-05-31 ENCOUNTER — Encounter: Payer: Self-pay | Admitting: Internal Medicine

## 2017-05-31 VITALS — BP 126/68 | HR 64 | Temp 98.2°F | Resp 14 | Ht 65.0 in | Wt 194.5 lb

## 2017-05-31 DIAGNOSIS — Z0000 Encounter for general adult medical examination without abnormal findings: Secondary | ICD-10-CM | POA: Diagnosis not present

## 2017-05-31 NOTE — Patient Instructions (Signed)
  GO TO THE FRONT DESK Schedule your next appointment for a  Physical exam    Check the  blood pressure 2 or 3 times a month   Be sure your blood pressure is between 110/65 and  135/85. If it is consistently higher or lower, let me know

## 2017-05-31 NOTE — Progress Notes (Signed)
Subjective:    Patient ID: Brenda Russo, female    DOB: 08/21/1955, 62 y.o.   MRN: [id number]  DOS:  05/31/2017 Type of visit - description : cpx Interval history: No major concerns Wt Readings from Last 3 Encounters:  05/31/17 194 lb 8 oz (88.2 kg)  05/22/17 189 lb (85.7 kg)  05/09/17 188 lb (85.3 kg)     Review of Systems Still has occasional LLQ abdominal discomfort mostly with certain movements or aerobic exercises Asthma under excellent control.  Other than above, a 14 point review of systems is negative    Past Medical History:  Diagnosis Date  . Allergic rhinitis   . Anemia   . Anxiety   . Asthma   . HTN (hypertension)   . Hyperlipidemia    borderline  . Obese   . OSA (obstructive sleep apnea)    mild ,no CPAP  . Overweight(278.02)   . Plantar wart    history  . Prediabetes   . Shortness of breath     Past Surgical History:  Procedure Laterality Date  . BREAST BIOPSY Right 09/02/2015  . CESAREAN SECTION    . TUBAL LIGATION      Social History   Socioeconomic History  . Marital status: Married    Spouse name: Not on file  . Number of children: 3  . Years of education: Not on file  . Highest education level: Not on file  Occupational History  . 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 Financial resource strain: Not on file  . Food insecurity:    Worry: Not on file    Inability: Not on file  . Transportation needs:    Medical: Not on file    Non-medical: Not on file  Tobacco Use  . Smoking status: Never Smoker  . Smokeless tobacco: Never Used  Substance and Sexual Activity  . Alcohol use: No  . Drug use: No  . Sexual activity: Not Currently  Lifestyle  . Physical activity:    Days per week: Not on file    Minutes per session: Not on file  . Stress: Not on file  Relationships  . Social connections:    Talks on phone: Not on file    Gets together: Not on file    Attends religious service: Not on file    Active member of club or organization: Not on file    Attends meetings of clubs or organizations: Not on file    Relationship status: Not on file  . Intimate partner violence:    Fear of current or ex partner: Not on file    Emotionally abused: Not on file    Physically abused: Not on file    Forced sexual activity: Not on file  Other Topics Concern  . Not on file  Social History Narrative   3 biological children   1 adopted child   3 step-children   Household-- pt , husband and a son     Family History  Problem Relation Age of Onset  . Heart disease Mother        pacemaker   . Hypertension Mother   . Hyperlipidemia Mother   . Obesity Mother   . Asthma Father   . Pancreatic cancer Brother   . Breast cancer Maternal Aunt   . Colon cancer Neg Hx      Allergies as of 05/31/2017  Reactions   Eggs Or Egg-derived Products    Shortness of breath, wheezing      Medication List        Accurate as of 05/31/17  4:51 PM. Always use your most recent med list.          albuterol 108 (90 Base) MCG/ACT inhaler Commonly known as:  VENTOLIN HFA Inhale 1-2 puffs into the lungs every 4 (four) hours as needed for wheezing or shortness of breath.   amLODipine 5 MG tablet Commonly known as:  NORVASC Take 5 mg by mouth daily.   buPROPion 100 MG 12 hr tablet Commonly known as:  WELLBUTRIN SR Take 1 tablet (100 mg total) by mouth 2 (two) times daily.   CALCIUM CITRATE PLUS/MAGNESIUM Tabs Take 1 tablet by mouth.   COMPLETE WOMENS PO Take 1 capsule by mouth 2 (two) times daily.   CoQ10 100 MG Caps Take 1 capsule by mouth daily.   Ergocalciferol 2500 units Caps Take 5,000 Units by mouth daily.   Fish Oil 1000 MG Caps Take 1 capsule by mouth.   fluticasone 50 MCG/ACT nasal spray Commonly known as:  FLONASE Place 2 sprays into both nostrils daily.   fluticasone furoate-vilanterol 200-25 MCG/INH Aepb Commonly known as:  BREO  ELLIPTA Inhale 1 puff into the lungs daily.   losartan-hydrochlorothiazide 100-12.5 MG tablet Commonly known as:  HYZAAR Take 0.5 tablets by mouth daily.   magnesium oxide 400 MG tablet Commonly known as:  MAG-OX 2 tablets daily. 400-800 mg daily   vitamin C 1000 MG tablet Take 1,000 mg by mouth daily.          Objective:   Physical Exam BP 126/68 (BP Location: Left Arm, Patient Position: Sitting, Cuff Size: Normal)   Pulse 64   Temp 98.2 F (36.8 C) (Oral)   Resp 14   Ht 5\' 5"  (1.651 m)   Wt 194 lb 8 oz (88.2 kg)   SpO2 96%   BMI 32.37 kg/m  General:   Well developed, well nourished . NAD.  Neck: No  thyromegaly  HEENT:  Normocephalic . Face symmetric, atraumatic Lungs:  CTA B Normal respiratory effort, no intercostal retractions, no accessory muscle use. Heart: RRR,  no murmur.  No pretibial edema bilaterally  Abdomen:  Not distended, soft, slightly TTP at the LLQ.  No mass or rebound Skin: Exposed areas without rash. Not pale. Not jaundice Neurologic:  alert & oriented X3.  Speech normal, gait appropriate for age and unassisted Strength symmetric and appropriate for age.  Psych: Cognition and judgment appear intact.  Cooperative with normal attention span and concentration.  Behavior appropriate. No anxious or depressed appearing.     Assessment & Plan:   Assessment Prediabetes A1c 6.0 (05/2014) HTN Sinus bradycardia-PVCs: Saw cardiology 04/26/2017. She was asx RTC prn Hyperlipidemia Anxiety Obesity Asthma - h/o intubation x2 in the 90s  OSA, no CPAP BTL LLQ abd pain on-off , last GI visit 11-2015, CT abd (-)  PLAN: Prediabetes: Diet controlled, last A1c very good HTN: Ambulatory BPs normal except some mornings goes up to 148/90.  Reluctant to change any of her medication, we agreed to continue Hyzaar, change timing of amlodipine to nighttime.  Monitor BPs, call if BPs still elevated sometimes in the mornings. Asthma: Good med compliance, no  recent rescue inhalers LLQ abdomen: Still issue on and off for years. Obesity: Follow-up by the weight management clinic RTC 1 year

## 2017-05-31 NOTE — Assessment & Plan Note (Signed)
Prediabetes: Diet controlled, last A1c very good HTN: Ambulatory BPs normal except some mornings goes up to 148/90.  Reluctant to change any of her medication, we agreed to continue Hyzaar, change timing of amlodipine to nighttime.  Monitor BPs, call if BPs still elevated sometimes in the mornings. Asthma: Good med compliance, no recent rescue inhalers LLQ abdomen: Still issue on and off for years. Obesity: Follow-up by the weight management clinic RTC 1 year

## 2017-05-31 NOTE — Progress Notes (Signed)
Pre visit review using our clinic review tool, if applicable. No additional management support is needed unless otherwise documented below in the visit note. 

## 2017-05-31 NOTE — Assessment & Plan Note (Addendum)
--  Tdap ,pnm shot: declined     --Female care:  PAPs due to see  Dr. Garwin Brothers , declined referral  Last MMG 08-2015, abnormal, had a Bx (-);  per pt had a mmg 2018 as well, no reports, plans to d/w gyn --[facility]--08/19/08- normal, Dr Ardis Hughs,  follow-up in 10 years --dexa 07-2015 WNL  --diet and exercise: Doing well, eating better, has not lost much weight yet. --Labs reviewed, all okay.  Declined hep C or HIV

## 2017-06-06 ENCOUNTER — Ambulatory Visit (INDEPENDENT_AMBULATORY_CARE_PROVIDER_SITE_OTHER): Payer: 59 | Admitting: Family Medicine

## 2017-06-06 VITALS — BP 113/70 | HR 64 | Temp 98.1°F | Ht 65.0 in | Wt 190.0 lb

## 2017-06-06 DIAGNOSIS — E669 Obesity, unspecified: Secondary | ICD-10-CM | POA: Diagnosis not present

## 2017-06-06 DIAGNOSIS — R109 Unspecified abdominal pain: Secondary | ICD-10-CM | POA: Diagnosis not present

## 2017-06-06 DIAGNOSIS — F3289 Other specified depressive episodes: Secondary | ICD-10-CM

## 2017-06-06 DIAGNOSIS — Z6831 Body mass index (BMI) 31.0-31.9, adult: Secondary | ICD-10-CM

## 2017-06-06 DIAGNOSIS — I10 Essential (primary) hypertension: Secondary | ICD-10-CM

## 2017-06-07 NOTE — Progress Notes (Signed)
Office: [phone]  /  Fax: [phone]   HPI:   Chief Complaint: OBESITY Brenda Russo is here to discuss her progress with her obesity treatment plan. She is on the keep a food journal with 1300 to 1400 calories and 80 grams of protein daily and is following her eating plan approximately 40 % of the time. She states she is walking for 60 minutes 1 times per week. Brenda Russo went out of town and was sick. She struggled with falling into bad habits when she got back home. She had lots of snacking when she got home. Her weight is 190 lb (86.2 kg) today and has had a weight gain of 1 pound over a period of 2 weeks since her last visit. She has lost 10 lbs since starting treatment with [name].  Hypertension Brenda Russo is a 62 y.o. female with hypertension. Brenda Russo denies chest pain, chest pressure or headache. She is working weight loss to help control her blood pressure with the goal of decreasing her risk of heart attack and stroke. Alvarados blood pressure is well controlled today.  Left Flank Pain Brenda Russo has not had weight loss and left flank pain is worse after dancing. She denies fevers, nausea or vomiting.  Depression with emotional eating behaviors Brenda Russo stopped bupropion secondary to elevated blood pressure. She is struggling with snacking. Brenda Russo struggles with emotional eating and using food for comfort to the extent that it is negatively impacting her health. She often snacks when she is not hungry. Brenda Russo sometimes feels she is out of control and then feels guilty that she made poor food choices. She has been working on behavior modification techniques to help reduce her emotional eating and has been somewhat successful. She shows no sign of suicidal or homicidal ideations.  Depression screen [facility] 2/9 05/17/2016 04/12/2016 12/22/2015 11/21/2015 11/18/2015  Decreased Interest 0 0 0 0 0  Down, Depressed, Hopeless 1 0 0 0 0  PHQ - 2 Score 1 0 0 0 0  Altered sleeping  2 - - - -  Tired, decreased energy 1 - - - -  Change in appetite 2 - - - -  Feeling bad or failure about yourself  0 - - - -  Trouble concentrating 2 - - - -  Moving slowly or fidgety/restless 0 - - - -  Suicidal thoughts 0 - - - -  PHQ-9 Score 8 - - - -      ALLERGIES: Allergies  Allergen Reactions  . Eggs Or Egg-Derived Products     Shortness of breath, wheezing    MEDICATIONS: Current Outpatient Medications on File Prior to Visit  Medication Sig Dispense Refill  . albuterol (VENTOLIN HFA) 108 (90 Base) MCG/ACT inhaler Inhale 1-2 puffs into the lungs every 4 (four) hours as needed for wheezing or shortness of breath. 54 g 1  . amLODipine (NORVASC) 5 MG tablet Take 5 mg by mouth daily.    . Ascorbic Acid (VITAMIN C) 1000 MG tablet Take 1,000 mg by mouth daily.    Marland Kitchen buPROPion (WELLBUTRIN SR) 100 MG 12 hr tablet Take 1 tablet (100 mg total) by mouth 2 (two) times daily. 60 tablet 0  . Coenzyme Q10 (COQ10) 100 MG CAPS Take 1 capsule by mouth daily.    . Ergocalciferol 2500 units CAPS Take 5,000 Units by mouth daily. (Patient not taking: Reported on 05/31/2017) 30 capsule 0  . fluticasone (FLONASE) 50 MCG/ACT nasal spray Place 2 sprays into both nostrils daily. 16 g  5  . fluticasone furoate-vilanterol (BREO ELLIPTA) 200-25 MCG/INH AEPB Inhale 1 puff into the lungs daily. 180 each 1  . losartan-hydrochlorothiazide (HYZAAR) 100-12.5 MG tablet Take 0.5 tablets by mouth daily. 15 tablet 8  . magnesium oxide (MAG-OX) 400 MG tablet 2 tablets daily. 400-800 mg daily    . Multiple Minerals-Vitamins (CALCIUM CITRATE PLUS/MAGNESIUM) TABS Take 1 tablet by mouth.    . Multiple Vitamins-Minerals (COMPLETE WOMENS PO) Take 1 capsule by mouth 2 (two) times daily.      . Omega-3 Fatty Acids (FISH OIL) 1000 MG CAPS Take 1 capsule by mouth.     No current facility-administered medications on file prior to visit.     PAST MEDICAL HISTORY: Past Medical History:  Diagnosis Date  . Allergic rhinitis     . Anemia   . Anxiety   . Asthma   . HTN (hypertension)   . Hyperlipidemia    borderline  . Obese   . OSA (obstructive sleep apnea)    mild ,no CPAP  . Overweight(278.02)   . Plantar wart    history  . Prediabetes   . Shortness of breath     PAST SURGICAL HISTORY: Past Surgical History:  Procedure Laterality Date  . BREAST BIOPSY Right 09/02/2015  . CESAREAN SECTION    . TUBAL LIGATION      SOCIAL HISTORY: Social History   Tobacco Use  . Smoking status: Never Smoker  . Smokeless tobacco: Never Used  Substance Use Topics  . Alcohol use: No  . Drug use: No    FAMILY HISTORY: Family History  Problem Relation Age of Onset  . Heart disease Mother        pacemaker   . Hypertension Mother   . Hyperlipidemia Mother   . Obesity Mother   . Asthma Father   . Pancreatic cancer Brother   . Breast cancer Maternal Aunt   . Colon cancer Neg Hx     ROS: Review of Systems  Constitutional: Negative for fever and weight loss.  Cardiovascular: Negative for chest pain.       Negative for chest pressure  Gastrointestinal: Negative for nausea and vomiting.  Musculoskeletal:       Positive for Left Flank Pain  Neurological: Negative for headaches.  Psychiatric/Behavioral: Positive for depression. Negative for suicidal ideas.    PHYSICAL EXAM: Blood pressure 113/70, pulse 64, temperature 98.1 F (36.7 C), height 5\' 5"  (1.651 m), weight 190 lb (86.2 kg), SpO2 98 %. Body mass index is 31.62 kg/m. Physical Exam  Constitutional: She is oriented to person, place, and time. She appears well-developed and well-nourished.  Cardiovascular: Normal rate.  Pulmonary/Chest: Effort normal.  Musculoskeletal: Normal range of motion.  Neurological: She is oriented to person, place, and time.  Skin: Skin is warm and dry.  Psychiatric: She has a normal mood and affect. Her behavior is normal.  Vitals reviewed.   RECENT LABS AND TESTS: BMET    Component Value Date/Time   NA 144  05/09/2017 0917   K 3.8 05/09/2017 0917   CL 101 05/09/2017 0917   CO2 26 05/09/2017 0917   GLUCOSE 96 05/09/2017 0917   GLUCOSE 100 (H) 06/03/2015 0747   BUN 19 05/09/2017 0917   CREATININE 0.82 05/09/2017 0917   CALCIUM 9.4 05/09/2017 0917   GFRNONAA 78 05/09/2017 0917   GFRAA 90 05/09/2017 0917   Lab Results  Component Value Date   HGBA1C 5.6 05/09/2017   HGBA1C 5.6 12/06/2016   HGBA1C 5.6 05/17/2016  HGBA1C 5.9 09/22/2015   HGBA1C 6.2 06/03/2015   Lab Results  Component Value Date   INSULIN 6.1 05/09/2017   INSULIN 11.2 12/06/2016   INSULIN 9.8 05/17/2016   CBC    Component Value Date/Time   WBC 6.3 05/09/2017 0917   WBC 6.5 11/13/2012 1003   RBC 4.73 05/09/2017 0917   RBC 5.03 11/13/2012 1003   HGB 14.0 05/09/2017 0917   HCT 42.8 05/09/2017 0917   PLT 315 05/09/2017 0917   MCV 91 05/09/2017 0917   MCH 29.6 05/09/2017 0917   MCH 26.9 (A) 05/20/2012 1404   MCHC 32.7 05/09/2017 0917   MCHC 33.5 11/13/2012 1003   RDW 13.3 05/09/2017 0917   LYMPHSABS 1.8 05/09/2017 0917   MONOABS 0.4 11/13/2012 1003   EOSABS 0.1 05/09/2017 0917   BASOSABS 0.0 05/09/2017 0917   Iron/TIBC/Ferritin/ %Sat    Component Value Date/Time   IRON 88 05/20/2012 1422   TIBC 303 05/20/2012 1422   FERRITIN 80 05/20/2012 1422   IRONPCTSAT 29 05/20/2012 1422   Lipid Panel     Component Value Date/Time   CHOL 181 05/09/2017 0917   TRIG 39 05/09/2017 0917   HDL 67 05/09/2017 0917   CHOLHDL 2.7 05/09/2017 0917   CHOLHDL 3 06/03/2015 0747   VLDL 9.0 06/03/2015 0747   LDLCALC 106 (H) 05/09/2017 0917   Hepatic Function Panel     Component Value Date/Time   PROT 6.9 05/09/2017 0917   ALBUMIN 4.0 05/09/2017 0917   AST 22 05/09/2017 0917   ALT 22 05/09/2017 0917   ALKPHOS 94 05/09/2017 0917   BILITOT 0.5 05/09/2017 0917   BILIDIR 0.1 11/13/2012 1003      Component Value Date/Time   TSH 2.460 05/09/2017 0917   TSH 1.770 05/17/2016 0949   TSH 1.23 11/13/2012 1003   Results for  ANALIE, KATZMAN (MRN [id number]) as of 06/07/2017 17:15  Ref. Range 05/09/2017 09:17  Vitamin D, 25-Hydroxy Latest Ref Range: 30.0 - 100.0 ng/mL 47.2   ASSESSMENT AND PLAN: Essential hypertension  Left flank pain  Other depression - with emotional eating  Class 1 obesity with serious comorbidity and body mass index (BMI) of 31.0 to 31.9 in adult, unspecified obesity type  PLAN:  Hypertension We discussed sodium restriction, working on healthy weight loss, and a regular exercise program as the means to achieve improved blood pressure control. Toshiye agreed with this plan and agreed to follow up as directed. We will continue to monitor her blood pressure as well as her progress with the above lifestyle modifications. She will continue amlodipine and hyzaar and will watch for signs of hypotension as she continues her lifestyle modifications.  Left Flank Pain Discussed with patient to go see doctor for possible OMM (osteopathic manipulative medicine).  Depression with Emotional Eating Behaviors We discussed emotional eating tactics today to help Eather deal with her emotional eating. She agreed to follow up as directed.  We spent > than 50% of the 15 minute visit on the counseling as documented in the note.   Obesity Leveta is currently in the action stage of change. As such, her goal is to continue with weight loss efforts She has agreed to keep a food journal with 1300 to 1400 calories and 80 grams of protein daily Undine has been instructed to work up to a goal of 150 minutes of combined cardio and strengthening exercise per week for weight loss and overall health benefits. We discussed the following Behavioral Modification Strategies today: better snacking  choices, keeping healthy foods in the home, increasing lean protein intake, emotional eating strategies and ways to avoid boredom eating  [name] has agreed to follow up with our clinic in 2 weeks. She was informed of the  importance of frequent follow up visits to maximize her success with intensive lifestyle modifications for her multiple health conditions.  I, Doreene Nest, am acting as transcriptionist for Eber Jones, MD  I have reviewed the above documentation for accuracy and completeness, and I agree with the above. - Ilene Qua, MD

## 2017-06-13 ENCOUNTER — Other Ambulatory Visit (INDEPENDENT_AMBULATORY_CARE_PROVIDER_SITE_OTHER): Payer: Self-pay | Admitting: Family Medicine

## 2017-06-13 ENCOUNTER — Other Ambulatory Visit: Payer: Self-pay | Admitting: Internal Medicine

## 2017-06-14 ENCOUNTER — Other Ambulatory Visit: Payer: Self-pay

## 2017-06-14 MED ORDER — FLUTICASONE FUROATE-VILANTEROL 200-25 MCG/INH IN AEPB
1.0000 | INHALATION_SPRAY | Freq: Every day | RESPIRATORY_TRACT | 3 refills | Status: DC
Start: 2017-06-14 — End: 2018-07-10

## 2017-06-14 MED FILL — BREO ELLIPTA 200-25 MCG INH: 200-25 | 90 days supply | Qty: 180 | Fill #0

## 2017-06-16 ENCOUNTER — Telehealth: Payer: Self-pay | Admitting: Internal Medicine

## 2017-06-16 ENCOUNTER — Ambulatory Visit: Payer: Self-pay | Admitting: *Deleted

## 2017-06-16 MED FILL — AMLODIPINE BESYLATE 5 MG TA: 5 | 90 days supply | Qty: 90 | Fill #0

## 2017-06-16 NOTE — Telephone Encounter (Signed)
See triage note from 06/16/17

## 2017-06-16 NOTE — Telephone Encounter (Signed)
Copied from [place] [phone]. Topic: Quick Communication - Rx Refill/Question >> Jun 16, 2017 11:13 AM Boyd Kerbs wrote: Medication:   losartan-hydrochlorothiazide (HYZAAR) 100-12.5 MG tablet  Pt is asking dr. To put in new prescription for 1 pill a day (not 1/2 pill) She is needing this today  Has the patient contacted their pharmacy? Yes.   (Agent: If no, request that the patient contact the pharmacy for the refill.) Preferred Pharmacy (with phone number or street name):   [place] [facility]    Agent: Please be advised that RX refills may take up to 3 business days. We ask that you follow-up with your pharmacy.

## 2017-06-16 NOTE — Telephone Encounter (Signed)
Recommend to check her blood pressures daily, write the readings down and call me with readings next week.  If her BP is consistently elevated we could increase losartan to a full tablet daily.

## 2017-06-16 NOTE — Telephone Encounter (Signed)
Pt called because she had taken more of her b/p med that had been  Prescribed. She states that when she gets stressed she take a whole Hyzaar 100-12.5 mg and not the half as prescribed. She denies any cardiac symptoms. Her mother- in-law is not doing well so she is stressed.  Notified her flow at her pcp regarding the request for refill. Pt advised to call 911 or go to ED if she starts having any cardiac symptoms. Pt voiced understanding. Appointment made per protocol.  Reason for Disposition . Systolic BP  >= 128 OR Diastolic >= 118  Answer Assessment - Initial Assessment Questions 1. BLOOD PRESSURE: "What is the blood pressure?" "Did you take at least two measurements 5 minutes apart?"     163/86 HR 64 2. ONSET: "When did you take your blood pressure?"     now 3. HOW: "How did you obtain the blood pressure?" (e.g., visiting nurse, automatic home BP monitor)     Automatic BP monitor 4. HISTORY: "Do you have a history of high blood pressure?"     yes 5. MEDICATIONS: "Are you taking any medications for blood pressure?" "Have you missed any doses recently?"     Yes and have not missed any doses 6. OTHER SYMPTOMS: "Do you have any symptoms?" (e.g., headache, chest pain, blurred vision, difficulty breathing, weakness)     no 7. PREGNANCY: "Is there any chance you are pregnant?" "When was your last menstrual period?"     no  Protocols used: HIGH BLOOD PRESSURE-A-AH

## 2017-06-16 NOTE — Telephone Encounter (Signed)
LMOM informing Pt of recommendations.  °

## 2017-06-16 NOTE — Telephone Encounter (Signed)
FYI. Please advise.

## 2017-06-19 ENCOUNTER — Encounter: Payer: Self-pay | Admitting: Internal Medicine

## 2017-06-19 ENCOUNTER — Ambulatory Visit: Payer: 59 | Admitting: Internal Medicine

## 2017-06-19 VITALS — BP 134/74 | HR 60 | Temp 97.5°F | Resp 14 | Ht 65.0 in | Wt 197.1 lb

## 2017-06-19 DIAGNOSIS — I10 Essential (primary) hypertension: Secondary | ICD-10-CM | POA: Diagnosis not present

## 2017-06-19 MED ORDER — LOSARTAN POTASSIUM-HCTZ 100-12.5 MG PO TABS: 1.0000 | ORAL_TABLET | Freq: Every day | ORAL | 2 refills | Status: DC

## 2017-06-19 MED FILL — LOSARTAN-HCTZ 100-12.5 MG T: 100-12.5 | 90 days supply | Qty: 90 | Fill #0

## 2017-06-19 NOTE — Progress Notes (Signed)
Subjective:    Patient ID: Brenda Russo, female    DOB: July 21, 1955, 62 y.o.   MRN: [id number]  DOS:  06/19/2017 Type of visit - description : f/u Interval history: Concerned about her blood pressure, it has been going up to the 140s and actually  As high as 163/86. She takes mostly 1/2 tablet of Hyzaar but sometimes take a whole one.  When she takes a whole tablet BP goes to 119/70. + Stress, related to the other mother-in-law's health  BP Readings from Last 3 Encounters:  06/19/17 134/74  06/06/17 113/70  05/31/17 126/68   Wt Readings from Last 3 Encounters:  06/19/17 197 lb 2 oz (89.4 kg)  06/06/17 190 lb (86.2 kg)  05/31/17 194 lb 8 oz (88.2 kg)     Review of Systems  Denies chest pain, difficulty breathing or edema Past Medical History:  Diagnosis Date  . Allergic rhinitis   . Anemia   . Anxiety   . Asthma   . HTN (hypertension)   . Hyperlipidemia    borderline  . Obese   . OSA (obstructive sleep apnea)    mild ,no CPAP  . Overweight(278.02)   . Plantar wart    history  . Prediabetes   . Shortness of breath     Past Surgical History:  Procedure Laterality Date  . BREAST BIOPSY Right 09/02/2015  . CESAREAN SECTION    . TUBAL LIGATION      Social History   Socioeconomic History  . Marital status: Married    Spouse name: Not on file  . Number of children: 3  . Years of education: Not on file  . Highest education level: Not on file  Occupational History  . 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 Financial resource strain: Not on file  . Food insecurity:    Worry: Not on file    Inability: Not on file  . Transportation needs:    Medical: Not on file    Non-medical: Not on file  Tobacco Use  . Smoking status: Never Smoker  . Smokeless tobacco: Never Used  Substance and Sexual Activity  . Alcohol use: No  . Drug use: No  . Sexual activity: Not Currently  Lifestyle  .  Physical activity:    Days per week: Not on file    Minutes per session: Not on file  . Stress: Not on file  Relationships  . Social connections:    Talks on phone: Not on file    Gets together: Not on file    Attends religious service: Not on file    Active member of club or organization: Not on file    Attends meetings of clubs or organizations: Not on file    Relationship status: Not on file  . Intimate partner violence:    Fear of current or ex partner: Not on file    Emotionally abused: Not on file    Physically abused: Not on file    Forced sexual activity: Not on file  Other Topics Concern  . Not on file  Social History Narrative   3 biological children   1 adopted child   3 step-children   Household-- pt , husband and a son      Allergies as of 06/19/2017      Reactions   Eggs Or Egg-derived Products    Shortness of breath, wheezing  Medication List        Accurate as of 06/19/17  8:47 PM. Always use your most recent med list.          albuterol 108 (90 Base) MCG/ACT inhaler Commonly known as:  VENTOLIN HFA Inhale 1-2 puffs into the lungs every 4 (four) hours as needed for wheezing or shortness of breath.   amLODipine 5 MG tablet Commonly known as:  NORVASC Take 1 tablet (5 mg total) by mouth daily.   buPROPion 100 MG 12 hr tablet Commonly known as:  WELLBUTRIN SR Take 1 tablet (100 mg total) by mouth 2 (two) times daily.   CALCIUM CITRATE PLUS/MAGNESIUM Tabs Take 1 tablet by mouth.   COMPLETE WOMENS PO Take 1 capsule by mouth 2 (two) times daily.   CoQ10 100 MG Caps Take 1 capsule by mouth daily.   Ergocalciferol 2500 units Caps Take 5,000 Units by mouth daily.   Fish Oil 1000 MG Caps Take 1 capsule by mouth.   fluticasone 50 MCG/ACT nasal spray Commonly known as:  FLONASE Place 2 sprays into both nostrils daily.   fluticasone furoate-vilanterol 200-25 MCG/INH Aepb Commonly known as:  BREO ELLIPTA Inhale 1 puff into the lungs  daily.   losartan-hydrochlorothiazide 100-12.5 MG tablet Commonly known as:  HYZAAR Take 1 tablet by mouth daily.   magnesium oxide 400 MG tablet Commonly known as:  MAG-OX 2 tablets daily. 400-800 mg daily   vitamin C 1000 MG tablet Take 1,000 mg by mouth daily.          Objective:   Physical Exam BP 134/74 (BP Location: Left Arm, Patient Position: Sitting, Cuff Size: Small)   Pulse 60   Temp (!) 97.5 F (36.4 C) (Oral)   Resp 14   Ht 5\' 5"  (1.651 m)   Wt 197 lb 2 oz (89.4 kg)   SpO2 96%   BMI 32.80 kg/m  General:   Well developed, well nourished . NAD.  HEENT:  Normocephalic . Face symmetric, atraumatic Lungs:  CTA B Normal respiratory effort, no intercostal retractions, no accessory muscle use. Heart: RRR,  no murmur.  No pretibial edema bilaterally  Skin: Not pale. Not jaundice Neurologic:  alert & oriented X3.  Speech normal, gait appropriate for age and unassisted Psych--  Cognition and judgment appear intact.  Cooperative with normal attention span and concentration.  Behavior appropriate. No anxious or depressed appearing.      Assessment & Plan:  Assessment Prediabetes A1c 6.0 (05/2014) HTN Sinus bradycardia-PVCs: Saw cardiology 04/26/2017. She was asx RTC prn Hyperlipidemia Anxiety Obesity Asthma - h/o intubation x2 in the 90s  OSA, no CPAP BTL LLQ abd pain on-off , last GI visit 11-2015, CT abd (-)  PLAN: HTN: Lately BP has been elevated, one time was 162/86. Recommend to continue with amlodipine and will increase permanently Hyzaar to 1 tablet daily.  Monitor BPs, drop Hyzaar back to half tablet daily if BP is consistently less than 110. Doing great with diet and exercise. Stress: See HPI, counseled. RTC  next April for a CPX.  Sooner if needed.

## 2017-06-19 NOTE — Assessment & Plan Note (Signed)
HTN: Lately BP has been elevated, one time was 162/86. Recommend to continue with amlodipine and will increase permanently Hyzaar to 1 tablet daily.  Monitor BPs, drop Hyzaar back to half tablet daily if BP is consistently less than 110. Doing great with diet and exercise. Stress: See HPI, counseled. RTC  next April for a CPX.  Sooner if needed.

## 2017-06-19 NOTE — Progress Notes (Signed)
Pre visit review using our clinic review tool, if applicable. No additional management support is needed unless otherwise documented below in the visit note. 

## 2017-06-19 NOTE — Patient Instructions (Signed)
   Check the  blood pressure  weekly   Be sure your blood pressure is between 110/65 and  135/85. If it is consistently higher or lower, let me know

## 2017-06-21 ENCOUNTER — Ambulatory Visit (INDEPENDENT_AMBULATORY_CARE_PROVIDER_SITE_OTHER): Payer: 59 | Admitting: Family Medicine

## 2017-06-21 VITALS — BP 118/72 | HR 60 | Temp 97.7°F | Ht 65.0 in | Wt 192.0 lb

## 2017-06-21 DIAGNOSIS — Z6832 Body mass index (BMI) 32.0-32.9, adult: Secondary | ICD-10-CM

## 2017-06-21 DIAGNOSIS — E669 Obesity, unspecified: Secondary | ICD-10-CM

## 2017-06-21 DIAGNOSIS — F3289 Other specified depressive episodes: Secondary | ICD-10-CM

## 2017-06-21 DIAGNOSIS — I10 Essential (primary) hypertension: Secondary | ICD-10-CM

## 2017-06-22 NOTE — Progress Notes (Signed)
Office: [phone]  /  Fax: [phone]   HPI:   Chief Complaint: OBESITY Brenda Russo is here to discuss her progress with her obesity treatment plan. She is on the keep a food journal with 1300-1400 calories and 80 grams of protein daily and is following her eating plan approximately 40 % of the time. She states she is walking for 30 minutes 2-3 times per week. Brenda Russo has had 2 major life stressors. Mother in-law going on Hospice and [place] being born.  Her weight is 192 lb (87.1 kg) today and has gained 2 pounds since her last visit. She has lost 8 lbs since starting treatment with [name].  Hypertension Brenda Russo is a 62 y.o. female with hypertension. Brenda Russo's blood pressure is controlled today. She denies chest pain, chest pressure, or headache. She is working weight loss to help control her blood pressure with the goal of decreasing her risk of heart attack and stroke.   Depression with emotional eating behaviors Brenda Russo notes cravings for snacks. Brenda Russo struggles with emotional eating and using food for comfort to the extent that it is negatively impacting her health. She often snacks when she is not hungry. Brenda Russo sometimes feels she is out of control and then feels guilty that she made poor food choices. She has been working on behavior modification techniques to help reduce her emotional eating and has been somewhat successful. She shows no sign of suicidal or homicidal ideations.  Depression screen [facility] 2/9 05/17/2016 04/12/2016 12/22/2015 11/21/2015 11/18/2015  Decreased Interest 0 0 0 0 0  Down, Depressed, Hopeless 1 0 0 0 0  PHQ - 2 Score 1 0 0 0 0  Altered sleeping 2 - - - -  Tired, decreased energy 1 - - - -  Change in appetite 2 - - - -  Feeling bad or failure about yourself  0 - - - -  Trouble concentrating 2 - - - -  Moving slowly or fidgety/restless 0 - - - -  Suicidal thoughts 0 - - - -  PHQ-9 Score 8 - - - -    ALLERGIES: Allergies  Allergen  Reactions  . Eggs Or Egg-Derived Products     Shortness of breath, wheezing    MEDICATIONS: Current Outpatient Medications on File Prior to Visit  Medication Sig Dispense Refill  . albuterol (VENTOLIN HFA) 108 (90 Base) MCG/ACT inhaler Inhale 1-2 puffs into the lungs every 4 (four) hours as needed for wheezing or shortness of breath. 54 g 1  . amLODipine (NORVASC) 5 MG tablet Take 1 tablet (5 mg total) by mouth daily. 90 tablet 3  . Ascorbic Acid (VITAMIN C) 1000 MG tablet Take 1,000 mg by mouth daily.    Marland Kitchen buPROPion (WELLBUTRIN SR) 100 MG 12 hr tablet Take 1 tablet (100 mg total) by mouth 2 (two) times daily. 60 tablet 0  . Coenzyme Q10 (COQ10) 100 MG CAPS Take 1 capsule by mouth daily.    . Ergocalciferol 2500 units CAPS Take 5,000 Units by mouth daily. 30 capsule 0  . fluticasone (FLONASE) 50 MCG/ACT nasal spray Place 2 sprays into both nostrils daily. 16 g 5  . fluticasone furoate-vilanterol (BREO ELLIPTA) 200-25 MCG/INH AEPB Inhale 1 puff into the lungs daily. 180 each 3  . losartan-hydrochlorothiazide (HYZAAR) 100-12.5 MG tablet Take 1 tablet by mouth daily. 90 tablet 2  . magnesium oxide (MAG-OX) 400 MG tablet 2 tablets daily. 400-800 mg daily    . Multiple Minerals-Vitamins (CALCIUM CITRATE PLUS/MAGNESIUM) TABS Take 1  tablet by mouth.    . Multiple Vitamins-Minerals (COMPLETE WOMENS PO) Take 1 capsule by mouth 2 (two) times daily.      . Omega-3 Fatty Acids (FISH OIL) 1000 MG CAPS Take 1 capsule by mouth.     No current facility-administered medications on file prior to visit.     PAST MEDICAL HISTORY: Past Medical History:  Diagnosis Date  . Allergic rhinitis   . Anemia   . Anxiety   . Asthma   . HTN (hypertension)   . Hyperlipidemia    borderline  . Obese   . OSA (obstructive sleep apnea)    mild ,no CPAP  . Overweight(278.02)   . Plantar wart    history  . Prediabetes   . Shortness of breath     PAST SURGICAL HISTORY: Past Surgical History:  Procedure  Laterality Date  . BREAST BIOPSY Right 09/02/2015  . CESAREAN SECTION    . TUBAL LIGATION      SOCIAL HISTORY: Social History   Tobacco Use  . Smoking status: Never Smoker  . Smokeless tobacco: Never Used  Substance Use Topics  . Alcohol use: No  . Drug use: No    FAMILY HISTORY: Family History  Problem Relation Age of Onset  . Heart disease Mother        pacemaker   . Hypertension Mother   . Hyperlipidemia Mother   . Obesity Mother   . Asthma Father   . Pancreatic cancer Brother   . Breast cancer Maternal Aunt   . Colon cancer Neg Hx     ROS: Review of Systems  Constitutional: Negative for weight loss.  Cardiovascular: Negative for chest pain.       Negative chest pressure  Neurological: Negative for headaches.  Psychiatric/Behavioral: Positive for depression. Negative for suicidal ideas.    PHYSICAL EXAM: Blood pressure 118/72, pulse 60, temperature 97.7 F (36.5 C), temperature source Oral, height 5\' 5"  (1.651 m), weight 192 lb (87.1 kg), SpO2 95 %. Body mass index is 31.95 kg/m. Physical Exam  Constitutional: She is oriented to person, place, and time. She appears well-developed and well-nourished.  Cardiovascular: Normal rate.  Pulmonary/Chest: Effort normal.  Musculoskeletal: Normal range of motion.  Neurological: She is oriented to person, place, and time.  Skin: Skin is warm and dry.  Psychiatric: She has a normal mood and affect. Her behavior is normal.  Vitals reviewed.   RECENT LABS AND TESTS: BMET    Component Value Date/Time   NA 144 05/09/2017 0917   K 3.8 05/09/2017 0917   CL 101 05/09/2017 0917   CO2 26 05/09/2017 0917   GLUCOSE 96 05/09/2017 0917   GLUCOSE 100 (H) 06/03/2015 0747   BUN 19 05/09/2017 0917   CREATININE 0.82 05/09/2017 0917   CALCIUM 9.4 05/09/2017 0917   GFRNONAA 78 05/09/2017 0917   GFRAA 90 05/09/2017 0917   Lab Results  Component Value Date   HGBA1C 5.6 05/09/2017   HGBA1C 5.6 12/06/2016   HGBA1C 5.6  05/17/2016   HGBA1C 5.9 09/22/2015   HGBA1C 6.2 06/03/2015   Lab Results  Component Value Date   INSULIN 6.1 05/09/2017   INSULIN 11.2 12/06/2016   INSULIN 9.8 05/17/2016   CBC    Component Value Date/Time   WBC 6.3 05/09/2017 0917   WBC 6.5 11/13/2012 1003   RBC 4.73 05/09/2017 0917   RBC 5.03 11/13/2012 1003   HGB 14.0 05/09/2017 0917   HCT 42.8 05/09/2017 0917   PLT 315 05/09/2017 0917  MCV 91 05/09/2017 0917   MCH 29.6 05/09/2017 0917   MCH 26.9 (A) 05/20/2012 1404   MCHC 32.7 05/09/2017 0917   MCHC 33.5 11/13/2012 1003   RDW 13.3 05/09/2017 0917   LYMPHSABS 1.8 05/09/2017 0917   MONOABS 0.4 11/13/2012 1003   EOSABS 0.1 05/09/2017 0917   BASOSABS 0.0 05/09/2017 0917   Iron/TIBC/Ferritin/ %Sat    Component Value Date/Time   IRON 88 05/20/2012 1422   TIBC 303 05/20/2012 1422   FERRITIN 80 05/20/2012 1422   IRONPCTSAT 29 05/20/2012 1422   Lipid Panel     Component Value Date/Time   CHOL 181 05/09/2017 0917   TRIG 39 05/09/2017 0917   HDL 67 05/09/2017 0917   CHOLHDL 2.7 05/09/2017 0917   CHOLHDL 3 06/03/2015 0747   VLDL 9.0 06/03/2015 0747   LDLCALC 106 (H) 05/09/2017 0917   Hepatic Function Panel     Component Value Date/Time   PROT 6.9 05/09/2017 0917   ALBUMIN 4.0 05/09/2017 0917   AST 22 05/09/2017 0917   ALT 22 05/09/2017 0917   ALKPHOS 94 05/09/2017 0917   BILITOT 0.5 05/09/2017 0917   BILIDIR 0.1 11/13/2012 1003      Component Value Date/Time   TSH 2.460 05/09/2017 0917   TSH 1.770 05/17/2016 0949   TSH 1.23 11/13/2012 1003    ASSESSMENT AND PLAN: Essential hypertension  Other depression - with emotional eating   Class 1 obesity with serious comorbidity and body mass index (BMI) of 32.0 to 32.9 in adult, unspecified obesity type  PLAN:  Hypertension We discussed sodium restriction, working on healthy weight loss, and a regular exercise program as the means to achieve improved blood pressure control. Brenda Russo agreed with this plan  and agreed to follow up as directed. We will continue to monitor her blood pressure as well as her progress with the above lifestyle modifications. She will continue her current medications and will watch for signs of hypotension as she continues her lifestyle modifications. Brenda Russo agrees to follow up with our clinic in 3 weeks.  Depression with Emotional Eating Behaviors We discussed behavior modification techniques today to help Brenda Russo deal with her emotional eating and depression. Brenda Russo agrees to off on Wellbutrin and will follow up at next visit. Brenda Russo agrees to follow up with our clinic in 3 weeks.  We spent > than 50% of the 15 minute visit on the counseling as documented in the note.  Obesity Brenda Russo is currently in the action stage of change. As such, her goal is to continue with weight loss efforts She has agreed to keep a food journal with 1400 calories and 80 grams of protein daily Brenda Russo has been instructed to work up to a goal of 150 minutes of combined cardio and strengthening exercise per week for weight loss and overall health benefits. We discussed the following Behavioral Modification Strategies today: increasing lean protein intake, increasing vegetables, work on meal planning and easy cooking plans, better snacking choices, and planning for success   Brenda Russo has agreed to follow up with our clinic in 3 weeks. She was informed of the importance of frequent follow up visits to maximize her success with intensive lifestyle modifications for her multiple health conditions.   I, Trixie Dredge, am acting as transcriptionist for Ilene Qua, MD  I have reviewed the above documentation for accuracy and completeness, and I agree with the above. - Ilene Qua, MD

## 2017-07-03 ENCOUNTER — Encounter: Payer: Self-pay | Admitting: Family Medicine

## 2017-07-05 DIAGNOSIS — H524 Presbyopia: Secondary | ICD-10-CM | POA: Diagnosis not present

## 2017-07-05 DIAGNOSIS — H25813 Combined forms of age-related cataract, bilateral: Secondary | ICD-10-CM | POA: Diagnosis not present

## 2017-07-13 ENCOUNTER — Ambulatory Visit: Payer: 59 | Admitting: Podiatry

## 2017-07-13 ENCOUNTER — Other Ambulatory Visit: Payer: Self-pay | Admitting: Podiatry

## 2017-07-13 ENCOUNTER — Ambulatory Visit (INDEPENDENT_AMBULATORY_CARE_PROVIDER_SITE_OTHER): Payer: 59 | Admitting: Family Medicine

## 2017-07-13 ENCOUNTER — Encounter: Payer: Self-pay | Admitting: Podiatry

## 2017-07-13 ENCOUNTER — Ambulatory Visit (INDEPENDENT_AMBULATORY_CARE_PROVIDER_SITE_OTHER): Payer: 59

## 2017-07-13 VITALS — BP 111/69 | HR 60 | Temp 97.9°F | Ht 65.0 in | Wt 193.0 lb

## 2017-07-13 VITALS — BP 153/96 | HR 69 | Resp 16

## 2017-07-13 DIAGNOSIS — Z6832 Body mass index (BMI) 32.0-32.9, adult: Secondary | ICD-10-CM

## 2017-07-13 DIAGNOSIS — E669 Obesity, unspecified: Secondary | ICD-10-CM

## 2017-07-13 DIAGNOSIS — F3289 Other specified depressive episodes: Secondary | ICD-10-CM | POA: Diagnosis not present

## 2017-07-13 DIAGNOSIS — Z9189 Other specified personal risk factors, not elsewhere classified: Secondary | ICD-10-CM | POA: Diagnosis not present

## 2017-07-13 DIAGNOSIS — M79672 Pain in left foot: Secondary | ICD-10-CM

## 2017-07-13 DIAGNOSIS — M779 Enthesopathy, unspecified: Secondary | ICD-10-CM

## 2017-07-13 MED ORDER — BUPROPION HCL ER (SR) 200 MG PO TB12: 200.0000 mg | ORAL_TABLET | Freq: Every day | ORAL | 0 refills | Status: DC

## 2017-07-13 MED ORDER — TRIAMCINOLONE ACETONIDE 10 MG/ML IJ SUSP
10.0000 mg | Freq: Once | INTRAMUSCULAR | Status: AC
  Administered 2017-07-13: 10 mg

## 2017-07-13 MED FILL — BUPROPION HCL SR 200 MG TAB: 200 | 30 days supply | Qty: 30 | Fill #0

## 2017-07-13 NOTE — Progress Notes (Signed)
Office: [phone]  /  Fax: [phone]   HPI:   Chief Complaint: OBESITY Brenda Russo is here to discuss her progress with her obesity treatment plan. She is on the keep a food journal with 1400 calories and 80 grams of protein daily and is following her eating plan approximately 20 % of the time. She states she is doing water therapy for 60 minutes 1 time per week. Brenda Russo has had increased emotional eating with the recent death of her mother-in-law. She has increased snacking, but is ready to get back on track. Her weight is 193 lb (87.5 kg) today and has had a weight gain of 1 pound over a period of 3 weeks since her last visit. She has lost 7 lbs since starting treatment with [name].  Depression with emotional eating behaviors Brenda Russo is missing doses of Wellbutrin lately, and is worried about her blood pressure increasing at home, but not here in the office. Brenda Russo struggles with emotional eating and using food for comfort to the extent that it is negatively impacting her health. She often snacks when she is not hungry. Brenda Russo sometimes feels she is out of control and then feels guilty that she made poor food choices. She has been working on behavior modification techniques to help reduce her emotional eating and has been somewhat successful. She shows no sign of suicidal or homicidal ideations.  At risk for cardiovascular disease Brenda Russo is at a higher than average risk for cardiovascular disease due to obesity. She currently denies any chest pain.  Depression screen [facility] 2/9 05/17/2016 04/12/2016 12/22/2015 11/21/2015 11/18/2015  Decreased Interest 0 0 0 0 0  Down, Depressed, Hopeless 1 0 0 0 0  PHQ - 2 Score 1 0 0 0 0  Altered sleeping 2 - - - -  Tired, decreased energy 1 - - - -  Change in appetite 2 - - - -  Feeling bad or failure about yourself  0 - - - -  Trouble concentrating 2 - - - -  Moving slowly or fidgety/restless 0 - - - -  Suicidal thoughts 0 - - - -  PHQ-9 Score 8  - - - -     ALLERGIES: Allergies  Allergen Reactions  . Eggs Or Egg-Derived Products     Shortness of breath, wheezing    MEDICATIONS: Current Outpatient Medications on File Prior to Visit  Medication Sig Dispense Refill  . albuterol (VENTOLIN HFA) 108 (90 Base) MCG/ACT inhaler Inhale 1-2 puffs into the lungs every 4 (four) hours as needed for wheezing or shortness of breath. 54 g 1  . amLODipine (NORVASC) 5 MG tablet Take 1 tablet (5 mg total) by mouth daily. 90 tablet 3  . Ascorbic Acid (VITAMIN C) 1000 MG tablet Take 1,000 mg by mouth daily.    . Coenzyme Q10 (COQ10) 100 MG CAPS Take 1 capsule by mouth daily.    . Ergocalciferol 2500 units CAPS Take 5,000 Units by mouth daily. 30 capsule 0  . fluticasone (FLONASE) 50 MCG/ACT nasal spray Place 2 sprays into both nostrils daily. 16 g 5  . fluticasone furoate-vilanterol (BREO ELLIPTA) 200-25 MCG/INH AEPB Inhale 1 puff into the lungs daily. 180 each 3  . losartan-hydrochlorothiazide (HYZAAR) 100-12.5 MG tablet Take 1 tablet by mouth daily. 90 tablet 2  . magnesium oxide (MAG-OX) 400 MG tablet 2 tablets daily. 400-800 mg daily    . Multiple Minerals-Vitamins (CALCIUM CITRATE PLUS/MAGNESIUM) TABS Take 1 tablet by mouth.    . Multiple Vitamins-Minerals (COMPLETE  WOMENS PO) Take 1 capsule by mouth 2 (two) times daily.      . Omega-3 Fatty Acids (FISH OIL) 1000 MG CAPS Take 1 capsule by mouth.     No current facility-administered medications on file prior to visit.     PAST MEDICAL HISTORY: Past Medical History:  Diagnosis Date  . Allergic rhinitis   . Anemia   . Anxiety   . Asthma   . HTN (hypertension)   . Hyperlipidemia    borderline  . Obese   . OSA (obstructive sleep apnea)    mild ,no CPAP  . Overweight(278.02)   . Plantar wart    history  . Prediabetes   . Shortness of breath     PAST SURGICAL HISTORY: Past Surgical History:  Procedure Laterality Date  . BREAST BIOPSY Right 09/02/2015  . CESAREAN SECTION    .  TUBAL LIGATION      SOCIAL HISTORY: Social History   Tobacco Use  . Smoking status: Never Smoker  . Smokeless tobacco: Never Used  Substance Use Topics  . Alcohol use: No  . Drug use: No    FAMILY HISTORY: Family History  Problem Relation Age of Onset  . Heart disease Mother        pacemaker   . Hypertension Mother   . Hyperlipidemia Mother   . Obesity Mother   . Asthma Father   . Pancreatic cancer Brother   . Breast cancer Maternal Aunt   . Colon cancer Neg Hx     ROS: Review of Systems  Constitutional: Negative for weight loss.  Cardiovascular: Negative for chest pain.  Psychiatric/Behavioral: Positive for depression. Negative for suicidal ideas.    PHYSICAL EXAM: Blood pressure 111/69, pulse 60, temperature 97.9 F (36.6 C), temperature source Oral, height 5\' 5"  (1.651 m), weight 193 lb (87.5 kg), SpO2 97 %. Body mass index is 32.12 kg/m. Physical Exam  Constitutional: She is oriented to person, place, and time. She appears well-developed and well-nourished.  Cardiovascular: Normal rate.  Pulmonary/Chest: Effort normal.  Musculoskeletal: Normal range of motion.  Neurological: She is oriented to person, place, and time.  Skin: Skin is warm and dry.  Psychiatric: She has a normal mood and affect. Her behavior is normal.  Vitals reviewed.   RECENT LABS AND TESTS: BMET    Component Value Date/Time   NA 144 05/09/2017 0917   K 3.8 05/09/2017 0917   CL 101 05/09/2017 0917   CO2 26 05/09/2017 0917   GLUCOSE 96 05/09/2017 0917   GLUCOSE 100 (H) 06/03/2015 0747   BUN 19 05/09/2017 0917   CREATININE 0.82 05/09/2017 0917   CALCIUM 9.4 05/09/2017 0917   GFRNONAA 78 05/09/2017 0917   GFRAA 90 05/09/2017 0917   Lab Results  Component Value Date   HGBA1C 5.6 05/09/2017   HGBA1C 5.6 12/06/2016   HGBA1C 5.6 05/17/2016   HGBA1C 5.9 09/22/2015   HGBA1C 6.2 06/03/2015   Lab Results  Component Value Date   INSULIN 6.1 05/09/2017   INSULIN 11.2 12/06/2016    INSULIN 9.8 05/17/2016   CBC    Component Value Date/Time   WBC 6.3 05/09/2017 0917   WBC 6.5 11/13/2012 1003   RBC 4.73 05/09/2017 0917   RBC 5.03 11/13/2012 1003   HGB 14.0 05/09/2017 0917   HCT 42.8 05/09/2017 0917   PLT 315 05/09/2017 0917   MCV 91 05/09/2017 0917   MCH 29.6 05/09/2017 0917   MCH 26.9 (A) 05/20/2012 1404   MCHC 32.7 05/09/2017  0917   MCHC 33.5 11/13/2012 1003   RDW 13.3 05/09/2017 0917   LYMPHSABS 1.8 05/09/2017 0917   MONOABS 0.4 11/13/2012 1003   EOSABS 0.1 05/09/2017 0917   BASOSABS 0.0 05/09/2017 0917   Iron/TIBC/Ferritin/ %Sat    Component Value Date/Time   IRON 88 05/20/2012 1422   TIBC 303 05/20/2012 1422   FERRITIN 80 05/20/2012 1422   IRONPCTSAT 29 05/20/2012 1422   Lipid Panel     Component Value Date/Time   CHOL 181 05/09/2017 0917   TRIG 39 05/09/2017 0917   HDL 67 05/09/2017 0917   CHOLHDL 2.7 05/09/2017 0917   CHOLHDL 3 06/03/2015 0747   VLDL 9.0 06/03/2015 0747   LDLCALC 106 (H) 05/09/2017 0917   Hepatic Function Panel     Component Value Date/Time   PROT 6.9 05/09/2017 0917   ALBUMIN 4.0 05/09/2017 0917   AST 22 05/09/2017 0917   ALT 22 05/09/2017 0917   ALKPHOS 94 05/09/2017 0917   BILITOT 0.5 05/09/2017 0917   BILIDIR 0.1 11/13/2012 1003      Component Value Date/Time   TSH 2.460 05/09/2017 0917   TSH 1.770 05/17/2016 0949   TSH 1.23 11/13/2012 1003   Results for SUMI, LYE (MRN [id number]) as of 07/13/2017 12:20  Ref. Range 05/09/2017 09:17  Vitamin D, 25-Hydroxy Latest Ref Range: 30.0 - 100.0 ng/mL 47.2   ASSESSMENT AND PLAN: Other depression - with emotional eating - Plan: buPROPion (WELLBUTRIN SR) 200 MG 12 hr tablet  At risk for heart disease  Class 1 obesity with serious comorbidity and body mass index (BMI) of 32.0 to 32.9 in adult, unspecified obesity type  PLAN:  Depression with Emotional Eating Behaviors We discussed behavior modification techniques today to help Antonique deal with her  emotional eating and depression. She has agreed to discontinue Wellbutrin SR 100 mg 2 times daily, and start Wellbutrin SR 200 mg qAM #30 with no refills and check blood pressure with appropriate sized cuff. [employer] agreed to follow up in 3 weeks.  Cardiovascular risk counseling Jordyne was given extended (15 minutes) coronary artery disease prevention counseling today. She is 62 y.o. female and has risk factors for heart disease including obesity. We discussed intensive lifestyle modifications today with an emphasis on specific weight loss instructions and strategies. Pt was also informed of the importance of increasing exercise and decreasing saturated fats to help prevent heart disease.  Obesity Malayla is currently in the action stage of change. As such, her goal is to continue with weight loss efforts She has agreed to keep a food journal with 1400 calories and 80 grams of protein daily Ahmoni has been instructed to work up to a goal of 150 minutes of combined cardio and strengthening exercise per week for weight loss and overall health benefits. We discussed the following Behavioral Modification Strategies today: celebration eating strategies, increasing lean protein intake, dealing with family or coworker sabotage and emotional eating strategies  Chevi has agreed to follow up with our clinic in 3 weeks. She was informed of the importance of frequent follow up visits to maximize her success with intensive lifestyle modifications for her multiple health conditions.  I, Doreene Nest, am acting as transcriptionist for Dennard Nip, MD  I have reviewed the above documentation for accuracy and completeness, and I agree with the above. -Dennard Nip, MD

## 2017-07-13 NOTE — Progress Notes (Signed)
   Subjective:    Patient ID: Brenda Russo, female    DOB: April 07, 1955, 62 y.o.   MRN: [id number]  HPI    Review of Systems  All other systems reviewed and are negative.      Objective:   Physical Exam        Assessment & Plan:

## 2017-07-16 NOTE — Progress Notes (Signed)
Subjective:   Patient ID: Brenda Russo, female   DOB: 62 y.o.   MRN: [id number]   HPI Patient presents stating she went to a wedding around a month ago and started to develop a lot of pain on top of her left foot that is not gotten better.  Patient states she is tried ice therapy reduced activity and patient does not smoke and likes to be active   Review of Systems  All other systems reviewed and are negative.       Objective:  Physical Exam  Constitutional: She appears well-developed and well-nourished.  Cardiovascular: Intact distal pulses.  Pulmonary/Chest: Effort normal.  Musculoskeletal: Normal range of motion.  Neurological: She is alert.  Skin: Skin is warm.  Nursing note and vitals reviewed.   Neurovascular status was found to be intact muscle strength was adequate range of motion within normal limits with patient found to have exquisite discomfort on the dorsal lateral aspect of the left foot within the tendon complex with no indication of muscle dysfunction.  Patient is found to have good digital perfusion and is well oriented x3     Assessment:  Inflammatory tendinitis of the dorsal lateral aspect left foot with fluid buildup and pain     Plan:  H&P conditions reviewed and today I went ahead and discussed x-rays.  I injected the dorsal lateral complex 3 mg Kenalog 5 mg Xylocaine advised on heat ice therapy and dispense fascial brace to lift up the lateral side of the foot.  Instructed on good support  X-ray indicates that there is no signs of stress fracture or advanced arthritis currently

## 2017-07-20 ENCOUNTER — Telehealth: Payer: Self-pay | Admitting: Podiatry

## 2017-07-20 MED ORDER — MELOXICAM 7.5 MG PO TABS: 7.5000 mg | ORAL_TABLET | Freq: Every day | ORAL | 0 refills | Status: DC

## 2017-07-20 MED FILL — MELOXICAM 7.5 MG TABLET: 7.5 | 30 days supply | Qty: 30 | Fill #0

## 2017-07-20 NOTE — Addendum Note (Signed)
Addended by: Harriett Sine D on: 07/20/2017 11:05 AM   Modules accepted: Orders

## 2017-07-20 NOTE — Telephone Encounter (Signed)
I saw Dr. Paulla Dolly last Thursday, 06 June. I'm still having a lot of pain in my foot and I was just wondering if there was anything else I could do to maybe relieve some of the pain. My number is [phone] or you can call me on my cell [phone]. Thank you.

## 2017-07-20 NOTE — Telephone Encounter (Signed)
I informed pt of Dr. Mellody Drown instructions to apply heat and then ice therapy, and he ordered meloxicam 7.5mg  #30 one tablet daily. Pt states understanding.

## 2017-08-01 ENCOUNTER — Ambulatory Visit (INDEPENDENT_AMBULATORY_CARE_PROVIDER_SITE_OTHER): Payer: 59 | Admitting: Family Medicine

## 2017-08-01 VITALS — BP 126/76 | HR 52 | Temp 97.4°F | Ht 65.0 in | Wt 192.0 lb

## 2017-08-01 DIAGNOSIS — E669 Obesity, unspecified: Secondary | ICD-10-CM | POA: Diagnosis not present

## 2017-08-01 DIAGNOSIS — Z6832 Body mass index (BMI) 32.0-32.9, adult: Secondary | ICD-10-CM | POA: Diagnosis not present

## 2017-08-01 DIAGNOSIS — I10 Essential (primary) hypertension: Secondary | ICD-10-CM

## 2017-08-01 NOTE — Progress Notes (Signed)
Office: [phone]  /  Fax: [phone]   HPI:   Chief Complaint: OBESITY Brenda Russo is here to discuss her progress with her obesity treatment plan. She is on the keep a food journal with 1400 calories and 80 grams of protein daily and is following her eating plan approximately 25 % of the time. She states she is exercising 0 minutes 0 times per week. Brenda Russo has had lots of challenges in the last month and not concentrating on weight loss. She is going on vacation soon and wants advice on how to eat better in [place].  Her weight is 192 lb (87.1 kg) today and has had a weight loss of 1 pound over a period of 2 to 3 weeks since her last visit. She has lost 8 lbs since starting treatment with [name].  Hypertension Brenda Russo is a 62 y.o. female with hypertension. [name]'s blood pressure improved from last visit, she is stable on medications. She denies chest pain, and she is working on diet prescription and weight loss to help control her blood pressure with the goal of decreasing her risk of heart attack and stroke. [name]'s blood pressure is currently controlled.  ALLERGIES: Allergies  Allergen Reactions  . Eggs Or Egg-Derived Products     Shortness of breath, wheezing    MEDICATIONS: Current Outpatient Medications on File Prior to Visit  Medication Sig Dispense Refill  . albuterol (VENTOLIN HFA) 108 (90 Base) MCG/ACT inhaler Inhale 1-2 puffs into the lungs every 4 (four) hours as needed for wheezing or shortness of breath. 54 g 1  . amLODipine (NORVASC) 5 MG tablet Take 1 tablet (5 mg total) by mouth daily. 90 tablet 3  . Ascorbic Acid (VITAMIN C) 1000 MG tablet Take 1,000 mg by mouth daily.    Marland Kitchen buPROPion (WELLBUTRIN SR) 200 MG 12 hr tablet Take 1 tablet (200 mg total) by mouth daily at 12 noon. 30 tablet 0  . Coenzyme Q10 (COQ10) 100 MG CAPS Take 1 capsule by mouth daily.    . Ergocalciferol 2500 units CAPS Take 5,000 Units by mouth daily. 30 capsule 0  . fluticasone  (FLONASE) 50 MCG/ACT nasal spray Place 2 sprays into both nostrils daily. 16 g 5  . fluticasone furoate-vilanterol (BREO ELLIPTA) 200-25 MCG/INH AEPB Inhale 1 puff into the lungs daily. 180 each 3  . losartan-hydrochlorothiazide (HYZAAR) 100-12.5 MG tablet Take 1 tablet by mouth daily. 90 tablet 2  . magnesium oxide (MAG-OX) 400 MG tablet 2 tablets daily. 400-800 mg daily    . meloxicam (MOBIC) 7.5 MG tablet Take 1 tablet (7.5 mg total) by mouth daily. 30 tablet 0  . Multiple Minerals-Vitamins (CALCIUM CITRATE PLUS/MAGNESIUM) TABS Take 1 tablet by mouth.    . Multiple Vitamins-Minerals (COMPLETE WOMENS PO) Take 1 capsule by mouth 2 (two) times daily.      . Omega-3 Fatty Acids (FISH OIL) 1000 MG CAPS Take 1 capsule by mouth.     No current facility-administered medications on file prior to visit.     PAST MEDICAL HISTORY: Past Medical History:  Diagnosis Date  . Allergic rhinitis   . Anemia   . Anxiety   . Asthma   . HTN (hypertension)   . Hyperlipidemia    borderline  . Obese   . OSA (obstructive sleep apnea)    mild ,no CPAP  . Overweight(278.02)   . Plantar wart    history  . Prediabetes   . Shortness of breath     PAST SURGICAL HISTORY:  Past Surgical History:  Procedure Laterality Date  . BREAST BIOPSY Right 09/02/2015  . CESAREAN SECTION    . TUBAL LIGATION      SOCIAL HISTORY: Social History   Tobacco Use  . Smoking status: Never Smoker  . Smokeless tobacco: Never Used  Substance Use Topics  . Alcohol use: No  . Drug use: No    FAMILY HISTORY: Family History  Problem Relation Age of Onset  . Heart disease Mother        pacemaker   . Hypertension Mother   . Hyperlipidemia Mother   . Obesity Mother   . Asthma Father   . Pancreatic cancer Brother   . Breast cancer Maternal Aunt   . Colon cancer Neg Hx     ROS: Review of Systems  Constitutional: Positive for weight loss.  Cardiovascular: Negative for chest pain.    PHYSICAL EXAM: Blood  pressure 126/76, pulse (!) 52, temperature (!) 97.4 F (36.3 C), temperature source Oral, height 5\' 5"  (1.651 m), weight 192 lb (87.1 kg), SpO2 98 %. Body mass index is 31.95 kg/m. Physical Exam  Constitutional: She is oriented to person, place, and time. She appears well-developed and well-nourished.  Cardiovascular: Normal rate.  Pulmonary/Chest: Effort normal.  Musculoskeletal: Normal range of motion.  Neurological: She is oriented to person, place, and time.  Skin: Skin is warm and dry.  Psychiatric: She has a normal mood and affect. Her behavior is normal.  Vitals reviewed.   RECENT LABS AND TESTS: BMET    Component Value Date/Time   NA 144 05/09/2017 0917   K 3.8 05/09/2017 0917   CL 101 05/09/2017 0917   CO2 26 05/09/2017 0917   GLUCOSE 96 05/09/2017 0917   GLUCOSE 100 (H) 06/03/2015 0747   BUN 19 05/09/2017 0917   CREATININE 0.82 05/09/2017 0917   CALCIUM 9.4 05/09/2017 0917   GFRNONAA 78 05/09/2017 0917   GFRAA 90 05/09/2017 0917   Lab Results  Component Value Date   HGBA1C 5.6 05/09/2017   HGBA1C 5.6 12/06/2016   HGBA1C 5.6 05/17/2016   HGBA1C 5.9 09/22/2015   HGBA1C 6.2 06/03/2015   Lab Results  Component Value Date   INSULIN 6.1 05/09/2017   INSULIN 11.2 12/06/2016   INSULIN 9.8 05/17/2016   CBC    Component Value Date/Time   WBC 6.3 05/09/2017 0917   WBC 6.5 11/13/2012 1003   RBC 4.73 05/09/2017 0917   RBC 5.03 11/13/2012 1003   HGB 14.0 05/09/2017 0917   HCT 42.8 05/09/2017 0917   PLT 315 05/09/2017 0917   MCV 91 05/09/2017 0917   MCH 29.6 05/09/2017 0917   MCH 26.9 (A) 05/20/2012 1404   MCHC 32.7 05/09/2017 0917   MCHC 33.5 11/13/2012 1003   RDW 13.3 05/09/2017 0917   LYMPHSABS 1.8 05/09/2017 0917   MONOABS 0.4 11/13/2012 1003   EOSABS 0.1 05/09/2017 0917   BASOSABS 0.0 05/09/2017 0917   Iron/TIBC/Ferritin/ %Sat    Component Value Date/Time   IRON 88 05/20/2012 1422   TIBC 303 05/20/2012 1422   FERRITIN 80 05/20/2012 1422    IRONPCTSAT 29 05/20/2012 1422   Lipid Panel     Component Value Date/Time   CHOL 181 05/09/2017 0917   TRIG 39 05/09/2017 0917   HDL 67 05/09/2017 0917   CHOLHDL 2.7 05/09/2017 0917   CHOLHDL 3 06/03/2015 0747   VLDL 9.0 06/03/2015 0747   LDLCALC 106 (H) 05/09/2017 0917   Hepatic Function Panel     Component Value  Date/Time   PROT 6.9 05/09/2017 0917   ALBUMIN 4.0 05/09/2017 0917   AST 22 05/09/2017 0917   ALT 22 05/09/2017 0917   ALKPHOS 94 05/09/2017 0917   BILITOT 0.5 05/09/2017 0917   BILIDIR 0.1 11/13/2012 1003      Component Value Date/Time   TSH 2.460 05/09/2017 0917   TSH 1.770 05/17/2016 0949   TSH 1.23 11/13/2012 1003    ASSESSMENT AND PLAN: Essential hypertension  Class 1 obesity with serious comorbidity and body mass index (BMI) of 32.0 to 32.9 in adult, unspecified obesity type  PLAN:  Hypertension We discussed sodium restriction, working on healthy weight loss, and a regular exercise program as the means to achieve improved blood pressure control. Faylene agreed with this plan and agreed to follow up as directed. We will continue to monitor her blood pressure as well as her progress with the above lifestyle modifications. She will continue her medications, diet, exercise, and weight loss, and will watch for signs of hypotension as she continues her lifestyle modifications. Jaton agrees to follow up with our clinic in 3 weeks.  We spent > than 50% of the 15 minute visit on the counseling as documented in the note.  Obesity Novelle is currently in the action stage of change. As such, her goal is to continue with weight loss efforts She has agreed to keep a food journal with 1200-1400 calories and 80+ grams of protein daily Shajuana has been instructed to work up to a goal of 150 minutes of combined cardio and strengthening exercise per week for weight loss and overall health benefits. We discussed the following Behavioral Modification Strategies today:  increasing lean protein intake, decreasing simple carbohydrates, work on meal planning and easy cooking plans, travel eating strategies, celebration eating strategies, and no skipping meals   Lylian has agreed to follow up with our clinic in 3 weeks. She was informed of the importance of frequent follow up visits to maximize her success with intensive lifestyle modifications for her multiple health conditions.   I, Trixie Dredge, am acting as transcriptionist for Dennard Nip, MD  I have reviewed the above documentation for accuracy and completeness, and I agree with the above. -Dennard Nip, MD

## 2017-08-15 IMAGING — DX DG CHEST 2V
2 series · 2 of 2 positions shown · non-contrast
Comparison: Radiographs December 03, 2010.

CLINICAL DATA: Shortness of breath.  Congestion.

EXAM:
CHEST  2 VIEW

[chest pa]
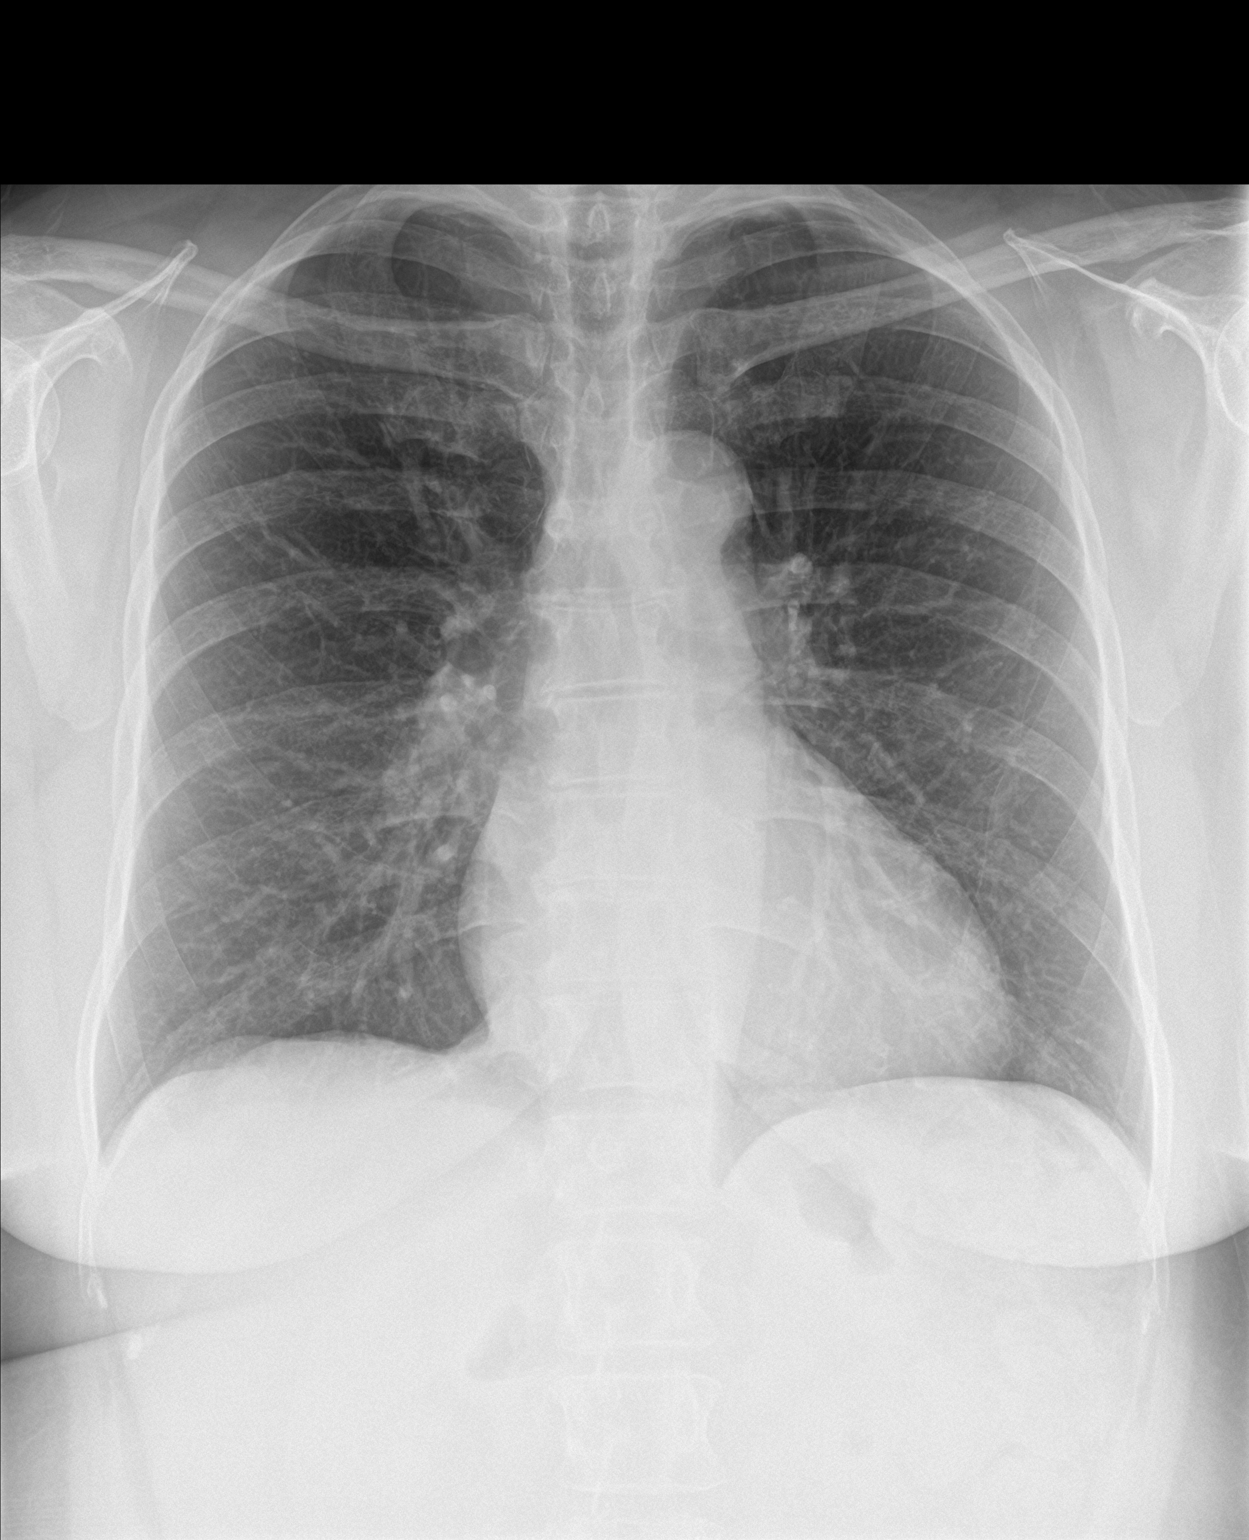

[chest lat]
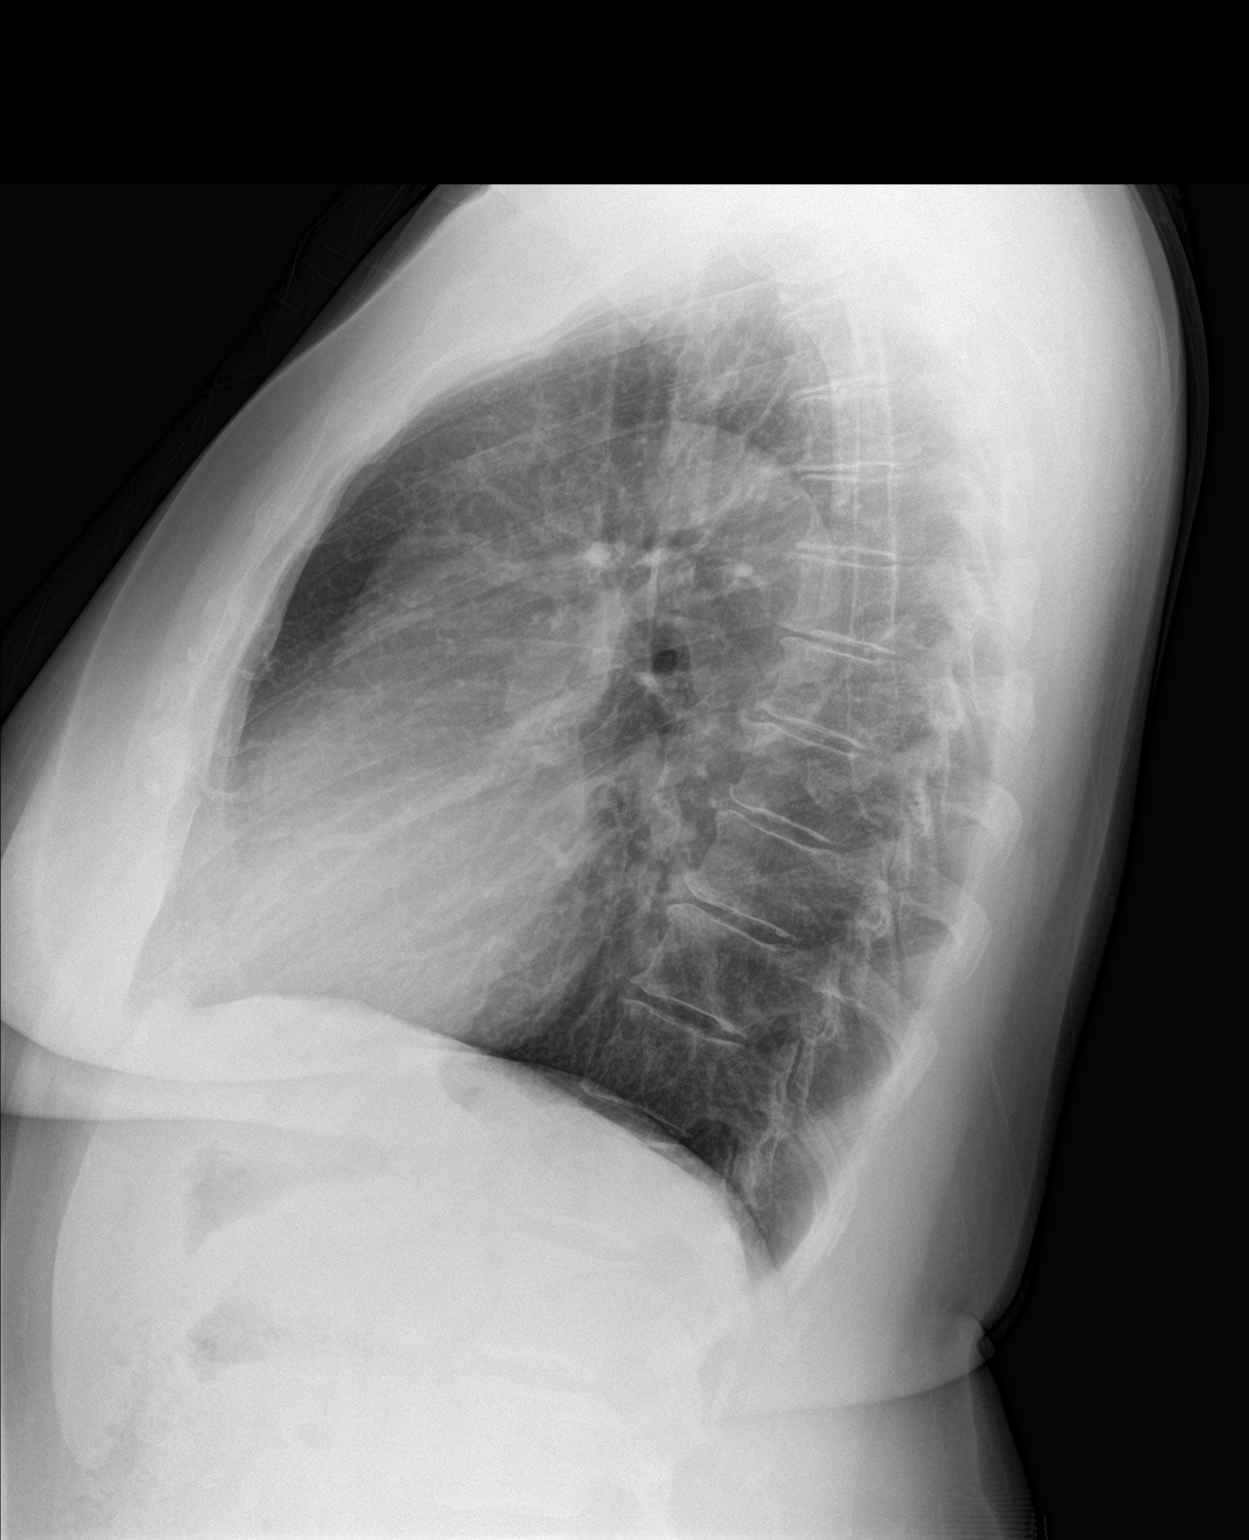

[2 of 2 positions shown; findings below may reference images not displayed]

FINDINGS: The heart size and mediastinal contours are within normal limits.
Both lungs are clear. No pneumothorax or pleural effusion is noted.
The visualized skeletal structures are unremarkable.
IMPRESSION: No active cardiopulmonary disease.

## 2017-08-23 ENCOUNTER — Ambulatory Visit (INDEPENDENT_AMBULATORY_CARE_PROVIDER_SITE_OTHER): Payer: 59 | Admitting: Family Medicine

## 2017-08-23 VITALS — BP 118/71 | HR 61 | Temp 97.6°F | Ht 65.0 in | Wt 195.0 lb

## 2017-08-23 DIAGNOSIS — E559 Vitamin D deficiency, unspecified: Secondary | ICD-10-CM | POA: Diagnosis not present

## 2017-08-23 DIAGNOSIS — E669 Obesity, unspecified: Secondary | ICD-10-CM | POA: Diagnosis not present

## 2017-08-23 DIAGNOSIS — F3289 Other specified depressive episodes: Secondary | ICD-10-CM

## 2017-08-23 DIAGNOSIS — E66811 Obesity, class 1: Secondary | ICD-10-CM

## 2017-08-23 DIAGNOSIS — Z6832 Body mass index (BMI) 32.0-32.9, adult: Secondary | ICD-10-CM

## 2017-08-23 NOTE — Progress Notes (Signed)
Office: [phone]  /  Fax: [phone]   HPI:   Chief Complaint: OBESITY Brenda Russo is here to discuss her progress with her obesity treatment plan. She is on the keep a food journal with 1200-1400 calories and 80+ grams of protein daily and is following her eating plan approximately 40 % of the time. She states she did a lot of walking on vacation. Brenda Russo was on vacation and had some celebration eating but was still trying to be mindful. She hasn't journaled much and states she is ready to get back on track.  Her weight is 195 lb (88.5 kg) today and has gained 3 pounds since her last visit. She has lost 5 lbs since starting treatment with [name].  Vitamin D Deficiency Brenda Russo has a diagnosis of vitamin D deficiency. She is on Vit D, she notes fatigue is improving and denies nausea, vomiting or muscle weakness.  Depression with emotional eating behaviors Brenda Russo is not taking Wellbutrin regularly only approximately 50 % of the time. She notes decreased motivation and increased emotional eating. Brenda Russo struggles with emotional eating and using food for comfort to the extent that it is negatively impacting her health. She often snacks when she is not hungry. Brenda Russo sometimes feels she is out of control and then feels guilty that she made poor food choices. She has been working on behavior modification techniques to help reduce her emotional eating and has been somewhat successful. She shows no sign of suicidal or homicidal ideations.  Depression screen Brenda Russo 2/9 05/17/2016 04/12/2016 12/22/2015 11/21/2015 11/18/2015  Decreased Interest 0 0 0 0 0  Down, Depressed, Hopeless 1 0 0 0 0  PHQ - 2 Score 1 0 0 0 0  Altered sleeping 2 - - - -  Tired, decreased energy 1 - - - -  Change in appetite 2 - - - -  Feeling bad or failure about yourself  0 - - - -  Trouble concentrating 2 - - - -  Moving slowly or fidgety/restless 0 - - - -  Suicidal thoughts 0 - - - -  PHQ-9 Score 8 - - - -     ALLERGIES: Allergies  Allergen Reactions  . Eggs Or Egg-Derived Products     Shortness of breath, wheezing    MEDICATIONS: Current Outpatient Medications on File Prior to Visit  Medication Sig Dispense Refill  . albuterol (VENTOLIN HFA) 108 (90 Base) MCG/ACT inhaler Inhale 1-2 puffs into the lungs every 4 (four) hours as needed for wheezing or shortness of breath. 54 g 1  . amLODipine (NORVASC) 5 MG tablet Take 1 tablet (5 mg total) by mouth daily. 90 tablet 3  . Ascorbic Acid (VITAMIN C) 1000 MG tablet Take 1,000 mg by mouth daily.    Brenda Russo Kitchen buPROPion (WELLBUTRIN SR) 200 MG 12 hr tablet Take 1 tablet (200 mg total) by mouth daily at 12 noon. 30 tablet 0  . Coenzyme Q10 (COQ10) 100 MG CAPS Take 1 capsule by mouth daily.    . Ergocalciferol 2500 units CAPS Take 5,000 Units by mouth daily. 30 capsule 0  . fluticasone (FLONASE) 50 MCG/ACT nasal spray Place 2 sprays into both nostrils daily. 16 g 5  . fluticasone furoate-vilanterol (BREO ELLIPTA) 200-25 MCG/INH AEPB Inhale 1 puff into the lungs daily. 180 each 3  . losartan-hydrochlorothiazide (HYZAAR) 100-12.5 MG tablet Take 1 tablet by mouth daily. 90 tablet 2  . magnesium oxide (MAG-OX) 400 MG tablet 2 tablets daily. 400-800 mg daily    . meloxicam (  MOBIC) 7.5 MG tablet Take 1 tablet (7.5 mg total) by mouth daily. 30 tablet 0  . Multiple Minerals-Vitamins (CALCIUM CITRATE PLUS/MAGNESIUM) TABS Take 1 tablet by mouth.    . Multiple Vitamins-Minerals (COMPLETE WOMENS PO) Take 1 capsule by mouth 2 (two) times daily.      . Omega-3 Fatty Acids (FISH OIL) 1000 MG CAPS Take 1 capsule by mouth.     No current facility-administered medications on file prior to visit.     PAST MEDICAL HISTORY: Past Medical History:  Diagnosis Date  . Allergic rhinitis   . Anemia   . Anxiety   . Asthma   . HTN (hypertension)   . Hyperlipidemia    borderline  . Obese   . OSA (obstructive sleep apnea)    mild ,no CPAP  . Overweight(278.02)   . Plantar  wart    history  . Prediabetes   . Shortness of breath     PAST SURGICAL HISTORY: Past Surgical History:  Procedure Laterality Date  . BREAST BIOPSY Right 09/02/2015  . CESAREAN SECTION    . TUBAL LIGATION      SOCIAL HISTORY: Social History   Tobacco Use  . Smoking status: Never Smoker  . Smokeless tobacco: Never Used  Substance Use Topics  . Alcohol use: No  . Drug use: No    FAMILY HISTORY: Family History  Problem Relation Age of Onset  . Heart disease Mother        pacemaker   . Hypertension Mother   . Hyperlipidemia Mother   . Obesity Mother   . Asthma Father   . Pancreatic cancer Brother   . Breast cancer Maternal Aunt   . Colon cancer Neg Hx     ROS: Review of Systems  Constitutional: Positive for malaise/fatigue. Negative for weight loss.  Gastrointestinal: Negative for nausea and vomiting.  Musculoskeletal:       Negative muscle weakness  Psychiatric/Behavioral: Positive for depression. Negative for suicidal ideas.    PHYSICAL EXAM: Blood pressure 118/71, pulse 61, temperature 97.6 F (36.4 C), temperature source Oral, height 5\' 5"  (1.651 m), weight 195 lb (88.5 kg), SpO2 98 %. Body mass index is 32.45 kg/m. Physical Exam  Constitutional: She is oriented to person, place, and time. She appears well-developed and well-nourished.  Cardiovascular: Normal rate.  Pulmonary/Chest: Effort normal.  Musculoskeletal: Normal range of motion.  Neurological: She is oriented to person, place, and time.  Skin: Skin is warm and dry.  Psychiatric: She has a normal mood and affect. Her behavior is normal.  Vitals reviewed.   RECENT LABS AND TESTS: BMET    Component Value Date/Time   NA 144 05/09/2017 0917   K 3.8 05/09/2017 0917   CL 101 05/09/2017 0917   CO2 26 05/09/2017 0917   GLUCOSE 96 05/09/2017 0917   GLUCOSE 100 (H) 06/03/2015 0747   BUN 19 05/09/2017 0917   CREATININE 0.82 05/09/2017 0917   CALCIUM 9.4 05/09/2017 0917   GFRNONAA 78  05/09/2017 0917   GFRAA 90 05/09/2017 0917   Lab Results  Component Value Date   HGBA1C 5.6 05/09/2017   HGBA1C 5.6 12/06/2016   HGBA1C 5.6 05/17/2016   HGBA1C 5.9 09/22/2015   HGBA1C 6.2 06/03/2015   Lab Results  Component Value Date   INSULIN 6.1 05/09/2017   INSULIN 11.2 12/06/2016   INSULIN 9.8 05/17/2016   CBC    Component Value Date/Time   WBC 6.3 05/09/2017 0917   WBC 6.5 11/13/2012 1003   RBC  4.73 05/09/2017 0917   RBC 5.03 11/13/2012 1003   HGB 14.0 05/09/2017 0917   HCT 42.8 05/09/2017 0917   PLT 315 05/09/2017 0917   MCV 91 05/09/2017 0917   MCH 29.6 05/09/2017 0917   MCH 26.9 (A) 05/20/2012 1404   MCHC 32.7 05/09/2017 0917   MCHC 33.5 11/13/2012 1003   RDW 13.3 05/09/2017 0917   LYMPHSABS 1.8 05/09/2017 0917   MONOABS 0.4 11/13/2012 1003   EOSABS 0.1 05/09/2017 0917   BASOSABS 0.0 05/09/2017 0917   Iron/TIBC/Ferritin/ %Sat    Component Value Date/Time   IRON 88 05/20/2012 1422   TIBC 303 05/20/2012 1422   FERRITIN 80 05/20/2012 1422   IRONPCTSAT 29 05/20/2012 1422   Lipid Panel     Component Value Date/Time   CHOL 181 05/09/2017 0917   TRIG 39 05/09/2017 0917   HDL 67 05/09/2017 0917   CHOLHDL 2.7 05/09/2017 0917   CHOLHDL 3 06/03/2015 0747   VLDL 9.0 06/03/2015 0747   LDLCALC 106 (H) 05/09/2017 0917   Hepatic Function Panel     Component Value Date/Time   PROT 6.9 05/09/2017 0917   ALBUMIN 4.0 05/09/2017 0917   AST 22 05/09/2017 0917   ALT 22 05/09/2017 0917   ALKPHOS 94 05/09/2017 0917   BILITOT 0.5 05/09/2017 0917   BILIDIR 0.1 11/13/2012 1003      Component Value Date/Time   TSH 2.460 05/09/2017 0917   TSH 1.770 05/17/2016 0949   TSH 1.23 11/13/2012 1003  Results for RYIAH, BELLISSIMO (MRN [id number]) as of 08/23/2017 16:13  Ref. Range 05/09/2017 09:17  Vitamin D, 25-Hydroxy Latest Ref Range: 30.0 - 100.0 ng/mL 47.2    ASSESSMENT AND PLAN: Vitamin D deficiency  Other depression - with emotional eating  Class 1 obesity  with serious comorbidity and body mass index (BMI) of 32.0 to 32.9 in adult, unspecified obesity type  PLAN:  Vitamin D Deficiency Brenda Russo was informed that low vitamin D levels contributes to fatigue and are associated with obesity, breast, and colon cancer. Brenda Russo agrees to continue taking Vit D and will follow up for routine testing of vitamin D, at least 2-3 times per year. She was informed of the risk of over-replacement of vitamin D and agrees to not increase her dose unless she discusses this with [name] first. Brenda Russo agrees to follow up with our clinic in 3 weeks and we will check labs at that time.  Depression with Emotional Eating Behaviors We discussed behavior modification techniques today to help Brenda Russo deal with her emotional eating and depression. Brenda Russo was encouraged to take Wellbutrin daily to get the most benefit. Brenda Russo agrees to follow up with our clinic in 3 weeks.  We spent > than 50% of the 30 minute visit on the counseling as documented in the note.  Obesity Brenda Russo is currently in the action stage of change. As such, her goal is to continue with weight loss efforts She has agreed to keep a food journal with 1200-1400 calories and 80+ grams of protein daily Brenda Russo has been instructed to work up to a goal of 150 minutes of combined cardio and strengthening exercise per week for weight loss and overall health benefits. We discussed the following Behavioral Modification Strategies today: increasing lean protein intake, work on meal planning and easy cooking plans, and no skipping meals   Brenda Russo has agreed to follow up with our clinic in 3 weeks. She was informed of the importance of frequent follow up visits to maximize her success  with intensive lifestyle modifications for her multiple health conditions.   I, Trixie Dredge, am acting as transcriptionist for Dennard Nip, MD  I have reviewed the above documentation for accuracy and completeness, and I agree  with the above. -Dennard Nip, MD

## 2017-09-11 ENCOUNTER — Ambulatory Visit (INDEPENDENT_AMBULATORY_CARE_PROVIDER_SITE_OTHER): Payer: 59 | Admitting: Family Medicine

## 2017-09-11 VITALS — BP 128/75 | HR 63 | Temp 97.9°F | Ht 65.0 in | Wt 194.0 lb

## 2017-09-11 DIAGNOSIS — E669 Obesity, unspecified: Secondary | ICD-10-CM | POA: Diagnosis not present

## 2017-09-11 DIAGNOSIS — Z6832 Body mass index (BMI) 32.0-32.9, adult: Secondary | ICD-10-CM | POA: Diagnosis not present

## 2017-09-11 DIAGNOSIS — F3289 Other specified depressive episodes: Secondary | ICD-10-CM | POA: Diagnosis not present

## 2017-09-11 DIAGNOSIS — I10 Essential (primary) hypertension: Secondary | ICD-10-CM | POA: Diagnosis not present

## 2017-09-11 MED ORDER — BUPROPION HCL ER (SR) 200 MG PO TB12: 200.0000 mg | ORAL_TABLET | Freq: Every day | ORAL | 0 refills | Status: DC

## 2017-09-11 MED ORDER — INSULIN PEN NEEDLE 32G X 4 MM MISC: 1.0000 | Freq: Two times a day (BID) | 0 refills | Status: DC

## 2017-09-11 MED ORDER — LIRAGLUTIDE -WEIGHT MANAGEMENT 18 MG/3ML ~~LOC~~ SOPN: 3.0000 mg | PEN_INJECTOR | Freq: Every day | SUBCUTANEOUS | 0 refills | Status: DC

## 2017-09-11 NOTE — Progress Notes (Signed)
Office: [phone]  /  Fax: [phone]   HPI:   Chief Complaint: OBESITY Brenda Russo is here to discuss her progress with her obesity treatment plan. She is on the keep a food journal with 1200-1400 calories and 80+ grams of protein daily and is following her eating plan approximately 50 % of the time. She states she is walking and working out with exercise tape for 80 minutes 1 time per week. Brenda Russo is doing well with weight loss even on vacation. She is working on [employer] but doesn't feel satisfied.  Her weight is 194 lb (88 kg) today and has had a weight loss of 1 pound over a period of 2 to 3 weeks since her last visit. She has lost 6 lbs since starting treatment with [name].  Hypertension Brenda Russo is a 62 y.o. female with hypertension. Brenda Russo's blood pressure is stable on medications and she denies chest pain or headache. She is working weight loss to help control her blood pressure with the goal of decreasing her risk of heart attack and stroke. Brenda Russo's blood pressure is currently controlled.  Depression with emotional eating behaviors Brenda Russo is stable on Wellbutrin, notes decreased emotional eating but still not always feeling satisfied with food, sometimes feeling deprived. Brenda Russo struggles with emotional eating and using food for comfort to the extent that it is negatively impacting her health. She often snacks when she is not hungry. Brenda Russo sometimes feels she is out of control and then feels guilty that she made poor food choices. She has been working on behavior modification techniques to help reduce her emotional eating and has been somewhat successful. She shows no sign of suicidal or homicidal ideations.  Depression screen [facility] 2/9 05/17/2016 04/12/2016 12/22/2015 11/21/2015 11/18/2015  Decreased Interest 0 0 0 0 0  Down, Depressed, Hopeless 1 0 0 0 0  PHQ - 2 Score 1 0 0 0 0  Altered sleeping 2 - - - -  Tired, decreased energy 1 - - - -  Change in  appetite 2 - - - -  Feeling bad or failure about yourself  0 - - - -  Trouble concentrating 2 - - - -  Moving slowly or fidgety/restless 0 - - - -  Suicidal thoughts 0 - - - -  PHQ-9 Score 8 - - - -    ALLERGIES: Allergies  Allergen Reactions  . Eggs Or Egg-Derived Products     Shortness of breath, wheezing    MEDICATIONS: Current Outpatient Medications on File Prior to Visit  Medication Sig Dispense Refill  . albuterol (VENTOLIN HFA) 108 (90 Base) MCG/ACT inhaler Inhale 1-2 puffs into the lungs every 4 (four) hours as needed for wheezing or shortness of breath. 54 g 1  . amLODipine (NORVASC) 5 MG tablet Take 1 tablet (5 mg total) by mouth daily. 90 tablet 3  . Ascorbic Acid (VITAMIN C) 1000 MG tablet Take 1,000 mg by mouth daily.    . Coenzyme Q10 (COQ10) 100 MG CAPS Take 1 capsule by mouth daily.    . Ergocalciferol 2500 units CAPS Take 5,000 Units by mouth daily. 30 capsule 0  . fluticasone (FLONASE) 50 MCG/ACT nasal spray Place 2 sprays into both nostrils daily. 16 g 5  . fluticasone furoate-vilanterol (BREO ELLIPTA) 200-25 MCG/INH AEPB Inhale 1 puff into the lungs daily. 180 each 3  . losartan-hydrochlorothiazide (HYZAAR) 100-12.5 MG tablet Take 1 tablet by mouth daily. 90 tablet 2  . magnesium oxide (MAG-OX) 400 MG tablet 2  tablets daily. 400-800 mg daily    . meloxicam (MOBIC) 7.5 MG tablet Take 1 tablet (7.5 mg total) by mouth daily. 30 tablet 0  . Multiple Minerals-Vitamins (CALCIUM CITRATE PLUS/MAGNESIUM) TABS Take 1 tablet by mouth.    . Multiple Vitamins-Minerals (COMPLETE WOMENS PO) Take 1 capsule by mouth 2 (two) times daily.      . Omega-3 Fatty Acids (FISH OIL) 1000 MG CAPS Take 1 capsule by mouth.     No current facility-administered medications on file prior to visit.     PAST MEDICAL HISTORY: Past Medical History:  Diagnosis Date  . Allergic rhinitis   . Anemia   . Anxiety   . Asthma   . HTN (hypertension)   . Hyperlipidemia    borderline  . Obese   .  OSA (obstructive sleep apnea)    mild ,no CPAP  . Overweight(278.02)   . Plantar wart    history  . Prediabetes   . Shortness of breath     PAST SURGICAL HISTORY: Past Surgical History:  Procedure Laterality Date  . BREAST BIOPSY Right 09/02/2015  . CESAREAN SECTION    . TUBAL LIGATION      SOCIAL HISTORY: Social History   Tobacco Use  . Smoking status: Never Smoker  . Smokeless tobacco: Never Used  Substance Use Topics  . Alcohol use: No  . Drug use: No    FAMILY HISTORY: Family History  Problem Relation Age of Onset  . Heart disease Mother        pacemaker   . Hypertension Mother   . Hyperlipidemia Mother   . Obesity Mother   . Asthma Father   . Pancreatic cancer Brother   . Breast cancer Maternal Aunt   . Colon cancer Neg Hx     ROS: Review of Systems  Constitutional: Positive for weight loss.  Cardiovascular: Negative for chest pain.  Neurological: Negative for headaches.  Psychiatric/Behavioral: Positive for depression. Negative for suicidal ideas.    PHYSICAL EXAM: Blood pressure 128/75, pulse 63, temperature 97.9 F (36.6 C), temperature source Oral, height 5\' 5"  (1.651 m), weight 194 lb (88 kg), SpO2 97 %. Body mass index is 32.28 kg/m. Physical Exam  Constitutional: She is oriented to person, place, and time. She appears well-developed and well-nourished.  Cardiovascular: Normal rate.  Pulmonary/Chest: Effort normal.  Musculoskeletal: Normal range of motion.  Neurological: She is oriented to person, place, and time.  Skin: Skin is warm and dry.  Psychiatric: She has a normal mood and affect. Her behavior is normal.  Vitals reviewed.   RECENT LABS AND TESTS: BMET    Component Value Date/Time   NA 144 05/09/2017 0917   K 3.8 05/09/2017 0917   CL 101 05/09/2017 0917   CO2 26 05/09/2017 0917   GLUCOSE 96 05/09/2017 0917   GLUCOSE 100 (H) 06/03/2015 0747   BUN 19 05/09/2017 0917   CREATININE 0.82 05/09/2017 0917   CALCIUM 9.4  05/09/2017 0917   GFRNONAA 78 05/09/2017 0917   GFRAA 90 05/09/2017 0917   Lab Results  Component Value Date   HGBA1C 5.6 05/09/2017   HGBA1C 5.6 12/06/2016   HGBA1C 5.6 05/17/2016   HGBA1C 5.9 09/22/2015   HGBA1C 6.2 06/03/2015   Lab Results  Component Value Date   INSULIN 6.1 05/09/2017   INSULIN 11.2 12/06/2016   INSULIN 9.8 05/17/2016   CBC    Component Value Date/Time   WBC 6.3 05/09/2017 0917   WBC 6.5 11/13/2012 1003   RBC  4.73 05/09/2017 0917   RBC 5.03 11/13/2012 1003   HGB 14.0 05/09/2017 0917   HCT 42.8 05/09/2017 0917   PLT 315 05/09/2017 0917   MCV 91 05/09/2017 0917   MCH 29.6 05/09/2017 0917   MCH 26.9 (A) 05/20/2012 1404   MCHC 32.7 05/09/2017 0917   MCHC 33.5 11/13/2012 1003   RDW 13.3 05/09/2017 0917   LYMPHSABS 1.8 05/09/2017 0917   MONOABS 0.4 11/13/2012 1003   EOSABS 0.1 05/09/2017 0917   BASOSABS 0.0 05/09/2017 0917   Iron/TIBC/Ferritin/ %Sat    Component Value Date/Time   IRON 88 05/20/2012 1422   TIBC 303 05/20/2012 1422   FERRITIN 80 05/20/2012 1422   IRONPCTSAT 29 05/20/2012 1422   Lipid Panel     Component Value Date/Time   CHOL 181 05/09/2017 0917   TRIG 39 05/09/2017 0917   HDL 67 05/09/2017 0917   CHOLHDL 2.7 05/09/2017 0917   CHOLHDL 3 06/03/2015 0747   VLDL 9.0 06/03/2015 0747   LDLCALC 106 (H) 05/09/2017 0917   Hepatic Function Panel     Component Value Date/Time   PROT 6.9 05/09/2017 0917   ALBUMIN 4.0 05/09/2017 0917   AST 22 05/09/2017 0917   ALT 22 05/09/2017 0917   ALKPHOS 94 05/09/2017 0917   BILITOT 0.5 05/09/2017 0917   BILIDIR 0.1 11/13/2012 1003      Component Value Date/Time   TSH 2.460 05/09/2017 0917   TSH 1.770 05/17/2016 0949   TSH 1.23 11/13/2012 1003    ASSESSMENT AND PLAN: Essential hypertension  Other depression - with emotional eating - Plan: buPROPion (WELLBUTRIN SR) 200 MG 12 hr tablet  Class 1 obesity with serious comorbidity and body mass index (BMI) of 32.0 to 32.9 in adult,  unspecified obesity type - Plan: Liraglutide -Weight Management (SAXENDA) 18 MG/3ML SOPN, Insulin Pen Needle (BD PEN NEEDLE NANO 2ND GEN) 32G X 4 MM MISC  PLAN:  Hypertension We discussed sodium restriction, working on healthy weight loss, and a regular exercise program as the means to achieve improved blood pressure control. Brenda Russo agreed with this plan and agreed to follow up as directed. We will continue to monitor her blood pressure as well as her progress with the above lifestyle modifications. She will continue her medications as prescribed and will watch for signs of hypotension as she continues her lifestyle modifications.  Depression with Emotional Eating Behaviors We discussed behavior modification techniques today to help Brenda Russo deal with her emotional eating and depression. She has agreed to take Wellbutrin SR 150 mg qd and agreed to follow up as directed.  Obesity Brenda Russo is currently in the action stage of change. As such, her goal is to continue with weight loss efforts She has agreed to keep a food journal with 1100-1300 calories and 80+ grams of protein daily Brenda Russo has been instructed to work up to a goal of 150 minutes of combined cardio and strengthening exercise per week for weight loss and overall health benefits. We discussed the following Behavioral Modification Strategies today: increasing lean protein intake, work on meal planning and easy cooking plans and emotional eating strategies We discussed various medication options to help Brenda Russo with her weight loss efforts and we both agreed to start Saxenda 3.0 mg #5 pens with no refills and nano needles with no refills.   Brenda Russo has agreed to follow up with our clinic in 2 to 3 weeks. She was informed of the importance of frequent follow up visits to maximize her success with intensive lifestyle  modifications for her multiple health conditions.   I, Trixie Dredge, am acting as transcriptionist for Dennard Nip,  MD  I have reviewed the above documentation for accuracy and completeness, and I agree with the above. -Dennard Nip, MD

## 2017-09-13 ENCOUNTER — Encounter (INDEPENDENT_AMBULATORY_CARE_PROVIDER_SITE_OTHER): Payer: Self-pay | Admitting: Family Medicine

## 2017-09-14 MED FILL — BUPROPION HCL SR 200 MG TAB: 200 | 30 days supply | Qty: 30 | Fill #0

## 2017-09-18 ENCOUNTER — Encounter (INDEPENDENT_AMBULATORY_CARE_PROVIDER_SITE_OTHER): Payer: Self-pay

## 2017-09-21 ENCOUNTER — Encounter: Payer: Self-pay | Admitting: Internal Medicine

## 2017-09-21 DIAGNOSIS — R928 Other abnormal and inconclusive findings on diagnostic imaging of breast: Secondary | ICD-10-CM | POA: Diagnosis not present

## 2017-09-21 LAB — HM MAMMOGRAPHY

## 2017-09-22 ENCOUNTER — Telehealth: Payer: Self-pay | Admitting: *Deleted

## 2017-09-22 NOTE — Telephone Encounter (Signed)
Received Physician Orders from [place]; forwarded to provider/SLS 08/16

## 2017-09-28 MED FILL — AMLODIPINE BESYLATE 5 MG TA: 5 | 90 days supply | Qty: 90 | Fill #1

## 2017-09-28 MED FILL — LOSARTAN-HCTZ 100-12.5 MG T: 100-12.5 | 90 days supply | Qty: 90 | Fill #1

## 2017-09-28 MED FILL — BREO ELLIPTA 200-25 MCG INH: 200-25 | 90 days supply | Qty: 180 | Fill #1

## 2017-10-10 ENCOUNTER — Ambulatory Visit (INDEPENDENT_AMBULATORY_CARE_PROVIDER_SITE_OTHER): Payer: 59 | Admitting: Family Medicine

## 2017-10-10 VITALS — BP 158/84 | HR 56 | Ht 65.0 in | Wt 200.0 lb

## 2017-10-10 DIAGNOSIS — I10 Essential (primary) hypertension: Secondary | ICD-10-CM

## 2017-10-10 DIAGNOSIS — E669 Obesity, unspecified: Secondary | ICD-10-CM

## 2017-10-10 DIAGNOSIS — R7303 Prediabetes: Secondary | ICD-10-CM

## 2017-10-10 DIAGNOSIS — Z6833 Body mass index (BMI) 33.0-33.9, adult: Secondary | ICD-10-CM

## 2017-10-10 DIAGNOSIS — Z9189 Other specified personal risk factors, not elsewhere classified: Secondary | ICD-10-CM | POA: Diagnosis not present

## 2017-10-10 DIAGNOSIS — E7849 Other hyperlipidemia: Secondary | ICD-10-CM

## 2017-10-10 MED ORDER — NALTREXONE-BUPROPION HCL ER 8-90 MG PO TB12: 2.0000 | ORAL_TABLET | Freq: Two times a day (BID) | ORAL | 0 refills | Status: DC

## 2017-10-10 NOTE — Progress Notes (Signed)
Office: [phone]  /  Fax: [phone]   HPI:   Chief Complaint: OBESITY Brenda Russo is here to discuss her progress with her obesity treatment plan. She is on the keep a food journal with 1100-1300 calories and 80+ grams of protein daily and is following her eating plan approximately 60 % of the time. She states she is exercising 60 minutes 2 times per week. Brenda Russo is struggling to follow a plan. She was prescribed Saxenda but states she doesn't want to do an injection so she didn't start the medication. Her weight is 200 lb (90.7 kg) today and has gained 6 pounds since her last visit. She has lost 0 lbs since starting treatment with [name].  Pre-Diabetes Brenda Russo has a diagnosis of pre-diabetes based on her elevated Hgb A1c and was informed this puts her at greater risk of developing diabetes. She is not taking metformin currently and she is attempting to diet control but she notes simple carbohydrates have increased recently.. She is due for labs and she denies nausea or hypoglycemia.  At risk for diabetes Brenda Russo is at higher than average risk for developing diabetes due to her obesity and pre-diabetes. She currently denies polyuria or polydipsia.  Hypertension Brenda Russo is a 62 y.o. female with hypertension. Diamon's blood pressure is elevated today. She is fasting for labs and didn't take her medications. She denies chest pain. She is working weight loss to help control her blood pressure with the goal of decreasing her risk of heart attack and stroke. Brenda Russo's blood pressure is not currently controlled.  Hyperlipidemia Brenda Russo has hyperlipidemia, she is on fish oil and has been attempting to improve her cholesterol levels with intensive lifestyle modification including a low saturated fat diet, exercise and weight loss. She is due for labs and she denies any chest pain, claudication or myalgias.  ALLERGIES: Allergies  Allergen Reactions  . Eggs Or Egg-Derived  Products     Shortness of breath, wheezing    MEDICATIONS: Current Outpatient Medications on File Prior to Visit  Medication Sig Dispense Refill  . albuterol (VENTOLIN HFA) 108 (90 Base) MCG/ACT inhaler Inhale 1-2 puffs into the lungs every 4 (four) hours as needed for wheezing or shortness of breath. 54 g 1  . amLODipine (NORVASC) 5 MG tablet Take 1 tablet (5 mg total) by mouth daily. 90 tablet 3  . Ascorbic Acid (VITAMIN C) 1000 MG tablet Take 1,000 mg by mouth daily.    . Coenzyme Q10 (COQ10) 100 MG CAPS Take 1 capsule by mouth daily.    . Ergocalciferol 2500 units CAPS Take 5,000 Units by mouth daily. 30 capsule 0  . fluticasone (FLONASE) 50 MCG/ACT nasal spray Place 2 sprays into both nostrils daily. 16 g 5  . fluticasone furoate-vilanterol (BREO ELLIPTA) 200-25 MCG/INH AEPB Inhale 1 puff into the lungs daily. 180 each 3  . losartan-hydrochlorothiazide (HYZAAR) 100-12.5 MG tablet Take 1 tablet by mouth daily. 90 tablet 2  . magnesium oxide (MAG-OX) 400 MG tablet 2 tablets daily. 400-800 mg daily    . meloxicam (MOBIC) 7.5 MG tablet Take 1 tablet (7.5 mg total) by mouth daily. 30 tablet 0  . Multiple Minerals-Vitamins (CALCIUM CITRATE PLUS/MAGNESIUM) TABS Take 1 tablet by mouth.    . Multiple Vitamins-Minerals (COMPLETE WOMENS PO) Take 1 capsule by mouth 2 (two) times daily.      . Omega-3 Fatty Acids (FISH OIL) 1000 MG CAPS Take 1 capsule by mouth.     No current facility-administered medications on file prior  to visit.     PAST MEDICAL HISTORY: Past Medical History:  Diagnosis Date  . Allergic rhinitis   . Anemia   . Anxiety   . Asthma   . HTN (hypertension)   . Hyperlipidemia    borderline  . Obese   . OSA (obstructive sleep apnea)    mild ,no CPAP  . Overweight(278.02)   . Plantar wart    history  . Prediabetes   . Shortness of breath     PAST SURGICAL HISTORY: Past Surgical History:  Procedure Laterality Date  . BREAST BIOPSY Right 09/02/2015  . CESAREAN  SECTION    . TUBAL LIGATION      SOCIAL HISTORY: Social History   Tobacco Use  . Smoking status: Never Smoker  . Smokeless tobacco: Never Used  Substance Use Topics  . Alcohol use: No  . Drug use: No    FAMILY HISTORY: Family History  Problem Relation Age of Onset  . Heart disease Mother        pacemaker   . Hypertension Mother   . Hyperlipidemia Mother   . Obesity Mother   . Asthma Father   . Pancreatic cancer Brother   . Breast cancer Maternal Aunt   . Colon cancer Neg Hx     ROS: Review of Systems  Constitutional: Negative for weight loss.  Cardiovascular: Negative for chest pain and claudication.  Gastrointestinal: Negative for nausea.  Genitourinary: Negative for frequency.  Musculoskeletal: Negative for myalgias.  Endo/Heme/Allergies: Negative for polydipsia.       Negative hypoglycemia    PHYSICAL EXAM: Blood pressure (!) 158/84, pulse (!) 56, height 5\' 5"  (1.651 m), weight 200 lb (90.7 kg), SpO2 100 %. Body mass index is 33.28 kg/m. Physical Exam  Constitutional: She is oriented to person, place, and time. She appears well-developed and well-nourished.  Cardiovascular: Normal rate.  Pulmonary/Chest: Effort normal.  Musculoskeletal: Normal range of motion.  Neurological: She is oriented to person, place, and time.  Skin: Skin is warm and dry.  Psychiatric: She has a normal mood and affect. Her behavior is normal.  Vitals reviewed.   RECENT LABS AND TESTS: BMET    Component Value Date/Time   NA 144 05/09/2017 0917   K 3.8 05/09/2017 0917   CL 101 05/09/2017 0917   CO2 26 05/09/2017 0917   GLUCOSE 96 05/09/2017 0917   GLUCOSE 100 (H) 06/03/2015 0747   BUN 19 05/09/2017 0917   CREATININE 0.82 05/09/2017 0917   CALCIUM 9.4 05/09/2017 0917   GFRNONAA 78 05/09/2017 0917   GFRAA 90 05/09/2017 0917   Lab Results  Component Value Date   HGBA1C 5.6 05/09/2017   HGBA1C 5.6 12/06/2016   HGBA1C 5.6 05/17/2016   HGBA1C 5.9 09/22/2015   HGBA1C 6.2  06/03/2015   Lab Results  Component Value Date   INSULIN 6.1 05/09/2017   INSULIN 11.2 12/06/2016   INSULIN 9.8 05/17/2016   CBC    Component Value Date/Time   WBC 6.3 05/09/2017 0917   WBC 6.5 11/13/2012 1003   RBC 4.73 05/09/2017 0917   RBC 5.03 11/13/2012 1003   HGB 14.0 05/09/2017 0917   HCT 42.8 05/09/2017 0917   PLT 315 05/09/2017 0917   MCV 91 05/09/2017 0917   MCH 29.6 05/09/2017 0917   MCH 26.9 (A) 05/20/2012 1404   MCHC 32.7 05/09/2017 0917   MCHC 33.5 11/13/2012 1003   RDW 13.3 05/09/2017 0917   LYMPHSABS 1.8 05/09/2017 0917   MONOABS 0.4 11/13/2012 1003  EOSABS 0.1 05/09/2017 0917   BASOSABS 0.0 05/09/2017 0917   Iron/TIBC/Ferritin/ %Sat    Component Value Date/Time   IRON 88 05/20/2012 1422   TIBC 303 05/20/2012 1422   FERRITIN 80 05/20/2012 1422   IRONPCTSAT 29 05/20/2012 1422   Lipid Panel     Component Value Date/Time   CHOL 181 05/09/2017 0917   TRIG 39 05/09/2017 0917   HDL 67 05/09/2017 0917   CHOLHDL 2.7 05/09/2017 0917   CHOLHDL 3 06/03/2015 0747   VLDL 9.0 06/03/2015 0747   LDLCALC 106 (H) 05/09/2017 0917   Hepatic Function Panel     Component Value Date/Time   PROT 6.9 05/09/2017 0917   ALBUMIN 4.0 05/09/2017 0917   AST 22 05/09/2017 0917   ALT 22 05/09/2017 0917   ALKPHOS 94 05/09/2017 0917   BILITOT 0.5 05/09/2017 0917   BILIDIR 0.1 11/13/2012 1003      Component Value Date/Time   TSH 2.460 05/09/2017 0917   TSH 1.770 05/17/2016 0949   TSH 1.23 11/13/2012 1003    ASSESSMENT AND PLAN: Prediabetes - Plan: Hemoglobin A1c, Insulin, random, VITAMIN D 25 Hydroxy (Vit-D Deficiency, Fractures)  Essential hypertension - Plan: Comprehensive metabolic panel, VITAMIN D 25 Hydroxy (Vit-D Deficiency, Fractures)  Other hyperlipidemia  At risk for diabetes mellitus  Class 1 obesity with serious comorbidity and body mass index (BMI) of 33.0 to 33.9 in adult, unspecified obesity type - Plan: Naltrexone-buPROPion HCl ER 8-90 MG  TB12  PLAN:  Pre-Diabetes Brenda Russo will continue to work on weight loss, exercise, and decreasing simple carbohydrates in her diet to help decrease the risk of diabetes. We dicussed metformin including benefits and risks. She was informed that eating too many simple carbohydrates or too many calories at one sitting increases the likelihood of GI side effects. [place] declined metformin for now and a prescription was not written today. We will check labs and Mystery agreed to follow up with our clinic in 2 to 3 weeks as directed to monitor her progress.  Diabetes risk counselling Brenda Russo was given extended (15 minutes) diabetes prevention counseling today. She is 62 y.o. female and has risk factors for diabetes including obesity and pre-diabetes. We discussed intensive lifestyle modifications today with an emphasis on weight loss as well as increasing exercise and decreasing simple carbohydrates in her diet.  Hypertension We discussed sodium restriction, working on healthy weight loss, and a regular exercise program as the means to achieve improved blood pressure control. Brenda Russo agreed with this plan and agreed to follow up as directed. We will continue to monitor her blood pressure as well as her progress with the above lifestyle modifications. She will continue her medications and will watch for signs of hypotension as she continues her lifestyle modifications. We will check labs and Brenda Russo agrees to follow up with our clinic in 2 to 3 week, and we will recheck blood pressure at that time.  Hyperlipidemia Brenda Russo was informed of the American Heart Association Guidelines emphasizing intensive lifestyle modifications as the first line treatment for hyperlipidemia. We discussed many lifestyle modifications today in depth, and Brenda Russo will continue to work on decreasing saturated fats such as fatty red meat, butter and many fried foods. She will also increase vegetables and lean protein in her  diet and continue to work on diet, exercise, and weight loss efforts. We will check labs and Brenda Russo agrees to follow up with our clinic in 2 to 3 weeks.  Obesity Brenda Russo is currently in the action stage of change.  As such, her goal is to continue with weight loss efforts She has agreed to keep a food journal with 1100-1300 calories and 80+ grams of protein daily Brenda Russo has been instructed to work up to a goal of 150 minutes of combined cardio and strengthening exercise per week for weight loss and overall health benefits. We discussed the following Behavioral Modification Strategies today: increasing lean protein intake We discussed various medication options to help Brenda Russo with her weight loss efforts and we both agreed to start Contrave 2 tablets PO BID #120 with no refills (Pt. to start at 1 tablet PO q AM), and she agrees to discontinue Saxenda and Wellbutrin.  Brenda Russo has agreed to follow up with our clinic in 2 to 3 weeks. She was informed of the importance of frequent follow up visits to maximize her success with intensive lifestyle modifications for her multiple health conditions.   OBESITY BEHAVIORAL INTERVENTION VISIT  Today's visit was # 30.  Starting weight: 200 lbs Starting date: 05/17/16 Today's weight : 200 lbs  Today's date: 10/10/2017 Total lbs lost to date: 0   ASK: We discussed the diagnosis of obesity with Brenda Russo today and Brenda Russo agreed to give [name] permission to discuss obesity behavioral modification therapy today.  ASSESS: Brenda Russo has the diagnosis of obesity and her BMI today is 33.28 Brenda Russo is in the action stage of change   ADVISE: Brenda Russo was educated on the multiple health risks of obesity as well as the benefit of weight loss to improve her health. She was advised of the need for long term treatment and the importance of lifestyle modifications to improve her current health and to decrease her risk of future health  problems.  AGREE: Multiple dietary modification options and treatment options were discussed and  Brenda Russo agreed to follow the recommendations documented in the above note.  ARRANGE: Anslie was educated on the importance of frequent visits to treat obesity as outlined per CMS and USPSTF guidelines and agreed to schedule her next follow up appointment today.  I, Trixie Dredge, am acting as transcriptionist for Dennard Nip, MD  I have reviewed the above documentation for accuracy and completeness, and I agree with the above. -Dennard Nip, MD

## 2017-10-11 ENCOUNTER — Encounter (INDEPENDENT_AMBULATORY_CARE_PROVIDER_SITE_OTHER): Payer: Self-pay

## 2017-10-11 LAB — COMPREHENSIVE METABOLIC PANEL
ALT: 19 IU/L (ref 0–32)
AST: 22 IU/L (ref 0–40)
Albumin/Globulin Ratio: 1.3 (ref 1.2–2.2)
Albumin: 4 g/dL (ref 3.6–4.8)
Alkaline Phosphatase: 92 IU/L (ref 39–117)
BUN/Creatinine Ratio: 18 (ref 12–28)
BUN: 13 mg/dL (ref 8–27)
Bilirubin Total: 0.4 mg/dL (ref 0.0–1.2)
CO2: 26 mmol/L (ref 20–29)
CREATININE: 0.74 mg/dL (ref 0.57–1.00)
Calcium: 9.1 mg/dL (ref 8.7–10.3)
Chloride: 102 mmol/L (ref 96–106)
GFR, EST AFRICAN AMERICAN: 102 mL/min/{1.73_m2} (ref >59)
GFR, EST NON AFRICAN AMERICAN: 88 mL/min/{1.73_m2} (ref >59)
Globulin, Total: 3 g/dL (ref 1.5–4.5)
Glucose: 93 mg/dL (ref 65–99)
Potassium: 4.5 mmol/L (ref 3.5–5.2)
Sodium: 140 mmol/L (ref 134–144)
Total Protein: 7 g/dL (ref 6.0–8.5)

## 2017-10-11 LAB — VITAMIN D 25 HYDROXY (VIT D DEFICIENCY, FRACTURES): VIT D 25 HYDROXY: 85.3 ng/mL (ref 30.0–100.0)

## 2017-10-11 LAB — INSULIN, RANDOM: INSULIN: 6.1 u[IU]/mL (ref 2.6–24.9)

## 2017-10-11 LAB — HEMOGLOBIN A1C
ESTIMATED AVERAGE GLUCOSE: 111 mg/dL
Hgb A1c MFr Bld: 5.5 % (ref 4.8–5.6)

## 2017-10-17 MED FILL — CONTRAVE ER 8-90 MG TABLET: 8-90 | 18 days supply | Qty: 70 | Fill #0

## 2017-11-01 ENCOUNTER — Ambulatory Visit (INDEPENDENT_AMBULATORY_CARE_PROVIDER_SITE_OTHER): Payer: 59 | Admitting: Family Medicine

## 2017-11-01 VITALS — BP 111/73 | HR 78 | Temp 97.7°F | Ht 65.0 in | Wt 198.0 lb

## 2017-11-01 DIAGNOSIS — Z6833 Body mass index (BMI) 33.0-33.9, adult: Secondary | ICD-10-CM

## 2017-11-01 DIAGNOSIS — Z9189 Other specified personal risk factors, not elsewhere classified: Secondary | ICD-10-CM

## 2017-11-01 DIAGNOSIS — E559 Vitamin D deficiency, unspecified: Secondary | ICD-10-CM | POA: Diagnosis not present

## 2017-11-01 DIAGNOSIS — E669 Obesity, unspecified: Secondary | ICD-10-CM | POA: Diagnosis not present

## 2017-11-02 NOTE — Progress Notes (Signed)
Office: [phone]  /  Fax: [phone]   HPI:   Chief Complaint: OBESITY Brenda Russo is here to discuss her progress with her obesity treatment plan. She is on the keep a food journal with 1100-1300 calories and 80+ grams of protein daily and is following her eating plan approximately 50 % of the time. She states she is exercising 45 minutes 3-4 times per week. Brenda Russo has done well with weight loss on Contrave. She increased dose to BID 3 days ago and she feels it is starting to help with hunger and cravings.  Her weight is 198 lb (89.8 kg) today and has had a weight loss of 2 pounds over a period of 3 weeks since her last visit. She has lost 2 lbs since starting treatment with [name].  Vitamin D Deficiency Brenda Russo has a diagnosis of vitamin D deficiency. She is on OTC Vit D 5,000 IU daily and her level is now in the 80's. She is at risk of over-replacement. She denies nausea, vomiting or muscle weakness.  At risk for osteopenia and osteoporosis Brenda Russo is at higher risk of osteopenia and osteoporosis due to vitamin D deficiency.   ALLERGIES: Allergies  Allergen Reactions  . Eggs Or Egg-Derived Products     Shortness of breath, wheezing    MEDICATIONS: Current Outpatient Medications on File Prior to Visit  Medication Sig Dispense Refill  . albuterol (VENTOLIN HFA) 108 (90 Base) MCG/ACT inhaler Inhale 1-2 puffs into the lungs every 4 (four) hours as needed for wheezing or shortness of breath. 54 g 1  . amLODipine (NORVASC) 5 MG tablet Take 1 tablet (5 mg total) by mouth daily. 90 tablet 3  . Ascorbic Acid (VITAMIN C) 1000 MG tablet Take 1,000 mg by mouth daily.    . Coenzyme Q10 (COQ10) 100 MG CAPS Take 1 capsule by mouth daily.    . Ergocalciferol 2500 units CAPS Take 5,000 Units by mouth daily. 30 capsule 0  . fluticasone (FLONASE) 50 MCG/ACT nasal spray Place 2 sprays into both nostrils daily. 16 g 5  . fluticasone furoate-vilanterol (BREO ELLIPTA) 200-25 MCG/INH AEPB Inhale 1  puff into the lungs daily. 180 each 3  . losartan-hydrochlorothiazide (HYZAAR) 100-12.5 MG tablet Take 1 tablet by mouth daily. 90 tablet 2  . magnesium oxide (MAG-OX) 400 MG tablet 2 tablets daily. 400-800 mg daily    . meloxicam (MOBIC) 7.5 MG tablet Take 1 tablet (7.5 mg total) by mouth daily. 30 tablet 0  . Multiple Minerals-Vitamins (CALCIUM CITRATE PLUS/MAGNESIUM) TABS Take 1 tablet by mouth.    . Multiple Vitamins-Minerals (COMPLETE WOMENS PO) Take 1 capsule by mouth 2 (two) times daily.      . Naltrexone-buPROPion HCl ER 8-90 MG TB12 Take 2 tablets by mouth 2 (two) times daily. 120 tablet 0  . Omega-3 Fatty Acids (FISH OIL) 1000 MG CAPS Take 1 capsule by mouth.     No current facility-administered medications on file prior to visit.     PAST MEDICAL HISTORY: Past Medical History:  Diagnosis Date  . Allergic rhinitis   . Anemia   . Anxiety   . Asthma   . HTN (hypertension)   . Hyperlipidemia    borderline  . Obese   . OSA (obstructive sleep apnea)    mild ,no CPAP  . Overweight(278.02)   . Plantar wart    history  . Prediabetes   . Shortness of breath     PAST SURGICAL HISTORY: Past Surgical History:  Procedure Laterality Date  .  BREAST BIOPSY Right 09/02/2015  . CESAREAN SECTION    . TUBAL LIGATION      SOCIAL HISTORY: Social History   Tobacco Use  . Smoking status: Never Smoker  . Smokeless tobacco: Never Used  Substance Use Topics  . Alcohol use: No  . Drug use: No    FAMILY HISTORY: Family History  Problem Relation Age of Onset  . Heart disease Mother        pacemaker   . Hypertension Mother   . Hyperlipidemia Mother   . Obesity Mother   . Asthma Father   . Pancreatic cancer Brother   . Breast cancer Maternal Aunt   . Colon cancer Neg Hx     ROS: Review of Systems  Constitutional: Positive for weight loss.  Gastrointestinal: Negative for nausea and vomiting.  Musculoskeletal:       Negative muscle weakness    PHYSICAL EXAM: Blood  pressure 111/73, pulse 78, temperature 97.7 F (36.5 C), temperature source Oral, height 5\' 5"  (1.651 m), weight 198 lb (89.8 kg), SpO2 97 %. Body mass index is 32.95 kg/m. Physical Exam  Constitutional: She is oriented to person, place, and time. She appears well-developed and well-nourished.  Cardiovascular: Normal rate.  Pulmonary/Chest: Effort normal.  Musculoskeletal: Normal range of motion.  Neurological: She is oriented to person, place, and time.  Skin: Skin is warm and dry.  Psychiatric: She has a normal mood and affect. Her behavior is normal.  Vitals reviewed.   RECENT LABS AND TESTS: BMET    Component Value Date/Time   NA 140 10/10/2017 1040   K 4.5 10/10/2017 1040   CL 102 10/10/2017 1040   CO2 26 10/10/2017 1040   GLUCOSE 93 10/10/2017 1040   GLUCOSE 100 (H) 06/03/2015 0747   BUN 13 10/10/2017 1040   CREATININE 0.74 10/10/2017 1040   CALCIUM 9.1 10/10/2017 1040   GFRNONAA 88 10/10/2017 1040   GFRAA 102 10/10/2017 1040   Lab Results  Component Value Date   HGBA1C 5.5 10/10/2017   HGBA1C 5.6 05/09/2017   HGBA1C 5.6 12/06/2016   HGBA1C 5.6 05/17/2016   HGBA1C 5.9 09/22/2015   Lab Results  Component Value Date   INSULIN 6.1 10/10/2017   INSULIN 6.1 05/09/2017   INSULIN 11.2 12/06/2016   INSULIN 9.8 05/17/2016   CBC    Component Value Date/Time   WBC 6.3 05/09/2017 0917   WBC 6.5 11/13/2012 1003   RBC 4.73 05/09/2017 0917   RBC 5.03 11/13/2012 1003   HGB 14.0 05/09/2017 0917   HCT 42.8 05/09/2017 0917   PLT 315 05/09/2017 0917   MCV 91 05/09/2017 0917   MCH 29.6 05/09/2017 0917   MCH 26.9 (A) 05/20/2012 1404   MCHC 32.7 05/09/2017 0917   MCHC 33.5 11/13/2012 1003   RDW 13.3 05/09/2017 0917   LYMPHSABS 1.8 05/09/2017 0917   MONOABS 0.4 11/13/2012 1003   EOSABS 0.1 05/09/2017 0917   BASOSABS 0.0 05/09/2017 0917   Iron/TIBC/Ferritin/ %Sat    Component Value Date/Time   IRON 88 05/20/2012 1422   TIBC 303 05/20/2012 1422   FERRITIN 80  05/20/2012 1422   IRONPCTSAT 29 05/20/2012 1422   Lipid Panel     Component Value Date/Time   CHOL 181 05/09/2017 0917   TRIG 39 05/09/2017 0917   HDL 67 05/09/2017 0917   CHOLHDL 2.7 05/09/2017 0917   CHOLHDL 3 06/03/2015 0747   VLDL 9.0 06/03/2015 0747   LDLCALC 106 (H) 05/09/2017 0917   Hepatic Function Panel  Component Value Date/Time   PROT 7.0 10/10/2017 1040   ALBUMIN 4.0 10/10/2017 1040   AST 22 10/10/2017 1040   ALT 19 10/10/2017 1040   ALKPHOS 92 10/10/2017 1040   BILITOT 0.4 10/10/2017 1040   BILIDIR 0.1 11/13/2012 1003      Component Value Date/Time   TSH 2.460 05/09/2017 0917   TSH 1.770 05/17/2016 0949   TSH 1.23 11/13/2012 1003  Results for Brenda Russo, Brenda Russo (MRN [id number]) as of 11/02/2017 07:10  Ref. Range 10/10/2017 10:40  Vitamin D, 25-Hydroxy Latest Ref Range: 30.0 - 100.0 ng/mL 85.3    ASSESSMENT AND PLAN: Vitamin D deficiency  At risk for osteoporosis  Class 1 obesity with serious comorbidity and body mass index (BMI) of 33.0 to 33.9 in adult, unspecified obesity type  PLAN:  Vitamin D Deficiency Brenda Russo was informed that low vitamin D levels contributes to fatigue and are associated with obesity, breast, and colon cancer. Brenda Russo agrees to decrease OTC Vit D to 10,000 IU weekly and will follow up for routine testing of vitamin D, at least 2-3 times per year. She was informed of the risk of over-replacement of vitamin D and agrees to not increase her dose unless she discusses this with [name] first. We will recheck labs in 3 months. Brenda Russo agrees to follow up with our clinic in 2 to 3 weeks.  At risk for osteopenia and osteoporosis Brenda Russo was given extended  (15 minutes) osteoporosis prevention counseling today. Brenda Russo is at risk for osteopenia and osteoporsis due to her vitamin D deficiency. She was encouraged to take her vitamin D and follow her higher calcium diet and increase strengthening exercise to help strengthen her bones and  decrease her risk of osteopenia and osteoporosis.   Obesity Brenda Russo is currently in the action stage of change. As such, her goal is to continue with weight loss efforts She has agreed to keep a food journal with 1100-1300 calories and 80+ grams of protein daily Brenda Russo has been instructed to work up to a goal of 150 minutes of combined cardio and strengthening exercise per week for weight loss and overall health benefits. We discussed the following Behavioral Modification Strategies today: increasing lean protein intake and decreasing simple carbohydrates  Brenda Russo is ok to increase Contrave to 2 tablets q AM and 1 tablet q PM in another week if she feels she needs the higher dose.   Brenda Russo has agreed to follow up with our clinic in 2 to 3 weeks. She was informed of the importance of frequent follow up visits to maximize her success with intensive lifestyle modifications for her multiple health conditions.   OBESITY BEHAVIORAL INTERVENTION VISIT  Today's visit was # 31   Starting weight: 200 lbs Starting date: 05/17/16 Today's weight : 198 lbs Today's date: 11/01/2017 Total lbs lost to date: 2    ASK: We discussed the diagnosis of obesity with Brenda Russo today and Brenda Russo agreed to give [name] permission to discuss obesity behavioral modification therapy today.  ASSESS: Kayce has the diagnosis of obesity and her BMI today is 32.95 Brenda Russo is in the action stage of change   ADVISE: Brenda Russo was educated on the multiple health risks of obesity as well as the benefit of weight loss to improve her health. She was advised of the need for long term treatment and the importance of lifestyle modifications to improve her current health and to decrease her risk of future health problems.  AGREE: Multiple dietary modification options and treatment options were  discussed and  Beyounce agreed to follow the recommendations documented in the above note.  ARRANGE: Brenda Russo was  educated on the importance of frequent visits to treat obesity as outlined per CMS and USPSTF guidelines and agreed to schedule her next follow up appointment today.  I, Trixie Dredge, am acting as transcriptionist for Dennard Nip, MD  I have reviewed the above documentation for accuracy and completeness, and I agree with the above. -Dennard Nip, MD

## 2017-11-07 ENCOUNTER — Encounter: Payer: Self-pay | Admitting: Medical

## 2017-11-07 ENCOUNTER — Ambulatory Visit: Payer: 59 | Admitting: Medical

## 2017-11-07 ENCOUNTER — Ambulatory Visit: Payer: Self-pay

## 2017-11-07 VITALS — BP 140/80 | HR 61 | Temp 98.2°F | Resp 16 | Ht 65.0 in | Wt 200.8 lb

## 2017-11-07 DIAGNOSIS — R079 Chest pain, unspecified: Secondary | ICD-10-CM

## 2017-11-07 DIAGNOSIS — R0789 Other chest pain: Secondary | ICD-10-CM | POA: Diagnosis not present

## 2017-11-07 LAB — TROPONIN I: TNIDX: 0.01 ug/L (ref 0.00–0.06)

## 2017-11-07 MED ORDER — CYCLOBENZAPRINE HCL 5 MG PO TABS: 5.0000 mg | ORAL_TABLET | Freq: Every day | ORAL | 0 refills | Status: DC

## 2017-11-07 MED FILL — CYCLOBENZAPRINE 5 MG TABLET: 5 | 10 days supply | Qty: 10 | Fill #0

## 2017-11-07 NOTE — Progress Notes (Signed)
Subjective:    Patient ID: Brenda Russo, female    DOB: [birth year], 62 y.o.   MRN: [id number]  HPI  Pt in with some recent lower chest pain right after eating last night. She states she ate chicken and corn on the cobb. No burning pain. No obvious belching. She states pain last 5-10 minutes last night. Pt ate last night at 8 pm. Pt has history of heart burn but symptom usually different/ higher up. After 10 minutes pain subsided.  Pt mentions some pain in her neck for past 3-4 days. Points to trapezius area. Some she states pain that has been constant. Pt states pain in neck was hurting with turning her head. She used biofreeze this morning and she states it did help. Pt had faint discomfort discomfort of her rt jaw last night but non this morning. No left side jaw pain recently,.  No left arm pain or shoulder pain.  No sob on ambulation. Hx of mild ldl elevation. No hx of smoking. Normal a1c. Pt mom had htn and pacemaker. Pt mom stroke when 75 yo. Dad diseased 30 yo from described lung disease. 2 siblings one deceased pancrease cancer and MVA. Pt sister healthy.    Review of Systems  Constitutional: Negative for chills and fatigue.  Respiratory: Negative for cough, chest tightness, shortness of breath and wheezing.   Cardiovascular: Negative for chest pain and palpitations.  Gastrointestinal: Negative for abdominal pain, constipation and nausea.  Musculoskeletal: Positive for neck pain. Negative for back pain.       Trapezius tenderness.  Skin: Negative for rash.  Neurological: Negative for dizziness, weakness, light-headedness, numbness and headaches.  Hematological: Negative for adenopathy. Does not bruise/bleed easily.  Psychiatric/Behavioral: Negative for behavioral problems, confusion, decreased concentration and dysphoric mood. The patient is not nervous/anxious.     Past Medical History:  Diagnosis Date  . Allergic rhinitis   . Anemia   . Anxiety   . Asthma   . HTN  (hypertension)   . Hyperlipidemia    borderline  . Obese   . OSA (obstructive sleep apnea)    mild ,no CPAP  . Overweight(278.02)   . Plantar wart    history  . Prediabetes   . Shortness of breath      Social History   Socioeconomic History  . Marital status: Married    Spouse name: Not on file  . Number of children: 3  . Years of education: Not on file  . Highest education level: Not on file  Occupational History  . Occupation: Retail banker at [employer] [place]  . Financial resource strain: Not on file  . Food insecurity:    Worry: Not on file    Inability: Not on file  . Transportation needs:    Medical: Not on file    Non-medical: Not on file  Tobacco Use  . Smoking status: Never Smoker  . Smokeless tobacco: Never Used  Substance and Sexual Activity  . Alcohol use: No  . Drug use: No  . Sexual activity: Not Currently  Lifestyle  . Physical activity:    Days per week: Not on file    Minutes per session: Not on file  . Stress: Not on file  Relationships  . Social connections:    Talks on phone: Not on file    Gets together: Not on file    Attends religious service: Not on  file    Active member of club or organization: Not on file    Attends meetings of clubs or organizations: Not on file    Relationship status: Not on file  . Intimate partner violence:    Fear of current or ex partner: Not on file    Emotionally abused: Not on file    Physically abused: Not on file    Forced sexual activity: Not on file  Other Topics Concern  . Not on file  Social History Narrative   3 biological children   1 adopted child   3 step-children   Household-- pt , husband and a son    Past Surgical History:  Procedure Laterality Date  . BREAST BIOPSY Right 09/02/2015  . CESAREAN SECTION    . TUBAL LIGATION      Family History  Problem Relation Age of Onset  . Heart disease Mother        pacemaker   .  Hypertension Mother   . Hyperlipidemia Mother   . Obesity Mother   . Asthma Father   . Pancreatic cancer Brother   . Breast cancer Maternal Aunt   . Colon cancer Neg Hx     Allergies  Allergen Reactions  . Eggs Or Egg-Derived Products     Shortness of breath, wheezing    Current Outpatient Medications on File Prior to Visit  Medication Sig Dispense Refill  . albuterol (VENTOLIN HFA) 108 (90 Base) MCG/ACT inhaler Inhale 1-2 puffs into the lungs every 4 (four) hours as needed for wheezing or shortness of breath. 54 g 1  . amLODipine (NORVASC) 5 MG tablet Take 1 tablet (5 mg total) by mouth daily. 90 tablet 3  . Ascorbic Acid (VITAMIN C) 1000 MG tablet Take 1,000 mg by mouth daily.    . Coenzyme Q10 (COQ10) 100 MG CAPS Take 1 capsule by mouth daily.    . Ergocalciferol 2500 units CAPS Take 5,000 Units by mouth daily. 30 capsule 0  . fluticasone (FLONASE) 50 MCG/ACT nasal spray Place 2 sprays into both nostrils daily. 16 g 5  . fluticasone furoate-vilanterol (BREO ELLIPTA) 200-25 MCG/INH AEPB Inhale 1 puff into the lungs daily. 180 each 3  . losartan-hydrochlorothiazide (HYZAAR) 100-12.5 MG tablet Take 1 tablet by mouth daily. 90 tablet 2  . magnesium oxide (MAG-OX) 400 MG tablet 2 tablets daily. 400-800 mg daily    . meloxicam (MOBIC) 7.5 MG tablet Take 1 tablet (7.5 mg total) by mouth daily. 30 tablet 0  . Multiple Minerals-Vitamins (CALCIUM CITRATE PLUS/MAGNESIUM) TABS Take 1 tablet by mouth.    . Multiple Vitamins-Minerals (COMPLETE WOMENS PO) Take 1 capsule by mouth 2 (two) times daily.      . Naltrexone-buPROPion HCl ER 8-90 MG TB12 Take 2 tablets by mouth 2 (two) times daily. 120 tablet 0  . Omega-3 Fatty Acids (FISH OIL) 1000 MG CAPS Take 1 capsule by mouth.     No current facility-administered medications on file prior to visit.     BP (!) 149/77   Pulse 61   Temp 98.2 F (36.8 C) (Oral)   Resp 16   Ht 5\' 5"  (1.651 m)   Wt 200 lb 12.8 oz (91.1 kg)   SpO2 98%   BMI  33.41 kg/m       Objective:   Physical Exam  General Mental Status- Alert. General Appearance- Not in acute distress.   Skin General: Color- Normal Color. Moisture- Normal Moisture.  Neck Carotid Arteries- Normal color. Moisture- Normal  Moisture. No carotid bruits. No JVD. Bilateral trapezius tenderness. Worse on rt side.  Chest and Lung Exam Auscultation: Breath Sounds:-Normal.  Cardiovascular Auscultation:Rythm- Regular. Murmurs & Other Heart Sounds:Auscultation of the heart reveals- No Murmurs.  Abdomen Inspection:-Inspeection Normal. Palpation/Percussion:Note:No mass. Palpation and Percussion of the abdomen reveal- Non Tender, Non Distended + BS, no rebound or guarding.    Neurologic Cranial Nerve exam:- CN III-XII intact(No nystagmus), symmetric smile. Strength:- 5/5 equal and symmetric strength both upper and lower extremities.  Mouth- poor dentitions. Some scattered teeth missing.         Assessment & Plan:  You have had some recent atypical type lower chest discomfort.  No discomfort in chest since around 8:00 yesterday evening.  Those symptoms resolved within 10 minutes.  We will do stat troponin today.  Reminded that if you do get recurrent chest pain in that event would recommend ED evaluation.  Also in the future if you have some atypical recurrent type chest symptoms would probably refer you to cardiologist for a stress test.  EKG today showed sinus bradycardia.  Very similar EKG to one done in the past.  Note on v4  appear to have some minimal possible artifact present.  Some history of occasional reflux.  Recommend that you get Pepcid or Tagamet over-the-counter.   Follow-up in 7 to 10 days or as needed.  I will need to call you later with the stat troponin result.  Please keep your cell phone charged and volume up.  I will try cell phone first before calling your home.  Mackie Pai, PA-C

## 2017-11-07 NOTE — Telephone Encounter (Signed)
Author spoke to pt. as well re: chest pain. Denies pain at the moment. Pt. took her BP during her episode of CP and it was 140s/80s per pt. report. BP is currently 170s/100s. Pt. stated she has not taken her BP meds yet. Pt. did not want to go to ED, although author encouraged her to do so. Appointment for 1000 made with Percell Miller, [place], and author instructed pt. To take her BP meds prior to her appointment, with understanding that she may be sent to ED anyway, and pt. Verbalized understanding.

## 2017-11-07 NOTE — Telephone Encounter (Signed)
Pt. Called to report onset of chest pain last night between her breasts, that lasted 5-10 min.  Rated pain at 7/10; denied that there was any radiation of the chest pain.  Did state she has had post. and right sided neck stiffness for 3-4 days, and at time of the chest pain, the neck stiffness/ discomfort went into the right jaw and right ear.  Denied any recurrence of the chest pain. But stated her neck remains stiff.  Denied shortness of breath, nausea, vomiting, or sweating.  Stated has intermittent dizziness with position change, that has been present prior to onset of chest pain. Reported BP last night of 149/83 during the episode of chest pain.  At present, BP is 176/103, pulse 63.  Has not taken AM BP medication.    Called [facility]; spoke with Raquel Sarna.  Pt. Will be worked in to the PA schedule this morning at 10:00 AM.  Pt. Verb. Understanding; agrees with plan.        Reason for Disposition . Chest pain lasts > 5 minutes (Exceptions: chest pain occurring > 3 days ago and now asymptomatic; same as previously diagnosed heartburn and has accompanying sour taste in mouth)  Answer Assessment - Initial Assessment Questions 1. LOCATION: "Where does it hurt?"       C/o chest pain in mid chest, between breasts last night  2. RADIATION: "Does the pain go anywhere else?" (e.g., into neck, jaw, arms, back)     C/o neck stiffness x 3-4 days; Had neck pain that radiated to the right jaw and right ear last night 3. ONSET: "When did the chest pain begin?" (Minutes, hours or days)      last night about 8:00 PM   4. PATTERN "Does the pain come and go, or has it been constant since it started?"  "Does it get worse with exertion?"     Comes and goes  5. DURATION: "How long does it last" (e.g., seconds, minutes, hours)    5-10 minutes 6. SEVERITY: "How bad is the pain?"  (e.g., Scale 1-10; mild, moderate, or severe)    - MILD (1-3): doesn't interfere with normal activities     - MODERATE (4-7): interferes with normal  activities or awakens from sleep    - SEVERE (8-10): excruciating pain, unable to do any normal activities       Reported "sharp" pain at a 7/10  7. CARDIAC RISK FACTORS: "Do you have any history of heart problems or risk factors for heart disease?" (e.g., prior heart attack, angina; high blood pressure, diabetes, being overweight, high cholesterol, smoking, or strong family history of heart disease)     denied DM, denied high cholesterol; denied smoking; admits to elevated BP and being overweight; hx of bradycardia  8. PULMONARY RISK FACTORS: "Do you have any history of lung disease?"  (e.g., blood clots in lung, asthma, emphysema, birth control pills)     asthma 9. CAUSE: "What do you think is causing the chest pain?"     Denied any prev. Chest pain; unknown as to cause  10. OTHER SYMPTOMS: "Do you have any other symptoms?" (e.g., dizziness, nausea, vomiting, sweating, fever, difficulty breathing, cough)       Has hx vertigo; admits to some dizziness with change in position (has been going on)  C/o neck stiffness x 3-4 days in post. Neck @ base of skull, and now into right side of neck   11. PREGNANCY: "Is there any chance you are pregnant?" "When was your  last menstrual period?"       n/a  Protocols used: CHEST PAIN-A-AH

## 2017-11-07 NOTE — Patient Instructions (Addendum)
You have had some recent atypical type lower chest discomfort.  No discomfort in chest since around 8:00 yesterday evening.  Those symptoms resolved within 10 minutes.  We will do stat troponin today.  Reminded that if you do get recurrent chest pain in that event would recommend ED evaluation.  Also in the future if you have some atypical recurrent type chest symptoms would probably refer you to cardiologist for a stress test.  EKG today showed sinus bradycardia.  Very similar EKG to one done in the past.  Note on v4  appear to have some minimal possible artifact present.  Some history of occasional reflux.  Recommend that you get Pepcid or Tagamet over-the-counter.   Follow-up in 7 to 10 days or as needed.  I will need to call you later with the stat troponin result.  Please keep your cell phone charged and volume up.  I will try cell phone first before calling your home.

## 2017-11-13 MED FILL — CONTRAVE ER 8-90 MG TABLET: 8-90 | 13 days supply | Qty: 50 | Fill #1

## 2017-11-20 ENCOUNTER — Ambulatory Visit (INDEPENDENT_AMBULATORY_CARE_PROVIDER_SITE_OTHER): Payer: 59 | Admitting: Family Medicine

## 2017-11-20 VITALS — BP 145/71 | HR 62 | Ht 65.0 in | Wt 196.0 lb

## 2017-11-20 DIAGNOSIS — E669 Obesity, unspecified: Secondary | ICD-10-CM | POA: Diagnosis not present

## 2017-11-20 DIAGNOSIS — I10 Essential (primary) hypertension: Secondary | ICD-10-CM | POA: Diagnosis not present

## 2017-11-20 DIAGNOSIS — Z6833 Body mass index (BMI) 33.0-33.9, adult: Secondary | ICD-10-CM

## 2017-11-20 DIAGNOSIS — Z6832 Body mass index (BMI) 32.0-32.9, adult: Secondary | ICD-10-CM | POA: Diagnosis not present

## 2017-11-20 MED ORDER — NALTREXONE-BUPROPION HCL ER 8-90 MG PO TB12: 2.0000 | ORAL_TABLET | Freq: Two times a day (BID) | ORAL | 0 refills | Status: DC

## 2017-11-21 NOTE — Progress Notes (Signed)
Office: [phone]  /  Fax: [phone]   HPI:   Chief Complaint: OBESITY Brenda Russo is here to discuss her progress with her obesity treatment plan. She is on the keep a food journal with 1100-1300 calories and 80+ grams of protein daily and is following her eating plan approximately 50 % of the time. She states she is walking 1600 steps 7 times per week. Brenda Russo is doing better weight weight loss even with increased celebration eating for her birthday, where she states she ate cake for "breakfast, lunch, and dinner". She notes feeling more satisfied with her food choices. Her weight is 196 lb (88.9 kg) today and has had a weight loss of 2 pounds over a period of 2 weeks since her last visit. She has lost 4 lbs since starting treatment with [name].  Hypertension Brenda Russo is a 62 y.o. female with hypertension. Brenda Russo is on Hyzaar, her blood pressure is elevated today but normally well controlled. She denies chest pain, acute pain problems, or headaches. She is working weight loss to help control her blood pressure with the goal of decreasing her risk of heart attack and stroke. Brenda Russo's blood pressure is not currently controlled.  ALLERGIES: Allergies  Allergen Reactions  . Eggs Or Egg-Derived Products     Shortness of breath, wheezing    MEDICATIONS: Current Outpatient Medications on File Prior to Visit  Medication Sig Dispense Refill  . albuterol (VENTOLIN HFA) 108 (90 Base) MCG/ACT inhaler Inhale 1-2 puffs into the lungs every 4 (four) hours as needed for wheezing or shortness of breath. 54 g 1  . amLODipine (NORVASC) 5 MG tablet Take 1 tablet (5 mg total) by mouth daily. 90 tablet 3  . Ascorbic Acid (VITAMIN C) 1000 MG tablet Take 1,000 mg by mouth daily.    . Coenzyme Q10 (COQ10) 100 MG CAPS Take 1 capsule by mouth daily.    . cyclobenzaprine (FLEXERIL) 5 MG tablet Take 1 tablet (5 mg total) by mouth at bedtime. 10 tablet 0  . Ergocalciferol 2500 units CAPS Take 5,000  Units by mouth daily. 30 capsule 0  . fluticasone (FLONASE) 50 MCG/ACT nasal spray Place 2 sprays into both nostrils daily. 16 g 5  . fluticasone furoate-vilanterol (BREO ELLIPTA) 200-25 MCG/INH AEPB Inhale 1 puff into the lungs daily. 180 each 3  . losartan-hydrochlorothiazide (HYZAAR) 100-12.5 MG tablet Take 1 tablet by mouth daily. 90 tablet 2  . magnesium oxide (MAG-OX) 400 MG tablet 2 tablets daily. 400-800 mg daily    . meloxicam (MOBIC) 7.5 MG tablet Take 1 tablet (7.5 mg total) by mouth daily. 30 tablet 0  . Multiple Minerals-Vitamins (CALCIUM CITRATE PLUS/MAGNESIUM) TABS Take 1 tablet by mouth.    . Multiple Vitamins-Minerals (COMPLETE WOMENS PO) Take 1 capsule by mouth 2 (two) times daily.      . Omega-3 Fatty Acids (FISH OIL) 1000 MG CAPS Take 1 capsule by mouth.     No current facility-administered medications on file prior to visit.     PAST MEDICAL HISTORY: Past Medical History:  Diagnosis Date  . Allergic rhinitis   . Anemia   . Anxiety   . Asthma   . HTN (hypertension)   . Hyperlipidemia    borderline  . Obese   . OSA (obstructive sleep apnea)    mild ,no CPAP  . Overweight(278.02)   . Plantar wart    history  . Prediabetes   . Shortness of breath     PAST SURGICAL HISTORY: Past Surgical  History:  Procedure Laterality Date  . BREAST BIOPSY Right 09/02/2015  . CESAREAN SECTION    . TUBAL LIGATION      SOCIAL HISTORY: Social History   Tobacco Use  . Smoking status: Never Smoker  . Smokeless tobacco: Never Used  Substance Use Topics  . Alcohol use: No  . Drug use: No    FAMILY HISTORY: Family History  Problem Relation Age of Onset  . Heart disease Mother        pacemaker   . Hypertension Mother   . Hyperlipidemia Mother   . Obesity Mother   . Asthma Father   . Pancreatic cancer Brother   . Breast cancer Maternal Aunt   . Colon cancer Neg Hx     ROS: Review of Systems  Constitutional: Positive for weight loss.  Cardiovascular: Negative  for chest pain.  Neurological: Negative for headaches.    PHYSICAL EXAM: Blood pressure (!) 145/71, pulse 62, height 5\' 5"  (1.651 m), weight 196 lb (88.9 kg), SpO2 98 %. Body mass index is 32.62 kg/m. Physical Exam  Constitutional: She is oriented to person, place, and time. She appears well-developed and well-nourished.  Cardiovascular: Normal rate.  Pulmonary/Chest: Effort normal.  Musculoskeletal: Normal range of motion.  Neurological: She is oriented to person, place, and time.  Skin: Skin is warm and dry.  Psychiatric: She has a normal mood and affect. Her behavior is normal.  Vitals reviewed.   RECENT LABS AND TESTS: BMET    Component Value Date/Time   NA 140 10/10/2017 1040   K 4.5 10/10/2017 1040   CL 102 10/10/2017 1040   CO2 26 10/10/2017 1040   GLUCOSE 93 10/10/2017 1040   GLUCOSE 100 (H) 06/03/2015 0747   BUN 13 10/10/2017 1040   CREATININE 0.74 10/10/2017 1040   CALCIUM 9.1 10/10/2017 1040   GFRNONAA 88 10/10/2017 1040   GFRAA 102 10/10/2017 1040   Lab Results  Component Value Date   HGBA1C 5.5 10/10/2017   HGBA1C 5.6 05/09/2017   HGBA1C 5.6 12/06/2016   HGBA1C 5.6 05/17/2016   HGBA1C 5.9 09/22/2015   Lab Results  Component Value Date   INSULIN 6.1 10/10/2017   INSULIN 6.1 05/09/2017   INSULIN 11.2 12/06/2016   INSULIN 9.8 05/17/2016   CBC    Component Value Date/Time   WBC 6.3 05/09/2017 0917   WBC 6.5 11/13/2012 1003   RBC 4.73 05/09/2017 0917   RBC 5.03 11/13/2012 1003   HGB 14.0 05/09/2017 0917   HCT 42.8 05/09/2017 0917   PLT 315 05/09/2017 0917   MCV 91 05/09/2017 0917   MCH 29.6 05/09/2017 0917   MCH 26.9 (A) 05/20/2012 1404   MCHC 32.7 05/09/2017 0917   MCHC 33.5 11/13/2012 1003   RDW 13.3 05/09/2017 0917   LYMPHSABS 1.8 05/09/2017 0917   MONOABS 0.4 11/13/2012 1003   EOSABS 0.1 05/09/2017 0917   BASOSABS 0.0 05/09/2017 0917   Iron/TIBC/Ferritin/ %Sat    Component Value Date/Time   IRON 88 05/20/2012 1422   TIBC 303  05/20/2012 1422   FERRITIN 80 05/20/2012 1422   IRONPCTSAT 29 05/20/2012 1422   Lipid Panel     Component Value Date/Time   CHOL 181 05/09/2017 0917   TRIG 39 05/09/2017 0917   HDL 67 05/09/2017 0917   CHOLHDL 2.7 05/09/2017 0917   CHOLHDL 3 06/03/2015 0747   VLDL 9.0 06/03/2015 0747   LDLCALC 106 (H) 05/09/2017 0917   Hepatic Function Panel     Component Value Date/Time  PROT 7.0 10/10/2017 1040   ALBUMIN 4.0 10/10/2017 1040   AST 22 10/10/2017 1040   ALT 19 10/10/2017 1040   ALKPHOS 92 10/10/2017 1040   BILITOT 0.4 10/10/2017 1040   BILIDIR 0.1 11/13/2012 1003      Component Value Date/Time   TSH 2.460 05/09/2017 0917   TSH 1.770 05/17/2016 0949   TSH 1.23 11/13/2012 1003    ASSESSMENT AND PLAN: Essential hypertension  Class 1 obesity with serious comorbidity and body mass index (BMI) of 32.0 to 32.9 in adult, unspecified obesity type  Class 1 obesity with serious comorbidity and body mass index (BMI) of 33.0 to 33.9 in adult, unspecified obesity type - Plan: Naltrexone-buPROPion HCl ER 8-90 MG TB12  PLAN:  Hypertension We discussed sodium restriction, working on healthy weight loss, and a regular exercise program as the means to achieve improved blood pressure control. Emonie agreed with this plan and agreed to follow up as directed. We will continue to monitor her blood pressure as well as her progress with the above lifestyle modifications. Brenda Russo will continue diet and she agrees to continue Hyzaar and will watch for signs of hypotension as she continues her lifestyle modifications. Brenda Russo agrees to follow up with our clinic in 3 weeks and we will recheck blood pressure at that time.  I spent > than 50% of the 15 minute visit on counseling as documented in the note.  Obesity Brenda Russo is currently in the action stage of change. As such, her goal is to continue with weight loss efforts She has agreed to keep a food journal with 1100-1300 calories and 80+  grams of protein daily Brenda Russo has been instructed to work up to a goal of 150 minutes of combined cardio and strengthening exercise per week for weight loss and overall health benefits. We discussed the following Behavioral Modification Strategies today: increasing lean protein intake and decreasing simple carbohydrates  We discussed various medication options to help Brenda Russo with her weight loss efforts and we both agreed to continue Contrave 8-90 mg 2 tablets BID #120 and we will refill for 1 month.  Brenda Russo has agreed to follow up with our clinic in 3 weeks. She was informed of the importance of frequent follow up visits to maximize her success with intensive lifestyle modifications for her multiple health conditions.   OBESITY BEHAVIORAL INTERVENTION VISIT  Today's visit was # 32  Starting weight: 200 lbs Starting date: 05/17/16 Today's weight : 196 lbs Today's date: 11/20/2017 Total lbs lost to date: 4    ASK: We discussed the diagnosis of obesity with Brenda Russo today and Brenda Russo agreed to give [name] permission to discuss obesity behavioral modification therapy today.  ASSESS: Brenda Russo has the diagnosis of obesity and her BMI today is 32.62 Brenda Russo is in the action stage of change   ADVISE: Brenda Russo was educated on the multiple health risks of obesity as well as the benefit of weight loss to improve her health. She was advised of the need for long term treatment and the importance of lifestyle modifications to improve her current health and to decrease her risk of future health problems.  AGREE: Multiple dietary modification options and treatment options were discussed and  Brenda Russo agreed to follow the recommendations documented in the above note.  ARRANGE: Brenda Russo was educated on the importance of frequent visits to treat obesity as outlined per CMS and USPSTF guidelines and agreed to schedule her next follow up appointment today.  Wilhemena Durie, am acting as  transcriptionist for Dennard Nip, MD  I have reviewed the above documentation for accuracy and completeness, and I agree with the above. -Dennard Nip, MD

## 2017-11-24 MED FILL — CONTRAVE ER 8-90 MG TABLET: 8-90 | 30 days supply | Qty: 120 | Fill #0

## 2017-12-08 ENCOUNTER — Other Ambulatory Visit (INDEPENDENT_AMBULATORY_CARE_PROVIDER_SITE_OTHER): Payer: Self-pay | Admitting: Family Medicine

## 2017-12-08 ENCOUNTER — Encounter (INDEPENDENT_AMBULATORY_CARE_PROVIDER_SITE_OTHER): Payer: Self-pay | Admitting: Family Medicine

## 2017-12-08 DIAGNOSIS — Z6833 Body mass index (BMI) 33.0-33.9, adult: Principal | ICD-10-CM

## 2017-12-08 DIAGNOSIS — E669 Obesity, unspecified: Secondary | ICD-10-CM

## 2017-12-11 NOTE — Telephone Encounter (Signed)
This should have been E-rx to her pharmacy

## 2017-12-13 ENCOUNTER — Ambulatory Visit (INDEPENDENT_AMBULATORY_CARE_PROVIDER_SITE_OTHER): Payer: 59 | Admitting: Family Medicine

## 2017-12-13 VITALS — BP 120/79 | HR 65 | Ht 65.0 in | Wt 196.0 lb

## 2017-12-13 DIAGNOSIS — I10 Essential (primary) hypertension: Secondary | ICD-10-CM

## 2017-12-13 DIAGNOSIS — E669 Obesity, unspecified: Secondary | ICD-10-CM

## 2017-12-13 DIAGNOSIS — Z6832 Body mass index (BMI) 32.0-32.9, adult: Secondary | ICD-10-CM

## 2017-12-13 MED ORDER — NALTREXONE-BUPROPION HCL ER 8-90 MG PO TB12: 2.0000 | ORAL_TABLET | Freq: Two times a day (BID) | ORAL | 0 refills | Status: DC

## 2017-12-14 NOTE — Progress Notes (Signed)
Office: [phone]  /  Fax: [phone]   HPI:   Chief Complaint: OBESITY Brenda Russo is here to discuss her progress with her obesity treatment plan. She is on the keep a food journal with 1100-1300 calories and 80+ grams of protein daily and is following her eating plan approximately 70 % of the time. She states she is walking and exercising with exercise video for 30-60 minutes 2 times per week. Tayra states she is doing well with journaling breakfast and lunch, but she isn't journaling dinner as she knows she isn't eating well for this meal. She continues to maintain weight.  Her weight is 196 lb (88.9 kg) today and has not lost weight since her last visit. She has lost 4 lbs since starting treatment with [name].  Hypertension [name] is a 62 y.o. female with hypertension. Lashanna's blood pressure is stable on medications. She denies chest pain or headaches. She is struggling with her diet and is working weight loss to help control her blood pressure with the goal of decreasing her risk of heart attack and stroke. Hanley's blood pressure is currently controlled.  ALLERGIES: Allergies  Allergen Reactions  . Eggs Or Egg-Derived Products     Shortness of breath, wheezing    MEDICATIONS: Current Outpatient Medications on File Prior to Visit  Medication Sig Dispense Refill  . albuterol (VENTOLIN HFA) 108 (90 Base) MCG/ACT inhaler Inhale 1-2 puffs into the lungs every 4 (four) hours as needed for wheezing or shortness of breath. 54 g 1  . amLODipine (NORVASC) 5 MG tablet Take 1 tablet (5 mg total) by mouth daily. 90 tablet 3  . Ascorbic Acid (VITAMIN C) 1000 MG tablet Take 1,000 mg by mouth daily.    . Coenzyme Q10 (COQ10) 100 MG CAPS Take 1 capsule by mouth daily.    . cyclobenzaprine (FLEXERIL) 5 MG tablet Take 1 tablet (5 mg total) by mouth at bedtime. 10 tablet 0  . Ergocalciferol 2500 units CAPS Take 5,000 Units by mouth daily. 30 capsule 0  . fluticasone (FLONASE) 50  MCG/ACT nasal spray Place 2 sprays into both nostrils daily. 16 g 5  . fluticasone furoate-vilanterol (BREO ELLIPTA) 200-25 MCG/INH AEPB Inhale 1 puff into the lungs daily. 180 each 3  . losartan-hydrochlorothiazide (HYZAAR) 100-12.5 MG tablet Take 1 tablet by mouth daily. 90 tablet 2  . magnesium oxide (MAG-OX) 400 MG tablet 2 tablets daily. 400-800 mg daily    . meloxicam (MOBIC) 7.5 MG tablet Take 1 tablet (7.5 mg total) by mouth daily. 30 tablet 0  . Multiple Minerals-Vitamins (CALCIUM CITRATE PLUS/MAGNESIUM) TABS Take 1 tablet by mouth.    . Multiple Vitamins-Minerals (COMPLETE WOMENS PO) Take 1 capsule by mouth 2 (two) times daily.      . Omega-3 Fatty Acids (FISH OIL) 1000 MG CAPS Take 1 capsule by mouth.     No current facility-administered medications on file prior to visit.     PAST MEDICAL HISTORY: Past Medical History:  Diagnosis Date  . Allergic rhinitis   . Anemia   . Anxiety   . Asthma   . HTN (hypertension)   . Hyperlipidemia    borderline  . Obese   . OSA (obstructive sleep apnea)    mild ,no CPAP  . Overweight(278.02)   . Plantar wart    history  . Prediabetes   . Shortness of breath     PAST SURGICAL HISTORY: Past Surgical History:  Procedure Laterality Date  . BREAST BIOPSY Right 09/02/2015  .  CESAREAN SECTION    . TUBAL LIGATION      SOCIAL HISTORY: Social History   Tobacco Use  . Smoking status: Never Smoker  . Smokeless tobacco: Never Used  Substance Use Topics  . Alcohol use: No  . Drug use: No    FAMILY HISTORY: Family History  Problem Relation Age of Onset  . Heart disease Mother        pacemaker   . Hypertension Mother   . Hyperlipidemia Mother   . Obesity Mother   . Asthma Father   . Pancreatic cancer Brother   . Breast cancer Maternal Aunt   . Colon cancer Neg Hx     ROS: Review of Systems  Constitutional: Negative for weight loss.  Cardiovascular: Negative for chest pain.  Neurological: Negative for headaches.     PHYSICAL EXAM: Blood pressure 120/79, pulse 65, height 5\' 5"  (1.651 m), weight 196 lb (88.9 kg), SpO2 98 %. Body mass index is 32.62 kg/m. Physical Exam  Constitutional: She is oriented to person, place, and time. She appears well-developed and well-nourished.  Cardiovascular: Normal rate.  Pulmonary/Chest: Effort normal.  Musculoskeletal: Normal range of motion.  Neurological: She is oriented to person, place, and time.  Skin: Skin is warm and dry.  Psychiatric: She has a normal mood and affect. Her behavior is normal.  Vitals reviewed.   RECENT LABS AND TESTS: BMET    Component Value Date/Time   NA 140 10/10/2017 1040   K 4.5 10/10/2017 1040   CL 102 10/10/2017 1040   CO2 26 10/10/2017 1040   GLUCOSE 93 10/10/2017 1040   GLUCOSE 100 (H) 06/03/2015 0747   BUN 13 10/10/2017 1040   CREATININE 0.74 10/10/2017 1040   CALCIUM 9.1 10/10/2017 1040   GFRNONAA 88 10/10/2017 1040   GFRAA 102 10/10/2017 1040   Lab Results  Component Value Date   HGBA1C 5.5 10/10/2017   HGBA1C 5.6 05/09/2017   HGBA1C 5.6 12/06/2016   HGBA1C 5.6 05/17/2016   HGBA1C 5.9 09/22/2015   Lab Results  Component Value Date   INSULIN 6.1 10/10/2017   INSULIN 6.1 05/09/2017   INSULIN 11.2 12/06/2016   INSULIN 9.8 05/17/2016   CBC    Component Value Date/Time   WBC 6.3 05/09/2017 0917   WBC 6.5 11/13/2012 1003   RBC 4.73 05/09/2017 0917   RBC 5.03 11/13/2012 1003   HGB 14.0 05/09/2017 0917   HCT 42.8 05/09/2017 0917   PLT 315 05/09/2017 0917   MCV 91 05/09/2017 0917   MCH 29.6 05/09/2017 0917   MCH 26.9 (A) 05/20/2012 1404   MCHC 32.7 05/09/2017 0917   MCHC 33.5 11/13/2012 1003   RDW 13.3 05/09/2017 0917   LYMPHSABS 1.8 05/09/2017 0917   MONOABS 0.4 11/13/2012 1003   EOSABS 0.1 05/09/2017 0917   BASOSABS 0.0 05/09/2017 0917   Iron/TIBC/Ferritin/ %Sat    Component Value Date/Time   IRON 88 05/20/2012 1422   TIBC 303 05/20/2012 1422   FERRITIN 80 05/20/2012 1422   IRONPCTSAT 29  05/20/2012 1422   Lipid Panel     Component Value Date/Time   CHOL 181 05/09/2017 0917   TRIG 39 05/09/2017 0917   HDL 67 05/09/2017 0917   CHOLHDL 2.7 05/09/2017 0917   CHOLHDL 3 06/03/2015 0747   VLDL 9.0 06/03/2015 0747   LDLCALC 106 (H) 05/09/2017 0917   Hepatic Function Panel     Component Value Date/Time   PROT 7.0 10/10/2017 1040   ALBUMIN 4.0 10/10/2017 1040  AST 22 10/10/2017 1040   ALT 19 10/10/2017 1040   ALKPHOS 92 10/10/2017 1040   BILITOT 0.4 10/10/2017 1040   BILIDIR 0.1 11/13/2012 1003      Component Value Date/Time   TSH 2.460 05/09/2017 0917   TSH 1.770 05/17/2016 0949   TSH 1.23 11/13/2012 1003    ASSESSMENT AND PLAN: Essential hypertension  Class 1 obesity with serious comorbidity and body mass index (BMI) of 32.0 to 32.9 in adult, unspecified obesity type - Plan: Naltrexone-buPROPion HCl ER 8-90 MG TB12  PLAN:  Hypertension We discussed sodium restriction, working on healthy weight loss, and a regular exercise program as the means to achieve improved blood pressure control. Neya agreed with this plan and agreed to follow up as directed. We will continue to monitor her blood pressure as well as her progress with the above lifestyle modifications. Ameenah agrees to get back to diet, weight loss, decrease Na+, and she agrees to continue her medications. She will watch for signs of hypotension as she continues her lifestyle modifications. Refugia agrees to follow up with our clinic in 4 weeks.  I spent > than 50% of the 15 minute visit on counseling as documented in the note.  Obesity Amye is currently in the action stage of change. As such, her goal is to continue with weight loss efforts She has agreed to keep a food journal with 1100-1300 calories and 80+ grams of protein daily Mauricia has been instructed to work up to a goal of 150 minutes of combined cardio and strengthening exercise per week for weight loss and overall health  benefits. We discussed the following Behavioral Modification Strategies today: decreasing sodium intake, holiday eating strategies, travel eating strategies, and keep a strict food journal We discussed various medication options to help Katoya with her weight loss efforts and we both agreed to continue Saxenda 8-90 mg 2 tablets BID #120 and we will refill for 1 month.   Hooria has agreed to follow up with our clinic in 4 weeks. She was informed of the importance of frequent follow up visits to maximize her success with intensive lifestyle modifications for her multiple health conditions.   OBESITY BEHAVIORAL INTERVENTION VISIT  Today's visit was # 3   Starting weight: 200 lbs Starting date: 05/17/16 Today's weight : 196 lbs  Today's date: 12/13/2017 Total lbs lost to date: 4    ASK: We discussed the diagnosis of obesity with [name] today and Tahisha agreed to give [name] permission to discuss obesity behavioral modification therapy today.  ASSESS: Iram has the diagnosis of obesity and her BMI today is 32.62 Khristina is in the action stage of change   ADVISE: Daneesha was educated on the multiple health risks of obesity as well as the benefit of weight loss to improve her health. She was advised of the need for long term treatment and the importance of lifestyle modifications to improve her current health and to decrease her risk of future health problems.  AGREE: Multiple dietary modification options and treatment options were discussed and  Jatziry agreed to follow the recommendations documented in the above note.  ARRANGE: Aroura was educated on the importance of frequent visits to treat obesity as outlined per CMS and USPSTF guidelines and agreed to schedule her next follow up appointment today.  I, Trixie Dredge, am acting as transcriptionist for Dennard Nip, MD  I have reviewed the above documentation for accuracy and completeness, and I agree with the  above. -Dennard Nip, MD

## 2017-12-19 MED FILL — CONTRAVE ER 8-90 MG TABLET: 8-90 | 30 days supply | Qty: 120 | Fill #0

## 2017-12-22 MED FILL — AMLODIPINE BESYLATE 5 MG TA: 5 | 90 days supply | Qty: 90 | Fill #2

## 2017-12-22 MED FILL — LOSARTAN-HCTZ 100-12.5 MG T: 100-12.5 | 90 days supply | Qty: 90 | Fill #2

## 2017-12-22 MED FILL — BREO ELLIPTA 200-25 MCG INH: 200-25 | 90 days supply | Qty: 180 | Fill #2

## 2018-01-02 ENCOUNTER — Ambulatory Visit: Payer: 59 | Admitting: Physician Assistant

## 2018-01-02 ENCOUNTER — Encounter: Payer: Self-pay | Admitting: Physician Assistant

## 2018-01-02 VITALS — BP 163/94 | HR 83 | Temp 100.0°F | Resp 16 | Ht 64.5 in | Wt 197.0 lb

## 2018-01-02 DIAGNOSIS — R6889 Other general symptoms and signs: Secondary | ICD-10-CM | POA: Diagnosis not present

## 2018-01-02 DIAGNOSIS — R05 Cough: Secondary | ICD-10-CM

## 2018-01-02 DIAGNOSIS — R059 Cough, unspecified: Secondary | ICD-10-CM

## 2018-01-02 LAB — POCT INFLUENZA A/B
Influenza A, POC: NEGATIVE
Influenza B, POC: NEGATIVE

## 2018-01-02 MED ORDER — OSELTAMIVIR PHOSPHATE 75 MG PO CAPS: 75.0000 mg | ORAL_CAPSULE | Freq: Two times a day (BID) | ORAL | 0 refills | Status: DC

## 2018-01-02 MED ORDER — BALOXAVIR MARBOXIL(80 MG DOSE) 2 X 40 MG PO TBPK: 80.0000 mg | ORAL_TABLET | Freq: Once | ORAL | 0 refills | Status: AC

## 2018-01-02 MED ORDER — HYDROCOD POLST-CPM POLST ER 10-8 MG/5ML PO SUER: 5.0000 mL | Freq: Two times a day (BID) | ORAL | 0 refills | Status: DC | PRN

## 2018-01-02 MED ORDER — BENZONATATE 100 MG PO CAPS: 100.0000 mg | ORAL_CAPSULE | Freq: Three times a day (TID) | ORAL | 0 refills | Status: DC | PRN

## 2018-01-02 MED FILL — OSELTAMIVIR PHOSPHATE 75 MG: 75 | 5 days supply | Qty: 10 | Fill #0

## 2018-01-02 MED FILL — BENZONATATE 100 MG CAPS: 100 | 6 days supply | Qty: 40 | Fill #0

## 2018-01-02 MED FILL — HYDROCODONE-CHLORPHEN ER SU: 10-8 | 10 days supply | Qty: 100 | Fill #0

## 2018-01-02 NOTE — Progress Notes (Signed)
Brenda Russo   

## 2018-01-02 NOTE — Progress Notes (Signed)
Brenda Russo  MRN: [id number] DOB: 1956-01-18  PCP: Colon Branch, MD  Subjective:  Pt is a 62 year old female who presents to clinic for cough.  Endorses chills, productive cough, body aches and HA x <2 days.  Symptoms started last night.  She did get her flu shot.  She was around her grandson who was sick with the flu last week.   Review of Systems  Constitutional: Positive for chills and fatigue. Negative for diaphoresis and fever.  HENT: Negative for congestion, sinus pressure, sinus pain, sneezing and sore throat.   Respiratory: Positive for cough. Negative for chest tightness, shortness of breath and wheezing.   Neurological: Positive for headaches. Negative for dizziness and light-headedness.    Patient Active Problem List   Diagnosis Date Noted  . 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 Vitamin D deficiency 12/06/2016  . High serum vitamin B12 12/06/2016  . Depression 08/30/2016  . Class 1 obesity without serious comorbidity with body mass index (BMI) of 31.0 to 31.9 in adult 08/30/2016  . Bradycardia 04/13/2016  . PVC's (premature ventricular contractions) 04/13/2016  . HH (hiatus hernia) 12/24/2015  . Laryngopharyngeal reflux (LPR) 12/24/2015  . PCP NOTES >>>>>>>>>>>>>>>>>>>> 05/20/2015  . Annual physical exam 04/29/2014  . Back pain 11/06/2013  . OSA (obstructive sleep apnea) 09/25/2012  . DJD (degenerative joint disease) 07/02/2010  . Essential hypertension 05/21/2009  . HYPERCHOLESTEROLEMIA, BORDERLINE 07/27/2007  . ANXIETY 07/27/2007  . Allergic rhinitis 07/27/2007  . Asthma 01/11/2007    Current Outpatient Medications on File Prior to Visit  Medication Sig Dispense Refill  . amLODipine (NORVASC) 5 MG tablet Take 1 tablet (5 mg total) by mouth daily. 90 tablet 3  . Ascorbic Acid (VITAMIN C) 1000 MG tablet Take 1,000 mg by mouth daily.    . Coenzyme Q10 (COQ10) 100 MG CAPS Take 1 capsule by mouth daily.    . Ergocalciferol 2500 units CAPS Take 5,000 Units by  mouth daily. 30 capsule 0  . fluticasone (FLONASE) 50 MCG/ACT nasal spray Place 2 sprays into both nostrils daily. 16 g 5  . fluticasone furoate-vilanterol (BREO ELLIPTA) 200-25 MCG/INH AEPB Inhale 1 puff into the lungs daily. 180 each 3  . losartan-hydrochlorothiazide (HYZAAR) 100-12.5 MG tablet Take 1 tablet by mouth daily. 90 tablet 2  . magnesium oxide (MAG-OX) 400 MG tablet 2 tablets daily. 400-800 mg daily    . Multiple Minerals-Vitamins (CALCIUM CITRATE PLUS/MAGNESIUM) TABS Take 1 tablet by mouth.    . Multiple Vitamins-Minerals (COMPLETE WOMENS PO) Take 1 capsule by mouth 2 (two) times daily.      . Naltrexone-buPROPion HCl ER 8-90 MG TB12 Take 2 tablets by mouth 2 (two) times daily. 120 tablet 0  . Omega-3 Fatty Acids (FISH OIL) 1000 MG CAPS Take 1 capsule by mouth.    Marland Kitchen albuterol (VENTOLIN HFA) 108 (90 Base) MCG/ACT inhaler Inhale 1-2 puffs into the lungs every 4 (four) hours as needed for wheezing or shortness of breath. (Patient not taking: Reported on 01/02/2018) 54 g 1  . cyclobenzaprine (FLEXERIL) 5 MG tablet Take 1 tablet (5 mg total) by mouth at bedtime. (Patient not taking: Reported on 01/02/2018) 10 tablet 0  . meloxicam (MOBIC) 7.5 MG tablet Take 1 tablet (7.5 mg total) by mouth daily. (Patient not taking: Reported on 01/02/2018) 30 tablet 0   No current facility-administered medications on file prior to visit.     Allergies  Allergen Reactions  . Eggs Or Egg-Derived Products     Shortness of  breath, wheezing     Objective:  BP (!) 163/94 (BP Location: Right Arm, Patient Position: Sitting, Cuff Size: Normal)   Pulse 83   Temp 100 F (37.8 C) (Oral)   Resp 16   Ht 5' 4.5" (1.638 m)   Wt 197 lb (89.4 kg)   SpO2 94%   BMI 33.29 kg/m   Physical Exam  Constitutional: She is oriented to person, place, and time. No distress.  HENT:  Right Ear: Tympanic membrane normal.  Left Ear: Tympanic membrane normal.  Nose: Mucosal edema present. No rhinorrhea. Right sinus  exhibits no maxillary sinus tenderness and no frontal sinus tenderness. Left sinus exhibits no maxillary sinus tenderness and no frontal sinus tenderness.  Mouth/Throat: Oropharynx is clear and moist and mucous membranes are normal.  Cardiovascular: Normal rate, regular rhythm and normal heart sounds.  Pulmonary/Chest: Effort normal and breath sounds normal. No respiratory distress. She has no wheezes. She has no rales.  Neurological: She is alert and oriented to person, place, and time.  Skin: Skin is warm and dry.  Psychiatric: Judgment normal.  Vitals reviewed.   Results for orders placed or performed in visit on 01/02/18  POCT Influenza A/B  Result Value Ref Range   Influenza A, POC Negative Negative   Influenza B, POC Negative Negative   Assessment and Plan :  1. Flu-like symptoms - pt presents c/o flu-like symptoms x < 48 hours. Suspect false negative rapid flu, especially considering +flu exposure. She will discuss with pharmacist about cost of antivirals. RTC if no improvement.  - POCT Influenza A/B - Baloxavir Marboxil,80 MG Dose, (XOFLUZA) 2 x 40 MG TBPK; Take 80 mg by mouth once for 1 dose.  Dispense: 2 each; Refill: 0 - oseltamivir (TAMIFLU) 75 MG capsule; Take 1 capsule (75 mg total) by mouth 2 (two) times daily.  Dispense: 10 capsule; Refill: 0  2. Cough - chlorpheniramine-HYDROcodone (TUSSIONEX PENNKINETIC ER) 10-8 MG/5ML SUER; Take 5 mLs by mouth every 12 (twelve) hours as needed for cough.  Dispense: 100 mL; Refill: 0 - benzonatate (TESSALON) 100 MG capsule; Take 1-2 capsules (100-200 mg total) by mouth 3 (three) times daily as needed for cough.  Dispense: 40 capsule; Refill: 0    Mercer Pod, PA-C  [facility] 01/02/2018 4:14 PM  Please note: Portions of this report may have been transcribed using dragon voice recognition software. Every effort was made to ensure accuracy; however, inadvertent computerized transcription errors  may be present.

## 2018-01-02 NOTE — Patient Instructions (Addendum)
    Discuss with your pharmacist pricing for Xofluza (the newer antiviral medication) vs Tamiflu (more affordable)   Tessalon is for cough during the day. This should not make you drowsy.  Tussionex is to help your cough at night. This will make you drowsy. Do not take this with any other medications that make your drowsy. If you need it, start taking over the counter Mucinex will help thin out your mucus to help you clear it out. Drink plenty of water while taking this medication.   Stay well hydrated. Get lost of rest. Wash your hands often.   -Foods that can help speed recovery: honey, garlic, chicken soup, elderberries, green tea.  -Supplements that can help speed recovery: vitamin C, zinc, elderberry extract, quercetin, ginseng, selenium -Supplement with prebiotics and probiotics:   For sore throat try using a honey-based tea. Use 3 teaspoons of honey with juice squeezed from half lemon. Place shaved pieces of ginger into 1/2-1 cup of water and warm over stove top. Then mix the ingredients and repeat every 4 hours as needed.  Cough Syrup Recipe: Sweet Lemon & Honey Thyme  Ingredients . a handful of fresh thyme sprigs   . 1 pint of water (2 cups)  . 1/2 cup honey (raw is best, but regular will do)  . 1/2 lemon chopped Instructions 1. Place the lemon in the pint jar and cover with the honey. The honey will macerate the lemons and draw out liquids which taste so delicious! 2. Meanwhile, toss the thyme leaves into a saucepan and cover them with the water. 3. Bring the water to a gentle simmer and reduce it to half, about a cup of tea. 4. When the tea is reduced and cooled a bit, strain the sprigs & leaves, add it into the pint jar and stir it well. 5. Give it a shake and use a spoonful as needed. 6. Store your homemade cough syrup in the refrigerator for about a month.  What causes a cough?  In adults, common causes of a cough include: ?An infection of the airways or lungs (such as the  common cold) ?Postnasal drip - Postnasal drip is when mucus from the nose drips down or flows along the back of the throat. Postnasal drip can happen when people have: .A cold .Allergies .A sinus infection - The sinuses are hollow areas in the bones of the face that open into the nose. ?Lung conditions, like asthma and chronic obstructive pulmonary disease (COPD) - Both of these conditions can make it hard to breathe. COPD is usually caused by smoking. ?Acid reflux - Acid reflux is when the acid that is normally in your stomach backs up into your esophagus (the tube that carries food from your mouth to your stomach). ?A side effect from blood pressure medicines called "ACE inhibitors" ?Smoking cigarettes  Is there anything I can do on my own to get rid of my cough?  Yes. To help get rid of your cough, you can: ?Use a humidifier in your bedroom ?Use an over-the-counter cough medicine, or suck on cough drops or hard candy ?Stop smoking, if you smoke ?If you have allergies, avoid the things you are allergic to (like pollen, dust, animals, or mold) If you have acid reflux, your doctor or nurse will tell you which lifestyle changes can help reduce symptoms.

## 2018-01-10 ENCOUNTER — Ambulatory Visit (INDEPENDENT_AMBULATORY_CARE_PROVIDER_SITE_OTHER): Payer: 59 | Admitting: Family Medicine

## 2018-01-10 ENCOUNTER — Encounter (INDEPENDENT_AMBULATORY_CARE_PROVIDER_SITE_OTHER): Payer: Self-pay | Admitting: Family Medicine

## 2018-01-10 VITALS — BP 111/74 | HR 85 | Temp 97.9°F | Ht 65.0 in | Wt 194.0 lb

## 2018-01-10 DIAGNOSIS — Z6832 Body mass index (BMI) 32.0-32.9, adult: Secondary | ICD-10-CM

## 2018-01-10 DIAGNOSIS — I10 Essential (primary) hypertension: Secondary | ICD-10-CM

## 2018-01-10 DIAGNOSIS — E669 Obesity, unspecified: Secondary | ICD-10-CM

## 2018-01-15 ENCOUNTER — Encounter (INDEPENDENT_AMBULATORY_CARE_PROVIDER_SITE_OTHER): Payer: Self-pay | Admitting: Family Medicine

## 2018-01-15 MED ORDER — NALTREXONE-BUPROPION HCL ER 8-90 MG PO TB12: 2.0000 | ORAL_TABLET | Freq: Two times a day (BID) | ORAL | 0 refills | Status: DC

## 2018-01-15 NOTE — Progress Notes (Signed)
Office: [phone]  /  Fax: [phone]   HPI:   Chief Complaint: OBESITY Brenda Russo is here to discuss her progress with her obesity treatment plan. She is keeping a food journal with 1100 to 1300 calories and 80+ grams of protein and is following her eating plan approximately 70 % of the time. She states she is walking and doing video exercises 20 minutes 3 times per week. Brenda Russo continues to do well with weight loss and is journaling on and off. Her hunger is controlled, but she notes increased temptations especially at work. She is tolerating Contrave well and her blood pressure is normal. She denies insomnia.  Her weight is 194 lb (88 kg) today and has had a weight loss of 2 pounds over a period of 4 weeks since her last visit. She has lost 6 lbs since starting treatment with [name].  Hypertension Brenda Russo is a 62 y.o. female with hypertension. Brenda Russo is taking Norvasc 5mg  and her blood pressure is currently well controlled. She is working on weight loss to help control her blood pressure with the goal of decreasing her risk of heart attack and stroke. Brenda Russo denies lightheadedness.  ALLERGIES: Allergies  Allergen Reactions  . Eggs Or Egg-Derived Products     Shortness of breath, wheezing    MEDICATIONS: Current Outpatient Medications on File Prior to Visit  Medication Sig Dispense Refill  . albuterol (VENTOLIN HFA) 108 (90 Base) MCG/ACT inhaler Inhale 1-2 puffs into the lungs every 4 (four) hours as needed for wheezing or shortness of breath. 54 g 1  . amLODipine (NORVASC) 5 MG tablet Take 1 tablet (5 mg total) by mouth daily. 90 tablet 3  . Ascorbic Acid (VITAMIN C) 1000 MG tablet Take 1,000 mg by mouth daily.    . benzonatate (TESSALON) 100 MG capsule Take 1-2 capsules (100-200 mg total) by mouth 3 (three) times daily as needed for cough. 40 capsule 0  . chlorpheniramine-HYDROcodone (TUSSIONEX PENNKINETIC ER) 10-8 MG/5ML SUER Take 5 mLs by mouth every 12 (twelve)  hours as needed for cough. 100 mL 0  . Coenzyme Q10 (COQ10) 100 MG CAPS Take 1 capsule by mouth daily.    . cyclobenzaprine (FLEXERIL) 5 MG tablet Take 1 tablet (5 mg total) by mouth at bedtime. 10 tablet 0  . Ergocalciferol 2500 units CAPS Take 5,000 Units by mouth daily. 30 capsule 0  . fluticasone (FLONASE) 50 MCG/ACT nasal spray Place 2 sprays into both nostrils daily. 16 g 5  . fluticasone furoate-vilanterol (BREO ELLIPTA) 200-25 MCG/INH AEPB Inhale 1 puff into the lungs daily. 180 each 3  . losartan-hydrochlorothiazide (HYZAAR) 100-12.5 MG tablet Take 1 tablet by mouth daily. 90 tablet 2  . magnesium oxide (MAG-OX) 400 MG tablet 2 tablets daily. 400-800 mg daily    . meloxicam (MOBIC) 7.5 MG tablet Take 1 tablet (7.5 mg total) by mouth daily. 30 tablet 0  . Multiple Minerals-Vitamins (CALCIUM CITRATE PLUS/MAGNESIUM) TABS Take 1 tablet by mouth.    . Multiple Vitamins-Minerals (COMPLETE WOMENS PO) Take 1 capsule by mouth 2 (two) times daily.      . Naltrexone-buPROPion HCl ER 8-90 MG TB12 Take 2 tablets by mouth 2 (two) times daily. 120 tablet 0  . Omega-3 Fatty Acids (FISH OIL) 1000 MG CAPS Take 1 capsule by mouth.    . oseltamivir (TAMIFLU) 75 MG capsule Take 1 capsule (75 mg total) by mouth 2 (two) times daily. 10 capsule 0   No current facility-administered medications on file prior to  visit.     PAST MEDICAL HISTORY: Past Medical History:  Diagnosis Date  . Allergic rhinitis   . Anemia   . Anxiety   . Asthma   . HTN (hypertension)   . Hyperlipidemia    borderline  . Obese   . OSA (obstructive sleep apnea)    mild ,no CPAP  . Overweight(278.02)   . Plantar wart    history  . Prediabetes   . Shortness of breath     PAST SURGICAL HISTORY: Past Surgical History:  Procedure Laterality Date  . BREAST BIOPSY Right 09/02/2015  . CESAREAN SECTION    . TUBAL LIGATION      SOCIAL HISTORY: Social History   Tobacco Use  . Smoking status: Never Smoker  . Smokeless  tobacco: Never Used  Substance Use Topics  . Alcohol use: No  . Drug use: No    FAMILY HISTORY: Family History  Problem Relation Age of Onset  . Heart disease Mother        pacemaker   . Hypertension Mother   . Hyperlipidemia Mother   . Obesity Mother   . Asthma Father   . Pancreatic cancer Brother   . Breast cancer Maternal Aunt   . Colon cancer Neg Hx     ROS: Review of Systems  Constitutional: Positive for weight loss.  Neurological:       Negative for lightheadedness.  Psychiatric/Brenda: The patient does not have insomnia.     PHYSICAL EXAM: Blood pressure 111/74, pulse 85, temperature 97.9 F (36.6 C), temperature source Oral, height 5\' 5"  (1.651 m), weight 194 lb (88 kg), SpO2 96 %. Body mass index is 32.28 kg/m. Physical Exam  Constitutional: She is oriented to person, place, and time. She appears well-developed and well-nourished.  Cardiovascular: Normal rate.  Pulmonary/Chest: Effort normal.  Musculoskeletal: Normal range of motion.  Neurological: She is oriented to person, place, and time.  Skin: Skin is warm and dry.  Psychiatric: She has a normal mood and affect. Her behavior is normal.  Vitals reviewed.   RECENT LABS AND TESTS: BMET    Component Value Date/Time   NA 140 10/10/2017 1040   K 4.5 10/10/2017 1040   CL 102 10/10/2017 1040   CO2 26 10/10/2017 1040   GLUCOSE 93 10/10/2017 1040   GLUCOSE 100 (H) 06/03/2015 0747   BUN 13 10/10/2017 1040   CREATININE 0.74 10/10/2017 1040   CALCIUM 9.1 10/10/2017 1040   GFRNONAA 88 10/10/2017 1040   GFRAA 102 10/10/2017 1040   Lab Results  Component Value Date   HGBA1C 5.5 10/10/2017   HGBA1C 5.6 05/09/2017   HGBA1C 5.6 12/06/2016   HGBA1C 5.6 05/17/2016   HGBA1C 5.9 09/22/2015   Lab Results  Component Value Date   INSULIN 6.1 10/10/2017   INSULIN 6.1 05/09/2017   INSULIN 11.2 12/06/2016   INSULIN 9.8 05/17/2016   CBC    Component Value Date/Time   WBC 6.3 05/09/2017 0917   WBC 6.5  11/13/2012 1003   RBC 4.73 05/09/2017 0917   RBC 5.03 11/13/2012 1003   HGB 14.0 05/09/2017 0917   HCT 42.8 05/09/2017 0917   PLT 315 05/09/2017 0917   MCV 91 05/09/2017 0917   MCH 29.6 05/09/2017 0917   MCH 26.9 (A) 05/20/2012 1404   MCHC 32.7 05/09/2017 0917   MCHC 33.5 11/13/2012 1003   RDW 13.3 05/09/2017 0917   LYMPHSABS 1.8 05/09/2017 0917   MONOABS 0.4 11/13/2012 1003   EOSABS 0.1 05/09/2017 4709  BASOSABS 0.0 05/09/2017 0917   Iron/TIBC/Ferritin/ %Sat    Component Value Date/Time   IRON 88 05/20/2012 1422   TIBC 303 05/20/2012 1422   FERRITIN 80 05/20/2012 1422   IRONPCTSAT 29 05/20/2012 1422   Lipid Panel     Component Value Date/Time   CHOL 181 05/09/2017 0917   TRIG 39 05/09/2017 0917   HDL 67 05/09/2017 0917   CHOLHDL 2.7 05/09/2017 0917   CHOLHDL 3 06/03/2015 0747   VLDL 9.0 06/03/2015 0747   LDLCALC 106 (H) 05/09/2017 0917   Hepatic Function Panel     Component Value Date/Time   PROT 7.0 10/10/2017 1040   ALBUMIN 4.0 10/10/2017 1040   AST 22 10/10/2017 1040   ALT 19 10/10/2017 1040   ALKPHOS 92 10/10/2017 1040   BILITOT 0.4 10/10/2017 1040   BILIDIR 0.1 11/13/2012 1003      Component Value Date/Time   TSH 2.460 05/09/2017 0917   TSH 1.770 05/17/2016 0949   TSH 1.23 11/13/2012 1003   Results for KAITYLN, KALLSTROM (MRN [id number]) as of 01/15/2018 08:45  Ref. Range 10/10/2017 10:40  Vitamin D, 25-Hydroxy Latest Ref Range: 30.0 - 100.0 ng/mL 85.3   ASSESSMENT AND PLAN: Essential hypertension  Class 1 obesity with serious comorbidity and body mass index (BMI) of 32.0 to 32.9 in adult, unspecified obesity type  PLAN:  Hypertension We discussed sodium restriction, working on healthy weight loss, and a regular exercise program as the means to achieve improved blood pressure control. We will continue to monitor her blood pressure as well as her progress with the above lifestyle modifications. She will continue her diet and Norvasc as prescribed  and will watch for signs of hypotension as she continues her lifestyle modifications. Brenda Russo agreed with this plan and agreed to follow up as directed in 4 weeks.  I spent > than 50% of the 15 minute visit on counseling as documented in the note.  Obesity Brenda Russo is currently in the action stage of change. As such, her goal is to continue with weight loss efforts. She has agreed to keep a food journal with 1100 to 1300 calories and 80+ grams of protein.  Armeda has been instructed to work up to a goal of 150 minutes of combined cardio and strengthening exercise per week for weight loss and overall health benefits. We discussed the following Brenda Modification Strategies today: increasing lean protein intake, decreasing simple carbohydrates, dealing with family or coworker sabotage, and holiday eating strategies.  Brenda Russo agrees to continue Contrave 8-90mg , 2 tablets BID #120 with no refills and she will follow up as directed.  Brenda Russo has agreed to follow up with our clinic in 4 weeks. She was informed of the importance of frequent follow up visits to maximize her success with intensive lifestyle modifications for her multiple health conditions.   OBESITY Brenda INTERVENTION VISIT  Today's visit was # Russo  Starting weight: 200 lbs Starting date: 05/17/16 Today's weight : Weight: 194 lb (88 kg)  Today's date: 01/10/2018 Total lbs lost to date: 6  ASK: We discussed the diagnosis of obesity with Brenda Russo today and Brenda Russo agreed to give [name] permission to discuss obesity Brenda modification therapy today.  ASSESS: Brenda Russo has the diagnosis of obesity and her BMI today is 32.28. Brenda Russo is in the action stage of change.   ADVISE: Brenda Russo was educated on the multiple health risks of obesity as well as the benefit of weight loss to improve her health. She was advised of the  need for long term treatment and the importance of lifestyle modifications to improve  her current health and to decrease her risk of future health problems.  AGREE: Multiple dietary modification options and treatment options were discussed and Brenda Russo agreed to follow the recommendations documented in the above note.  ARRANGE: Brenda Russo was educated on the importance of frequent visits to treat obesity as outlined per CMS and USPSTF guidelines and agreed to schedule her next follow up appointment today.  I, Marcille Blanco, am acting as transcriptionist for Starlyn Skeans, MD  I have reviewed the above documentation for accuracy and completeness, and I agree with the above. -Dennard Nip, MD

## 2018-01-17 ENCOUNTER — Encounter (INDEPENDENT_AMBULATORY_CARE_PROVIDER_SITE_OTHER): Payer: Self-pay | Admitting: Family Medicine

## 2018-01-18 ENCOUNTER — Encounter (INDEPENDENT_AMBULATORY_CARE_PROVIDER_SITE_OTHER): Payer: Self-pay

## 2018-01-18 MED FILL — CONTRAVE ER 8-90 MG TABLET: 8-90 | 30 days supply | Qty: 120 | Fill #0

## 2018-01-18 MED FILL — TRIAZOLAM 0.125 MG TABLET: 0.125 | 1 days supply | Qty: 4 | Fill #0

## 2018-01-24 MED FILL — HYDROCODON-APAP 5-325: 5-325 | 5 days supply | Qty: 30 | Fill #0

## 2018-01-24 MED FILL — CHLORHEXIDINE 0.12% RINSE: 0.12 | 30 days supply | Qty: 473 | Fill #0

## 2018-01-24 MED FILL — AMOXICILLIN 500 MG CAPSULE: 500 | 7 days supply | Qty: 21 | Fill #0

## 2018-02-12 ENCOUNTER — Ambulatory Visit (INDEPENDENT_AMBULATORY_CARE_PROVIDER_SITE_OTHER): Payer: 59 | Admitting: Family Medicine

## 2018-02-12 ENCOUNTER — Encounter (INDEPENDENT_AMBULATORY_CARE_PROVIDER_SITE_OTHER): Payer: Self-pay | Admitting: Family Medicine

## 2018-02-12 VITALS — BP 145/89 | HR 57 | Ht 65.0 in | Wt 195.0 lb

## 2018-02-12 DIAGNOSIS — I10 Essential (primary) hypertension: Secondary | ICD-10-CM

## 2018-02-12 DIAGNOSIS — Z6832 Body mass index (BMI) 32.0-32.9, adult: Secondary | ICD-10-CM | POA: Diagnosis not present

## 2018-02-12 DIAGNOSIS — E669 Obesity, unspecified: Secondary | ICD-10-CM

## 2018-02-12 MED ORDER — LORCASERIN HCL 10 MG PO TABS: 1.0000 | ORAL_TABLET | Freq: Two times a day (BID) | ORAL | 0 refills | Status: DC

## 2018-02-13 NOTE — Progress Notes (Signed)
Office: [phone]  /  Fax: [phone]   HPI:   Chief Complaint: OBESITY Brenda Russo is here to discuss her progress with her obesity treatment plan. She is to keep a food journal with 1100 to 1300 calories and 80+ grams of protein and is following her eating plan approximately 30 to 40 % of the time. She states she is walking 17,000 steps 6 to 7 times per week. Brenda Russo has done well minimizing holiday weight gain. She has been on Contrave, but insurance denied due to not losing enough weight to meet their criteria.  Her weight is 195 lb (88.5 kg) today and has had a weight gain of 1 pound over a period of 5 weeks since her last visit. She has lost 5 lbs since starting treatment with [name].  Hypertension Brenda Russo is a 63 y.o. female with hypertension. Brenda Russo's blood pressure is elevated today at 145/89 and is normally well controlled. She is taking Norvasc 5mg  and Hyzaar 100-12.5mg . She is working on weight loss to help control her blood pressure with the goal of decreasing her risk of heart attack and stroke. Brenda Russo denies chest pain or headache.  ASSESSMENT AND PLAN:  Essential hypertension  Class 1 obesity with serious comorbidity and body mass index (BMI) of 32.0 to 32.9 in adult, unspecified obesity type - Plan: Lorcaserin HCl (BELVIQ) 10 MG TABS  PLAN:  Hypertension We discussed sodium restriction, working on healthy weight loss, and a regular exercise program as the means to achieve improved blood pressure control. We will continue to monitor her blood pressure as well as her progress with the above lifestyle modifications. She will continue her medications as prescribed and will watch for signs of hypotension as she continues her lifestyle modifications. She will continue with her diet, exercise and we will recheck her blood pressure in 2 weeks. Emali agreed with this plan and agreed to follow up as directed.  I spent > than 50% of the 15 minute visit on counseling  as documented in the note.  Obesity Brenda Russo is currently in the action stage of change. As such, her goal is to continue with weight loss efforts. She has agreed to keep a food journal with 1200 calories and 75+ grams of protein.  Brenda Russo has been instructed to work up to a goal of 150 minutes of combined cardio and strengthening exercise per week for weight loss and overall health benefits. We discussed the following Behavioral Modification Strategies today: increasing lean protein intake and work on meal planning and easy cooking plans. We discussed various medication options to help Brenda Russo with her weight loss efforts and we both agreed to start Belviq 10mg  BID #30 with no refills.  [name] has agreed to follow up with our clinic in 2 weeks. She was informed of the importance of frequent follow up visits to maximize her success with intensive lifestyle modifications for her multiple health conditions.  ALLERGIES: Allergies  Allergen Reactions  . Eggs Or Egg-Derived Products     Shortness of breath, wheezing    MEDICATIONS: Current Outpatient Medications on File Prior to Visit  Medication Sig Dispense Refill  . albuterol (VENTOLIN HFA) 108 (90 Base) MCG/ACT inhaler Inhale 1-2 puffs into the lungs every 4 (four) hours as needed for wheezing or shortness of breath. 54 g 1  . amLODipine (NORVASC) 5 MG tablet Take 1 tablet (5 mg total) by mouth daily. 90 tablet 3  . Ascorbic Acid (VITAMIN C) 1000 MG tablet Take 1,000 mg by mouth  daily.    . Coenzyme Q10 (COQ10) 100 MG CAPS Take 1 capsule by mouth daily.    . Ergocalciferol 2500 units CAPS Take 5,000 Units by mouth daily. 30 capsule 0  . fluticasone (FLONASE) 50 MCG/ACT nasal spray Place 2 sprays into both nostrils daily. 16 g 5  . fluticasone furoate-vilanterol (BREO ELLIPTA) 200-25 MCG/INH AEPB Inhale 1 puff into the lungs daily. 180 each 3  . losartan-hydrochlorothiazide (HYZAAR) 100-12.5 MG tablet Take 1 tablet by mouth daily. 90  tablet 2  . magnesium oxide (MAG-OX) 400 MG tablet 2 tablets daily. 400-800 mg daily    . meloxicam (MOBIC) 7.5 MG tablet Take 1 tablet (7.5 mg total) by mouth daily. 30 tablet 0  . Multiple Minerals-Vitamins (CALCIUM CITRATE PLUS/MAGNESIUM) TABS Take 1 tablet by mouth.    . Multiple Vitamins-Minerals (COMPLETE WOMENS PO) Take 1 capsule by mouth 2 (two) times daily.      . Naltrexone-buPROPion HCl ER 8-90 MG TB12 Take 2 tablets by mouth 2 (two) times daily. 120 tablet 0  . Omega-3 Fatty Acids (FISH OIL) 1000 MG CAPS Take 1 capsule by mouth.     No current facility-administered medications on file prior to visit.     PAST MEDICAL HISTORY: Past Medical History:  Diagnosis Date  . Allergic rhinitis   . Anemia   . Anxiety   . Asthma   . HTN (hypertension)   . Hyperlipidemia    borderline  . Obese   . OSA (obstructive sleep apnea)    mild ,no CPAP  . Overweight(278.02)   . Plantar wart    history  . Prediabetes   . Shortness of breath     PAST SURGICAL HISTORY: Past Surgical History:  Procedure Laterality Date  . BREAST BIOPSY Right 09/02/2015  . CESAREAN SECTION    . TUBAL LIGATION      SOCIAL HISTORY: Social History   Tobacco Use  . Smoking status: Never Smoker  . Smokeless tobacco: Never Used  Substance Use Topics  . Alcohol use: No  . Drug use: No    FAMILY HISTORY: Family History  Problem Relation Age of Onset  . Heart disease Mother        pacemaker   . Hypertension Mother   . Hyperlipidemia Mother   . Obesity Mother   . Asthma Father   . Pancreatic cancer Brother   . Breast cancer Maternal Aunt   . Colon cancer Neg Hx     ROS: Review of Systems  Constitutional: Negative for weight loss.  Cardiovascular: Negative for chest pain.  Neurological: Negative for headaches.   PHYSICAL EXAM: Blood pressure (!) 145/89, pulse (!) 57, height 5\' 5"  (1.651 m), weight 195 lb (88.5 kg), SpO2 98 %. Body mass index is 32.45 kg/m. Physical Exam Vitals signs  reviewed.  Constitutional:      Appearance: Normal appearance. She is obese.  Cardiovascular:     Rate and Rhythm: Normal rate.  Pulmonary:     Effort: Pulmonary effort is normal.  Musculoskeletal: Normal range of motion.  Skin:    General: Skin is warm and dry.  Neurological:     Mental Status: She is alert and oriented to person, place, and time.  Psychiatric:        Mood and Affect: Mood normal.        Behavior: Behavior normal.    RECENT LABS AND TESTS: BMET    Component Value Date/Time   NA 140 10/10/2017 1040   K 4.5 10/10/2017  1040   CL 102 10/10/2017 1040   CO2 26 10/10/2017 1040   GLUCOSE 93 10/10/2017 1040   GLUCOSE 100 (H) 06/03/2015 0747   BUN 13 10/10/2017 1040   CREATININE 0.74 10/10/2017 1040   CALCIUM 9.1 10/10/2017 1040   GFRNONAA 88 10/10/2017 1040   GFRAA 102 10/10/2017 1040   Lab Results  Component Value Date   HGBA1C 5.5 10/10/2017   HGBA1C 5.6 05/09/2017   HGBA1C 5.6 12/06/2016   HGBA1C 5.6 05/17/2016   HGBA1C 5.9 09/22/2015   Lab Results  Component Value Date   INSULIN 6.1 10/10/2017   INSULIN 6.1 05/09/2017   INSULIN 11.2 12/06/2016   INSULIN 9.8 05/17/2016   CBC    Component Value Date/Time   WBC 6.3 05/09/2017 0917   WBC 6.5 11/13/2012 1003   RBC 4.73 05/09/2017 0917   RBC 5.03 11/13/2012 1003   HGB 14.0 05/09/2017 0917   HCT 42.8 05/09/2017 0917   PLT 315 05/09/2017 0917   MCV 91 05/09/2017 0917   MCH 29.6 05/09/2017 0917   MCH 26.9 (A) 05/20/2012 1404   MCHC 32.7 05/09/2017 0917   MCHC 33.5 11/13/2012 1003   RDW 13.3 05/09/2017 0917   LYMPHSABS 1.8 05/09/2017 0917   MONOABS 0.4 11/13/2012 1003   EOSABS 0.1 05/09/2017 0917   BASOSABS 0.0 05/09/2017 0917   Iron/TIBC/Ferritin/ %Sat    Component Value Date/Time   IRON 88 05/20/2012 1422   TIBC 303 05/20/2012 1422   FERRITIN 80 05/20/2012 1422   IRONPCTSAT 29 05/20/2012 1422   Lipid Panel     Component Value Date/Time   CHOL 181 05/09/2017 0917   TRIG 39  05/09/2017 0917   HDL 67 05/09/2017 0917   CHOLHDL 2.7 05/09/2017 0917   CHOLHDL 3 06/03/2015 0747   VLDL 9.0 06/03/2015 0747   LDLCALC 106 (H) 05/09/2017 0917   Hepatic Function Panel     Component Value Date/Time   PROT 7.0 10/10/2017 1040   ALBUMIN 4.0 10/10/2017 1040   AST 22 10/10/2017 1040   ALT 19 10/10/2017 1040   ALKPHOS 92 10/10/2017 1040   BILITOT 0.4 10/10/2017 1040   BILIDIR 0.1 11/13/2012 1003      Component Value Date/Time   TSH 2.460 05/09/2017 0917   TSH 1.770 05/17/2016 0949   TSH 1.23 11/13/2012 1003   Results for Brenda Russo, LAIDLER (MRN [id number]) as of 02/13/2018 07:57  Ref. Range 10/10/2017 10:40  Vitamin D, 25-Hydroxy Latest Ref Range: 30.0 - 100.0 ng/mL 85.3    OBESITY BEHAVIORAL INTERVENTION VISIT  Today's visit was # 35  Starting weight: 200 lbs Starting date: 05/17/16 Today's weight : Weight: 195 lb (88.5 kg)  Today's date: 02/12/2018 Total lbs lost to date: 5  ASK: We discussed the diagnosis of obesity with [name] today and Melissia agreed to give [name] permission to discuss obesity behavioral modification therapy today.  ASSESS: Maliea has the diagnosis of obesity and her BMI today is 32.4. Amijah is in the action stage of change.   ADVISE: Dalores was educated on the multiple health risks of obesity as well as the benefit of weight loss to improve her health. She was advised of the need for long term treatment and the importance of lifestyle modifications to improve her current health and to decrease her risk of future health problems.  AGREE: Multiple dietary modification options and treatment options were discussed and Darcella agreed to follow the recommendations documented in the above note.  ARRANGE: Kimmerly was educated on  the importance of frequent visits to treat obesity as outlined per CMS and USPSTF guidelines and agreed to schedule her next follow up appointment today.  I, Marcille Blanco, am acting as  transcriptionist for Starlyn Skeans, MD  I have reviewed the above documentation for accuracy and completeness, and I agree with the above. -Dennard Nip, MD

## 2018-02-16 MED FILL — BELVIQ 10 MG TABLET: 10 | 30 days supply | Qty: 60 | Fill #0

## 2018-02-20 ENCOUNTER — Encounter (INDEPENDENT_AMBULATORY_CARE_PROVIDER_SITE_OTHER): Payer: Self-pay

## 2018-02-22 MED FILL — CHLORHEXIDINE 0.12% RINSE: 0.12 | 15 days supply | Qty: 473 | Fill #0

## 2018-02-27 ENCOUNTER — Ambulatory Visit (INDEPENDENT_AMBULATORY_CARE_PROVIDER_SITE_OTHER): Payer: 59 | Admitting: Family Medicine

## 2018-02-27 ENCOUNTER — Encounter (INDEPENDENT_AMBULATORY_CARE_PROVIDER_SITE_OTHER): Payer: Self-pay | Admitting: Family Medicine

## 2018-02-27 VITALS — BP 135/70 | HR 66 | Ht 65.0 in | Wt 197.0 lb

## 2018-02-27 DIAGNOSIS — E669 Obesity, unspecified: Secondary | ICD-10-CM

## 2018-02-27 DIAGNOSIS — Z6832 Body mass index (BMI) 32.0-32.9, adult: Secondary | ICD-10-CM

## 2018-02-27 DIAGNOSIS — R42 Dizziness and giddiness: Secondary | ICD-10-CM

## 2018-02-28 MED ORDER — LORCASERIN HCL 10 MG PO TABS: 1.0000 | ORAL_TABLET | Freq: Two times a day (BID) | ORAL | 0 refills | Status: DC

## 2018-02-28 NOTE — Progress Notes (Signed)
Office: [phone]  /  Fax: [phone]   HPI:   Chief Complaint: OBESITY Brenda Russo is here to discuss her progress with her obesity treatment plan. She is on the keep a food journal with 1200 calories and 75+ grams of protein daily and is following her eating plan approximately 50 % of the time. She states she is walking 17,000 steps 6-7 times per week. [name] is journaling part of the day, but often goes over her calorie goal and then stops journaling. She is on Belviq and isn't certain if it is helping but has only been on it for 3 weeks.  Her weight is 197 lb (89.4 kg) today and has gained 2 pounds since her last visit. She has lost 3 lbs since starting treatment with [name].  Lightheaded Brenda Russo has had 2 to 3 days of feeling "swimmy" in her head with positional changes. She states this is resolved today. She wonders if it is related to her Belviq.  ASSESSMENT AND PLAN:  Lightheaded  Class 1 obesity with serious comorbidity and body mass index (BMI) of 32.0 to 32.9 in adult, unspecified obesity type - Plan: Lorcaserin HCl (BELVIQ) 10 MG TABS  PLAN:  Lightheaded Brenda Russo is to increase H20 to 80 oz daily and will continue to monitor. May need to change her medications if this continues. [name] agrees to follow up with our clinic in 2 weeks with Jake Bathe, [place].  I spent > than 50% of the 25 minute visit on counseling as documented in the note.  Obesity Cecille is currently in the action stage of change. As such, her goal is to continue with weight loss efforts She has agreed to keep a food journal with 1200 calories and 75+ grams of protein daily Brenda Russo has been instructed to work up to a goal of 150 minutes of combined cardio and strengthening exercise per week for weight loss and overall health benefits. We discussed the following Behavioral Modification Strategies today: decreasing simple carbohydrates, increase H20 intake, and work on meal planning and easy cooking  plans We discussed various medication options to help Brenda Russo with her weight loss efforts and we both agreed to continue taking Belviq 10 mg 1 tablet BID #60 and we will refill for 1 month.  [name] has agreed to follow up with our clinic in 2 weeks with Jake Bathe, [place]. She was informed of the importance of frequent follow up visits to maximize her success with intensive lifestyle modifications for her multiple health conditions.  ALLERGIES: Allergies  Allergen Reactions  . Eggs Or Egg-Derived Products     Shortness of breath, wheezing    MEDICATIONS: Current Outpatient Medications on File Prior to Visit  Medication Sig Dispense Refill  . albuterol (VENTOLIN HFA) 108 (90 Base) MCG/ACT inhaler Inhale 1-2 puffs into the lungs every 4 (four) hours as needed for wheezing or shortness of breath. 54 g 1  . amLODipine (NORVASC) 5 MG tablet Take 1 tablet (5 mg total) by mouth daily. 90 tablet 3  . Ascorbic Acid (VITAMIN C) 1000 MG tablet Take 1,000 mg by mouth daily.    . Coenzyme Q10 (COQ10) 100 MG CAPS Take 1 capsule by mouth daily.    . Ergocalciferol 2500 units CAPS Take 5,000 Units by mouth daily. 30 capsule 0  . fluticasone (FLONASE) 50 MCG/ACT nasal spray Place 2 sprays into both nostrils daily. 16 g 5  . fluticasone furoate-vilanterol (BREO ELLIPTA) 200-25 MCG/INH AEPB Inhale 1 puff into the lungs daily. 180 each 3  .  Lorcaserin HCl (BELVIQ) 10 MG TABS Take 1 tablet by mouth 2 (two) times daily. 60 tablet 0  . losartan-hydrochlorothiazide (HYZAAR) 100-12.5 MG tablet Take 1 tablet by mouth daily. 90 tablet 2  . magnesium oxide (MAG-OX) 400 MG tablet 2 tablets daily. 400-800 mg daily    . meloxicam (MOBIC) 7.5 MG tablet Take 1 tablet (7.5 mg total) by mouth daily. 30 tablet 0  . Multiple Minerals-Vitamins (CALCIUM CITRATE PLUS/MAGNESIUM) TABS Take 1 tablet by mouth.    . Multiple Vitamins-Minerals (COMPLETE WOMENS PO) Take 1 capsule by mouth 2 (two) times daily.      . Omega-3 Fatty  Acids (FISH OIL) 1000 MG CAPS Take 1 capsule by mouth.     No current facility-administered medications on file prior to visit.     PAST MEDICAL HISTORY: Past Medical History:  Diagnosis Date  . Allergic rhinitis   . Anemia   . Anxiety   . Asthma   . HTN (hypertension)   . Hyperlipidemia    borderline  . Obese   . OSA (obstructive sleep apnea)    mild ,no CPAP  . Overweight(278.02)   . Plantar wart    history  . Prediabetes   . Shortness of breath     PAST SURGICAL HISTORY: Past Surgical History:  Procedure Laterality Date  . BREAST BIOPSY Right 09/02/2015  . CESAREAN SECTION    . TUBAL LIGATION      SOCIAL HISTORY: Social History   Tobacco Use  . Smoking status: Never Smoker  . Smokeless tobacco: Never Used  Substance Use Topics  . Alcohol use: No  . Drug use: No    FAMILY HISTORY: Family History  Problem Relation Age of Onset  . Heart disease Mother        pacemaker   . Hypertension Mother   . Hyperlipidemia Mother   . Obesity Mother   . Asthma Father   . Pancreatic cancer Brother   . Breast cancer Maternal Aunt   . Colon cancer Neg Hx     ROS: Review of Systems  Constitutional: Negative for weight loss.  Neurological:       + Lightheadedness    PHYSICAL EXAM: Blood pressure 135/70, pulse 66, height 5\' 5"  (1.651 m), weight 197 lb (89.4 kg), SpO2 98 %. Body mass index is 32.78 kg/m. Physical Exam Vitals signs reviewed.  Constitutional:      Appearance: Normal appearance. She is obese.  Cardiovascular:     Rate and Rhythm: Normal rate.     Pulses: Normal pulses.  Pulmonary:     Effort: Pulmonary effort is normal.     Breath sounds: Normal breath sounds.  Musculoskeletal: Normal range of motion.  Skin:    General: Skin is warm and dry.  Neurological:     Mental Status: She is alert and oriented to person, place, and time.  Psychiatric:        Mood and Affect: Mood normal.        Behavior: Behavior normal.     RECENT LABS AND  TESTS: BMET    Component Value Date/Time   NA 140 10/10/2017 1040   K 4.5 10/10/2017 1040   CL 102 10/10/2017 1040   CO2 26 10/10/2017 1040   GLUCOSE 93 10/10/2017 1040   GLUCOSE 100 (H) 06/03/2015 0747   BUN 13 10/10/2017 1040   CREATININE 0.74 10/10/2017 1040   CALCIUM 9.1 10/10/2017 1040   GFRNONAA 88 10/10/2017 1040   GFRAA 102 10/10/2017 1040  Lab Results  Component Value Date   HGBA1C 5.5 10/10/2017   HGBA1C 5.6 05/09/2017   HGBA1C 5.6 12/06/2016   HGBA1C 5.6 05/17/2016   HGBA1C 5.9 09/22/2015   Lab Results  Component Value Date   INSULIN 6.1 10/10/2017   INSULIN 6.1 05/09/2017   INSULIN 11.2 12/06/2016   INSULIN 9.8 05/17/2016   CBC    Component Value Date/Time   WBC 6.3 05/09/2017 0917   WBC 6.5 11/13/2012 1003   RBC 4.73 05/09/2017 0917   RBC 5.03 11/13/2012 1003   HGB 14.0 05/09/2017 0917   HCT 42.8 05/09/2017 0917   PLT 315 05/09/2017 0917   MCV 91 05/09/2017 0917   MCH 29.6 05/09/2017 0917   MCH 26.9 (A) 05/20/2012 1404   MCHC 32.7 05/09/2017 0917   MCHC 33.5 11/13/2012 1003   RDW 13.3 05/09/2017 0917   LYMPHSABS 1.8 05/09/2017 0917   MONOABS 0.4 11/13/2012 1003   EOSABS 0.1 05/09/2017 0917   BASOSABS 0.0 05/09/2017 0917   Iron/TIBC/Ferritin/ %Sat    Component Value Date/Time   IRON 88 05/20/2012 1422   TIBC 303 05/20/2012 1422   FERRITIN 80 05/20/2012 1422   IRONPCTSAT 29 05/20/2012 1422   Lipid Panel     Component Value Date/Time   CHOL 181 05/09/2017 0917   TRIG 39 05/09/2017 0917   HDL 67 05/09/2017 0917   CHOLHDL 2.7 05/09/2017 0917   CHOLHDL 3 06/03/2015 0747   VLDL 9.0 06/03/2015 0747   LDLCALC 106 (H) 05/09/2017 0917   Hepatic Function Panel     Component Value Date/Time   PROT 7.0 10/10/2017 1040   ALBUMIN 4.0 10/10/2017 1040   AST 22 10/10/2017 1040   ALT 19 10/10/2017 1040   ALKPHOS 92 10/10/2017 1040   BILITOT 0.4 10/10/2017 1040   BILIDIR 0.1 11/13/2012 1003      Component Value Date/Time   TSH 2.460  05/09/2017 0917   TSH 1.770 05/17/2016 0949   TSH 1.23 11/13/2012 1003      OBESITY BEHAVIORAL INTERVENTION VISIT  Today's visit was # 16   Starting weight: 200 lbs Starting date: 05/17/16 Today's weight : 197 lbs  Today's date: 02/27/2018 Total lbs lost to date: 3    ASK: We discussed the diagnosis of obesity with [name] today and Tanishi agreed to give [name] permission to discuss obesity behavioral modification therapy today.  ASSESS: Harla has the diagnosis of obesity and her BMI today is 32.78 Cristan is in the action stage of change   ADVISE: Carneshia was educated on the multiple health risks of obesity as well as the benefit of weight loss to improve her health. She was advised of the need for long term treatment and the importance of lifestyle modifications to improve her current health and to decrease her risk of future health problems.  AGREE: Multiple dietary modification options and treatment options were discussed and  Sohana agreed to follow the recommendations documented in the above note.  ARRANGE: Hera was educated on the importance of frequent visits to treat obesity as outlined per CMS and USPSTF guidelines and agreed to schedule her next follow up appointment today.  I, Trixie Dredge, am acting as transcriptionist for Dennard Nip, MD  I have reviewed the above documentation for accuracy and completeness, and I agree with the above. -Dennard Nip, MD

## 2018-03-12 ENCOUNTER — Ambulatory Visit (INDEPENDENT_AMBULATORY_CARE_PROVIDER_SITE_OTHER): Payer: 59 | Admitting: Family Medicine

## 2018-03-12 ENCOUNTER — Encounter (INDEPENDENT_AMBULATORY_CARE_PROVIDER_SITE_OTHER): Payer: Self-pay | Admitting: Family Medicine

## 2018-03-12 VITALS — BP 108/69 | HR 60 | Temp 97.9°F | Ht 65.0 in | Wt 192.0 lb

## 2018-03-12 DIAGNOSIS — E669 Obesity, unspecified: Secondary | ICD-10-CM

## 2018-03-12 DIAGNOSIS — Z6831 Body mass index (BMI) 31.0-31.9, adult: Secondary | ICD-10-CM

## 2018-03-12 DIAGNOSIS — F3289 Other specified depressive episodes: Secondary | ICD-10-CM | POA: Diagnosis not present

## 2018-03-12 DIAGNOSIS — Z9189 Other specified personal risk factors, not elsewhere classified: Secondary | ICD-10-CM

## 2018-03-12 DIAGNOSIS — E8881 Metabolic syndrome: Secondary | ICD-10-CM | POA: Diagnosis not present

## 2018-03-12 MED ORDER — TOPIRAMATE 50 MG PO TABS: 50.0000 mg | ORAL_TABLET | Freq: Every day | ORAL | 0 refills | Status: DC

## 2018-03-12 MED FILL — TOPIRAMATE 50 MG TABLET: 50 | 30 days supply | Qty: 30 | Fill #0

## 2018-03-13 NOTE — Progress Notes (Signed)
Office: [phone]  /  Fax: [phone]   HPI:   Chief Complaint: OBESITY Brenda Russo is here to discuss Brenda Russo progress with Brenda Russo obesity treatment plan. She is on the keep a food journal with 1200 calories and 75+ grams of protein daily and is following Brenda Russo eating plan approximately 50 % of the time. She states she is walking 17,000 steps 6-7 times per week. Brenda Russo has been on Belviq for 5 weeks and does not feel it is helping with appetite. She was on bupropion, but did not feel it helped. She notes cravings at night. She feels she lacks motivation.  Brenda Russo weight is 192 lb (87.1 kg) today and has had a weight loss of 5 pounds over a period of 2 weeks since Brenda Russo last visit. She has lost 8 lbs since starting treatment with [name].  Insulin Resistance Brenda Russo has a diagnosis of insulin resistance based on Brenda Russo elevated fasting insulin level >5. Although Brenda Russo blood glucose readings are still under good control, insulin resistance puts Brenda Russo at greater risk of metabolic syndrome and diabetes. She is not on metformin and notes cravings. She continues to work on diet and exercise to decrease risk of diabetes. Lab Results  Component Value Date   HGBA1C 5.5 10/10/2017    At risk for diabetes Brenda Russo is at higher than average risk for developing diabetes due to Brenda Russo obesity and insulin resistance. She currently denies polyuria or polydipsia.  Depression with emotional eating behaviors Brenda Russo notes cravings after dinner. She may consider seeing Dr. Mallie Mussel, our bariatric psychologist after she comes back from trip to [name] in March. She notes it is hard for Brenda Russo to control eating at and after dinner. Brenda Russo struggles with emotional eating and using food for comfort to the extent that it is negatively impacting Brenda Russo health. She often snacks when she is not hungry. Brenda Russo sometimes feels she is out of control and then feels guilty that she made poor food choices. She has been working on behavior  modification techniques to help reduce Brenda Russo emotional eating and has been somewhat successful. She shows no sign of suicidal or homicidal ideations.  Depression screen [facility] 2/9 05/17/2016 04/12/2016 12/22/2015 11/21/2015 11/18/2015  Decreased Interest 0 0 0 0 0  Down, Depressed, Hopeless 1 0 0 0 0  PHQ - 2 Score 1 0 0 0 0  Altered sleeping 2 - - - -  Tired, decreased energy 1 - - - -  Change in appetite 2 - - - -  Feeling bad or failure about yourself  0 - - - -  Trouble concentrating 2 - - - -  Moving slowly or fidgety/restless 0 - - - -  Suicidal thoughts 0 - - - -  PHQ-9 Score 8 - - - -    ASSESSMENT AND PLAN:  Insulin resistance  Other depression - with emotional eating   At risk for diabetes mellitus  Class 1 obesity with serious comorbidity and body mass index (BMI) of 31.0 to 31.9 in adult, unspecified obesity type  PLAN:  Insulin Resistance Brenda Russo will continue Brenda Russo meal plan, and will continue to work on weight loss, exercise, and decreasing simple carbohydrates in Brenda Russo diet to help decrease the risk of diabetes. We dicussed metformin including benefits and risks. She was informed that eating too many simple carbohydrates or too many calories at one sitting increases the likelihood of GI side effects. Brenda Russo declined metformin for now and prescription was not written today. We will recheck A1c, fasting insulin and  glucose at next visit. Brenda Russo agrees to follow up with our clinic in 2 weeks as directed to monitor Brenda Russo progress.  Diabetes risk counselling Brenda Russo was given extended (15 minutes) diabetes prevention counseling today. She is 63 y.o. female and has risk factors for diabetes including obesity and insulin resistance. We discussed intensive lifestyle modifications today with an emphasis on weight loss as well as increasing exercise and decreasing simple carbohydrates in Brenda Russo diet.  Depression with Emotional Eating Behaviors We discussed behavior modification  techniques today to help Brenda Russo deal with Brenda Russo emotional eating and depression. Brenda Russo agrees to start topiramate 50 mg qhs #30 with no refills. [name] agrees to follow up with our clinic in 2 weeks.  Obesity Brenda Russo is currently in the action stage of change. As such, Brenda Russo goal is to continue with weight loss efforts She has agreed to keep a food journal with 1200 calories and 75+ grams of protein daily Brenda Russo will continue current exercise regimen for weight loss and overall health benefits. We discussed the following Behavioral Modification Strategies today: emotional eating strategies, ways to avoid night time snacking, and planning for success We discussed various medication options to help Amarah with Brenda Russo weight loss efforts and we both agreed to discontinue Belviq.  Brenda Russo has agreed to follow up with our clinic in 2 weeks. She was informed of the importance of frequent follow up visits to maximize Brenda Russo success with intensive lifestyle modifications for Brenda Russo multiple health conditions.  ALLERGIES: Allergies  Allergen Reactions  . Eggs Or Egg-Derived Products     Shortness of breath, wheezing    MEDICATIONS: Current Outpatient Medications on File Prior to Visit  Medication Sig Dispense Refill  . albuterol (VENTOLIN HFA) 108 (90 Base) MCG/ACT inhaler Inhale 1-2 puffs into the lungs every 4 (four) hours as needed for wheezing or shortness of breath. 54 g 1  . amLODipine (NORVASC) 5 MG tablet Take 1 tablet (5 mg total) by mouth daily. 90 tablet 3  . Ascorbic Acid (VITAMIN C) 1000 MG tablet Take 1,000 mg by mouth daily.    . Coenzyme Q10 (COQ10) 100 MG CAPS Take 1 capsule by mouth daily.    . Ergocalciferol 2500 units CAPS Take 5,000 Units by mouth daily. 30 capsule 0  . fluticasone (FLONASE) 50 MCG/ACT nasal spray Place 2 sprays into both nostrils daily. 16 g 5  . fluticasone furoate-vilanterol (BREO ELLIPTA) 200-25 MCG/INH AEPB Inhale 1 puff into the lungs daily. 180 each 3    . Lorcaserin HCl (BELVIQ) 10 MG TABS Take 1 tablet by mouth 2 (two) times daily. 60 tablet 0  . losartan-hydrochlorothiazide (HYZAAR) 100-12.5 MG tablet Take 1 tablet by mouth daily. 90 tablet 2  . magnesium oxide (MAG-OX) 400 MG tablet 2 tablets daily. 400-800 mg daily    . meloxicam (MOBIC) 7.5 MG tablet Take 1 tablet (7.5 mg total) by mouth daily. 30 tablet 0  . Multiple Minerals-Vitamins (CALCIUM CITRATE PLUS/MAGNESIUM) TABS Take 1 tablet by mouth.    . Multiple Vitamins-Minerals (COMPLETE WOMENS PO) Take 1 capsule by mouth 2 (two) times daily.      . Omega-3 Fatty Acids (FISH OIL) 1000 MG CAPS Take 1 capsule by mouth.     No current facility-administered medications on file prior to visit.     PAST MEDICAL HISTORY: Past Medical History:  Diagnosis Date  . Allergic rhinitis   . Anemia   . Anxiety   . Asthma   . HTN (hypertension)   . Hyperlipidemia  borderline  . Obese   . OSA (obstructive sleep apnea)    mild ,no CPAP  . Overweight(278.02)   . Plantar wart    history  . Prediabetes   . Shortness of breath     PAST SURGICAL HISTORY: Past Surgical History:  Procedure Laterality Date  . BREAST BIOPSY Right 09/02/2015  . CESAREAN SECTION    . TUBAL LIGATION      SOCIAL HISTORY: Social History   Tobacco Use  . Smoking status: Never Smoker  . Smokeless tobacco: Never Used  Substance Use Topics  . Alcohol use: No  . Drug use: No    FAMILY HISTORY: Family History  Problem Relation Age of Onset  . Heart disease Mother        pacemaker   . Hypertension Mother   . Hyperlipidemia Mother   . Obesity Mother   . Asthma Father   . Pancreatic cancer Brother   . Breast cancer Maternal Aunt   . Colon cancer Neg Hx     ROS: Review of Systems  Constitutional: Positive for weight loss.  Genitourinary: Negative for frequency.  Endo/Heme/Allergies: Negative for polydipsia.  Psychiatric/Behavioral: Positive for depression. Negative for suicidal ideas.     PHYSICAL EXAM: Blood pressure 108/69, pulse 60, temperature 97.9 F (36.6 C), temperature source Oral, height 5\' 5"  (1.651 m), weight 192 lb (87.1 kg), SpO2 96 %. Body mass index is 31.95 kg/m. Physical Exam Vitals signs reviewed.  Constitutional:      Appearance: Normal appearance. She is obese.  Cardiovascular:     Rate and Rhythm: Normal rate.     Pulses: Normal pulses.  Pulmonary:     Effort: Pulmonary effort is normal.     Breath sounds: Normal breath sounds.  Musculoskeletal: Normal range of motion.  Skin:    General: Skin is warm and dry.  Neurological:     Mental Status: She is alert and oriented to person, place, and time.  Psychiatric:        Mood and Affect: Mood normal.        Behavior: Behavior normal.     RECENT LABS AND TESTS: BMET    Component Value Date/Time   NA 140 10/10/2017 1040   K 4.5 10/10/2017 1040   CL 102 10/10/2017 1040   CO2 26 10/10/2017 1040   GLUCOSE 93 10/10/2017 1040   GLUCOSE 100 (H) 06/03/2015 0747   BUN 13 10/10/2017 1040   CREATININE 0.74 10/10/2017 1040   CALCIUM 9.1 10/10/2017 1040   GFRNONAA 88 10/10/2017 1040   GFRAA 102 10/10/2017 1040   Lab Results  Component Value Date   HGBA1C 5.5 10/10/2017   HGBA1C 5.6 05/09/2017   HGBA1C 5.6 12/06/2016   HGBA1C 5.6 05/17/2016   HGBA1C 5.9 09/22/2015   Lab Results  Component Value Date   INSULIN 6.1 10/10/2017   INSULIN 6.1 05/09/2017   INSULIN 11.2 12/06/2016   INSULIN 9.8 05/17/2016   CBC    Component Value Date/Time   WBC 6.3 05/09/2017 0917   WBC 6.5 11/13/2012 1003   RBC 4.73 05/09/2017 0917   RBC 5.03 11/13/2012 1003   HGB 14.0 05/09/2017 0917   HCT 42.8 05/09/2017 0917   PLT 315 05/09/2017 0917   MCV 91 05/09/2017 0917   MCH 29.6 05/09/2017 0917   MCH 26.9 (A) 05/20/2012 1404   MCHC 32.7 05/09/2017 0917   MCHC 33.5 11/13/2012 1003   RDW 13.3 05/09/2017 0917   LYMPHSABS 1.8 05/09/2017 0917   MONOABS 0.4  11/13/2012 1003   EOSABS 0.1 05/09/2017 0917    BASOSABS 0.0 05/09/2017 0917   Iron/TIBC/Ferritin/ %Sat    Component Value Date/Time   IRON 88 05/20/2012 1422   TIBC 303 05/20/2012 1422   FERRITIN 80 05/20/2012 1422   IRONPCTSAT 29 05/20/2012 1422   Lipid Panel     Component Value Date/Time   CHOL 181 05/09/2017 0917   TRIG 39 05/09/2017 0917   HDL 67 05/09/2017 0917   CHOLHDL 2.7 05/09/2017 0917   CHOLHDL 3 06/03/2015 0747   VLDL 9.0 06/03/2015 0747   LDLCALC 106 (H) 05/09/2017 0917   Hepatic Function Panel     Component Value Date/Time   PROT 7.0 10/10/2017 1040   ALBUMIN 4.0 10/10/2017 1040   AST 22 10/10/2017 1040   ALT 19 10/10/2017 1040   ALKPHOS 92 10/10/2017 1040   BILITOT 0.4 10/10/2017 1040   BILIDIR 0.1 11/13/2012 1003      Component Value Date/Time   TSH 2.460 05/09/2017 0917   TSH 1.770 05/17/2016 0949   TSH 1.23 11/13/2012 1003      OBESITY BEHAVIORAL INTERVENTION VISIT  Today's visit was # 27   Starting weight: 200 lbs Starting date: 05/17/16 Today's weight : 192 lbs  Today's date: 03/12/2018 Total lbs lost to date: 8    ASK: We discussed the diagnosis of obesity with Brenda Russo today and Brenda Russo agreed to give [name] permission to discuss obesity behavioral modification therapy today.  ASSESS: Brenda Russo has the diagnosis of obesity and Brenda Russo BMI today is 31.95 Conny is in the action stage of change   ADVISE: Yolander was educated on the multiple health risks of obesity as well as the benefit of weight loss to improve Brenda Russo health. She was advised of the need for long term treatment and the importance of lifestyle modifications to improve Brenda Russo current health and to decrease Brenda Russo risk of future health problems.  AGREE: Multiple dietary modification options and treatment options were discussed and  Brenda Russo agreed to follow the recommendations documented in the above note.  ARRANGE: Ruthie was educated on the importance of frequent visits to treat obesity as outlined per CMS and  USPSTF guidelines and agreed to schedule Brenda Russo next follow up appointment today.  Wilhemena Durie, am acting as Location manager for [employer], FNP-C.  I have reviewed the above documentation for accuracy and completeness, and I agree with the above.  -  , FNP-C.

## 2018-03-14 ENCOUNTER — Encounter (INDEPENDENT_AMBULATORY_CARE_PROVIDER_SITE_OTHER): Payer: Self-pay | Admitting: Family Medicine

## 2018-03-17 ENCOUNTER — Other Ambulatory Visit: Payer: Self-pay

## 2018-03-17 ENCOUNTER — Ambulatory Visit
Admission: EM | Admit: 2018-03-17 | Discharge: 2018-03-17 | Disposition: A | Discharge status: Home / Self Care | Payer: 59 | Attending: Family Medicine | Admitting: Family Medicine

## 2018-03-17 ENCOUNTER — Ambulatory Visit: Payer: 59

## 2018-03-17 DIAGNOSIS — R0989 Other specified symptoms and signs involving the circulatory and respiratory systems: Secondary | ICD-10-CM | POA: Diagnosis not present

## 2018-03-17 DIAGNOSIS — J069 Acute upper respiratory infection, unspecified: Secondary | ICD-10-CM

## 2018-03-17 NOTE — ED Triage Notes (Signed)
Per pt she has been having cough, congestion with clear mucus. Hx asthma. Mild wheezing and SOB. Taking mucinex, tylenol

## 2018-03-17 NOTE — Discharge Instructions (Signed)
Your chest x ray was normal Most likely this is viral.  Keep using the OTC medication as needed.  Follow up as needed for continued or worsening symptoms

## 2018-03-17 NOTE — ED Provider Notes (Signed)
[facility]    CSN: [id number] Arrival date & time: 03/17/18  1310     History   Chief Complaint Chief Complaint  Patient presents with  . Nasal Congestion  . Cough    HPI Brenda Russo is a 63 y.o. female.   Pt is a 63 year old female with past medical history of asthma that presents with cough, congestion that has worsened over the past week. Brenda Russo reports wheezing at night. Started with low grade fever, nasal congestion and rhinorrhea. That has subside. The cough has been productive.  Brenda Russo has had some mild chest tightness and shortness of breath.  Brenda Russo has been using her daily inhalers and rescue inhaler as needed.  Brenda Russo is also been using Mucinex and Tylenol.  This somewhat helps her symptoms.  Denies any recent traveling or sick contacts.  Brenda Russo is here concerned because Brenda Russo is traveling to [name] in the next week.  ROS per HPI      Past Medical History:  Diagnosis Date  . Allergic rhinitis   . Anemia   . Anxiety   . Asthma   . HTN (hypertension)   . Hyperlipidemia    borderline  . Obese   . OSA (obstructive sleep apnea)    mild ,no CPAP  . Overweight(278.02)   . Plantar wart    history  . Prediabetes   . Shortness of breath     Patient Active Problem List   Diagnosis Date Noted  . 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 Vitamin D deficiency 12/06/2016  . High serum vitamin B12 12/06/2016  . Depression 08/30/2016  . Class 1 obesity without serious comorbidity with body mass index (BMI) of 31.0 to 31.9 in adult 08/30/2016  . Bradycardia 04/13/2016  . PVC's (premature ventricular contractions) 04/13/2016  . HH (hiatus hernia) 12/24/2015  . Laryngopharyngeal reflux (LPR) 12/24/2015  . PCP NOTES >>>>>>>>>>>>>>>>>>>> 05/20/2015  . Annual physical exam 04/29/2014  . Back pain 11/06/2013  . OSA (obstructive sleep apnea) 09/25/2012  . DJD (degenerative joint disease) 07/02/2010  . Essential hypertension 05/21/2009  . HYPERCHOLESTEROLEMIA, BORDERLINE  07/27/2007  . ANXIETY 07/27/2007  . Allergic rhinitis 07/27/2007  . Asthma 01/11/2007    Past Surgical History:  Procedure Laterality Date  . BREAST BIOPSY Right 09/02/2015  . CESAREAN SECTION    . TUBAL LIGATION      OB History    Gravida  3   Para  3   Term  3   Preterm      AB      Living  3     SAB      TAB      Ectopic      Multiple      Live Births               Home Medications    Prior to Admission medications   Medication Sig Start Date End Date Taking? Authorizing Provider  albuterol (VENTOLIN HFA) 108 (90 Base) MCG/ACT inhaler Inhale 1-2 puffs into the lungs every 4 (four) hours as needed for wheezing or shortness of breath. 08/04/16   Colon Branch, MD  amLODipine (NORVASC) 5 MG tablet Take 1 tablet (5 mg total) by mouth daily. 06/13/17   Colon Branch, MD  Ascorbic Acid (VITAMIN C) 1000 MG tablet Take 1,000 mg by mouth daily.    [provider]  Coenzyme Q10 (COQ10) 100 MG CAPS Take 1 capsule by mouth daily.    [provider]  Ergocalciferol 2500 units CAPS Take 5,000 Units by mouth daily. 05/22/17   Eber Jones, MD  fluticasone [facility]) 50 MCG/ACT nasal spray Place 2 sprays into both nostrils daily. 05/18/16   Colon Branch, MD  fluticasone furoate-vilanterol (BREO ELLIPTA) 200-25 MCG/INH AEPB Inhale 1 puff into the lungs daily. 06/14/17   Colon Branch, MD  Lorcaserin HCl (BELVIQ) 10 MG TABS Take 1 tablet by mouth 2 (two) times daily. 02/28/18   Dennard Nip D, MD  losartan-hydrochlorothiazide (HYZAAR) 100-12.5 MG tablet Take 1 tablet by mouth daily. 06/19/17   Colon Branch, MD  magnesium oxide (MAG-OX) 400 MG tablet 2 tablets daily. 400-800 mg daily    [provider]  meloxicam (MOBIC) 7.5 MG tablet Take 1 tablet (7.5 mg total) by mouth daily. 07/20/17   Wallene Huh, DPM  Multiple Minerals-Vitamins (CALCIUM CITRATE PLUS/MAGNESIUM) TABS Take 1 tablet by mouth.    [provider]  Multiple Vitamins-Minerals  (COMPLETE WOMENS PO) Take 1 capsule by mouth 2 (two) times daily.      [provider]  Omega-3 Fatty Acids (FISH OIL) 1000 MG CAPS Take 1 capsule by mouth.    [provider]  topiramate (TOPAMAX) 50 MG tablet Take 1 tablet (50 mg total) by mouth daily. 03/12/18   [place], Joneen Boers, FNP    Family History Family History  Problem Relation Age of Onset  . Heart disease Mother        pacemaker   . Hypertension Mother   . Hyperlipidemia Mother   . Obesity Mother   . Asthma Father   . Pancreatic cancer Brother   . Breast cancer Maternal Aunt   . Colon cancer Neg Hx     Social History Social History   Tobacco Use  . Smoking status: Never Smoker  . Smokeless tobacco: Never Used  Substance Use Topics  . Alcohol use: No  . Drug use: No     Allergies   Eggs or egg-derived products   Review of Systems Review of Systems   Physical Exam Triage Vital Signs ED Triage Vitals  Enc Vitals Group     BP --      Pulse Rate 03/17/18 1351 61     Resp 03/17/18 1351 18     Temp 03/17/18 1351 97.8 F (36.6 C)     Temp Source 03/17/18 1351 Oral     SpO2 03/17/18 1351 97 %     Weight 03/17/18 1354 195 lb (88.5 kg)     Height 03/17/18 1354 5\' 5"  (1.651 m)     Head Circumference --      Peak Flow --      Pain Score 03/17/18 1351 0     Pain Loc --      Pain Edu? --      Excl. in [place]? --    No data found.  Updated Vital Signs Pulse 61   Temp 97.8 F (36.6 C) (Oral)   Resp 18   Ht 5\' 5"  (1.651 m)   Wt 195 lb (88.5 kg)   SpO2 97%   BMI 32.45 kg/m   Visual Acuity Right Eye Distance:   Left Eye Distance:   Bilateral Distance:    Right Eye Near:   Left Eye Near:    Bilateral Near:     Physical Exam Vitals signs and nursing note reviewed.  Constitutional:      General: Brenda Russo is not in acute distress.    Appearance: Normal appearance. Brenda Russo  is well-developed. Brenda Russo is not ill-appearing, toxic-appearing or diaphoretic.  HENT:     Head: Normocephalic and  atraumatic.     Right Ear: There is impacted cerumen.     Left Ear: There is impacted cerumen.     Mouth/Throat:     Mouth: Mucous membranes are moist.     Pharynx: Oropharynx is clear.  Eyes:     Conjunctiva/sclera: Conjunctivae normal.  Neck:     Musculoskeletal: Neck supple.  Cardiovascular:     Rate and Rhythm: Normal rate and regular rhythm.     Heart sounds: No murmur.  Pulmonary:     Effort: Pulmonary effort is normal. No respiratory distress.     Breath sounds: Normal breath sounds.  Musculoskeletal: Normal range of motion.  Skin:    General: Skin is warm and dry.  Neurological:     Mental Status: Brenda Russo is alert.  Psychiatric:        Mood and Affect: Mood normal.      UC Treatments / Results  Labs (all labs ordered are listed, but only abnormal results are displayed) Labs Reviewed - No data to display  EKG None  Radiology Dg Chest 2 View  Result Date: 03/17/2018 CLINICAL DATA:  Patient with congestion EXAM: CHEST - 2 VIEW COMPARISON:  Chest radiograph 12/24/2015 FINDINGS: Normal cardiac and mediastinal contours. No consolidative pulmonary opacities. No pleural effusion or pneumothorax. Thoracic spine degenerative changes. IMPRESSION: No acute cardiopulmonary process. Electronically Signed   By: Lovey Newcomer M.D.   On: 03/17/2018 14:32    Procedures Procedures (including critical care time)  Medications Ordered in UC Medications - No data to display  Initial Impression / Assessment and Plan / UC Course  I have reviewed the triage vital signs and the nursing notes.  Pertinent labs & imaging results that were available during my care of the patient were reviewed by me and considered in my medical decision making (see chart for details).     Chest x-ray was negative for any abnormalities This is most likely a viral upper respiratory infection Brenda Russo can keep using over-the-counter medication for symptomatic treatment Follow up as needed for continued or worsening  symptoms\ Final Clinical Impressions(s) / UC Diagnoses   Final diagnoses:  None     Discharge Instructions     Your chest x ray was normal Most likely this is viral.  Keep using the OTC medication as needed.  Follow up as needed for continued or worsening symptoms     ED Prescriptions    None     Controlled Substance Prescriptions [place] Controlled Substance Registry consulted? Not Applicable   Orvan July, NP 03/17/18 1454

## 2018-03-21 ENCOUNTER — Ambulatory Visit: Payer: 59 | Admitting: Internal Medicine

## 2018-03-21 ENCOUNTER — Encounter: Payer: Self-pay | Admitting: Internal Medicine

## 2018-03-21 VITALS — BP 126/70 | HR 81 | Temp 98.1°F | Resp 16 | Ht 65.0 in | Wt 195.5 lb

## 2018-03-21 DIAGNOSIS — R05 Cough: Secondary | ICD-10-CM

## 2018-03-21 DIAGNOSIS — R059 Cough, unspecified: Secondary | ICD-10-CM

## 2018-03-21 MED ORDER — BENZONATATE 200 MG PO CAPS: 200.0000 mg | ORAL_CAPSULE | Freq: Three times a day (TID) | ORAL | 0 refills | Status: DC | PRN

## 2018-03-21 MED ORDER — AZITHROMYCIN 250 MG PO TABS: ORAL_TABLET | ORAL | 0 refills | Status: DC

## 2018-03-21 MED ORDER — AZELASTINE HCL 0.1 % NA SOLN: 2.0000 | Freq: Every evening | NASAL | 3 refills | Status: DC | PRN

## 2018-03-21 MED FILL — BENZONATATE 200 MG CAPS: 200 | 6 days supply | Qty: 20 | Fill #0

## 2018-03-21 MED FILL — AZELASTINE HCL 137 MCG SPRY: 0.1 | 50 days supply | Qty: 30 | Fill #0

## 2018-03-21 MED FILL — AZITHROMYCIN 250 MG TABLET: 250 | 5 days supply | Qty: 6 | Fill #0

## 2018-03-21 NOTE — Patient Instructions (Signed)
   For cough:  Take Mucinex DM   as needed until better If the cough continue: tessalon Perles  Also albuterol for persistent cough (even if no wheezing) is ok   For nasal congestion: Use OTC  Flonase : 2 nasal sprays on each side of the nose in the morning until you feel better Use ASTELIN a prescribed spray : 2 nasal sprays on each side of the nose at night until you feel better   Avoid decongestants such as  Pseudoephedrine or phenylephrine    Take the antibiotic as prescribed: Zithromax    Call if not gradually better over the next  10 days  Call anytime if the symptoms are severe

## 2018-03-21 NOTE — Progress Notes (Signed)
Subjective:    Patient ID: Brenda Russo, female    DOB: 07/19/1955, 63 y.o.   MRN: [id number]  DOS:  03/21/2018 Type of visit - description: acute Symptoms started 5 weeks ago: Had a upper respiratory infections including low-grade fever and cough. Acute symptoms eventually subsided but she has a persistent cough with whitish sputum and nasal congestion with clear discharge. Went to the urgent care 03/17/2018, chest x-ray was negative, was prescribed supportive treatment. She is here because she is not better. Asthma is controlled, has not needed Ventolin.  Review of Systems Denies chest pain, no difficulty breathing No nausea, vomiting, diarrhea No actual wheezing.  Past Medical History:  Diagnosis Date  . Allergic rhinitis   . Anemia   . Anxiety   . Asthma   . HTN (hypertension)   . Hyperlipidemia    borderline  . Obese   . OSA (obstructive sleep apnea)    mild ,no CPAP  . Overweight(278.02)   . Plantar wart    history  . Prediabetes   . Shortness of breath     Past Surgical History:  Procedure Laterality Date  . BREAST BIOPSY Right 09/02/2015  . CESAREAN SECTION    . TUBAL LIGATION      Social History   Socioeconomic History  . Marital status: Married    Spouse name: Not on file  . Number of children: 3  . Years of education: Not on file  . Highest education level: Not on file  Occupational History  . Occupation: Retail banker at [employer]: [employer]: first [employer]  . Financial resource strain: Not on file  . Food insecurity:    Worry: Not on file    Inability: Not on file  . Transportation needs:    Medical: Not on file    Non-medical: Not on file  Tobacco Use  . Smoking status: Never Smoker  . Smokeless tobacco: Never Used  Substance and Sexual Activity  . Alcohol use: No  . Drug use: No  . Sexual activity: Not Currently  Lifestyle  . Physical activity:    Days per week: Not on file    Minutes per  session: Not on file  . Stress: Not on file  Relationships  . Social connections:    Talks on phone: Not on file    Gets together: Not on file    Attends religious service: Not on file    Active member of club or organization: Not on file    Attends meetings of clubs or organizations: Not on file    Relationship status: Not on file  . Intimate partner violence:    Fear of current or ex partner: Not on file    Emotionally abused: Not on file    Physically abused: Not on file    Forced sexual activity: Not on file  Other Topics Concern  . Not on file  Social History Narrative   3 biological children   1 adopted child   3 step-children   Household-- pt , husband and a son      Allergies as of 03/21/2018      Reactions   Eggs Or Egg-derived Products    Shortness of breath, wheezing      Medication List       Accurate as of March 21, 2018 11:59 PM. Always use your most recent med list.  albuterol 108 (90 Base) MCG/ACT inhaler Commonly known as:  VENTOLIN HFA Inhale 1-2 puffs into the lungs every 4 (four) hours as needed for wheezing or shortness of breath.   amLODipine 5 MG tablet Commonly known as:  NORVASC Take 1 tablet (5 mg total) by mouth daily.   azelastine 0.1 % nasal spray Commonly known as:  ASTELIN Place 2 sprays into both nostrils at bedtime as needed for rhinitis. Use in each nostril as directed   azithromycin 250 MG tablet Commonly known as:  ZITHROMAX Z-PAK 2 tabs a day the first day, then 1 tab a day x 4 days   benzonatate 200 MG capsule Commonly known as:  TESSALON Take 1 capsule (200 mg total) by mouth 3 (three) times daily as needed for cough.   CALCIUM CITRATE PLUS/MAGNESIUM Tabs Take 1 tablet by mouth.   COMPLETE WOMENS PO Take 1 capsule by mouth 2 (two) times daily.   CoQ10 100 MG Caps Take 1 capsule by mouth daily.   Ergocalciferol 62.5 MCG (2500 UT) Caps Take 5,000 Units by mouth daily.   Fish Oil 1000 MG Caps Take 1  capsule by mouth.   fluticasone 50 MCG/ACT nasal spray Commonly known as:  FLONASE Place 2 sprays into both nostrils daily.   fluticasone furoate-vilanterol 200-25 MCG/INH Aepb Commonly known as:  BREO ELLIPTA Inhale 1 puff into the lungs daily.   losartan-hydrochlorothiazide 100-12.5 MG tablet Commonly known as:  HYZAAR Take 1 tablet by mouth daily.   magnesium oxide 400 MG tablet Commonly known as:  MAG-OX 2 tablets daily. 400-800 mg daily   meloxicam 7.5 MG tablet Commonly known as:  MOBIC Take 1 tablet (7.5 mg total) by mouth daily.   topiramate 50 MG tablet Commonly known as:  TOPAMAX Take 1 tablet (50 mg total) by mouth daily.   vitamin C 1000 MG tablet Take 1,000 mg by mouth daily.           Objective:   Physical Exam BP 126/70 (BP Location: Left Arm, Patient Position: Sitting, Cuff Size: Small)   Pulse 81   Temp 98.1 F (36.7 C) (Oral)   Resp 16   Ht 5\' 5"  (1.651 m)   Wt 195 lb 8 oz (88.7 kg)   SpO2 98%   BMI 32.53 kg/m  General:   Well developed, NAD, BMI noted. HEENT:  Normocephalic . Face symmetric, atraumatic.  Unable to see TMs due to wax.  Nose congested, sinuses no TTP, throat symmetric and not red Lungs:  CTA B Normal respiratory effort, no intercostal retractions, no accessory muscle use. Heart: RRR,  no murmur.  No pretibial edema bilaterally  Skin: Not pale. Not jaundice Neurologic:  alert & oriented X3.  Speech normal, gait appropriate for age and unassisted Psych--  Cognition and judgment appear intact.  Cooperative with normal attention span and concentration.  Behavior appropriate. No anxious or depressed appearing.      Assessment     Assessment Prediabetes A1c 6.0 (05/2014) HTN Sinus bradycardia-PVCs: Saw cardiology 04/26/2017. She was asx RTC prn Hyperlipidemia Anxiety Obesity Asthma - h/o intubation x2 in the 90s  OSA, no CPAP BTL LLQ abd pain on-off , last GI visit 11-2015, CT abd (-)  PLAN: Cough:  This is  going on for 5 weeks, exam is benign, recent chest x-ray negative.  Atypical bronchitis?. Plan:  Nasal congestion control with Flonase and Astelin. Cough control with Mucinex DM, Tessalon Perles. For asthma, continue Breo Ellipta, use albuterol as needed for wheezing or  persistent cough. Atypical bronchitis?: rx  Zithromax. Call if not better, see AVS

## 2018-03-21 NOTE — Progress Notes (Signed)
Pre visit review using our clinic review tool, if applicable. No additional management support is needed unless otherwise documented below in the visit note. 

## 2018-03-22 NOTE — Assessment & Plan Note (Signed)
Cough:  This is going on for 5 weeks, exam is benign, recent chest x-ray negative.  Atypical bronchitis?. Plan:  Nasal congestion control with Flonase and Astelin. Cough control with Mucinex DM, Tessalon Perles. For asthma, continue Breo Ellipta, use albuterol as needed for wheezing or persistent cough. Atypical bronchitis?: rx  Zithromax. Call if not better, see AVS

## 2018-03-27 ENCOUNTER — Other Ambulatory Visit: Payer: Self-pay | Admitting: Internal Medicine

## 2018-03-27 ENCOUNTER — Ambulatory Visit (INDEPENDENT_AMBULATORY_CARE_PROVIDER_SITE_OTHER): Payer: 59 | Admitting: Family Medicine

## 2018-03-27 ENCOUNTER — Encounter (INDEPENDENT_AMBULATORY_CARE_PROVIDER_SITE_OTHER): Payer: Self-pay | Admitting: Family Medicine

## 2018-03-27 VITALS — BP 127/71 | HR 61 | Temp 97.9°F | Ht 65.0 in | Wt 193.0 lb

## 2018-03-27 DIAGNOSIS — E559 Vitamin D deficiency, unspecified: Secondary | ICD-10-CM

## 2018-03-27 DIAGNOSIS — J069 Acute upper respiratory infection, unspecified: Secondary | ICD-10-CM

## 2018-03-27 DIAGNOSIS — E7849 Other hyperlipidemia: Secondary | ICD-10-CM | POA: Diagnosis not present

## 2018-03-27 DIAGNOSIS — F3289 Other specified depressive episodes: Secondary | ICD-10-CM

## 2018-03-27 DIAGNOSIS — E88819 Insulin resistance, unspecified: Secondary | ICD-10-CM

## 2018-03-27 DIAGNOSIS — Z6837 Body mass index (BMI) 37.0-37.9, adult: Secondary | ICD-10-CM | POA: Diagnosis not present

## 2018-03-27 DIAGNOSIS — E8881 Metabolic syndrome: Secondary | ICD-10-CM | POA: Diagnosis not present

## 2018-03-27 DIAGNOSIS — Z6832 Body mass index (BMI) 32.0-32.9, adult: Secondary | ICD-10-CM | POA: Diagnosis not present

## 2018-03-27 DIAGNOSIS — Z9189 Other specified personal risk factors, not elsewhere classified: Secondary | ICD-10-CM | POA: Diagnosis not present

## 2018-03-27 DIAGNOSIS — E669 Obesity, unspecified: Secondary | ICD-10-CM | POA: Diagnosis not present

## 2018-03-27 DIAGNOSIS — E66811 Obesity, class 1: Secondary | ICD-10-CM

## 2018-03-27 MED ORDER — TOPIRAMATE 50 MG PO TABS: 50.0000 mg | ORAL_TABLET | Freq: Every day | ORAL | 0 refills | Status: DC

## 2018-03-27 MED ORDER — FLUTICASONE PROPIONATE 50 MCG/ACT NA SUSP: 2.0000 | Freq: Every day | NASAL | 5 refills | Status: DC

## 2018-03-27 MED FILL — LOSARTAN POTASSIUM 100 MG T: 100 | 90 days supply | Qty: 90 | Fill #0

## 2018-03-27 MED FILL — TOPIRAMATE 50 MG TABLET: 50 | 30 days supply | Qty: 30 | Fill #0

## 2018-03-27 MED FILL — AMLODIPINE BESYLATE 5 MG TA: 5 | 90 days supply | Qty: 90 | Fill #3

## 2018-03-27 MED FILL — VENTOLIN HFA 90 MCG INHALER: 108 (90 BAS | 17 days supply | Qty: 18 | Fill #0

## 2018-03-27 MED FILL — HYDROCHLOROTHIAZIDE 12.5 MG: 12.5 | 90 days supply | Qty: 90 | Fill #0

## 2018-03-27 MED FILL — BREO ELLIPTA 200-25 MCG INH: 200-25 | 90 days supply | Qty: 180 | Fill #3

## 2018-03-27 MED FILL — FLUTICASONE PROP 50 MCG SPR: 50 | 30 days supply | Qty: 16 | Fill #0

## 2018-03-27 NOTE — Progress Notes (Signed)
Office: [phone]  /  Fax: [phone]   HPI:   Chief Complaint: OBESITY Brenda Russo is here to discuss her progress with her obesity treatment plan. She is keeping a food journal with 1200 calories and 75+ grams of protein and is following her eating plan approximately 50 % of the time. She states she is walking 1200 to 1700 steps 1 time per week. Brenda Russo has been sick and off the plan.  She is headed to [name] for 2 weeks on 04/02/18 and feels she will likely not be able to stay on plan.  Her weight is 193 lb (87.5 kg) today and has had a weight gain of 1 pound over a period of 3 to 4 weeks since her last visit. She has lost 7 lbs since starting treatment with [name].  Vitamin D deficiency Brenda Russo has a diagnosis of vitamin D deficiency. She is currently taking OTC vit D 5,000 IU per day and vit D level is at [name] but not over-replaced (85.3). She denies nausea, vomiting, or muscle weakness.  At risk for osteopenia and osteoporosis Brenda Russo is at higher risk of osteopenia and osteoporosis due to vitamin D deficiency.   Depression with emotional eating behaviors Brenda Russo is struggling with emotional eating and using food for comfort to the extent that it is negatively impacting her health. She often snacks when she is not hungry. Brenda Russo sometimes feels she is out of control and then feels guilty that she made poor food choices. She has been working on behavior modification techniques to help reduce her emotional eating and has been somewhat successful. Brenda Russo notes that Topamax is helping with cravings.  Upper Respiratory Infection Eh notes a runny nose and chest congestion. She is getting over an upper respiratory infection. Flonase helps with her runny nose. URI is improving.  Hyperlipidemia Brenda Russo has hyperlipidemia. Her last LDL was 106, HDL was 67, and triglycerides was 39 on 05/09/17. She has been trying to improve her cholesterol levels with intensive lifestyle modification  including a low saturated fat diet, exercise and weight loss. She is not on a statin and denies any chest pain or shortness of breath.  Insulin Resistance Brenda Russo has a diagnosis of insulin resistance based on her elevated fasting insulin level >5. Although Brenda Russo's blood glucose readings are still under good control, insulin resistance puts her at greater risk of metabolic syndrome and diabetes. She is not taking metformin currently and denies polyphagia. She continues to work on diet and exercise to decrease risk of diabetes. Lab Results  Component Value Date   HGBA1C 5.5 10/10/2017     ASSESSMENT AND PLAN:  Vitamin D deficiency - Plan: VITAMIN D 25 Hydroxy (Vit-D Deficiency, Fractures)  Viral upper respiratory tract infection - Plan: fluticasone (FLONASE) 50 MCG/ACT nasal spray  Other hyperlipidemia - Plan: Lipid Panel With LDL/HDL Ratio  Insulin resistance - Plan: Comprehensive metabolic panel, Hemoglobin A1c, Insulin, random  Other depression - with emotional eating - Plan: topiramate (TOPAMAX) 50 MG tablet  At risk for osteoporosis  Class 1 obesity with serious comorbidity and body mass index (BMI) of 32.0 to 32.9 in adult, unspecified obesity type  PLAN:  Vitamin D Deficiency Brenda Russo was informed that low vitamin D levels contributes to fatigue and are associated with obesity, breast, and colon cancer. She agrees to continue to take OTC Vit D @5 ,000 IU every day and will follow up for routine testing of vitamin D, at least 2-3 times per year. She was informed of the risk of over-replacement  of vitamin D and agrees to not increase her dose unless she discusses this with [name] first. We will check her vitamin D level today and she will follow up in 3 to 4 weeks.  At risk for osteopenia and osteoporosis Brenda Russo was given extended (15 minutes) osteoporosis prevention counseling today. Brenda Russo is at risk for osteopenia and osteoporosis due to her vitamin D deficiency. She was  encouraged to take her vitamin D and follow her higher calcium diet and increase strengthening exercise to help strengthen her bones and decrease her risk of osteopenia and osteoporosis.  Upper Respiratory Infection Brenda Russo agrees to continue Flonase 1 bottle, 2 sprays each nostril daily # 5 refills and will follow up at the agreed upon time.  Hyperlipidemia Brenda Russo was informed of the American Heart Association Guidelines emphasizing intensive lifestyle modifications as the first line treatment for hyperlipidemia. We discussed many lifestyle modifications today in depth, and Brenda Russo will continue to work on decreasing saturated fats such as fatty red meat, butter and many fried foods. She will also increase vegetables and lean protein in her diet and continue to work on exercise and weight loss efforts. A FLP was ordered today and [name] agrees to follow up in 3 to 4 weeks.  Insulin Resistance Brenda Russo will continue to work on weight loss, exercise, and decreasing simple carbohydrates in her diet to help decrease the risk of diabetes. She was informed that eating too many simple carbohydrates or too many calories at one sitting increases the likelihood of GI side effects. We will obtain a fasting glucose, fasting Insulin, and an A1c today and Brenda Russo agreed to follow up with [name] as directed to monitor her progress.  Depression with Emotional Eating Behaviors We discussed behavior modification techniques today to help Brenda Russo deal with her emotional eating and depression. She has agreed to continue to take Topamax 50mg  qhs #30 with no refills and she agreed to follow up as directed.  Obesity Brenda Russo is currently in the action stage of change. As such, her goal is to continue with weight loss efforts. She has agreed to keep a food journal with 1200 calories and 75 grams of protein daily. Brenda Russo has been instructed to continue walking 1200 to 1700 steps daily. We discussed the following  Behavioral Modification Strategies today: increasing lean protein intake, increasing vegetables, planning for success, and travel eating strategies.   Brenda Russo has agreed to follow up with our clinic in 3 to 4 weeks. She was informed of the importance of frequent follow up visits to maximize her success with intensive lifestyle modifications for her multiple health conditions.  ALLERGIES: Allergies  Allergen Reactions  . Eggs Or Egg-Derived Products     Shortness of breath, wheezing    MEDICATIONS: Current Outpatient Medications on File Prior to Visit  Medication Sig Dispense Refill  . albuterol (VENTOLIN HFA) 108 (90 Base) MCG/ACT inhaler Inhale 1-2 puffs into the lungs every 4 (four) hours as needed for wheezing or shortness of breath. 54 g 1  . amLODipine (NORVASC) 5 MG tablet Take 1 tablet (5 mg total) by mouth daily. 90 tablet 3  . Ascorbic Acid (VITAMIN C) 1000 MG tablet Take 1,000 mg by mouth daily.    Marland Kitchen azelastine (ASTELIN) 0.1 % nasal spray Place 2 sprays into both nostrils at bedtime as needed for rhinitis. Use in each nostril as directed 30 mL 3  . Coenzyme Q10 (COQ10) 100 MG CAPS Take 1 capsule by mouth daily.    . Ergocalciferol 2500 units CAPS  Take 5,000 Units by mouth daily. 30 capsule 0  . fluticasone furoate-vilanterol (BREO ELLIPTA) 200-25 MCG/INH AEPB Inhale 1 puff into the lungs daily. 180 each 3  . losartan-hydrochlorothiazide (HYZAAR) 100-12.5 MG tablet Take 1 tablet by mouth daily. 90 tablet 2  . magnesium oxide (MAG-OX) 400 MG tablet 2 tablets daily. 400-800 mg daily    . meloxicam (MOBIC) 7.5 MG tablet Take 1 tablet (7.5 mg total) by mouth daily. 30 tablet 0  . Multiple Minerals-Vitamins (CALCIUM CITRATE PLUS/MAGNESIUM) TABS Take 1 tablet by mouth.    . Multiple Vitamins-Minerals (COMPLETE WOMENS PO) Take 1 capsule by mouth 2 (two) times daily.      . Omega-3 Fatty Acids (FISH OIL) 1000 MG CAPS Take 1 capsule by mouth.     No current facility-administered  medications on file prior to visit.     PAST MEDICAL HISTORY: Past Medical History:  Diagnosis Date  . Allergic rhinitis   . Anemia   . Anxiety   . Asthma   . HTN (hypertension)   . Hyperlipidemia    borderline  . Obese   . OSA (obstructive sleep apnea)    mild ,no CPAP  . Overweight(278.02)   . Plantar wart    history  . Prediabetes   . Shortness of breath     PAST SURGICAL HISTORY: Past Surgical History:  Procedure Laterality Date  . BREAST BIOPSY Right 09/02/2015  . CESAREAN SECTION    . TUBAL LIGATION      SOCIAL HISTORY: Social History   Tobacco Use  . Smoking status: Never Smoker  . Smokeless tobacco: Never Used  Substance Use Topics  . Alcohol use: No  . Drug use: No    FAMILY HISTORY: Family History  Problem Relation Age of Onset  . Heart disease Mother        pacemaker   . Hypertension Mother   . Hyperlipidemia Mother   . Obesity Mother   . Asthma Father   . Pancreatic cancer Brother   . Breast cancer Maternal Aunt   . Colon cancer Neg Hx     ROS: Review of Systems  Constitutional: Negative for weight loss.  HENT:       Positive for runny nose.  Respiratory: Negative for shortness of breath.        Positive for chest congestion.  Cardiovascular: Negative for chest pain.  Gastrointestinal: Negative for nausea and vomiting.  Musculoskeletal:       Negative for muscle weakness.  Endo/Heme/Allergies:       Negative for polyphagia.    PHYSICAL EXAM: Blood pressure 127/71, pulse 61, temperature 97.9 F (36.6 C), temperature source Oral, height 5\' 5"  (1.651 m), weight 193 lb (87.5 kg), SpO2 98 %. Body mass index is 32.12 kg/m. Physical Exam Vitals signs reviewed.  Constitutional:      Appearance: Normal appearance. She is obese.  Cardiovascular:     Rate and Rhythm: Normal rate.  Pulmonary:     Effort: Pulmonary effort is normal.  Musculoskeletal: Normal range of motion.  Skin:    General: Skin is warm and dry.  Neurological:      Mental Status: She is alert and oriented to person, place, and time.  Psychiatric:        Mood and Affect: Mood normal.        Behavior: Behavior normal.     RECENT LABS AND TESTS: BMET    Component Value Date/Time   NA 140 10/10/2017 1040   K 4.5 10/10/2017  1040   CL 102 10/10/2017 1040   CO2 26 10/10/2017 1040   GLUCOSE 93 10/10/2017 1040   GLUCOSE 100 (H) 06/03/2015 0747   BUN 13 10/10/2017 1040   CREATININE 0.74 10/10/2017 1040   CALCIUM 9.1 10/10/2017 1040   GFRNONAA 88 10/10/2017 1040   GFRAA 102 10/10/2017 1040   Lab Results  Component Value Date   HGBA1C 5.5 10/10/2017   HGBA1C 5.6 05/09/2017   HGBA1C 5.6 12/06/2016   HGBA1C 5.6 05/17/2016   HGBA1C 5.9 09/22/2015   Lab Results  Component Value Date   INSULIN 6.1 10/10/2017   INSULIN 6.1 05/09/2017   INSULIN 11.2 12/06/2016   INSULIN 9.8 05/17/2016   CBC    Component Value Date/Time   WBC 6.3 05/09/2017 0917   WBC 6.5 11/13/2012 1003   RBC 4.73 05/09/2017 0917   RBC 5.03 11/13/2012 1003   HGB 14.0 05/09/2017 0917   HCT 42.8 05/09/2017 0917   PLT 315 05/09/2017 0917   MCV 91 05/09/2017 0917   MCH 29.6 05/09/2017 0917   MCH 26.9 (A) 05/20/2012 1404   MCHC 32.7 05/09/2017 0917   MCHC 33.5 11/13/2012 1003   RDW 13.3 05/09/2017 0917   LYMPHSABS 1.8 05/09/2017 0917   MONOABS 0.4 11/13/2012 1003   EOSABS 0.1 05/09/2017 0917   BASOSABS 0.0 05/09/2017 0917   Iron/TIBC/Ferritin/ %Sat    Component Value Date/Time   IRON 88 05/20/2012 1422   TIBC 303 05/20/2012 1422   FERRITIN 80 05/20/2012 1422   IRONPCTSAT 29 05/20/2012 1422   Lipid Panel     Component Value Date/Time   CHOL 181 05/09/2017 0917   TRIG 39 05/09/2017 0917   HDL 67 05/09/2017 0917   CHOLHDL 2.7 05/09/2017 0917   CHOLHDL 3 06/03/2015 0747   VLDL 9.0 06/03/2015 0747   LDLCALC 106 (H) 05/09/2017 0917   Hepatic Function Panel     Component Value Date/Time   PROT 7.0 10/10/2017 1040   ALBUMIN 4.0 10/10/2017 1040   AST 22  10/10/2017 1040   ALT 19 10/10/2017 1040   ALKPHOS 92 10/10/2017 1040   BILITOT 0.4 10/10/2017 1040   BILIDIR 0.1 11/13/2012 1003      Component Value Date/Time   TSH 2.460 05/09/2017 0917   TSH 1.770 05/17/2016 0949   TSH 1.23 11/13/2012 1003   Results for MARGARETHE, VIRGEN (MRN [id number]) as of 03/27/2018 13:41  Ref. Range 10/10/2017 10:40  Vitamin D, 25-Hydroxy Latest Ref Range: 30.0 - 100.0 ng/mL 85.3   OBESITY BEHAVIORAL INTERVENTION VISIT  Today's visit was # 29   Starting weight: 200 lbs Starting date: 05/17/16 Today's weight : Weight: 193 lb (87.5 kg)  Today's date: 03/27/2018 Total lbs lost to date: 7  ASK: We discussed the diagnosis of obesity with [name] today and Angles agreed to give [name] permission to discuss obesity behavioral modification therapy today.  ASSESS: Denya has the diagnosis of obesity and her BMI today is 32.1. Milly is in the action stage of change.   ADVISE: Maytte was educated on the multiple health risks of obesity as well as the benefit of weight loss to improve her health. She was advised of the need for long term treatment and the importance of lifestyle modifications to improve her current health and to decrease her risk of future health problems.  AGREE: Multiple dietary modification options and treatment options were discussed and Robert agreed to follow the recommendations documented in the above note.  ARRANGE: Shatoria was educated on  the importance of frequent visits to treat obesity as outlined per CMS and USPSTF guidelines and agreed to schedule her next follow up appointment today.  I, Marcille Blanco, CMA, am acting as Location manager for [employer], FNP-C.  I have reviewed the above documentation for accuracy and completeness, and I agree with the above.  - Luisenrique Conran, FNP-C.

## 2018-03-28 ENCOUNTER — Encounter: Payer: Self-pay | Admitting: Internal Medicine

## 2018-03-28 LAB — COMPREHENSIVE METABOLIC PANEL
ALT: 12 IU/L (ref 0–32)
AST: 17 IU/L (ref 0–40)
Albumin/Globulin Ratio: 1.1 — ABNORMAL LOW (ref 1.2–2.2)
Albumin: 3.9 g/dL (ref 3.8–4.8)
Alkaline Phosphatase: 89 IU/L (ref 39–117)
BUN/Creatinine Ratio: 20 (ref 12–28)
BUN: 16 mg/dL (ref 8–27)
Bilirubin Total: 0.7 mg/dL (ref 0.0–1.2)
CO2: 21 mmol/L (ref 20–29)
Calcium: 9.2 mg/dL (ref 8.7–10.3)
Chloride: 106 mmol/L (ref 96–106)
Creatinine, Ser: 0.8 mg/dL (ref 0.57–1.00)
GFR calc Af Amer: 92 mL/min/{1.73_m2} (ref >59)
GFR calc non Af Amer: 80 mL/min/{1.73_m2} (ref >59)
Globulin, Total: 3.4 g/dL (ref 1.5–4.5)
Glucose: 95 mg/dL (ref 65–99)
Potassium: 4.2 mmol/L (ref 3.5–5.2)
Sodium: 142 mmol/L (ref 134–144)
Total Protein: 7.3 g/dL (ref 6.0–8.5)

## 2018-03-28 LAB — INSULIN, RANDOM: INSULIN: 7.8 u[IU]/mL (ref 2.6–24.9)

## 2018-03-28 LAB — LIPID PANEL WITH LDL/HDL RATIO
Cholesterol, Total: 190 mg/dL (ref 100–199)
HDL: 66 mg/dL (ref >39)
LDL Calculated: 115 mg/dL — ABNORMAL HIGH (ref 0–99)
LDl/HDL Ratio: 1.7 ratio (ref 0.0–3.2)
Triglycerides: 45 mg/dL (ref 0–149)
VLDL Cholesterol Cal: 9 mg/dL (ref 5–40)

## 2018-03-28 LAB — HEMOGLOBIN A1C
Est. average glucose Bld gHb Est-mCnc: 114 mg/dL
Hgb A1c MFr Bld: 5.6 % (ref 4.8–5.6)

## 2018-03-28 LAB — VITAMIN D 25 HYDROXY (VIT D DEFICIENCY, FRACTURES): Vit D, 25-Hydroxy: 44.3 ng/mL (ref 30.0–100.0)

## 2018-03-29 NOTE — Telephone Encounter (Signed)
Spoke w/ Pt- recommended she keep appt tomorrow w/ PCP.

## 2018-03-29 NOTE — Telephone Encounter (Signed)
Copied from [place] [phone]. Topic: Quick Communication - See Telephone Encounter >> Mar 29, 2018 12:43 PM Vernona Rieger wrote: CRM for notification. See Telephone encounter for: 03/29/18.  Patient is going out of the country on Monday and would like to know could the medications be refilled? She will be in [name] within those 5-7 days, call back @ [phone]

## 2018-03-30 ENCOUNTER — Ambulatory Visit: Payer: 59 | Admitting: Internal Medicine

## 2018-03-30 ENCOUNTER — Encounter: Payer: Self-pay | Admitting: Internal Medicine

## 2018-03-30 VITALS — BP 124/80 | HR 74 | Temp 97.9°F | Resp 16 | Ht 65.0 in | Wt 201.4 lb

## 2018-03-30 DIAGNOSIS — R05 Cough: Secondary | ICD-10-CM

## 2018-03-30 DIAGNOSIS — R059 Cough, unspecified: Secondary | ICD-10-CM

## 2018-03-30 MED ORDER — PREDNISONE 10 MG PO TABS: ORAL_TABLET | ORAL | 0 refills | Status: DC

## 2018-03-30 MED ORDER — DOXYCYCLINE HYCLATE 100 MG PO TABS: 100.0000 mg | ORAL_TABLET | Freq: Two times a day (BID) | ORAL | 0 refills | Status: DC

## 2018-03-30 MED ORDER — BENZONATATE 200 MG PO CAPS: 200.0000 mg | ORAL_CAPSULE | Freq: Three times a day (TID) | ORAL | 0 refills | Status: DC | PRN

## 2018-03-30 MED FILL — DOXYCYCLINE HYCLATE 100 MG: 100 | 8 days supply | Qty: 15 | Fill #0

## 2018-03-30 MED FILL — BENZONATATE 200 MG CAPS: 200 | 14 days supply | Qty: 40 | Fill #0

## 2018-03-30 MED FILL — predniSONE 10 MG TABS: 10 | 9 days supply | Qty: 18 | Fill #0

## 2018-03-30 NOTE — Patient Instructions (Signed)
Continue your 2 asthma inhalers  Continue the 2 nasal sprays  For cough, continue Mucinex DM and Tessalon Perles  Start taking the prednisone  You have antibiotic in case you are not improving in the next few days  If you get sicker and you are not in the country, please seek medical attention

## 2018-03-30 NOTE — Progress Notes (Signed)
Pre visit review using our clinic review tool, if applicable. No additional management support is needed unless otherwise documented below in the visit note. 

## 2018-03-30 NOTE — Progress Notes (Signed)
Subjective:    Patient ID: Brenda Russo, female    DOB: Apr 07, 1955, 63 y.o.   MRN: [id number]  DOS:  03/30/2018 Type of visit - description: Acute visit Few days ago she was seen here with cough, question of atypical bronchitis, was prescribed Zithromax. She is back because although she is better still has occasional coughing spells and typically brings foamy mucus. She is leaving the country in a couple of days and would like some help. Asthma seems well controlled.  Review of Systems Denies fever chills Does have some sinus congestion.  Mild discharge Admits to some itchy eyes and nose.  No sneezing. Had mild heartburn only for the last couple of days.    Past Medical History:  Diagnosis Date  . Allergic rhinitis   . Anemia   . Anxiety   . Asthma   . HTN (hypertension)   . Hyperlipidemia    borderline  . Obese   . OSA (obstructive sleep apnea)    mild ,no CPAP  . Overweight(278.02)   . Plantar wart    history  . Prediabetes   . Shortness of breath     Past Surgical History:  Procedure Laterality Date  . BREAST BIOPSY Right 09/02/2015  . CESAREAN SECTION    . TUBAL LIGATION      Social History   Socioeconomic History  . Marital status: Married    Spouse name: Not on file  . Number of children: 3  . Years of education: Not on file  . Highest education level: Not on file  Occupational History  . Occupation: Retail banker at [employer]: [place]: first [employer] [place]  . Financial resource strain: Not on file  . Food insecurity:    Worry: Not on file    Inability: Not on file  . Transportation needs:    Medical: Not on file    Non-medical: Not on file  Tobacco Use  . Smoking status: Never Smoker  . Smokeless tobacco: Never Used  Substance and Sexual Activity  . Alcohol use: No  . Drug use: No  . Sexual activity: Not Currently  Lifestyle  . Physical activity:    Days per week: Not on file    Minutes per  session: Not on file  . Stress: Not on file  Relationships  . Social connections:    Talks on phone: Not on file    Gets together: Not on file    Attends religious service: Not on file    Active member of club or organization: Not on file    Attends meetings of clubs or organizations: Not on file    Relationship status: Not on file  . Intimate partner violence:    Fear of current or ex partner: Not on file    Emotionally abused: Not on file    Physically abused: Not on file    Forced sexual activity: Not on file  Other Topics Concern  . Not on file  Social History Narrative   3 biological children   1 adopted child   3 step-children   Household-- pt , husband and a son      Allergies as of 03/30/2018      Reactions   Eggs Or Egg-derived Products    Shortness of breath, wheezing      Medication List       Accurate as of March 30, 2018 11:59 PM. Always use your  most recent med list.        albuterol 108 (90 Base) MCG/ACT inhaler Commonly known as:  VENTOLIN HFA Inhale 1-2 puffs into the lungs every 4 (four) hours as needed for wheezing or shortness of breath.   amLODipine 5 MG tablet Commonly known as:  NORVASC Take 1 tablet (5 mg total) by mouth daily.   azelastine 0.1 % nasal spray Commonly known as:  ASTELIN Place 2 sprays into both nostrils at bedtime as needed for rhinitis. Use in each nostril as directed   benzonatate 200 MG capsule Commonly known as:  TESSALON Take 1 capsule (200 mg total) by mouth 3 (three) times daily as needed for cough.   CALCIUM CITRATE PLUS/MAGNESIUM Tabs Take 1 tablet by mouth.   COMPLETE WOMENS PO Take 1 capsule by mouth 2 (two) times daily.   CoQ10 100 MG Caps Take 1 capsule by mouth daily.   doxycycline 100 MG tablet Commonly known as:  VIBRA-TABS Take 1 tablet (100 mg total) by mouth 2 (two) times daily.   Ergocalciferol 62.5 MCG (2500 UT) Caps Take 5,000 Units by mouth daily.   Fish Oil 1000 MG Caps Take 1  capsule by mouth.   fluticasone 50 MCG/ACT nasal spray Commonly known as:  FLONASE Place 2 sprays into both nostrils daily.   fluticasone furoate-vilanterol 200-25 MCG/INH Aepb Commonly known as:  BREO ELLIPTA Inhale 1 puff into the lungs daily.   losartan-hydrochlorothiazide 100-12.5 MG tablet Commonly known as:  HYZAAR Take 1 tablet by mouth daily.   magnesium oxide 400 MG tablet Commonly known as:  MAG-OX 2 tablets daily. 400-800 mg daily   meloxicam 7.5 MG tablet Commonly known as:  MOBIC Take 1 tablet (7.5 mg total) by mouth daily.   predniSONE 10 MG tablet Commonly known as:  DELTASONE 3 tabs x 3 days, 2 tabs x 3 days, 1 tab x 3 days   topiramate 50 MG tablet Commonly known as:  TOPAMAX Take 1 tablet (50 mg total) by mouth daily.   vitamin C 1000 MG tablet Take 1,000 mg by mouth daily.           Objective:   Physical Exam BP 124/80 (BP Location: Right Arm, Patient Position: Sitting, Cuff Size: Normal)   Pulse 74   Temp 97.9 F (36.6 C) (Oral)   Resp 16   Ht 5\' 5"  (1.651 m)   Wt 201 lb 6 oz (91.3 kg)   SpO2 96%   BMI 33.51 kg/m  General:   Well developed, NAD, BMI noted. HEENT:  Normocephalic . Face symmetric, atraumatic. Nose not congested, bilateral steroid impaction. Throat symmetric and not red Lungs:  CTA B Normal respiratory effort, no intercostal retractions, no accessory muscle use. Heart: RRR,  no murmur.  No pretibial edema bilaterally  Skin: Not pale. Not jaundice Neurologic:  alert & oriented X3.  Speech normal, gait appropriate for age and unassisted Psych--  Cognition and judgment appear intact.  Cooperative with normal attention span and concentration.  Behavior appropriate. No anxious or depressed appearing.      Assessment     Assessment Prediabetes A1c 6.0 (05/2014) HTN Sinus bradycardia-PVCs: Saw cardiology 04/26/2017. She was asx RTC prn Hyperlipidemia Anxiety Obesity Asthma - h/o intubation x2 in the 90s  OSA,  no CPAP BTL LLQ abd pain on-off , last GI visit 11-2015, CT abd (-)  PLAN: Cough: See last visit, had a Z-Pak, cough improved but is not completely better.  He still has spells of persistent cough  with foamy sputum.  She has a history of asthma.  She is leaving the country in a couple of days and needs some help. Plan: Continue Breo Ellipta, albuterol as needed, Flonase, Astelin. There might be some allergic component, reports itchy eyes and nose, add Zyrtec Continue Mucinex and  Tessalon Perles  We will give her a round of prednisone hoping to decrease the mucus burden. A prescription for doxycycline was sent, she is to take it if symptoms are ongoing.  Aware of the risk of GI symptoms. If she gets much sicker, needs to seek medical help during her trip.

## 2018-04-01 NOTE — Assessment & Plan Note (Signed)
Cough: See last visit, had a Z-Pak, cough improved but is not completely better.  He still has spells of persistent cough with foamy sputum.  She has a history of asthma.  She is leaving the country in a couple of days and needs some help. Plan: Continue Breo Ellipta, albuterol as needed, Flonase, Astelin. There might be some allergic component, reports itchy eyes and nose, add Zyrtec Continue Mucinex and  Tessalon Perles  We will give her a round of prednisone hoping to decrease the mucus burden. A prescription for doxycycline was sent, she is to take it if symptoms are ongoing.  Aware of the risk of GI symptoms. If she gets much sicker, needs to seek medical help during her trip.

## 2018-04-18 ENCOUNTER — Ambulatory Visit (INDEPENDENT_AMBULATORY_CARE_PROVIDER_SITE_OTHER): Payer: 59 | Admitting: Family Medicine

## 2018-04-18 ENCOUNTER — Encounter (INDEPENDENT_AMBULATORY_CARE_PROVIDER_SITE_OTHER): Payer: Self-pay | Admitting: Family Medicine

## 2018-04-18 ENCOUNTER — Other Ambulatory Visit: Payer: Self-pay

## 2018-04-18 VITALS — BP 121/72 | HR 68 | Temp 97.8°F | Ht 65.0 in | Wt 197.0 lb

## 2018-04-18 DIAGNOSIS — E669 Obesity, unspecified: Secondary | ICD-10-CM

## 2018-04-18 DIAGNOSIS — F3289 Other specified depressive episodes: Secondary | ICD-10-CM | POA: Diagnosis not present

## 2018-04-18 DIAGNOSIS — Z6832 Body mass index (BMI) 32.0-32.9, adult: Secondary | ICD-10-CM | POA: Diagnosis not present

## 2018-04-18 DIAGNOSIS — E559 Vitamin D deficiency, unspecified: Secondary | ICD-10-CM | POA: Diagnosis not present

## 2018-04-18 DIAGNOSIS — Z9189 Other specified personal risk factors, not elsewhere classified: Secondary | ICD-10-CM | POA: Diagnosis not present

## 2018-04-18 NOTE — Progress Notes (Signed)
Office: [phone]  /  Fax: [phone]   HPI:   Chief Complaint: OBESITY Brenda Russo is here to discuss her progress with her obesity treatment plan. She is keeping a food journal with 1200 calories and 75 grams of protein and is following her eating plan approximately 10% of the time. She states she is walking 30 minutes 7 times per week. Brenda Russo states she got back from a trip to [name] 3 days ago. She reports she ate what was available on the trip. She is not yet back on track. She states she is not journaling yet.  Her weight is 197 lb (89.4 kg) today and has had a weight gain of 4 lbs since her last visit. She has lost 3 lbs since starting treatment with [name].  Vitamin D deficiency Brenda Russo has a diagnosis of Vitamin D deficiency. Her Vitamin D level has decreased since going off of prescription Vitamin D and is now 44.3. She currently takes 5000 IU of OTC vit D sporadically.  At risk for osteopenia and osteoporosis Brenda Russo is at higher risk of osteopenia and osteoporosis due to Vitamin D deficiency.   Depression with emotional eating behaviors Brenda Russo is struggling with emotional eating and using food for comfort to the extent that it is negatively impacting her health. She is on Topamax but is unsure if it is helping. She often snacks when she is not hungry. Brenda Russo sometimes feels she is out of control and then feels guilty that she made poor food choices. She has been working on behavior modification techniques to help reduce her emotional eating and has been somewhat successful.  She shows no sign of suicidal or homicidal ideations.  Depression screen [facility] 2/9 05/17/2016 04/12/2016 12/22/2015 11/21/2015 11/18/2015  Decreased Interest 0 0 0 0 0  Down, Depressed, Hopeless 1 0 0 0 0  PHQ - 2 Score 1 0 0 0 0  Altered sleeping 2 - - - -  Tired, decreased energy 1 - - - -  Change in appetite 2 - - - -  Feeling bad or failure about yourself  0 - - - -  Trouble concentrating 2 - - - -   Moving slowly or fidgety/restless 0 - - - -  Suicidal thoughts 0 - - - -  PHQ-9 Score 8 - - - -   ASSESSMENT AND PLAN:  Vitamin D deficiency  Other depression - with emotional eating  At risk for osteoporosis  Class 1 obesity with serious comorbidity and body mass index (BMI) of 32.0 to 32.9 in adult, unspecified obesity type  PLAN:  Vitamin D Deficiency Gracilyn was informed that low Vitamin D levels contributes to fatigue and are associated with obesity, breast, and colon cancer. She agrees to take Vit D3 5,000 IU qod and will follow-up for routine testing of Vitamin D, at least 2-3 times per year. She was informed of the risk of over-replacement of Vitamin D and agrees to not increase her dose unless she discusses this with [name] first. Tamyrah agrees to follow-up with our clinic in 3 weeks.  At risk for osteopenia and osteoporosis Suhaila was given extended  (15 minutes) osteoporosis prevention counseling today. Talula is at risk for osteopenia and osteoporsis due to her Vitamin D deficiency. She was encouraged to take her Vitamin D and follow her higher calcium diet and increase strengthening exercise to help strengthen her bones and decrease her risk of osteopenia and osteoporosis.  Depression with Emotional Eating Behaviors We discussed behavior modification techniques today  to help Brenda Russo deal with her emotional eating and depression. Keiosha will discontinue Topamax by taking 1/2 tablet for 4 days and then discontinue.  Obesity Brenda Russo is currently in the action stage of change. As such, her goal is to continue with weight loss efforts. She has agreed to follow the Category 2 plan or will journal 1200 calories + 75 grams of protein daily. Handouts were provided for Category 2 plan and Smart Fruit. Brenda Russo will continue current exercise regimen for weight loss and overall health benefits. We discussed the following Behavioral Modification Strategies today: increasing  lean protein intake and decreasing simple carbohydrates, no skipping meals, planning for success, and keep a strict food journal.  Brenda Russo has agreed to follow-up with our clinic in 3 weeks. She was informed of the importance of frequent follow-up visits to maximize her success with intensive lifestyle modifications for her multiple health conditions.  ALLERGIES: Allergies  Allergen Reactions  . Eggs Or Egg-Derived Products     Shortness of breath, wheezing    MEDICATIONS: Current Outpatient Medications on File Prior to Visit  Medication Sig Dispense Refill  . albuterol (VENTOLIN HFA) 108 (90 Base) MCG/ACT inhaler Inhale 1-2 puffs into the lungs every 4 (four) hours as needed for wheezing or shortness of breath. 18 g 5  . amLODipine (NORVASC) 5 MG tablet Take 1 tablet (5 mg total) by mouth daily. 90 tablet 3  . Ascorbic Acid (VITAMIN C) 1000 MG tablet Take 1,000 mg by mouth daily.    Marland Kitchen azelastine (ASTELIN) 0.1 % nasal spray Place 2 sprays into both nostrils at bedtime as needed for rhinitis. Use in each nostril as directed 30 mL 3  . benzonatate (TESSALON) 200 MG capsule Take 1 capsule (200 mg total) by mouth 3 (three) times daily as needed for cough. 40 capsule 0  . Coenzyme Q10 (COQ10) 100 MG CAPS Take 1 capsule by mouth daily.    Marland Kitchen doxycycline (VIBRA-TABS) 100 MG tablet Take 1 tablet (100 mg total) by mouth 2 (two) times daily. 15 tablet 0  . Ergocalciferol 2500 units CAPS Take 5,000 Units by mouth daily. 30 capsule 0  . fluticasone (FLONASE) 50 MCG/ACT nasal spray Place 2 sprays into both nostrils daily. 16 g 5  . fluticasone furoate-vilanterol (BREO ELLIPTA) 200-25 MCG/INH AEPB Inhale 1 puff into the lungs daily. 180 each 3  . losartan-hydrochlorothiazide (HYZAAR) 100-12.5 MG tablet Take 1 tablet by mouth daily. 90 tablet 1  . magnesium oxide (MAG-OX) 400 MG tablet 2 tablets daily. 400-800 mg daily    . meloxicam (MOBIC) 7.5 MG tablet Take 1 tablet (7.5 mg total) by mouth daily. 30  tablet 0  . Multiple Minerals-Vitamins (CALCIUM CITRATE PLUS/MAGNESIUM) TABS Take 1 tablet by mouth.    . Multiple Vitamins-Minerals (COMPLETE WOMENS PO) Take 1 capsule by mouth 2 (two) times daily.      . Omega-3 Fatty Acids (FISH OIL) 1000 MG CAPS Take 1 capsule by mouth.    . predniSONE (DELTASONE) 10 MG tablet 3 tabs x 3 days, 2 tabs x 3 days, 1 tab x 3 days 18 tablet 0  . topiramate (TOPAMAX) 50 MG tablet Take 1 tablet (50 mg total) by mouth daily. 30 tablet 0   No current facility-administered medications on file prior to visit.     PAST MEDICAL HISTORY: Past Medical History:  Diagnosis Date  . Allergic rhinitis   . Anemia   . Anxiety   . Asthma   . HTN (hypertension)   . Hyperlipidemia  borderline  . Obese   . OSA (obstructive sleep apnea)    mild ,no CPAP  . Overweight(278.02)   . Plantar wart    history  . Prediabetes   . Shortness of breath     PAST SURGICAL HISTORY: Past Surgical History:  Procedure Laterality Date  . BREAST BIOPSY Right 09/02/2015  . CESAREAN SECTION    . TUBAL LIGATION      SOCIAL HISTORY: Social History   Tobacco Use  . Smoking status: Never Smoker  . Smokeless tobacco: Never Used  Substance Use Topics  . Alcohol use: No  . Drug use: No    FAMILY HISTORY: Family History  Problem Relation Age of Onset  . Heart disease Mother        pacemaker   . Hypertension Mother   . Hyperlipidemia Mother   . Obesity Mother   . Asthma Father   . Pancreatic cancer Brother   . Breast cancer Maternal Aunt   . Colon cancer Neg Hx    ROS: Review of Systems  Constitutional: Negative for weight loss.  Psychiatric/Behavioral: Positive for depression (emotional eating).   PHYSICAL EXAM: Blood pressure 121/72, pulse 68, temperature 97.8 F (36.6 C), temperature source Oral, height 5\' 5"  (1.651 m), weight 197 lb (89.4 kg), SpO2 98 %. Body mass index is 32.78 kg/m. Physical Exam Vitals signs reviewed.  Constitutional:      Appearance:  Normal appearance. She is obese.  Cardiovascular:     Rate and Rhythm: Normal rate.     Pulses: Normal pulses.  Pulmonary:     Effort: Pulmonary effort is normal.     Breath sounds: Normal breath sounds.  Musculoskeletal: Normal range of motion.  Skin:    General: Skin is warm and dry.  Neurological:     Mental Status: She is alert and oriented to person, place, and time.  Psychiatric:        Behavior: Behavior normal.   RECENT LABS AND TESTS: BMET    Component Value Date/Time   NA 142 03/27/2018 1232   K 4.2 03/27/2018 1232   CL 106 03/27/2018 1232   CO2 21 03/27/2018 1232   GLUCOSE 95 03/27/2018 1232   GLUCOSE 100 (H) 06/03/2015 0747   BUN 16 03/27/2018 1232   CREATININE 0.80 03/27/2018 1232   CALCIUM 9.2 03/27/2018 1232   GFRNONAA 80 03/27/2018 1232   GFRAA 92 03/27/2018 1232   Lab Results  Component Value Date   HGBA1C 5.6 03/27/2018   HGBA1C 5.5 10/10/2017   HGBA1C 5.6 05/09/2017   HGBA1C 5.6 12/06/2016   HGBA1C 5.6 05/17/2016   Lab Results  Component Value Date   INSULIN 7.8 03/27/2018   INSULIN 6.1 10/10/2017   INSULIN 6.1 05/09/2017   INSULIN 11.2 12/06/2016   INSULIN 9.8 05/17/2016   CBC    Component Value Date/Time   WBC 6.3 05/09/2017 0917   WBC 6.5 11/13/2012 1003   RBC 4.73 05/09/2017 0917   RBC 5.03 11/13/2012 1003   HGB 14.0 05/09/2017 0917   HCT 42.8 05/09/2017 0917   PLT 315 05/09/2017 0917   MCV 91 05/09/2017 0917   MCH 29.6 05/09/2017 0917   MCH 26.9 (A) 05/20/2012 1404   MCHC 32.7 05/09/2017 0917   MCHC 33.5 11/13/2012 1003   RDW 13.3 05/09/2017 0917   LYMPHSABS 1.8 05/09/2017 0917   MONOABS 0.4 11/13/2012 1003   EOSABS 0.1 05/09/2017 0917   BASOSABS 0.0 05/09/2017 0917   Iron/TIBC/Ferritin/ %Sat    Component Value  Date/Time   IRON 88 05/20/2012 1422   TIBC 303 05/20/2012 1422   FERRITIN 80 05/20/2012 1422   IRONPCTSAT 29 05/20/2012 1422   Lipid Panel     Component Value Date/Time   CHOL 190 03/27/2018 1232   TRIG 45  03/27/2018 1232   HDL 66 03/27/2018 1232   CHOLHDL 2.7 05/09/2017 0917   CHOLHDL 3 06/03/2015 0747   VLDL 9.0 06/03/2015 0747   LDLCALC 115 (H) 03/27/2018 1232   Hepatic Function Panel     Component Value Date/Time   PROT 7.3 03/27/2018 1232   ALBUMIN 3.9 03/27/2018 1232   AST 17 03/27/2018 1232   ALT 12 03/27/2018 1232   ALKPHOS 89 03/27/2018 1232   BILITOT 0.7 03/27/2018 1232   BILIDIR 0.1 11/13/2012 1003      Component Value Date/Time   TSH 2.460 05/09/2017 0917   TSH 1.770 05/17/2016 0949   TSH 1.23 11/13/2012 1003   Results for ANWYN, KRIEGEL "[name]" (MRN [id number]) as of 04/18/2018 16:28  Ref. Range 03/27/2018 12:32  Vitamin D, 25-Hydroxy Latest Ref Range: 30.0 - 100.0 ng/mL 44.3   OBESITY BEHAVIORAL INTERVENTION VISIT  Today's visit was #38  Starting weight: 200 lbs Starting date: 05/17/2016 Today's weight: 197 lbs Today's date: 04/18/2018 Total lbs lost to date: 3    04/18/2018  Height 5\' 5"  (1.651 m)  Weight 197 lb (89.4 kg)  BMI (Calculated) 32.78  BLOOD PRESSURE - SYSTOLIC 409  BLOOD PRESSURE - DIASTOLIC 72   Body Fat % 81.1 %  Total Body Water (lbs) 79.4 lbs   ASK: We discussed the diagnosis of obesity with [name] today and Renesmay agreed to give [name] permission to discuss obesity behavioral modification therapy today.  ASSESS: Valencia has the diagnosis of obesity and her BMI today is 32.78. Chevella is in the action stage of change.   ADVISE: Carrigan was educated on the multiple health risks of obesity as well as the benefit of weight loss to improve her health. She was advised of the need for long term treatment and the importance of lifestyle modifications to improve her current health and to decrease her risk of future health problems.  AGREE: Multiple dietary modification options and treatment options were discussed and  Shynice agreed to follow the recommendations documented in the above note.  ARRANGE: Marianna was educated  on the importance of frequent visits to treat obesity as outlined per CMS and USPSTF guidelines and agreed to schedule her next follow up appointment today.  IMichaelene Song, am acting as Location manager for [employer], FNP-C.  I have reviewed the above documentation for accuracy and completeness, and I agree with the above.  -  , FNP-C.

## 2018-05-02 ENCOUNTER — Encounter (INDEPENDENT_AMBULATORY_CARE_PROVIDER_SITE_OTHER): Payer: Self-pay

## 2018-05-04 ENCOUNTER — Encounter: Payer: Self-pay | Admitting: Internal Medicine

## 2018-05-04 DIAGNOSIS — Z01419 Encounter for gynecological examination (general) (routine) without abnormal findings: Secondary | ICD-10-CM | POA: Diagnosis not present

## 2018-05-04 DIAGNOSIS — Z1151 Encounter for screening for human papillomavirus (HPV): Secondary | ICD-10-CM | POA: Diagnosis not present

## 2018-05-04 DIAGNOSIS — Z6833 Body mass index (BMI) 33.0-33.9, adult: Secondary | ICD-10-CM | POA: Diagnosis not present

## 2018-05-04 LAB — HM PAP SMEAR

## 2018-05-09 ENCOUNTER — Ambulatory Visit (INDEPENDENT_AMBULATORY_CARE_PROVIDER_SITE_OTHER): Payer: 59 | Admitting: Family Medicine

## 2018-06-06 ENCOUNTER — Encounter: Payer: Self-pay | Admitting: Internal Medicine

## 2018-06-19 ENCOUNTER — Encounter: Payer: Self-pay | Admitting: Internal Medicine

## 2018-07-10 ENCOUNTER — Other Ambulatory Visit: Payer: Self-pay | Admitting: Internal Medicine

## 2018-07-10 ENCOUNTER — Other Ambulatory Visit (INDEPENDENT_AMBULATORY_CARE_PROVIDER_SITE_OTHER): Payer: Self-pay | Admitting: Family Medicine

## 2018-07-10 DIAGNOSIS — F3289 Other specified depressive episodes: Secondary | ICD-10-CM

## 2018-07-10 MED FILL — ALBUTEROL SULFATE HFA 108 (: 108 (90 BAS | 50 days supply | Qty: 54 | Fill #1

## 2018-07-10 MED FILL — AZELASTINE HCL 137 MCG SPRY: 0.1 | 90 days supply | Qty: 90 | Fill #1

## 2018-07-10 MED FILL — FLUTICASONE PROP 50 MCG SPR: 50 | 90 days supply | Qty: 48 | Fill #1

## 2018-07-10 MED FILL — LOSARTAN POTASSIUM 100 MG T: 100 | 90 days supply | Qty: 90 | Fill #1

## 2018-07-10 MED FILL — AMLODIPINE BESYLATE 5 MG TA: 5 | 90 days supply | Qty: 90 | Fill #0

## 2018-07-10 MED FILL — HYDROCHLOROTHIAZIDE 12.5 MG: 12.5 | 90 days supply | Qty: 90 | Fill #1

## 2018-07-10 MED FILL — BREO ELLIPTA 200-25 MCG INH: 200-25 | 90 days supply | Qty: 180 | Fill #0

## 2018-07-27 ENCOUNTER — Encounter: Payer: Self-pay | Admitting: Gastroenterology

## 2018-08-06 ENCOUNTER — Ambulatory Visit (INDEPENDENT_AMBULATORY_CARE_PROVIDER_SITE_OTHER): Payer: 59 | Admitting: Internal Medicine

## 2018-08-06 ENCOUNTER — Other Ambulatory Visit: Payer: Self-pay

## 2018-08-06 ENCOUNTER — Encounter: Payer: Self-pay | Admitting: Internal Medicine

## 2018-08-06 VITALS — BP 149/80 | HR 63 | Temp 97.9°F | Resp 16 | Ht 65.0 in | Wt 204.4 lb

## 2018-08-06 DIAGNOSIS — Z0000 Encounter for general adult medical examination without abnormal findings: Secondary | ICD-10-CM

## 2018-08-06 NOTE — Patient Instructions (Signed)
  GO TO THE FRONT DESK Schedule your next appointment for a physical in 1 year     Check the  blood pressure 2 or 3 times a month   Be sure your blood pressure is between 110/65 and  135/85. If it is consistently higher or lower, let me know

## 2018-08-06 NOTE — Assessment & Plan Note (Addendum)
--  Tdap ,pnm shot, shingrex: declined. She does get flu shots @ work    --Female care: per saw Dr. Garwin Brothers this year, reports she had a PAP. Last MMG 09/2017 --[facility]--08/19/08- normal, Dr Ardis Hughs, got a letter , encouraged to call GI --dexa 07-2015 WNL  --diet and exercise: Recommend to continue with the wellness center, we did have a conversation about diet moderation, healthy choices. --labs reviewed.  All are satisfactory

## 2018-08-06 NOTE — Progress Notes (Signed)
Pre visit review using our clinic review tool, if applicable. No additional management support is needed unless otherwise documented below in the visit note. 

## 2018-08-06 NOTE — Progress Notes (Signed)
Subjective:    Patient ID: Brenda Russo, female    DOB: 1955/05/30, 63 y.o.   MRN: [id number]  DOS:  08/06/2018 Type of visit - description: Complete physical exam In general doing well, no major concerns. Still working, following all the precautions  Review of Systems Very rarely have dizziness otherwise feeling great.  Asthma under excellent control  Other than above, a 14 point review of systems is negative     Past Medical History:  Diagnosis Date  . Allergic rhinitis   . Anemia   . Anxiety   . Asthma   . HTN (hypertension)   . Hyperlipidemia    borderline  . Obese   . OSA (obstructive sleep apnea)    mild ,no CPAP  . Overweight(278.02)   . Plantar wart    history  . Prediabetes   . Shortness of breath     Past Surgical History:  Procedure Laterality Date  . BREAST BIOPSY Right 09/02/2015  . CESAREAN SECTION    . TUBAL LIGATION      Social History   Socioeconomic History  . Marital status: Married    Spouse name: Not on file  . Number of children: 3  . Years of education: Not on file  . Highest education level: Not on file  Occupational History  . 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 Financial resource strain: Not on file  . Food insecurity    Worry: Not on file    Inability: Not on file  . Transportation needs    Medical: Not on file    Non-medical: Not on file  Tobacco Use  . Smoking status: Never Smoker  . Smokeless tobacco: Never Used  Substance and Sexual Activity  . Alcohol use: No  . Drug use: No  . Sexual activity: Not Currently  Lifestyle  . Physical activity    Days per week: Not on file    Minutes per session: Not on file  . Stress: Not on file  Relationships  . Social Herbalist on phone: Not on file    Gets together: Not on file    Attends religious service: Not on file    Active member of club or organization: Not on file    Attends  meetings of clubs or organizations: Not on file    Relationship status: Not on file  . Intimate partner violence    Fear of current or ex partner: Not on file    Emotionally abused: Not on file    Physically abused: Not on file    Forced sexual activity: Not on file  Other Topics Concern  . Not on file  Social History Narrative   3 biological children   1 adopted child   3 step-children   Household-- pt , husband and a son     Family History  Problem Relation Age of Onset  . Heart disease Mother        pacemaker   . Hypertension Mother   . Hyperlipidemia Mother   . Obesity Mother   . Asthma Father   . Pancreatic cancer Brother   . Breast cancer Maternal Aunt   . Colon cancer Neg Hx      Allergies as of 08/06/2018      Reactions   Eggs Or Egg-derived Products    Shortness of breath, wheezing  Medication List       Accurate as of August 06, 2018 11:59 PM. If you have any questions, ask your nurse or doctor.        STOP taking these medications   azelastine 0.1 % nasal spray Commonly known as: ASTELIN Stopped by: Kathlene November, MD   benzonatate 200 MG capsule Commonly known as: TESSALON Stopped by: Kathlene November, MD   doxycycline 100 MG tablet Commonly known as: VIBRA-TABS Stopped by: Kathlene November, MD   meloxicam 7.5 MG tablet Commonly known as: MOBIC Stopped by: Kathlene November, MD   predniSONE 10 MG tablet Commonly known as: DELTASONE Stopped by: Kathlene November, MD   vitamin C 1000 MG tablet Stopped by: Kathlene November, MD     TAKE these medications   albuterol 108 (90 Base) MCG/ACT inhaler Commonly known as: Ventolin HFA Inhale 1-2 puffs into the lungs every 4 (four) hours as needed for wheezing or shortness of breath.   amLODipine 5 MG tablet Commonly known as: NORVASC TAKE 1 TABLET BY MOUTH ONCE DAILY   Breo Ellipta 200-25 MCG/INH Aepb Generic drug: fluticasone furoate-vilanterol INHALE 1 PUFF INTO THE LUNGS DAILY.   Calcium Citrate Plus/Magnesium Tabs Take 1 tablet  by mouth.   COMPLETE WOMENS PO Take 1 capsule by mouth 2 (two) times daily.   CoQ10 100 MG Caps Take 1 capsule by mouth daily.   Ergocalciferol 62.5 MCG (2500 UT) Caps Take 5,000 Units by mouth daily.   Fish Oil 1000 MG Caps Take 1 capsule by mouth.   fluticasone 50 MCG/ACT nasal spray Commonly known as: FLONASE Place 2 sprays into both nostrils daily.   losartan-hydrochlorothiazide 100-12.5 MG tablet Commonly known as: HYZAAR Take 1 tablet by mouth daily.   magnesium oxide 400 MG tablet Commonly known as: MAG-OX 2 tablets daily. 400-800 mg daily           Objective:   Physical Exam BP (!) 149/80 (BP Location: Left Arm, Patient Position: Sitting, Cuff Size: Small)   Pulse 63   Temp 97.9 F (36.6 C) (Oral)   Resp 16   Ht 5\' 5"  (1.651 m)   Wt 204 lb 6 oz (92.7 kg)   SpO2 100%   BMI 34.01 kg/m   General: Well developed, NAD, BMI noted Neck: No  thyromegaly  HEENT:  Normocephalic . Face symmetric, atraumatic Lungs:  CTA B Normal respiratory effort, no intercostal retractions, no accessory muscle use. Heart: RRR,  no murmur.  No pretibial edema bilaterally  Abdomen:  Not distended, soft, non-tender. No rebound or rigidity.   Skin: Exposed areas without rash. Not pale. Not jaundice Neurologic:  alert & oriented X3.  Speech normal, gait appropriate for age and unassisted Strength symmetric and appropriate for age.  Psych: Cognition and judgment appear intact.  Cooperative with normal attention span and concentration.  Behavior appropriate. No anxious or depressed appearing.     Assessment     Assessment Prediabetes A1c 6.0 (05/2014) HTN Sinus bradycardia-PVCs: Saw cardiology 04/26/2017. She was asx RTC prn Hyperlipidemia Anxiety Obesity Asthma - h/o intubation x2 in the 90s  OSA, no CPAP BTL LLQ abd pain on-off , last GI visit 11-2015, CT abd (-)  PLAN: Prediabetes, HTN, hyperlipidemia, anxiety, asthma: All seem well controlled on current  medications. Recent labs reviewed and they are satisfactory. Hardly ever uses her rescue inhaler RTC 1 year

## 2018-08-07 NOTE — Assessment & Plan Note (Signed)
Prediabetes, HTN, hyperlipidemia, anxiety, asthma: All seem well controlled on current medications. Recent labs reviewed and they are satisfactory. Hardly ever uses her rescue inhaler RTC 1 year

## 2018-09-12 ENCOUNTER — Other Ambulatory Visit: Payer: Self-pay

## 2018-09-12 ENCOUNTER — Encounter: Payer: Self-pay | Admitting: Pulmonary Disease

## 2018-09-12 ENCOUNTER — Ambulatory Visit: Payer: 59 | Admitting: Pulmonary Disease

## 2018-09-12 VITALS — BP 126/80 | HR 66 | Temp 97.8°F | Ht 66.0 in | Wt 208.0 lb

## 2018-09-12 DIAGNOSIS — Z8669 Personal history of other diseases of the nervous system and sense organs: Secondary | ICD-10-CM

## 2018-09-12 DIAGNOSIS — J4530 Mild persistent asthma, uncomplicated: Secondary | ICD-10-CM | POA: Diagnosis not present

## 2018-09-12 MED ORDER — ARNUITY ELLIPTA 200 MCG/ACT IN AEPB: 1.0000 | INHALATION_SPRAY | Freq: Every day | RESPIRATORY_TRACT | 5 refills | Status: DC

## 2018-09-12 MED FILL — ARNUITY ELLIPTA 200 MCG INH: 200 | 30 days supply | Qty: 30 | Fill #0

## 2018-09-12 NOTE — Patient Instructions (Signed)
Arnuity 1 puff daily, and rinse your mouth after each use  Don't use Breo while using arnuity  Follow up in 6 months

## 2018-09-12 NOTE — Progress Notes (Signed)
   Subjective:    Patient ID: Brenda Russo, female    DOB: [birth year], 63 y.o.   MRN: [id number]  HPI    Review of Systems  Constitutional: Negative for fever and unexpected weight change.  HENT: Negative for congestion, dental problem, ear pain, nosebleeds, postnasal drip, rhinorrhea, sinus pressure, sneezing, sore throat and trouble swallowing.   Eyes: Negative for redness and itching.  Respiratory: Negative for cough, chest tightness, shortness of breath and wheezing.   Cardiovascular: Negative for palpitations and leg swelling.  Gastrointestinal: Negative for nausea and vomiting.  Genitourinary: Negative for dysuria.  Musculoskeletal: Negative for joint swelling.  Skin: Negative for rash.  Allergic/Immunologic: Positive for environmental allergies and food allergies. Negative for immunocompromised state.  Neurological: Negative for headaches.  Hematological: Does not bruise/bleed easily.  Psychiatric/Behavioral: Negative for dysphoric mood. The patient is not nervous/anxious.        Objective:   Physical Exam        Assessment & Plan:

## 2018-09-12 NOTE — Progress Notes (Signed)
[facility]  Chief Complaint  Patient presents with  . Consult    former Dr Lenna Gilford, reestablish with new pulmonary provider for asthma    Constitutional:  BP 126/80 (BP Location: Right Arm, Cuff Size: Normal)   Pulse 66   Temp 97.8 F (36.6 C) (Oral)   Ht 5\' 6"  (1.676 m)   Wt 208 lb (94.3 kg)   SpO2 99%   BMI 33.57 kg/m   Past Medical History:  HLD, HTN, Anxiety, Anemia.  Brief Summary:  Brenda Russo is a 63 y.o. female with allergic asthma and obstructive sleep apnea.  She was previously seen by Dr. Lenna Gilford.    She had sleep study in 2014.  Showed mild sleep apnea.  Opted for weight loss.  Weight was done, then up, and more recently back down.  She snores and grinds her teeth at night.  She otherwise doesn't feel like she has a sleep problem.  She uses Breo daily.  Hasn't used ventolin in months.  Not having cough, wheeze, sputum, or chest pain.  Was having trouble with eczema, but not recently.  Has eye allergy symptoms in Fall and Spring.  Has allergy to eggs >> makes her short of breath.  No other food or medication allergies.  CXR 03/17/18 (reviewed by me) - no ASD.  Works at [employer].  Physical Exam:   Appearance - well kempt   ENMT - clear nasal mucosa, midline nasal  septum, no oral exudates, no LAN, trachea midline  Respiratory - normal chest wall, normal respiratory effort, no accessory muscle use, no wheeze/rales  CV - s1s2 regular rate and rhythm, no murmurs, no peripheral edema, radial pulses symmetric  GI - soft, non tender, no masses  Lymph - no adenopathy noted in neck and axillary areas  MSK - normal gait  Ext - no cyanosis, clubbing, or joint inflammation noted  Skin - no rashes, lesions, or ulcers  Neuro - normal strength, oriented x 3  Psych - normal mood and affect   Assessment/Plan:   Allergic asthma. - will try changing from breo to arnuity 200 one puff daily - demonstrated  inhaler technique - prn ventolin >> don't think she needs a spacer device at this time - discussed environmental control  History of obstructive sleep apnea and bruxism. - reviewed symptoms to monitor for that could indicate her sleep apnea has progressed - discussed importance of weight management   Patient Instructions  Arnuity 1 puff daily, and rinse your mouth after each use  Don't use Breo while using arnuity  Follow up in 6 months    Chesley Mires, MD [facility] Pager: [phone] 09/12/2018, 11:29 AM  Flow Sheet     Pulmonary tests:  Spirometry 12/24/15 >> 1.6 (76%), FEV1% 61  Sleep tests:  PSG 08/09/12 >> 8.5, SpO2 low 84%  Medications:   Allergies as of 09/12/2018      Reactions   Eggs Or Egg-derived Products    Shortness of breath, wheezing      Medication List       Accurate as of September 12, 2018 11:29 AM. If you have any questions, ask your nurse or doctor.        STOP taking these medications   Breo Ellipta 200-25 MCG/INH Aepb Generic drug: fluticasone furoate-vilanterol Stopped by: Chesley Mires, MD     TAKE these medications   albuterol 108 (90 Base) MCG/ACT inhaler Commonly known as: Ventolin HFA Inhale 1-2  puffs into the lungs every 4 (four) hours as needed for wheezing or shortness of breath.   amLODipine 5 MG tablet Commonly known as: NORVASC TAKE 1 TABLET BY MOUTH ONCE DAILY   Arnuity Ellipta 200 MCG/ACT Aepb Generic drug: Fluticasone Furoate Inhale 1 puff into the lungs daily. Started by: Chesley Mires, MD   Calcium Citrate Plus/Magnesium Tabs Take 1 tablet by mouth.   COMPLETE WOMENS PO Take 1 capsule by mouth 2 (two) times daily.   CoQ10 100 MG Caps Take 1 capsule by mouth daily.   Ergocalciferol 62.5 MCG (2500 UT) Caps Take 5,000 Units by mouth daily.   Fish Oil 1000 MG Caps Take 1 capsule by mouth.   fluticasone 50 MCG/ACT nasal spray Commonly known as: FLONASE Place 2 sprays into both nostrils  daily.   losartan-hydrochlorothiazide 100-12.5 MG tablet Commonly known as: HYZAAR Take 1 tablet by mouth daily.   magnesium oxide 400 MG tablet Commonly known as: MAG-OX 2 tablets daily. 400-800 mg daily       Past Surgical History:  She  has a past surgical history that includes Cesarean section; Tubal ligation; and Breast biopsy (Right, 09/02/2015).  Family History:  Her family history includes Asthma in her father; Breast cancer in her maternal aunt; Heart disease in her mother; Hyperlipidemia in her mother; Hypertension in her mother; Obesity in her mother; Pancreatic cancer in her brother.  Social History:  She  reports that she has never smoked. She has never used smokeless tobacco. She reports that she does not drink alcohol or use drugs.

## 2018-10-03 ENCOUNTER — Encounter: Payer: Self-pay | Admitting: Gastroenterology

## 2018-10-10 ENCOUNTER — Encounter: Payer: Self-pay | Admitting: Internal Medicine

## 2018-10-11 ENCOUNTER — Ambulatory Visit (INDEPENDENT_AMBULATORY_CARE_PROVIDER_SITE_OTHER): Payer: 59 | Admitting: Internal Medicine

## 2018-10-11 ENCOUNTER — Other Ambulatory Visit: Payer: Self-pay

## 2018-10-11 ENCOUNTER — Encounter: Payer: Self-pay | Admitting: Internal Medicine

## 2018-10-11 VITALS — BP 114/45 | HR 67 | Temp 96.0°F | Resp 16 | Ht 66.0 in | Wt 214.2 lb

## 2018-10-11 DIAGNOSIS — R001 Bradycardia, unspecified: Secondary | ICD-10-CM | POA: Diagnosis not present

## 2018-10-11 DIAGNOSIS — I10 Essential (primary) hypertension: Secondary | ICD-10-CM

## 2018-10-11 NOTE — Progress Notes (Signed)
Pre visit review using our clinic review tool, if applicable. No additional management support is needed unless otherwise documented below in the visit note. 

## 2018-10-11 NOTE — Patient Instructions (Signed)
   Check the  blood pressure 2 or 3 times a  week along with your heart rate BP GOAL is between 110/65 and  135/85. It heart rate should be above 50 Call me if your blood pressure and heart rate are abnormal

## 2018-10-11 NOTE — Progress Notes (Signed)
Subjective:    Patient ID: Brenda Russo, female    DOB: [birth year], 63 y.o.   MRN: [id number]  DOS:  10/11/2018 Type of visit - description: Acute Here at my request:  2 days ago she reported bradycardia and a slightly elevated BP.  See message. Pulse was in the 30s. After that, she continue checking, yesterday pulse was in the 60s. 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  Review of Systems Today she reports no chest pain no difficulty breathing. No dizziness, weakness, clamminess.   Past Medical History:  Diagnosis Date  . Allergic rhinitis   . Anemia   . Anxiety   . Asthma   . HTN (hypertension)   . Hyperlipidemia    borderline  . Obese   . OSA (obstructive sleep apnea)    mild ,no CPAP  . Overweight(278.02)   . Plantar wart    history  . Prediabetes   . Shortness of breath     Past Surgical History:  Procedure Laterality Date  . BREAST BIOPSY Right 09/02/2015  . CESAREAN SECTION    . TUBAL LIGATION      Social History   Socioeconomic History  . Marital status: Married    Spouse name: Not on file  . Number of children: 3  . Years of education: Not on file  . Highest education level: Not on file  Occupational History  . 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 Financial resource strain: Not on file  . Food insecurity    Worry: Not on file    Inability: Not on file  . Transportation needs    Medical: Not on file    Non-medical: Not on file  Tobacco Use  . Smoking status: Never Smoker  . Smokeless tobacco: Never Used  Substance and Sexual Activity  . Alcohol use: No  . Drug use: No  . Sexual activity: Not Currently  Lifestyle  . Physical activity    Days per week: Not on file    Minutes per session: Not on file  . Stress: Not on file  Relationships  . Social Herbalist on phone: Not on file    Gets together: Not on  file    Attends religious service: Not on file    Active member of club or organization: Not on file    Attends meetings of clubs or organizations: Not on file    Relationship status: Not on file  . Intimate partner violence    Fear of current or ex partner: Not on file    Emotionally abused: Not on file    Physically abused: Not on file    Forced sexual activity: Not on file  Other Topics Concern  . Not on file  Social History Narrative   3 biological children   1 adopted child   3 step-children   Household-- pt , husband and a son      Allergies as of 10/11/2018      Reactions   Eggs Or Egg-derived Products    Shortness of breath, wheezing      Medication List       Accurate as of October 11, 2018  3:35 PM. If you have any questions, ask your nurse or doctor.        albuterol 108 (90 Base) MCG/ACT inhaler Commonly known  as: Ventolin HFA Inhale 1-2 puffs into the lungs every 4 (four) hours as needed for wheezing or shortness of breath.   amLODipine 5 MG tablet Commonly known as: NORVASC TAKE 1 TABLET BY MOUTH ONCE DAILY   Arnuity Ellipta 200 MCG/ACT Aepb Generic drug: Fluticasone Furoate Inhale 1 puff into the lungs daily.   Calcium Citrate Plus/Magnesium Tabs Take 1 tablet by mouth.   COMPLETE WOMENS PO Take 1 capsule by mouth 2 (two) times daily.   CoQ10 100 MG Caps Take 1 capsule by mouth daily.   Ergocalciferol 62.5 MCG (2500 UT) Caps Take 5,000 Units by mouth daily.   Fish Oil 1000 MG Caps Take 1 capsule by mouth.   fluticasone 50 MCG/ACT nasal spray Commonly known as: FLONASE Place 2 sprays into both nostrils daily.   losartan-hydrochlorothiazide 100-12.5 MG tablet Commonly known as: HYZAAR Take 1 tablet by mouth daily.   magnesium oxide 400 MG tablet Commonly known as: MAG-OX 2 tablets daily. 400-800 mg daily           Objective:   Physical Exam BP (!) 114/45 (BP Location: Left Arm, Patient Position: Sitting, Cuff Size: Small)    Pulse 67   Temp (!) 96 F (35.6 C) (Temporal)   Resp 16   Ht 5\' 6"  (1.676 m)   Wt 214 lb 4 oz (97.2 kg)   SpO2 98%   BMI 34.58 kg/m  General:   Well developed, NAD, BMI noted. HEENT:  Normocephalic . Face symmetric, atraumatic Lungs:  CTA B Normal respiratory effort, no intercostal retractions, no accessory muscle use. Heart: RRR,  no murmur.  No pretibial edema bilaterally Radial pulses symmetric. Skin: Not pale. Not jaundice Neurologic:  alert & oriented X3.  Speech normal, gait appropriate for age and unassisted Psych--  Cognition and judgment appear intact.  Cooperative with normal attention span and concentration.  Behavior appropriate. No anxious or depressed appearing.      Assessment    Assessment Prediabetes A1c 6.0 (05/2014) HTN Sinus bradycardia-PVCs: Saw cardiology 04/26/2017. She was asx RTC prn Hyperlipidemia Anxiety Obesity Asthma - h/o intubation x2 in the 90s  OSA, no CPAP BTL LLQ abd pain on-off , last GI visit 11-2015, CT abd (-)  PLAN: Bradycardia: Noted 2 days ago, pulse in the 30s.  She was asymptomatic. Today her pulse is in the 60s. Back in 2018, she was seen by Dr. Caryl Comes, cardiology.  He noted the bradycardia was chronic and asx, had  PVCs but they were relatively infrequent (but could decrease her heart rate artifactually). EKG today: NSR, no acute changes. Plan: Observation, if recurrent bradycardia is observed, consider Holter monitor to rule out true bradycardia versus frequent PVCs. HTN BP was checked manually: Right: 150/85, left: 145/83. Well-controlled in the ambulatory setting, continue monitoring, continue Hyzaar.

## 2018-10-12 NOTE — Assessment & Plan Note (Signed)
Bradycardia: Noted 2 days ago, pulse in the 30s.  She was asymptomatic. Today her pulse is in the 60s. Back in 2018, she was seen by Dr. Caryl Comes, cardiology.  He noted the bradycardia was chronic and asx, had  PVCs but they were relatively infrequent (but could decrease her heart rate artifactually). EKG today: NSR, no acute changes. Plan: Observation, if recurrent bradycardia is observed, consider Holter monitor to rule out true bradycardia versus frequent PVCs. HTN BP was checked manually: Right: 150/85, left: 145/83. Well-controlled in the ambulatory setting, continue monitoring, continue Hyzaar.

## 2018-10-22 ENCOUNTER — Ambulatory Visit (AMBULATORY_SURGERY_CENTER): Payer: Self-pay

## 2018-10-22 ENCOUNTER — Other Ambulatory Visit: Payer: Self-pay

## 2018-10-22 VITALS — Ht 65.0 in | Wt 214.4 lb

## 2018-10-22 DIAGNOSIS — Z1211 Encounter for screening for malignant neoplasm of colon: Secondary | ICD-10-CM

## 2018-10-22 MED ORDER — PEG 3350-KCL-NA BICARB-NACL 420 G PO SOLR: 4000.0000 mL | Freq: Once | ORAL | 0 refills | Status: AC

## 2018-10-22 MED FILL — PEG-3350 SOLUTION: 420 | 1 days supply | Qty: 4000 | Fill #0

## 2018-10-22 NOTE — Progress Notes (Signed)
Denies allergies to eggs or soy products. Denies complication of anesthesia or sedation. Denies use of weight loss medication. Denies use of O2.   Emmi instructions given for colonoscopy.  

## 2018-10-24 ENCOUNTER — Encounter: Payer: Self-pay | Admitting: Gastroenterology

## 2018-10-25 ENCOUNTER — Other Ambulatory Visit: Payer: Self-pay | Admitting: Internal Medicine

## 2018-10-25 MED FILL — HYDROCHLOROTHIAZIDE 12.5 MG: 12.5 | 90 days supply | Qty: 90 | Fill #0

## 2018-10-25 MED FILL — AMLODIPINE BESYLATE 5 MG TA: 5 | 90 days supply | Qty: 90 | Fill #1

## 2018-10-25 MED FILL — BREO ELLIPTA 200-25 MCG INH: 200-25 | 90 days supply | Qty: 180 | Fill #1

## 2018-10-25 MED FILL — LOSARTAN POTASSIUM 100 MG T: 100 | 90 days supply | Qty: 90 | Fill #0

## 2018-11-02 ENCOUNTER — Telehealth: Payer: Self-pay | Admitting: Gastroenterology

## 2018-11-02 NOTE — Telephone Encounter (Signed)

## 2018-11-05 ENCOUNTER — Other Ambulatory Visit: Payer: Self-pay | Admitting: Gastroenterology

## 2018-11-05 ENCOUNTER — Encounter: Payer: Self-pay | Admitting: Gastroenterology

## 2018-11-05 ENCOUNTER — Ambulatory Visit (AMBULATORY_SURGERY_CENTER): Payer: 59 | Admitting: Gastroenterology

## 2018-11-05 ENCOUNTER — Other Ambulatory Visit: Payer: Self-pay

## 2018-11-05 VITALS — BP 140/70 | HR 59 | Temp 98.2°F | Resp 14 | Ht 65.0 in | Wt 214.0 lb

## 2018-11-05 DIAGNOSIS — Z1211 Encounter for screening for malignant neoplasm of colon: Secondary | ICD-10-CM | POA: Diagnosis not present

## 2018-11-05 DIAGNOSIS — D122 Benign neoplasm of ascending colon: Secondary | ICD-10-CM | POA: Diagnosis not present

## 2018-11-05 HISTORY — PX: COLONOSCOPY: SHX174

## 2018-11-05 MED ORDER — SODIUM CHLORIDE 0.9 % IV SOLN: 500.0000 mL | Freq: Once | INTRAVENOUS | Status: DC

## 2018-11-05 NOTE — Patient Instructions (Signed)
Read all of the handouts given to you by your recovery room nurse.  Thank-you for choosing us for your healthcare needs today.  YOU HAD AN ENDOSCOPIC PROCEDURE TODAY AT THE [facility]:   Refer to the procedure report that was given to you for any specific questions about what was found during the examination.  If the procedure report does not answer your questions, please call your gastroenterologist to clarify.  If you requested that your care partner not be given the details of your procedure findings, then the procedure report has been included in a sealed envelope for you to review at your convenience later.  YOU SHOULD EXPECT: Some feelings of bloating in the abdomen. Passage of more gas than usual.  Walking can help get rid of the air that was put into your GI tract during the procedure and reduce the bloating. If you had a lower endoscopy (such as a colonoscopy or flexible sigmoidoscopy) you may notice spotting of blood in your stool or on the toilet paper. If you underwent a bowel prep for your procedure, you may not have a normal bowel movement for a few days.  Please Note:  You might notice some irritation and congestion in your nose or some drainage.  This is from the oxygen used during your procedure.  There is no need for concern and it should clear up in a day or so.  SYMPTOMS TO REPORT IMMEDIATELY:   Following lower endoscopy (colonoscopy or flexible sigmoidoscopy):  Excessive amounts of blood in the stool  Significant tenderness or worsening of abdominal pains  Swelling of the abdomen that is new, acute  Fever of 100F or higher   For urgent or emergent issues, a gastroenterologist can be reached at any hour by calling [phone].   DIET:  We do recommend a small meal at first, but then you may proceed to your regular diet.  Drink plenty of fluids but you should avoid alcoholic beverages for 24 hours.  ACTIVITY:  You should plan to take it easy for the rest of  today and you should NOT DRIVE or use heavy machinery until tomorrow (because of the sedation medicines used during the test).    FOLLOW UP: Our staff will call the number listed on your records 48-72 hours following your procedure to check on you and address any questions or concerns that you may have regarding the information given to you following your procedure. If we do not reach you, we will leave a message.  We will attempt to reach you two times.  During this call, we will ask if you have developed any symptoms of COVID 19. If you develop any symptoms (ie: fever, flu-like symptoms, shortness of breath, cough etc.) before then, please call [phone].  If you test positive for Covid 19 in the 2 weeks post procedure, please call and report this information to us.    If any biopsies were taken you will be contacted by phone or by letter within the next 1-3 weeks.  Please call us at [phone] if you have not heard about the biopsies in 3 weeks.    SIGNATURES/CONFIDENTIALITY: You and/or your care partner have signed paperwork which will be entered into your electronic medical record.  These signatures attest to the fact that that the information above on your After Visit Summary has been reviewed and is understood.  Full responsibility of the confidentiality of this discharge information lies with you and/or your care-partner. 

## 2018-11-05 NOTE — Op Note (Signed)
[facility] Patient Name: Brenda Russo Procedure Date: 11/05/2018 9:54 AM MRN: [id number] Endoscopist: Milus Banister , MD Age: 63 Referring MD:  Date of Birth: [birth year] Gender: Female Account #: [id number] Procedure:                Colonoscopy Indications:              Screening for colorectal malignant neoplasm Medicines:                Monitored Anesthesia Care Procedure:                Pre-Anesthesia Assessment:                           - Prior to the procedure, a History and Physical                            was performed, and patient medications and                            allergies were reviewed. The patient's tolerance of                            previous anesthesia was also reviewed. The risks                            and benefits of the procedure and the sedation                            options and risks were discussed with the patient.                            All questions were answered, and informed consent                            was obtained. Prior Anticoagulants: The patient has                            taken no previous anticoagulant or antiplatelet                            agents. ASA Grade Assessment: II - A patient with                            mild systemic disease. After reviewing the risks                            and benefits, the patient was deemed in                            satisfactory condition to undergo the procedure.                           After obtaining informed consent, the colonoscope  was passed under direct vision. Throughout the                            procedure, the patient's blood pressure, pulse, and                            oxygen saturations were monitored continuously. The                            Colonoscope was introduced through the anus and                            advanced to the the cecum, identified by                            appendiceal orifice  and ileocecal valve. The                            colonoscopy was performed without difficulty. The                            patient tolerated the procedure well. The quality                            of the bowel preparation was good. The ileocecal                            valve, appendiceal orifice, and rectum were                            photographed. Scope In: 10:04:03 AM Scope Out: 10:17:32 AM Scope Withdrawal Time: 0 hours 8 minutes 23 seconds  Total Procedure Duration: 0 hours 13 minutes 29 seconds  Findings:                 A 3 mm polyp was found in the ascending colon. The                            polyp was sessile. The polyp was removed with a                            cold snare. Resection and retrieval were complete.                           The exam was otherwise without abnormality on                            direct and retroflexion views. Complications:            No immediate complications. Estimated blood loss:                            None. Estimated Blood Loss:     Estimated blood loss: none. Impression:               -  One 3 mm polyp in the ascending colon, removed                            with a cold snare. Resected and retrieved.                           - The examination was otherwise normal on direct                            and retroflexion views. Recommendation:           - Patient has a contact number available for                            emergencies. The signs and symptoms of potential                            delayed complications were discussed with the                            patient. Return to normal activities tomorrow.                            Written discharge instructions were provided to the                            patient.                           - Resume previous diet.                           - Continue present medications.                           - Await pathology results. Likely repeat                             colonoscopy in 7-10 years. Milus Banister, MD 11/05/2018 10:19:09 AM This report has been signed electronically.

## 2018-11-05 NOTE — Progress Notes (Signed)
Called to room to assist during endoscopic procedure.  Patient ID and intended procedure confirmed with present staff. Received instructions for my participation in the procedure from the performing physician.  

## 2018-11-05 NOTE — Progress Notes (Signed)
To PACU, VSS. Report to [email or web address] 

## 2018-11-05 NOTE — Progress Notes (Signed)
Pt's states no medical or surgical changes since previsit or office visit.  Vitals CW Temp KA

## 2018-11-07 ENCOUNTER — Telehealth: Payer: Self-pay | Admitting: *Deleted

## 2018-11-07 DIAGNOSIS — H04123 Dry eye syndrome of bilateral lacrimal glands: Secondary | ICD-10-CM | POA: Diagnosis not present

## 2018-11-07 DIAGNOSIS — H25813 Combined forms of age-related cataract, bilateral: Secondary | ICD-10-CM | POA: Diagnosis not present

## 2018-11-07 DIAGNOSIS — H1045 Other chronic allergic conjunctivitis: Secondary | ICD-10-CM | POA: Diagnosis not present

## 2018-11-07 NOTE — Telephone Encounter (Signed)
1. Have you developed a fever since your procedure? No  2.   Have you had an respiratory symptoms (SOB or cough) since your procedure? No  3.   Have you tested positive for COVID 19 since your procedure no  4.   Have you had any family members/close contacts diagnosed with the COVID 19 since your procedure?  no   If yes to any of these questions please route to Joylene John, RN and Alphonsa Gin, Therapist, sports.  Follow up Call-  Call back number 11/05/2018  Post procedure Call Back phone  # [phone]  Permission to leave phone message Yes  Some recent data might be hidden     Patient questions:  Do you have a fever, pain , or abdominal swelling? No. Pain Score  0 *  Have you tolerated food without any problems? Yes.    Have you been able to return to your normal activities? Yes.    Do you have any questions about your discharge instructions: Diet   No. Medications  No. Follow up visit  No.  Do you have questions or concerns about your Care? No.  Actions: * If pain score is 4 or above: No action needed, pain <4.

## 2018-11-09 ENCOUNTER — Encounter: Payer: Self-pay | Admitting: Gastroenterology

## 2018-11-19 ENCOUNTER — Encounter: Payer: Self-pay | Admitting: Internal Medicine

## 2018-12-18 MED FILL — DIAZEPAM 10 MG TABS: 10 | 1 days supply | Qty: 2 | Fill #0

## 2019-01-07 ENCOUNTER — Other Ambulatory Visit: Payer: Self-pay

## 2019-01-07 ENCOUNTER — Encounter: Payer: Self-pay | Admitting: Family Medicine

## 2019-01-07 ENCOUNTER — Ambulatory Visit: Payer: 59 | Admitting: Family Medicine

## 2019-01-07 ENCOUNTER — Encounter: Payer: Self-pay | Admitting: Internal Medicine

## 2019-01-07 ENCOUNTER — Encounter (HOSPITAL_COMMUNITY): Payer: Self-pay | Admitting: Family Medicine

## 2019-01-07 VITALS — BP 112/55 | HR 69 | Temp 97.7°F | Wt 217.4 lb

## 2019-01-07 DIAGNOSIS — R42 Dizziness and giddiness: Secondary | ICD-10-CM | POA: Insufficient documentation

## 2019-01-07 DIAGNOSIS — R001 Bradycardia, unspecified: Secondary | ICD-10-CM

## 2019-01-07 DIAGNOSIS — Z87898 Personal history of other specified conditions: Secondary | ICD-10-CM | POA: Diagnosis not present

## 2019-01-07 DIAGNOSIS — I10 Essential (primary) hypertension: Secondary | ICD-10-CM | POA: Diagnosis not present

## 2019-01-07 DIAGNOSIS — R011 Cardiac murmur, unspecified: Secondary | ICD-10-CM | POA: Diagnosis not present

## 2019-01-07 LAB — CBC WITH DIFFERENTIAL/PLATELET
Basophils Absolute: 0.1 10*3/uL (ref 0.0–0.1)
Basophils Relative: 0.7 % (ref 0.0–3.0)
Eosinophils Absolute: 0.2 10*3/uL (ref 0.0–0.7)
Eosinophils Relative: 2.7 % (ref 0.0–5.0)
HCT: 42.2 % (ref 36.0–46.0)
Hemoglobin: 13.6 g/dL (ref 12.0–15.0)
Lymphocytes Relative: 29.8 % (ref 12.0–46.0)
Lymphs Abs: 2.2 10*3/uL (ref 0.7–4.0)
MCHC: 32.3 g/dL (ref 30.0–36.0)
MCV: 88.3 fl (ref 78.0–100.0)
Monocytes Absolute: 0.6 10*3/uL (ref 0.1–1.0)
Monocytes Relative: 8 % (ref 3.0–12.0)
Neutro Abs: 4.4 10*3/uL (ref 1.4–7.7)
Neutrophils Relative %: 58.8 % (ref 43.0–77.0)
Platelets: 301 10*3/uL (ref 150.0–400.0)
RBC: 4.78 Mil/uL (ref 3.87–5.11)
RDW: 13.8 % (ref 11.5–15.5)
WBC: 7.4 10*3/uL (ref 4.0–10.5)

## 2019-01-07 LAB — POC URINALSYSI DIPSTICK (AUTOMATED)
Bilirubin, UA: NEGATIVE
Blood, UA: NEGATIVE
Glucose, UA: NEGATIVE
Ketones, UA: NEGATIVE
Leukocytes, UA: NEGATIVE
Nitrite, UA: NEGATIVE
Protein, UA: NEGATIVE
Spec Grav, UA: 1.01 (ref 1.010–1.025)
Urobilinogen, UA: 0.2 E.U./dL
pH, UA: 8.5 — AB (ref 5.0–8.0)

## 2019-01-07 LAB — TSH: TSH: 2.56 u[IU]/mL (ref 0.35–4.50)

## 2019-01-07 LAB — VITAMIN B12: Vitamin B-12: 1356 pg/mL — ABNORMAL HIGH (ref 211–911)

## 2019-01-07 NOTE — Assessment & Plan Note (Signed)
HR good today but pt states it is dropping at home and causing dizziness

## 2019-01-07 NOTE — Patient Instructions (Signed)
Dizziness Dizziness is a common problem. It is a feeling of unsteadiness or light-headedness. You may feel like you are about to faint. Dizziness can lead to injury if you stumble or fall. Anyone can become dizzy, but dizziness is more common in older adults. This condition can be caused by a number of things, including medicines, dehydration, or illness. Follow these instructions at home: Eating and drinking  Drink enough fluid to keep your urine clear or pale yellow. This helps to keep you from becoming dehydrated. Try to drink more clear fluids, such as water.  Do not drink alcohol.  Limit your caffeine intake if told to do so by your health care provider. Check ingredients and nutrition facts to see if a food or beverage contains caffeine.  Limit your salt (sodium) intake if told to do so by your health care provider. Check ingredients and nutrition facts to see if a food or beverage contains sodium. Activity  Avoid making quick movements. ? Rise slowly from chairs and steady yourself until you feel okay. ? In the morning, first sit up on the side of the bed. When you feel okay, stand slowly while you hold onto something until you know that your balance is fine.  If you need to stand in one place for a long time, move your legs often. Tighten and relax the muscles in your legs while you are standing.  Do not drive or use heavy machinery if you feel dizzy.  Avoid bending down if you feel dizzy. Place items in your home so that they are easy for you to reach without leaning over. Lifestyle  Do not use any products that contain nicotine or tobacco, such as cigarettes and e-cigarettes. If you need help quitting, ask your health care provider.  Try to reduce your stress level by using methods such as yoga or meditation. Talk with your health care provider if you need help to manage your stress. General instructions  Watch your dizziness for any changes.  Take over-the-counter and  prescription medicines only as told by your health care provider. Talk with your health care provider if you think that your dizziness is caused by a medicine that you are taking.  Tell a friend or a family member that you are feeling dizzy. If he or she notices any changes in your behavior, have this person call your health care provider.  Keep all follow-up visits as told by your health care provider. This is important. Contact a health care provider if:  Your dizziness does not go away.  Your dizziness or light-headedness gets worse.  You feel nauseous.  You have reduced hearing.  You have new symptoms.  You are unsteady on your feet or you feel like the room is spinning. Get help right away if:  You vomit or have diarrhea and are unable to eat or drink anything.  You have problems talking, walking, swallowing, or using your arms, hands, or legs.  You feel generally weak.  You are not thinking clearly or you have trouble forming sentences. It may take a friend or family member to notice this.  You have chest pain, abdominal pain, shortness of breath, or sweating.  Your vision changes.  You have any bleeding.  You have a severe headache.  You have neck pain or a stiff neck.  You have a fever. These symptoms may represent a serious problem that is an emergency. Do not wait to see if the symptoms will go away. Get medical help   right away. Call your local emergency services (911 in the [place] Do not drive yourself to the hospital. Summary  Dizziness is a feeling of unsteadiness or light-headedness. This condition can be caused by a number of things, including medicines, dehydration, or illness.  Anyone can become dizzy, but dizziness is more common in older adults.  Drink enough fluid to keep your urine clear or pale yellow. Do not drink alcohol.  Avoid making quick movements if you feel dizzy. Monitor your dizziness for any changes. This information is not intended to  replace advice given to you by your health care provider. Make sure you discuss any questions you have with your health care provider. Document Released: 07/20/2000 Document Revised: 01/27/2017 Document Reviewed: 02/27/2016 Elsevier Patient Education  2020 [employer].

## 2019-01-07 NOTE — Progress Notes (Signed)
Patient ID: Brenda Russo, female    DOB: [birth year]  Age: 63 y.o. MRN: [id number]    Subjective:  Subjective  HPI Brenda Russo presents for low heart rate and dizziness this am.  She has a hx of bradycardia and saw Dr Caryl Comes in 2018.   She had and episode in Sept and then today. No chest pain / sob    Dizziness worse with change of position   Review of Systems  Constitutional: Negative for appetite change, diaphoresis, fatigue and unexpected weight change.  Eyes: Negative for pain, redness and visual disturbance.  Respiratory: Negative for cough, chest tightness, shortness of breath and wheezing.   Cardiovascular: Negative for chest pain, palpitations and leg swelling.  Endocrine: Negative for cold intolerance, heat intolerance, polydipsia, polyphagia and polyuria.  Genitourinary: Negative for difficulty urinating, dysuria and frequency.  Neurological: Positive for dizziness and light-headedness. Negative for syncope, weakness, numbness and headaches.    History Past Medical History:  Diagnosis Date  . Allergic rhinitis   . Allergy   . Anemia   . Anxiety   . Asthma   . HTN (hypertension)   . Hyperlipidemia    borderline  . Obese   . OSA (obstructive sleep apnea)    mild ,no CPAP  . Overweight(278.02)   . Plantar wart    history  . Prediabetes   . Shortness of breath   . Sleep apnea     She has a past surgical history that includes Cesarean section; Tubal ligation; Breast biopsy (Right, 09/02/2015); Colonoscopy (11/05/2018); and Colonoscopy (2010).   Her family history includes Asthma in her father; Breast cancer in her maternal aunt; Heart disease in her mother; Hyperlipidemia in her mother; Hypertension in her mother; Obesity in her mother; Pancreatic cancer in her brother.She reports that she has never smoked. She has never used smokeless tobacco. She reports that she does not drink alcohol or use drugs.  Current Outpatient Medications on File Prior to Visit   Medication Sig Dispense Refill  . albuterol (VENTOLIN HFA) 108 (90 Base) MCG/ACT inhaler Inhale 1-2 puffs into the lungs every 4 (four) hours as needed for wheezing or shortness of breath. 18 g 5  . amLODipine (NORVASC) 5 MG tablet TAKE 1 TABLET BY MOUTH ONCE DAILY 90 tablet 3  . Coenzyme Q10 (COQ10) 100 MG CAPS Take 1 capsule by mouth daily.    . Ergocalciferol 2500 units CAPS Take 5,000 Units by mouth daily. 30 capsule 0  . fluticasone (FLONASE) 50 MCG/ACT nasal spray Place 2 sprays into both nostrils daily. 16 g 5  . fluticasone furoate-vilanterol (BREO ELLIPTA) 200-25 MCG/INH AEPB Inhale 1 puff into the lungs daily.    . hydrochlorothiazide (HYDRODIURIL) 12.5 MG tablet Take 1 tablet (12.5 mg total) by mouth daily. 90 tablet 2  . losartan (COZAAR) 100 MG tablet Take 1 tablet (100 mg total) by mouth daily. 90 tablet 2  . magnesium oxide (MAG-OX) 400 MG tablet 2 tablets daily. 400-800 mg daily    . Multiple Minerals-Vitamins (CALCIUM CITRATE PLUS/MAGNESIUM) TABS Take 1 tablet by mouth.    . Multiple Vitamins-Minerals (COMPLETE WOMENS PO) Take 1 capsule by mouth 2 (two) times daily.      . Omega-3 Fatty Acids (FISH OIL) 1000 MG CAPS Take 1 capsule by mouth.    . [DISCONTINUED] losartan-hydrochlorothiazide (HYZAAR) 100-12.5 MG tablet Take 1 tablet by mouth daily. 90 tablet 1   No current facility-administered medications on file prior to visit.      Objective:  Objective  Physical Exam Vitals signs and nursing note reviewed.  Constitutional:      Appearance: She is well-developed.  HENT:     Head: Normocephalic and atraumatic.  Eyes:     Conjunctiva/sclera: Conjunctivae normal.  Neck:     Musculoskeletal: Normal range of motion and neck supple.     Thyroid: No thyromegaly.     Vascular: No carotid bruit or JVD.  Cardiovascular:     Rate and Rhythm: Normal rate and regular rhythm.     Heart sounds: Murmur present.  Pulmonary:     Effort: Pulmonary effort is normal. No respiratory  distress.     Breath sounds: Normal breath sounds. No wheezing or rales.  Chest:     Chest wall: No tenderness.  Neurological:     Mental Status: She is alert and oriented to person, place, and time.    BP (!) 112/55   Pulse 69   Temp 97.7 F (36.5 C) (Temporal)   Wt 217 lb 6.4 oz (98.6 kg)   SpO2 98%   BMI 36.18 kg/m  Wt Readings from Last 3 Encounters:  01/07/19 217 lb 6.4 oz (98.6 kg)  11/05/18 214 lb (97.1 kg)  10/22/18 214 lb 6.4 oz (97.3 kg)     Lab Results  Component Value Date   WBC 6.3 05/09/2017   HGB 14.0 05/09/2017   HCT 42.8 05/09/2017   PLT 315 05/09/2017   GLUCOSE 95 03/27/2018   CHOL 190 03/27/2018   TRIG 45 03/27/2018   HDL 66 03/27/2018   LDLCALC 115 (H) 03/27/2018   ALT 12 03/27/2018   AST 17 03/27/2018   NA 142 03/27/2018   K 4.2 03/27/2018   CL 106 03/27/2018   CREATININE 0.80 03/27/2018   BUN 16 03/27/2018   CO2 21 03/27/2018   TSH 2.460 05/09/2017   HGBA1C 5.6 03/27/2018   EKG-- sinus bradycardia   Dg Chest 2 View  Result Date: 03/17/2018 CLINICAL DATA:  Patient with congestion EXAM: CHEST - 2 VIEW COMPARISON:  Chest radiograph 12/24/2015 FINDINGS: Normal cardiac and mediastinal contours. No consolidative pulmonary opacities. No pleural effusion or pneumothorax. Thoracic spine degenerative changes. IMPRESSION: No acute cardiopulmonary process. Electronically Signed   By: Lovey Newcomer M.D.   On: 03/17/2018 14:32     Assessment & Plan:  Plan  I have discontinued Brenda Russo "Brenda Russo"'s Arnuity Ellipta and Fluticasone Furoate-Vilanterol (BREO ELLIPTA IN). I am also having her maintain her Multiple Vitamins-Minerals (COMPLETE WOMENS PO), magnesium oxide, CoQ10, Calcium Citrate Plus/Magnesium, Fish Oil, Ergocalciferol, fluticasone, albuterol, amLODipine, hydrochlorothiazide, losartan, and Breo Ellipta.  No orders of the defined types were placed in this encounter.   Problem List Items Addressed This Visit      Unprioritized    Bradycardia    HR good today but pt states it is dropping at home and causing dizziness      Dizzy - Primary   Relevant Orders   EKG 12-Lead (Completed)   CBC with Differential   TSH   Vitamin B12   POCT Urinalysis Dipstick (Automated) (Completed)   Ambulatory referral to Cardiology   ECHOCARDIOGRAM COMPLETE   Essential hypertension    Well controlled, no changes to meds. Encouraged heart healthy diet such as the DASH diet and exercise as tolerated.       Relevant Orders   EKG 12-Lead (Completed)   CBC with Differential   TSH   Vitamin B12   POCT Urinalysis Dipstick (Automated) (Completed)   ECHOCARDIOGRAM COMPLETE  Murmur    Check echo F/u cardiology      Relevant Orders   ECHOCARDIOGRAM COMPLETE    Other Visit Diagnoses    History of bradycardia       Relevant Orders   Ambulatory referral to Cardiology      Follow-up: Return if symptoms worsen or fail to improve.  Ann Held, DO

## 2019-01-07 NOTE — Assessment & Plan Note (Signed)
Well controlled, no changes to meds. Encouraged heart healthy diet such as the DASH diet and exercise as tolerated.  °

## 2019-01-07 NOTE — Assessment & Plan Note (Signed)
Check echo F/u cardiology

## 2019-01-09 ENCOUNTER — Ambulatory Visit (HOSPITAL_COMMUNITY): Payer: 59 | Attending: Cardiovascular Disease

## 2019-01-09 ENCOUNTER — Other Ambulatory Visit: Payer: Self-pay

## 2019-01-09 DIAGNOSIS — I10 Essential (primary) hypertension: Secondary | ICD-10-CM

## 2019-01-09 DIAGNOSIS — R011 Cardiac murmur, unspecified: Secondary | ICD-10-CM | POA: Diagnosis not present

## 2019-01-09 DIAGNOSIS — R42 Dizziness and giddiness: Secondary | ICD-10-CM | POA: Diagnosis not present

## 2019-01-10 ENCOUNTER — Encounter: Payer: Self-pay | Admitting: Family Medicine

## 2019-01-11 NOTE — Telephone Encounter (Signed)
See echo

## 2019-01-18 MED FILL — BREO ELLIPTA 200-25 MCG INH: 200-25 | 90 days supply | Qty: 180 | Fill #2

## 2019-01-18 MED FILL — LOSARTAN POTASSIUM 100 MG T: 100 | 90 days supply | Qty: 90 | Fill #1

## 2019-01-18 MED FILL — AMLODIPINE BESYLATE 5 MG TA: 5 | 90 days supply | Qty: 90 | Fill #2

## 2019-01-18 MED FILL — HYDROCHLOROTHIAZIDE 12.5 MG: 12.5 | 90 days supply | Qty: 90 | Fill #1

## 2019-01-23 MED FILL — AMOXICILLIN 500 MG CAPSULE: 500 | 7 days supply | Qty: 21 | Fill #0

## 2019-01-23 MED FILL — HYDROCODON-APAP 5-325: 5-325 | 2 days supply | Qty: 12 | Fill #0

## 2019-01-23 MED FILL — CHLORHEXIDINE 0.12% RINSE: 0.12 | 16 days supply | Qty: 473 | Fill #0

## 2019-02-12 ENCOUNTER — Other Ambulatory Visit: Payer: Self-pay

## 2019-02-12 ENCOUNTER — Encounter: Payer: Self-pay | Admitting: Internal Medicine

## 2019-02-12 ENCOUNTER — Ambulatory Visit: Payer: 59 | Admitting: Internal Medicine

## 2019-02-12 VITALS — BP 156/106 | HR 63 | Ht 65.0 in | Wt 196.0 lb

## 2019-02-12 DIAGNOSIS — I493 Ventricular premature depolarization: Secondary | ICD-10-CM | POA: Diagnosis not present

## 2019-02-12 NOTE — Patient Instructions (Addendum)
Medication Instructions:  Your physician recommends that you continue on your current medications as directed. Please refer to the Current Medication list given to you today.  *If you need a refill on your cardiac medications before your next appointment, please call your pharmacy*  Lab Work: None ordered.  If you have labs (blood work) drawn today and your tests are completely normal, you will receive your results only by: Marland Kitchen MyChart Message (if you have MyChart) OR . A paper copy in the mail If you have any lab test that is abnormal or we need to change your treatment, we will call you to review the results.  Testing/Procedures: None ordered.   Follow-Up: At [facility], you and your health needs are our priority.  As part of our continuing mission to provide you with exceptional heart care, we have created designated Provider Care Teams.  These Care Teams include your primary Cardiologist (physician) and Advanced Practice Providers (APPs -  Physician Assistants and Nurse Practitioners) who all work together to provide you with the care you need, when you need it.  Your next appointment:    One year with Dr Caryl Comes

## 2019-02-12 NOTE — Progress Notes (Signed)
ELECTROPHYSIOLOGY CONSULT NOTE  Patient ID: Brenda Russo, MRN: [id number], DOB/AGE: 63-Jul-1957 63 y.o. Admit date: (Not on file) Date of Consult: 02/12/2019  Primary Physician: Colon Branch, MD Primary Cardiologist: new  Consulting Physician Dr Etter Sjogren  Chief Complaint: bradycardia  HPI Brenda Russo is a 64 y.o. female  Referred back because of episodes of bradycardia associated with high blood pressure.  No clear triggers.  No associated lightheadedness.  She has a history of sleep apnea  She has a history of bradycardia which is mostly been associated with PVCs.  No significant sinus bradycardia has been identified  No chest pain or shortness of breath.  Interested in losing weight.  She has had no syncope  Past Medical History:  Diagnosis Date  . Allergic rhinitis   . Allergy   . Anemia   . Anxiety   . Asthma   . HTN (hypertension)   . Hyperlipidemia    borderline  . Obese   . OSA (obstructive sleep apnea)    mild ,no CPAP  . Overweight(278.02)   . Plantar wart    history  . Prediabetes   . Shortness of breath   . Sleep apnea       Surgical History:  Past Surgical History:  Procedure Laterality Date  . BREAST BIOPSY Right 09/02/2015  . CESAREAN SECTION    . COLONOSCOPY  11/05/2018  . COLONOSCOPY  2010  . TUBAL LIGATION       Home Meds: Prior to Admission medications   Medication Sig Start Date End Date Taking? Authorizing Provider  albuterol (PROVENTIL) (2.5 MG/3ML) 0.083% nebulizer solution Take 3 mLs (2.5 mg total) by nebulization 3 (three) times daily as needed. 05/20/15   Colon Branch, MD  albuterol (VENTOLIN HFA) 108 (90 Base) MCG/ACT inhaler Inhale 1-2 puffs into the lungs every 4 (four) hours as needed for wheezing. 12/24/15   Noralee Space, MD  amLODipine (NORVASC) 5 MG tablet Take 1 tablet (5 mg total) by mouth daily. Patient taking differently: Take 2.5 mg by mouth daily.  12/24/15   Colon Branch, MD  Ascorbic Acid (VITAMIN C) 1000  MG tablet Take 1,000 mg by mouth daily.    Historical Provider, MD  B Complex-C-Folic Acid (B-COMPLEX BALANCED PO) Take by mouth.    Historical Provider, MD  Cholecalciferol (VITAMIN D3) 5000 UNITS CAPS Take 1 capsule by mouth daily.    Historical Provider, MD  Coenzyme Q10 (COQ10) 100 MG CAPS Take 1 capsule by mouth daily.    Historical Provider, MD  fluticasone furoate-vilanterol (BREO ELLIPTA) 200-25 MCG/INH AEPB Inhale 1 puff into the lungs daily. 12/24/15   Noralee Space, MD  losartan-hydrochlorothiazide (HYZAAR) 100-12.5 MG tablet Take 1 tablet by mouth daily. Patient taking differently: Take 0.5 tablets by mouth daily.  12/24/15   Colon Branch, MD  magnesium oxide (MAG-OX) 400 MG tablet 2 tablets daily. 400-800 mg daily    Historical Provider, MD  Multiple Minerals-Vitamins (CALCIUM CITRATE PLUS/MAGNESIUM) TABS Take 1 tablet by mouth.    Historical Provider, MD  Multiple Vitamins-Minerals (COMPLETE WOMENS PO) Take 1 capsule by mouth 2 (two) times daily.      Historical Provider, MD  Omega-3 Fatty Acids (FISH OIL) 1000 MG CAPS Take 1 capsule by mouth.    Historical Provider, MD  pantoprazole (PROTONIX) 40 MG tablet Take 1 tablet (40 mg total) by mouth daily. 30 minutes before the last meal of the day 12/24/15   Deborra Medina  Lenna Gilford, MD    Allergies:  Allergies  Allergen Reactions  . Eggs Or Egg-Derived Products     Shortness of breath, wheezing    Social History   Socioeconomic History  . Marital status: Married    Spouse name: Not on file  . Number of children: 3  . Years of education: Not on file  . Highest education level: Not on file  Occupational History  . 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 Smoking status: Never Smoker  . Smokeless tobacco: Never Used  Substance and Sexual Activity  . Alcohol use: No  . Drug use: No  . Sexual activity: Not Currently  Other Topics Concern  . Not on file  Social History  Narrative   3 biological children   1 adopted child   3 step-children   Household-- pt , husband and a son   Social Determinants of Radio broadcast assistant Strain:   . Difficulty of Paying Living Expenses: Not on file  Food Insecurity:   . Worried About Charity fundraiser in the Last Year: Not on file  . Ran Out of Food in the Last Year: Not on file  Transportation Needs:   . Lack of Transportation (Medical): Not on file  . Lack of Transportation (Non-Medical): Not on file  Physical Activity:   . Days of Exercise per Week: Not on file  . Minutes of Exercise per Session: Not on file  Stress:   . Feeling of Stress : Not on file  Social Connections:   . Frequency of Communication with Friends and Family: Not on file  . Frequency of Social Gatherings with Friends and Family: Not on file  . Attends Religious Services: Not on file  . Active Member of Clubs or Organizations: Not on file  . Attends Archivist Meetings: Not on file  . Marital Status: Not on file  Intimate Partner Violence:   . Fear of Current or Ex-Partner: Not on file  . Emotionally Abused: Not on file  . Physically Abused: Not on file  . Sexually Abused: Not on file     Family History  Problem Relation Age of Onset  . Heart disease Mother        pacemaker   . Hypertension Mother   . Hyperlipidemia Mother   . Obesity Mother   . Asthma Father   . Pancreatic cancer Brother   . Breast cancer Maternal Aunt   . Colon cancer Neg Hx   . Esophageal cancer Neg Hx   . Stomach cancer Neg Hx   . Rectal cancer Neg Hx      ROS:  Please see the history of present illness.     All other systems reviewed and negative.    Physical Exam:  Blood pressure (!) 156/106, pulse 63, height 5\' 5"  (1.651 m), weight 196 lb (88.9 kg), SpO2 96 %. BP (!) 156/106   Pulse 63   Ht 5\' 5"  (1.651 m)   Wt 196 lb (88.9 kg)   SpO2 96%   BMI 32.62 kg/m  Well developed and nourished in no acute distress HENT normal Neck  supple with JVP-  flat   Clear Regular rate and rhythm, no murmurs or gallops Abd-soft with active BS No Clubbing cyanosis edema Skin-warm and dry A & Oriented  Grossly normal sensory and motor function  Labs: Cardiac Enzymes No results for input(s): CKTOTAL, CKMB, TROPONINI in the last 72 hours. CBC Lab Results  Component Value Date   WBC 7.4 01/07/2019   HGB 13.6 01/07/2019   HCT 42.2 01/07/2019   MCV 88.3 01/07/2019   PLT 301.0 01/07/2019   PROTIME: No results for input(s): LABPROT, INR in the last 72 hours. Chemistry No results for input(s): NA, K, CL, CO2, BUN, CREATININE, CALCIUM, PROT, BILITOT, ALKPHOS, ALT, AST, GLUCOSE in the last 168 hours.  Invalid input(s): LABALBU Lipids Lab Results  Component Value Date   CHOL 190 03/27/2018   HDL 66 03/27/2018   LDLCALC 115 (H) 03/27/2018   TRIG 45 03/27/2018   BNP No results found for: PROBNP Thyroid Function Tests: No results for input(s): TSH, T4TOTAL, T3FREE, THYROIDAB in the last 72 hours.  Invalid input(s): FREET3 Miscellaneous No results found for: DDIMER  Radiology/Studies:  No results found.  EKG:  Sinus at 63 Intervals 19/08/43 Otherwise normal  Assessment and Plan:  Sinus bradycardia-chronic  PVCs  Hypertension  Obesity  Sleep disordered breathing Patient has intermittent bradycardia which is likely related to her PVCs.  We have discussed the use of the alive core monitor to confirm that.  She is interested in losing weight.  We discussed the [employer].  She also has sleep disordered breathing but hopefully with loss of weight, her blood pressure will become under better control as what her propensity towards sleep apnea.     Virl Axe

## 2019-04-26 ENCOUNTER — Encounter: Payer: Self-pay | Admitting: Internal Medicine

## 2019-04-26 ENCOUNTER — Other Ambulatory Visit: Payer: Self-pay | Admitting: Internal Medicine

## 2019-04-26 MED FILL — ALBUTEROL SULFATE HFA 108 (: 108 (90 BAS | 17 days supply | Qty: 18 | Fill #0

## 2019-04-26 MED FILL — LOSARTAN POTASSIUM 100 MG T: 100 | 90 days supply | Qty: 90 | Fill #2

## 2019-04-26 MED FILL — AMLODIPINE BESYLATE 5 MG TA: 5 | 90 days supply | Qty: 90 | Fill #3

## 2019-04-26 MED FILL — HYDROCHLOROTHIAZIDE 12.5 MG: 12.5 | 90 days supply | Qty: 90 | Fill #2

## 2019-04-26 MED FILL — BREO ELLIPTA 200-25 MCG INH: 200-25 | 90 days supply | Qty: 180 | Fill #3

## 2019-05-02 ENCOUNTER — Other Ambulatory Visit: Payer: Self-pay

## 2019-05-02 ENCOUNTER — Telehealth: Payer: Self-pay | Admitting: *Deleted

## 2019-05-02 ENCOUNTER — Ambulatory Visit: Payer: 59 | Admitting: Podiatry

## 2019-05-02 ENCOUNTER — Other Ambulatory Visit: Payer: Self-pay | Admitting: Podiatry

## 2019-05-02 ENCOUNTER — Ambulatory Visit (INDEPENDENT_AMBULATORY_CARE_PROVIDER_SITE_OTHER): Payer: 59

## 2019-05-02 DIAGNOSIS — M722 Plantar fascial fibromatosis: Secondary | ICD-10-CM

## 2019-05-02 DIAGNOSIS — M79671 Pain in right foot: Secondary | ICD-10-CM | POA: Diagnosis not present

## 2019-05-02 DIAGNOSIS — B351 Tinea unguium: Secondary | ICD-10-CM | POA: Diagnosis not present

## 2019-05-02 DIAGNOSIS — M79672 Pain in left foot: Secondary | ICD-10-CM | POA: Diagnosis not present

## 2019-05-02 DIAGNOSIS — M778 Other enthesopathies, not elsewhere classified: Secondary | ICD-10-CM

## 2019-05-02 DIAGNOSIS — M19079 Primary osteoarthritis, unspecified ankle and foot: Secondary | ICD-10-CM | POA: Diagnosis not present

## 2019-05-02 DIAGNOSIS — M779 Enthesopathy, unspecified: Secondary | ICD-10-CM

## 2019-05-02 MED ORDER — MELOXICAM 15 MG PO TABS: 15.0000 mg | ORAL_TABLET | Freq: Every day | ORAL | 2 refills | Status: DC

## 2019-05-02 NOTE — Telephone Encounter (Signed)
done

## 2019-05-02 NOTE — Telephone Encounter (Signed)
Pt states she was seen by Dr. Jacqualyn Posey today and he was to call in a prescritpion the [facility].

## 2019-05-03 ENCOUNTER — Telehealth: Payer: Self-pay | Admitting: *Deleted

## 2019-05-03 MED ORDER — DICLOFENAC SODIUM 1 % EX GEL: 2.0000 g | Freq: Four times a day (QID) | CUTANEOUS | 5 refills | Status: DC

## 2019-05-03 MED FILL — MELOXICAM 15 MG TABLET: 15 | 30 days supply | Qty: 30 | Fill #0

## 2019-05-03 MED FILL — DICLOFENAC SODIUM 1 % GEL: 1 | 13 days supply | Qty: 100 | Fill #0

## 2019-05-03 NOTE — Addendum Note (Signed)
Addended by: Harriett Sine D on: 05/03/2019 05:07 PM   Modules accepted: Orders

## 2019-05-03 NOTE — Telephone Encounter (Signed)
Pt states she was to receive gel for the inflammation and stretches. Dr. Jacqualyn Posey wanted pt to tendonitis stretches one minute each 2-3 times a day and OTC Voltaren gel. I gave the instructions to pt.

## 2019-05-03 NOTE — Telephone Encounter (Signed)
Pt came back into the building and asked to have the Voltaren gel sent to the [place]. done

## 2019-05-03 NOTE — Patient Instructions (Signed)

## 2019-05-06 NOTE — Progress Notes (Signed)
Subjective: 64 year old female presents the office today for concerns of pain to the top of her right foot is been ongoing for about 1 years but intermittent.  She presented injection performed by Dr. Paulla Dolly which was helpful but she was to hold off another injection.  She also started to get some pain on the bottom of her left heel last 2 to 3 days mainly after working and taken off her shoes.  Describes aching sensation.  No recent injury or trauma.  No swelling.  Also she is concerned that her toenails as far as the discoloration and thickening.  She has history of her nails coming off.  No pain to the nails no redness or drainage.  No recent treatment. Denies any systemic complaints such as fevers, chills, nausea, vomiting. No acute changes since last appointment, and no other complaints at this time.   Objective: AAO x3, NAD DP/PT pulses palpable bilaterally, CRT less than 3 seconds There is dorsal spurring at the right midfoot and this is where she has majority of tenderness and swelling.  Flexor, extensor tendons appear to be intact bilaterally.  On the left side there is tenderness on the plantar medial tubercle of the calcaneus at the insertion plantar fascia.  Plantar fascial appears to be intact.  Achilles tendon intact.  No pain about compression of calcaneus. Nails are mildly hypertrophic, dystrophic with discoloration.  No hyperpigmented changes No open lesions or pre-ulcerative lesions.  No pain with calf compression, swelling, warmth, erythema  Assessment: Right dorsal midfoot bone spur, arthritis/capsulitis; left foot plantar fasciitis; onychomycosis  Plan: -All treatment options discussed with the patient including all alternatives, risks, complications.  -X-rays obtained reviewed.  Arthritic changes present in the midfoot dorsal spurring with right side worse than left.  No evidence of acute fracture bilaterally. -She was told of a steroid injections.  Also we discussed orthotics.   We will have her follow-up with Liliane Channel for inserts.  Discussed shoe modifications as well.  Regards the plantar fasciitis discussed stretching, icing exercises daily. -Prescribed mobic. Discussed side effects of the medication and directed to stop if any are to occur and call the office.  -Nail sent for culture to [facility] labs and the specimen was given to [place], [place].  -Patient encouraged to call the office with any questions, concerns, change in symptoms.   Trula Slade DPM

## 2019-05-17 ENCOUNTER — Other Ambulatory Visit: Payer: Self-pay

## 2019-05-17 ENCOUNTER — Ambulatory Visit (INDEPENDENT_AMBULATORY_CARE_PROVIDER_SITE_OTHER): Payer: 59 | Admitting: Orthotics

## 2019-05-17 DIAGNOSIS — M722 Plantar fascial fibromatosis: Secondary | ICD-10-CM

## 2019-05-17 DIAGNOSIS — M19079 Primary osteoarthritis, unspecified ankle and foot: Secondary | ICD-10-CM

## 2019-05-17 DIAGNOSIS — M779 Enthesopathy, unspecified: Secondary | ICD-10-CM

## 2019-05-17 DIAGNOSIS — M79672 Pain in left foot: Secondary | ICD-10-CM

## 2019-05-27 ENCOUNTER — Telehealth: Payer: Self-pay | Admitting: *Deleted

## 2019-05-27 DIAGNOSIS — Z79899 Other long term (current) drug therapy: Secondary | ICD-10-CM

## 2019-05-27 DIAGNOSIS — B351 Tinea unguium: Secondary | ICD-10-CM

## 2019-05-27 MED FILL — MELOXICAM 15 MG TABLET: 15 | 30 days supply | Qty: 30 | Fill #0

## 2019-05-27 MED FILL — DICLOFENAC SODIUM 1 % GEL: 1 | 13 days supply | Qty: 100 | Fill #0

## 2019-05-27 NOTE — Telephone Encounter (Signed)
Pt returning Dr. Leigh Aurora call for results. I told pt I would have Dr. Jacqualyn Posey give me the results and orders in needed.

## 2019-05-28 NOTE — Telephone Encounter (Signed)
Pt called for results. I told pt I had not received instructions from Dr. Jacqualyn Posey at this time, but would call once received.

## 2019-05-29 NOTE — Telephone Encounter (Signed)
I gave copy of messages and results to Dr. Leigh Aurora assistant, L. Cox, CMA.

## 2019-05-30 DIAGNOSIS — Z79899 Other long term (current) drug therapy: Secondary | ICD-10-CM | POA: Diagnosis not present

## 2019-05-30 DIAGNOSIS — B351 Tinea unguium: Secondary | ICD-10-CM | POA: Diagnosis not present

## 2019-05-30 MED ORDER — ITRACONAZOLE 200 MG PO TABS: 1.0000 | ORAL_TABLET | Freq: Every day | ORAL | 0 refills | Status: DC

## 2019-05-30 MED ORDER — GENTAMICIN SULFATE 0.1 % EX OINT: TOPICAL_OINTMENT | CUTANEOUS | 0 refills | Status: DC

## 2019-05-30 MED ORDER — ITRACONAZOLE 100 MG PO CAPS: ORAL_CAPSULE | ORAL | 0 refills | Status: DC

## 2019-05-30 MED FILL — GENTAMICIN 0.1% OINTMENT: 0.1 | 14 days supply | Qty: 15 | Fill #0

## 2019-05-30 MED FILL — ITRACONAZOLE 100 MG CAPS: 100 | 90 days supply | Qty: 180 | Fill #0

## 2019-05-30 NOTE — Telephone Encounter (Signed)
[place] states Itraconazole 200mg  #90 will cost pt over $600.00 and will not be covered, if could order Itraconzole 100mg  capsules #180 2 capsules daily it would be covered. Pt called with the same information. Dr. Berton Lan change to the Itraconazole 100mg  #180 2 capsules daily.

## 2019-05-30 NOTE — Telephone Encounter (Signed)
Dr. Jacqualyn Posey reviewed fungal culture and ordered Hepatic function and CBC with diff prior to beginning, itraconazole 200mg  #90 one tablet daily and gentamicin ointment apply to toenail and edge daily for 2 weeks. I informed pt and she states understanding.

## 2019-05-30 NOTE — Addendum Note (Signed)
Addended by: Harriett Sine D on: 05/30/2019 11:45 AM   Modules accepted: Orders

## 2019-05-31 ENCOUNTER — Telehealth: Payer: Self-pay | Admitting: *Deleted

## 2019-05-31 LAB — HEPATIC FUNCTION PANEL
AG Ratio: 1.2 (calc) (ref 1.0–2.5)
ALT: 21 U/L (ref 6–29)
AST: 22 U/L (ref 10–35)
Albumin: 3.8 g/dL (ref 3.6–5.1)
Alkaline phosphatase (APISO): 91 U/L (ref 37–153)
Bilirubin, Direct: 0.1 mg/dL (ref 0.0–0.2)
Globulin: 3.1 g/dL (calc) (ref 1.9–3.7)
Indirect Bilirubin: 0.4 mg/dL (calc) (ref 0.2–1.2)
Total Bilirubin: 0.5 mg/dL (ref 0.2–1.2)
Total Protein: 6.9 g/dL (ref 6.1–8.1)

## 2019-05-31 LAB — CBC WITH DIFFERENTIAL/PLATELET
Absolute Monocytes: 431 cells/uL (ref 200–950)
Basophils Absolute: 62 cells/uL (ref 0–200)
Basophils Relative: 0.8 %
Eosinophils Absolute: 200 cells/uL (ref 15–500)
Eosinophils Relative: 2.6 %
HCT: 43.2 % (ref 35.0–45.0)
Hemoglobin: 14 g/dL (ref 11.7–15.5)
Lymphs Abs: 2156 cells/uL (ref 850–3900)
MCH: 28.6 pg (ref 27.0–33.0)
MCHC: 32.4 g/dL (ref 32.0–36.0)
MCV: 88.3 fL (ref 80.0–100.0)
MPV: 11.8 fL (ref 7.5–12.5)
Monocytes Relative: 5.6 %
Neutro Abs: 4851 cells/uL (ref 1500–7800)
Neutrophils Relative %: 63 %
Platelets: 293 10*3/uL (ref 140–400)
RBC: 4.89 10*6/uL (ref 3.80–5.10)
RDW: 12.4 % (ref 11.0–15.0)
Total Lymphocyte: 28 %
WBC: 7.7 10*3/uL (ref 3.8–10.8)

## 2019-05-31 NOTE — Telephone Encounter (Signed)
Left message informing pt she should wait until our office calls with results and instructions.

## 2019-05-31 NOTE — Telephone Encounter (Signed)
Pt asked if she should start her medication after she has the labs performed.

## 2019-06-04 ENCOUNTER — Other Ambulatory Visit: Payer: Self-pay | Admitting: *Deleted

## 2019-06-04 ENCOUNTER — Telehealth: Payer: Self-pay | Admitting: *Deleted

## 2019-06-04 MED ORDER — DICLOFENAC SODIUM 1 % EX GEL: 2.0000 g | Freq: Four times a day (QID) | CUTANEOUS | 1 refills | Status: DC

## 2019-06-04 MED ORDER — MELOXICAM 15 MG PO TABS: 15.0000 mg | ORAL_TABLET | Freq: Every day | ORAL | 2 refills | Status: DC

## 2019-06-04 NOTE — Telephone Encounter (Signed)
Pt returning L. Cox, CMA call.

## 2019-06-04 NOTE — Telephone Encounter (Signed)
Called and left a message for the patient to call me back and I did go ahead and refilled the meloxicam and the voltaren gel per Dr Jacqualyn Posey to the [place]. Lattie Haw

## 2019-06-06 MED FILL — DICLOFENAC SODIUM 1 % GEL: 1 | 13 days supply | Qty: 100 | Fill #0

## 2019-06-13 ENCOUNTER — Other Ambulatory Visit: Payer: Self-pay

## 2019-06-13 ENCOUNTER — Ambulatory Visit: Payer: 59 | Admitting: Podiatry

## 2019-06-13 ENCOUNTER — Ambulatory Visit: Payer: 59 | Admitting: Orthotics

## 2019-06-13 ENCOUNTER — Encounter: Payer: Self-pay | Admitting: Podiatry

## 2019-06-13 DIAGNOSIS — M722 Plantar fascial fibromatosis: Secondary | ICD-10-CM | POA: Diagnosis not present

## 2019-06-13 DIAGNOSIS — Z79899 Other long term (current) drug therapy: Secondary | ICD-10-CM | POA: Diagnosis not present

## 2019-06-13 DIAGNOSIS — B351 Tinea unguium: Secondary | ICD-10-CM

## 2019-06-13 DIAGNOSIS — M779 Enthesopathy, unspecified: Secondary | ICD-10-CM | POA: Diagnosis not present

## 2019-06-13 NOTE — Telephone Encounter (Signed)
Patient is coming into the office today. Brenda Russo

## 2019-06-13 NOTE — Progress Notes (Signed)
P/u foot orthotics but did not have accomodating foot wear.

## 2019-06-13 NOTE — Patient Instructions (Signed)
Itraconazole capsules and tablets °What is this medicine? °ITRACONAZOLE (i tra KO na zole) is an antifungal medicine. It is used to treat certain kinds of fungal or yeast infections. °This medicine may be used for other purposes; ask your health care provider or pharmacist if you have questions. °COMMON BRAND NAME(S): ONMEL, Sporanox, TOLSURA °What should I tell my health care provider before I take this medicine? °They need to know if you have any of these conditions: °· heart disease °· history of irregular heartbeat °· immune system problems °· kidney disease °· liver disease °· lung or breathing disease °· an unusual or allergic reaction to itraconazole, or other antifungal medicines, foods, dyes or preservatives °· pregnant or trying to get pregnant °· breast-feeding °How should I use this medicine? °Take this medicine by mouth with a full glass of water. Follow the directions on the prescription label. Take this medicine with food. Avoid taking antacids, H2-blockers, or proton pump inhibitors within 2 hours of taking this medicine. It is best to separate these medicines by 2 hours. Take your medicine at regular intervals. Do not take your medicine more often than directed. Take all of your medicine as directed even if you think you are better. Do not skip doses or stop your medicine early. °Talk to your pediatrician regarding the use of this medicine in children. Special care may be needed. °Overdosage: If you think you have taken too much of this medicine contact a poison control center or emergency room at once. °NOTE: This medicine is only for you. Do not share this medicine with others. °What if I miss a dose? °If you miss a dose, take it as soon as you can. If it is almost time for your next dose, take only that dose. Do not take double or extra doses. °What may interact with this medicine? °Do not take this medicine with any of the following medications: °· alfuzosin °· alprazolam °· avanafil °· certain  medicines for blood pressure like felodipine, nisoldipine °· certain medicines for cholesterol like cerivastatin, lovastatin, simvastatin, lomitapide °· certain medicines for the heart like disopyramide, dofetilide, dronedarone, eplerenone, ivabradine, quinidine, ranolazine °· cisapride °· colchicine (if you have liver or kidney problems) °· conivaptan °· ergot alkaloids like dihydroergotamine, ergonovine, ergotamine, methylergonovine °· irinotecan °· isavuconazole °· lurasidone °· methadone °· midazolam °· naloxegol °· nevirapine °· other medicines that prolong the QT interval (cause an abnormal heart rhythm) °· pimozide °· red yeast rice °· sirolimus °· thioridazine °· ticagrelor °· triazolam °This medicine may also interact with the following medications: °· aliskiren °· amlodipine °· antacids °· antiviral medicines for HIV or AIDS °· aprepitant °· atorvastatin °· buprenorphine °· certain antibiotics like ciprofloxacin, clarithromycin, erythromycin °· certain medicines for bladder problems like fesoterodine, solifenacin, tolterodine °· certain medicines for cancer like axitinib, bortezomib, busulfan, dabrafenib, dasatinib, docetaxel, erlotinib, gefitinib, ibrutinib, imatinib, ixabepilone, lapatinib, nilotinib, ponatinib, sunitinib, trabectedin, trimetrexate, vinca alkaloids °· certain medicines for depression, anxiety, or psychotic disturbances like aripiprazole, buspirone, diazepam, haloperidol, perospirone, quetiapine, risperidone °· certain medicines for erectile dysfunction like vardenafil, sildenafil, tadalafil °· certain medicines for pain like alfentanil, fentanyl, oxycodone, sufentanil °· certain medicines for stomach problems like cimetidine, famotidine, omeprazole, lansoprazole °· certain medicines that treat or prevent blood clots like warfarin, dabigatran, rivaroxaban °· certain medicines for seizures like carbamazepine, phenytoin °· certain medicines for tuberculosis like isoniazid, INH, rifabutin,  rifampin, rifapentine °· cilostazol °· cinacalcet °· cyclosporine °· digoxin °· eletriptan °· everolimus °· halofantrine °· isradipine °· meloxicam °· nadolol °·   nifedipine °· other medicines for fungal infections °· praziquantel °· ramelteon °· repaglinide °· salmeterol °· saxagliptin °· steroid medicines like budesonide, ciclesonide, dexamethasone, fluticasone, methylprednisolone °· tacrolimus °· tamsulosin °· tolvaptan °· verapamil °· ziprasidone °This list may not describe all possible interactions. Give your health care provider a list of all the medicines, herbs, non-prescription drugs, or dietary supplements you use. Also tell them if you smoke, drink alcohol, or use illegal drugs. Some items may interact with your medicine. °What should I watch for while using this medicine? °Tell your doctor or healthcare professional if your symptoms do not start to get better or if they get worse. °You may get drowsy or dizzy. Do not drive, use machinery, or do anything that needs mental alertness until you know how the medicine affects you. Do not stand or sit up quickly, especially if you are an older patient. This reduces the risk of dizzy or fainting spells. °What side effects may I notice from receiving this medicine? °Side effects that you should report to your doctor or health care professional as soon as possible: °· allergic reactions like skin rash, itching or hives, swelling of the face, lips, or tongue °· changes in hearing °· pain, tingling, numbness in the hands or feet °· signs and symptoms of heart failure like breathing problems; fast, irregular heartbeat; sudden weight gain; swelling of the ankles, feet; unusually weak or tired °· signs and symptoms of liver injury like dark yellow or brown urine; general ill feeling or flu-like symptoms; light-colored stools; loss of appetite; right upper belly pain; yellowing of the eyes or skin °Side effects that usually do not require medical attention (report to  your doctor or health care professional if they continue or are bothersome): °· blurred vision °· diarrhea °· dizziness °· headache °· nausea, vomiting °This list may not describe all possible side effects. Call your doctor for medical advice about side effects. You may report side effects to FDA at 1-800-FDA-1088. °Where should I keep my medicine? °Keep out of the reach of children. °Store at room temperature between 15 and 25 degrees C (59 and 77 degrees F). Protect from light and moisture. Throw away any unused medicine after the expiration date. °NOTE: This sheet is a summary. It may not cover all possible information. If you have questions about this medicine, talk to your doctor, pharmacist, or health care provider. °© 2020 Elsevier/Gold Standard (2018-01-15 13:38:38) ° ° °

## 2019-06-17 ENCOUNTER — Ambulatory Visit: Payer: 59 | Admitting: Podiatry

## 2019-06-18 ENCOUNTER — Encounter: Payer: Self-pay | Admitting: Internal Medicine

## 2019-06-18 DIAGNOSIS — H25813 Combined forms of age-related cataract, bilateral: Secondary | ICD-10-CM | POA: Diagnosis not present

## 2019-06-18 DIAGNOSIS — H04123 Dry eye syndrome of bilateral lacrimal glands: Secondary | ICD-10-CM | POA: Diagnosis not present

## 2019-06-18 DIAGNOSIS — H1045 Other chronic allergic conjunctivitis: Secondary | ICD-10-CM | POA: Diagnosis not present

## 2019-06-24 NOTE — Progress Notes (Signed)
Subjective: 64 year old female presents the office today for follow-up evaluation of nail fungal culture results as well as for foot pain.  She is on meloxicam which is been helping.  She gets some pain in the bottom of her left heel which hurts on and when she first gets up.  She denies any recent changes otherwise. Denies any systemic complaints such as fevers, chills, nausea, vomiting. No acute changes since last appointment, and no other complaints at this time.   Objective: AAO x3, NAD DP/PT pulses palpable bilaterally, CRT less than 3 seconds Nail exam is mostly unchanged.  There is no pain the nails currently there is no redness or drainage or any signs of infection.  The nails are still mildly hypertrophic, dystrophic and discolored. Minimal tenderness palpation on plantar medial tubercle of the calcaneus at the insertion of plantar fascial.  Chronic rash appears to be intact.  Dorsal spurring of the right midfoot.  Flexor, extensor tendons appear intact. No open lesions or pre-ulcerative lesions.  No pain with calf compression, swelling, warmth, erythema  Assessment: Right foot tendinitis, left foot plantar fasciitis; onychomycosis  Plan: -All treatment options discussed with the patient including all alternatives, risks, complications.  -Reviewed the nail culture results with her.  We will start itraconazole 200 mg daily.  Discussed side effects medication.  We will recheck blood work in 4 to 6 weeks.  Lab work was given to her today for recheck. -Continue with stretching, icing daily.  Supportive shoes and she picks up orthotics with a break.  Meloxicam as needed. -Patient encouraged to call the office with any questions, concerns, change in symptoms.   Return in about 3 months (around 09/13/2019) for nail fungus .  Trula Slade DPM

## 2019-06-25 ENCOUNTER — Telehealth: Payer: Self-pay | Admitting: Podiatry

## 2019-06-25 NOTE — Telephone Encounter (Signed)
Pt left message for Liliane Channel to call her back.  I returned call and pt stated orthotics not working for her and she would like orthotics similar to the ones she had many years ago. I told pt I could schedule her to see Liliane Channel and to bring the old orthotics with her for him to look at and see what can be done.

## 2019-07-15 ENCOUNTER — Ambulatory Visit: Payer: 59 | Admitting: Orthotics

## 2019-07-15 ENCOUNTER — Other Ambulatory Visit: Payer: Self-pay

## 2019-07-15 DIAGNOSIS — M722 Plantar fascial fibromatosis: Secondary | ICD-10-CM

## 2019-07-15 NOTE — Progress Notes (Signed)
[place]ong f/o to make it dress

## 2019-07-18 ENCOUNTER — Telehealth: Payer: Self-pay | Admitting: Internal Medicine

## 2019-07-18 NOTE — Telephone Encounter (Signed)
Last pulmonary visit 09/12/2018, Breo Ellipta stopped, Rx Arnuity Ellipta thus to my knowledge she is on [employer], refill it as previously prescribed. Also she is due for a CPX

## 2019-07-18 NOTE — Telephone Encounter (Signed)
Please verify dose on Breo Ellipta.

## 2019-07-18 NOTE — Telephone Encounter (Signed)
Appt scheduled on 08/07/2019.

## 2019-07-31 DIAGNOSIS — Z79899 Other long term (current) drug therapy: Secondary | ICD-10-CM | POA: Diagnosis not present

## 2019-08-01 LAB — HEPATIC FUNCTION PANEL
AG Ratio: 1.3 (calc) (ref 1.0–2.5)
ALT: 13 U/L (ref 6–29)
AST: 15 U/L (ref 10–35)
Albumin: 4 g/dL (ref 3.6–5.1)
Alkaline phosphatase (APISO): 88 U/L (ref 37–153)
Bilirubin, Direct: 0.2 mg/dL (ref 0.0–0.2)
Globulin: 3 g/dL (calc) (ref 1.9–3.7)
Indirect Bilirubin: 0.5 mg/dL (calc) (ref 0.2–1.2)
Total Bilirubin: 0.7 mg/dL (ref 0.2–1.2)
Total Protein: 7 g/dL (ref 6.1–8.1)

## 2019-08-01 LAB — CBC WITH DIFFERENTIAL/PLATELET
Absolute Monocytes: 555 cells/uL (ref 200–950)
Basophils Absolute: 56 cells/uL (ref 0–200)
Basophils Relative: 0.6 %
Eosinophils Absolute: 169 cells/uL (ref 15–500)
Eosinophils Relative: 1.8 %
HCT: 41.8 % (ref 35.0–45.0)
Hemoglobin: 13.6 g/dL (ref 11.7–15.5)
Lymphs Abs: 2134 cells/uL (ref 850–3900)
MCH: 28.5 pg (ref 27.0–33.0)
MCHC: 32.5 g/dL (ref 32.0–36.0)
MCV: 87.6 fL (ref 80.0–100.0)
MPV: 11.4 fL (ref 7.5–12.5)
Monocytes Relative: 5.9 %
Neutro Abs: 6486 cells/uL (ref 1500–7800)
Neutrophils Relative %: 69 %
Platelets: 293 10*3/uL (ref 140–400)
RBC: 4.77 10*6/uL (ref 3.80–5.10)
RDW: 13 % (ref 11.0–15.0)
Total Lymphocyte: 22.7 %
WBC: 9.4 10*3/uL (ref 3.8–10.8)

## 2019-08-07 ENCOUNTER — Other Ambulatory Visit: Payer: Self-pay

## 2019-08-07 ENCOUNTER — Encounter: Payer: Self-pay | Admitting: Internal Medicine

## 2019-08-07 ENCOUNTER — Ambulatory Visit (INDEPENDENT_AMBULATORY_CARE_PROVIDER_SITE_OTHER): Payer: 59 | Admitting: Internal Medicine

## 2019-08-07 VITALS — BP 112/62 | HR 58 | Temp 97.0°F | Resp 18 | Ht 65.0 in | Wt 220.5 lb

## 2019-08-07 DIAGNOSIS — Z0000 Encounter for general adult medical examination without abnormal findings: Secondary | ICD-10-CM | POA: Diagnosis not present

## 2019-08-07 DIAGNOSIS — Z114 Encounter for screening for human immunodeficiency virus [HIV]: Secondary | ICD-10-CM | POA: Diagnosis not present

## 2019-08-07 DIAGNOSIS — Z1159 Encounter for screening for other viral diseases: Secondary | ICD-10-CM | POA: Diagnosis not present

## 2019-08-07 DIAGNOSIS — Z1231 Encounter for screening mammogram for malignant neoplasm of breast: Secondary | ICD-10-CM

## 2019-08-07 LAB — BASIC METABOLIC PANEL
BUN: 17 mg/dL (ref 6–23)
CO2: 29 mEq/L (ref 19–32)
Calcium: 9.1 mg/dL (ref 8.4–10.5)
Chloride: 103 mEq/L (ref 96–112)
Creatinine, Ser: 0.76 mg/dL (ref 0.40–1.20)
GFR: 93.09 mL/min (ref >60.00)
Glucose, Bld: 110 mg/dL — ABNORMAL HIGH (ref 70–99)
Potassium: 3.8 mEq/L (ref 3.5–5.1)
Sodium: 139 mEq/L (ref 135–145)

## 2019-08-07 LAB — LIPID PANEL
Cholesterol: 164 mg/dL (ref 0–200)
HDL: 55.2 mg/dL (ref >39.00)
LDL Cholesterol: 97 mg/dL (ref 0–99)
NonHDL: 108.6
Total CHOL/HDL Ratio: 3
Triglycerides: 60 mg/dL (ref 0.0–149.0)
VLDL: 12 mg/dL (ref 0.0–40.0)

## 2019-08-07 LAB — HEMOGLOBIN A1C: Hgb A1c MFr Bld: 5.8 % (ref 4.6–6.5)

## 2019-08-07 NOTE — Progress Notes (Signed)
Pre visit review using our clinic review tool, if applicable. No additional management support is needed unless otherwise documented below in the visit note. 

## 2019-08-07 NOTE — Assessment & Plan Note (Signed)
--  Tdap ,pnm shot, shingrex, covid : declined, pro>cons d/w pt - She does get flu shots @ work  --Female care: Dr. Garwin Brothers , PAP 04/2018 per [facility]8/2019, referral sent  --[facility]--08/19/08- normal, C-scope 10/2018, polyps, next per GI (Dr Ardis Hughs) --dexa6-2017 WNL --diet and exercise discussed today --labs reviewed: BMP, FLP, A1c, hep C, HIV

## 2019-08-07 NOTE — Progress Notes (Signed)
Subjective:    Patient ID: Brenda Russo, female    DOB: Aug 15, 1955, 64 y.o.   MRN: [id number]  DOS:  08/07/2019 Type of visit - description: CPX No major concerns.    Review of Systems   A 14 point review of systems is negative     Past Medical History:  Diagnosis Date  . Allergic rhinitis   . Allergy   . Anemia   . Anxiety   . Asthma   . HTN (hypertension)   . Hyperlipidemia    borderline  . Obese   . OSA (obstructive sleep apnea)    mild ,no CPAP  . Overweight(278.02)   . Plantar wart    history  . Prediabetes   . Sleep apnea     Past Surgical History:  Procedure Laterality Date  . BREAST BIOPSY Right 09/02/2015  . CESAREAN SECTION    . COLONOSCOPY  11/05/2018  . COLONOSCOPY  2010  . TUBAL LIGATION      Allergies as of 08/07/2019      Reactions   Eggs Or Egg-derived Products    Shortness of breath, wheezing      Medication List       Accurate as of August 07, 2019  9:10 PM. If you have any questions, ask your nurse or doctor.        STOP taking these medications   diclofenac Sodium 1 % Gel Commonly known as: Voltaren Stopped by: Kathlene November, MD   gentamicin ointment 0.1 % Commonly known as: GARAMYCIN Stopped by: Kathlene November, MD   losartan-hydrochlorothiazide 100-12.5 MG tablet Commonly known as: HYZAAR Stopped by: Kathlene November, MD     TAKE these medications   albuterol 108 (90 Base) MCG/ACT inhaler Commonly known as: VENTOLIN HFA Inhale 1-2 puffs into the lungs every 4 (four) hours as needed for wheezing or shortness of breath.   amLODipine 5 MG tablet Commonly known as: NORVASC Take 1 tablet (5 mg total) by mouth daily.   Breo Ellipta 200-25 MCG/INH Aepb Generic drug: fluticasone furoate-vilanterol Inhale 1 puff into the lungs in the morning and at bedtime.   Calcium Citrate Plus/Magnesium Tabs Take 1 tablet by mouth.   COMPLETE WOMENS PO Take 1 capsule by mouth 2 (two) times daily.   CoQ10 100 MG Caps Take 1 capsule by mouth  daily.   Ergocalciferol 62.5 MCG (2500 UT) Caps Take 5,000 Units by mouth daily.   Fish Oil 1000 MG Caps Take 1 capsule by mouth.   fluticasone 50 MCG/ACT nasal spray Commonly known as: FLONASE Place 2 sprays into both nostrils daily.   hydrochlorothiazide 12.5 MG tablet Commonly known as: HYDRODIURIL Take 1 tablet (12.5 mg total) by mouth daily.   itraconazole 100 MG capsule Commonly known as: SPORANOX Take 2 capsules daily.   losartan 100 MG tablet Commonly known as: COZAAR Take 1 tablet (100 mg total) by mouth daily.   magnesium oxide 400 MG tablet Commonly known as: MAG-OX 2 tablets daily. 400-800 mg daily   meloxicam 15 MG tablet Commonly known as: MOBIC Take 1 tablet (15 mg total) by mouth daily.          Objective:   Physical Exam BP 112/62 (BP Location: Left Arm, Patient Position: Sitting, Cuff Size: Normal)   Pulse (!) 58   Temp (!) 97 F (36.1 C) (Temporal)   Resp 18   Ht 5\' 5"  (1.651 m)   Wt 220 lb 8 oz (100 kg)   SpO2 98%  BMI 36.69 kg/m  General: Well developed, NAD, BMI noted Neck: No  thyromegaly  HEENT:  Normocephalic . Face symmetric, atraumatic Lungs:  CTA B Normal respiratory effort, no intercostal retractions, no accessory muscle use. Heart: Occasional irregular heartbeat,  no murmur.  Abdomen:  Not distended, soft, non-tender. No rebound or rigidity.   Lower extremities: no pretibial edema bilaterally  Skin: Exposed areas without rash. Not pale. Not jaundice Neurologic:  alert & oriented X3.  Speech normal, gait appropriate for age and unassisted Strength symmetric and appropriate for age.  Psych: Cognition and judgment appear intact.  Cooperative with normal attention span and concentration.  Behavior appropriate. No anxious or depressed appearing.     Assessment     Assessment Prediabetes A1c 6.0 (05/2014) HTN Sinus bradycardia-PVCs: Saw cardiology 04/26/2017. She was asx RTC  prn Hyperlipidemia Anxiety Obesity Asthma - h/o intubation x2 in the 90s  OSA-mild, dx 2014 , never tried CPAP BTL LLQ abd pain on-off , last GI visit 11-2015, CT abd (-)  PLAN: Here for CPX Prediabetes: Check A1c HTN: Continue amlodipine, HCTZ, losartan, checking labs, normal ambulatory BPs at home Hyperlipidemia: Diet controlled, checking labs Obesity: Discussed diet exercise History of OSA: Mild, never treated, epworth today scored 5 (mild) Onychomycosis: On Sporanox: Rx by podiatry, last LFTs normal. RTC 6 months   This visit occurred during the SARS-CoV-2 public health emergency.  Safety protocols were in place, including screening questions prior to the visit, additional usage of staff PPE, and extensive cleaning of exam room while observing appropriate contact time as indicated for disinfecting solutions.

## 2019-08-07 NOTE — Patient Instructions (Addendum)
COVID-19 Vaccine Information can be found at: [email or web address].uk For questions related to vaccine distribution or appointments, please email [email or web address] or call [phone].   Check the  blood pressure 2 or 3 times a month   BP GOAL is between 110/65 and  135/85. If it is consistently higher or lower, let me know    GO TO THE LAB : Get the blood work     Prue, [place] back for a checkup in 6 months

## 2019-08-07 NOTE — Assessment & Plan Note (Signed)
Here for CPX Prediabetes: Check A1c HTN: Continue amlodipine, HCTZ, losartan, checking labs, normal ambulatory BPs at home Hyperlipidemia: Diet controlled, checking labs Obesity: Discussed diet exercise History of OSA: Mild, never treated, epworth today scored 5 (mild) Onychomycosis: On Sporanox: Rx by podiatry, last LFTs normal. RTC 6 months

## 2019-08-08 ENCOUNTER — Ambulatory Visit: Payer: 59 | Admitting: Orthotics

## 2019-08-08 DIAGNOSIS — M722 Plantar fascial fibromatosis: Secondary | ICD-10-CM

## 2019-08-08 LAB — HEPATITIS C ANTIBODY
Hepatitis C Ab: NONREACTIVE
SIGNAL TO CUT-OFF: 0.02 (ref <1.00)

## 2019-08-08 LAB — HIV ANTIBODY (ROUTINE TESTING W REFLEX): HIV 1&2 Ab, 4th Generation: NONREACTIVE

## 2019-08-08 NOTE — Progress Notes (Signed)
Patient came in today to pick up custom made foot orthotics.  The goals were accomplished and the patient reported no dissatisfaction with said orthotics.  Patient was advised of breakin period and how to report any issues. 

## 2019-08-09 ENCOUNTER — Telehealth: Payer: Self-pay | Admitting: *Deleted

## 2019-08-09 NOTE — Telephone Encounter (Signed)
-----   Message from Trula Slade, DPM sent at 08/05/2019  8:11 AM EDT ----- Blood work is normal- finish course of itraconazole

## 2019-08-09 NOTE — Telephone Encounter (Signed)
I informed of Dr. Leigh Aurora review of results and orders.

## 2019-08-30 ENCOUNTER — Encounter: Payer: Self-pay | Admitting: Internal Medicine

## 2019-09-16 ENCOUNTER — Telehealth: Payer: Self-pay | Admitting: Pulmonary Disease

## 2019-09-16 ENCOUNTER — Ambulatory Visit: Payer: 59 | Admitting: Internal Medicine

## 2019-09-16 ENCOUNTER — Other Ambulatory Visit: Payer: Self-pay

## 2019-09-16 VITALS — BP 136/65 | HR 53 | Temp 98.3°F | Resp 12 | Ht 65.0 in | Wt 221.0 lb

## 2019-09-16 DIAGNOSIS — R001 Bradycardia, unspecified: Secondary | ICD-10-CM | POA: Diagnosis not present

## 2019-09-16 NOTE — Progress Notes (Signed)
Subjective:    Patient ID: Brenda Russo, female    DOB: 1955/11/03, 64 y.o.   MRN: [id number]  DOS:  09/16/2019 Type of visit - description: Acute Yesterday she was feeling slightly more tired than usual, check her vital signs: BP was very good but her heart rate was 30, this was documented manually as well as with the BP cuff and pulse oximeter. At the time she did not have chest pain, difficulty breathing or edema. She does have cell phone program that showed bigeminy.  Today she feels better    Review of Systems See above   Past Medical History:  Diagnosis Date  . Allergic rhinitis   . Allergy   . Anemia   . Anxiety   . Asthma   . HTN (hypertension)   . Hyperlipidemia    borderline  . Obese   . OSA (obstructive sleep apnea)    mild ,no CPAP  . Overweight(278.02)   . Plantar wart    history  . Prediabetes   . Sleep apnea     Past Surgical History:  Procedure Laterality Date  . BREAST BIOPSY Right 09/02/2015  . CESAREAN SECTION    . COLONOSCOPY  11/05/2018  . COLONOSCOPY  2010  . TUBAL LIGATION      Allergies as of 09/16/2019      Reactions   Eggs Or Egg-derived Products    Shortness of breath, wheezing      Medication List       Accurate as of September 16, 2019  3:17 PM. If you have any questions, ask your nurse or doctor.        STOP taking these medications   meloxicam 15 MG tablet Commonly known as: MOBIC Stopped by: Kathlene November, MD     TAKE these medications   albuterol 108 (90 Base) MCG/ACT inhaler Commonly known as: VENTOLIN HFA Inhale 1-2 puffs into the lungs every 4 (four) hours as needed for wheezing or shortness of breath.   amLODipine 5 MG tablet Commonly known as: NORVASC Take 1 tablet (5 mg total) by mouth daily.   Breo Ellipta 200-25 MCG/INH Aepb Generic drug: fluticasone furoate-vilanterol Inhale 1 puff into the lungs in the morning and at bedtime.   Calcium Citrate Plus/Magnesium Tabs Take 1 tablet by mouth.   COMPLETE  WOMENS PO Take 1 capsule by mouth 2 (two) times daily.   CoQ10 100 MG Caps Take 1 capsule by mouth daily.   Ergocalciferol 62.5 MCG (2500 UT) Caps Take 5,000 Units by mouth daily.   Fish Oil 1000 MG Caps Take 1 capsule by mouth.   fluticasone 50 MCG/ACT nasal spray Commonly known as: FLONASE Place 2 sprays into both nostrils daily.   hydrochlorothiazide 12.5 MG tablet Commonly known as: HYDRODIURIL Take 1 tablet (12.5 mg total) by mouth daily.   itraconazole 100 MG capsule Commonly known as: SPORANOX Take 2 capsules daily.   losartan 100 MG tablet Commonly known as: COZAAR Take 1 tablet (100 mg total) by mouth daily.   magnesium oxide 400 MG tablet Commonly known as: MAG-OX 2 tablets daily. 400-800 mg daily          Objective:   Physical Exam BP 136/65 (BP Location: Right Arm, Cuff Size: Large)   Pulse (!) 53   Temp 98.3 F (36.8 C) (Oral)   Resp 12   Ht 5\' 5"  (1.651 m)   Wt 221 lb (100.2 kg)   SpO2 98%   BMI 36.78 kg/m  General:  Well developed, NAD, BMI noted. HEENT:  Normocephalic . Face symmetric, atraumatic Lungs:  CTA B Normal respiratory effort, no intercostal retractions, no accessory muscle use. Heart: RRR,  no murmur. Heart rate: 60 Lower extremities: no pretibial edema bilaterally  Skin: Not pale. Not jaundice Neurologic:  alert & oriented X3.  Speech normal, gait appropriate for age and unassisted Psych--  Cognition and judgment appear intact.  Cooperative with normal attention span and concentration.  Behavior appropriate. No anxious or depressed appearing.      Assessment      Assessment Prediabetes A1c 6.0 (05/2014) HTN Sinus bradycardia-PVCs: Saw cards 04/26/2017. Saw Dr Caryl Comes  02/2019 Hyperlipidemia Anxiety Obesity Asthma - h/o intubation x2 in the 90s  OSA-mild, dx 2014 , never tried CPAP BTL LLQ abd pain on-off , last GI visit 11-2015, CT abd (-)  PLAN: Bradycardia, bigeminy (documented by her cell phone): As  described above, slightly symptomatic. Last visit with cardiology, Dr. Caryl Comes 02/12/2019, he felt that bradycardia likely related to PVCs. Recent bradycardia likely due to bigeminy. EKG today: Lots of interference but she is in sinus rhythm, rate 60. Last TSH normal Plan: Keep appointment with cardiology 10/02/2019.  Call if symptoms severe    This visit occurred during the SARS-CoV-2 public health emergency.  Safety protocols were in place, including screening questions prior to the visit, additional usage of staff PPE, and extensive cleaning of exam room while observing appropriate contact time as indicated for disinfecting solutions.

## 2019-09-16 NOTE — Patient Instructions (Signed)
Continue monitoring your heart rate and blood pressure.  If you have bradycardia and also feeling extremely weak, fainty, sweaty: Go to the ER.  Keep the appointment with cardiology

## 2019-09-16 NOTE — Telephone Encounter (Signed)
Called patient, she has already made an appointment with Dr. Melvyn Novas for September.

## 2019-09-17 NOTE — Assessment & Plan Note (Signed)
Bradycardia, bigeminy (documented by her cell phone): As described above, slightly symptomatic. Last visit with cardiology, Dr. Caryl Comes 02/12/2019, he felt that bradycardia likely related to PVCs. Recent bradycardia likely due to bigeminy. EKG today: Lots of interference but she is in sinus rhythm, rate 60. Last TSH normal Plan: Keep appointment with cardiology 10/02/2019.  Call if symptoms severe

## 2019-09-19 ENCOUNTER — Ambulatory Visit: Payer: 59 | Admitting: Podiatry

## 2019-09-19 ENCOUNTER — Other Ambulatory Visit: Payer: Self-pay

## 2019-09-19 DIAGNOSIS — M722 Plantar fascial fibromatosis: Secondary | ICD-10-CM

## 2019-09-19 DIAGNOSIS — B351 Tinea unguium: Secondary | ICD-10-CM

## 2019-09-19 MED ORDER — CICLOPIROX 8 % EX SOLN: Freq: Every day | CUTANEOUS | 4 refills | Status: DC

## 2019-09-19 MED ORDER — DICLOFENAC SODIUM 1 % EX GEL: 2.0000 g | Freq: Four times a day (QID) | CUTANEOUS | 2 refills | Status: DC

## 2019-09-19 MED FILL — DICLOFENAC SODIUM 1 % GEL: 1 | 13 days supply | Qty: 100 | Fill #0

## 2019-09-19 MED FILL — CICLOPIROX 8 % SOLN: 8 | 30 days supply | Qty: 7 | Fill #0

## 2019-09-19 NOTE — Patient Instructions (Signed)

## 2019-09-22 NOTE — Progress Notes (Signed)
Subjective: 64 year old female presents the office today for follow-up evaluation of nail fungal culture results as well as for foot pain.  Overall she states that she is doing well.  No significant pain to her heels and the toenails are improving.  She has been off itraconazole and she is starting to see improvement in the nails are growing out.  She is not quite finished the medication.  No drainage or pain to the toenail sites.  Her last A1c was 5.8. Denies any systemic complaints such as fevers, chills, nausea, vomiting. No acute changes since last appointment, and no other complaints at this time.   Objective: AAO x3, NAD DP/PT pulses palpable bilaterally, CRT less than 3 seconds There is clear on the proximal nail fold with the remainder the nail still appears mildly hypertrophic, dystrophic with discoloration.  There is no pain in the nail there is no redness or drainage or signs of infection. There is no sign of tenderness palpation along the plantar fascial course or insertion.  No pain lateral compression of calcaneus no pain with Achilles tendon.  No other areas of discomfort identified today. No open lesions. No pain with calf compression, swelling, warmth, erythema  Assessment: Onychauxis with improvement as well as improving plantar fasciitis  Plan: -All treatment options discussed with the patient including all alternatives, risks, complications.  -As a courtesy debrided the nails with any complications or bleeding.  She will finish the course of itraconazole I will also prescribe Penlac 3 continue to use.  If needed we can do another round of itraconazole in the future as this has been helpful. -Prescribe Voltaren gel to use as needed for the plantar fasciitis.  Continue stretching, rehab exercises.  Return if symptoms worsen or fail to improve.  Trula Slade DPM

## 2019-09-30 ENCOUNTER — Other Ambulatory Visit: Payer: Self-pay | Admitting: Internal Medicine

## 2019-09-30 MED FILL — AMLODIPINE BESYLATE 5 MG TA: 5 | 90 days supply | Qty: 90 | Fill #0

## 2019-09-30 MED FILL — HYDROCHLOROTHIAZIDE 12.5 MG: 12.5 | 90 days supply | Qty: 90 | Fill #0

## 2019-09-30 MED FILL — LOSARTAN POTASSIUM 100 MG T: 100 | 90 days supply | Qty: 90 | Fill #0

## 2019-10-01 MED FILL — BREO ELLIPTA 200-25 MCG INH: 200-25 | 30 days supply | Qty: 60 | Fill #1

## 2019-10-01 NOTE — Progress Notes (Signed)
PCP:  Colon Branch, MD Primary Cardiologist: No primary care provider on file. Electrophysiologist: Virl Axe, MD   Brenda Russo is a 64 y.o. female seen today for Virl Axe, MD for routine electrophysiology followup.  Since last being seen in our clinic the patient reports doing very well. She comes today with reports of slow heart rates on her automatic BP cuff at home, reportedly as low as 30s. She has not taken EKGs on her Alivecor to correlate. Her lowest HR by alivecor is 46, and this in the setting of a brief period of bigeminy. Otherwise, she is doing very well. She recently got off anti-fungals for a toe issue and was also worried this may have been affecting her HR. She denies symptoms of lightheadedness, dizziness, syncope, or near syncope. No SOB. 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 No exertional component. Improves with stretching and massage.   Past Medical History:  Diagnosis Date  . Allergic rhinitis   . Allergy   . Anemia   . Anxiety   . Asthma   . HTN (hypertension)   . Hyperlipidemia    borderline  . Obese   . OSA (obstructive sleep apnea)    mild ,no CPAP  . Overweight(278.02)   . Plantar wart    history  . Prediabetes   . Sleep apnea    Past Surgical History:  Procedure Laterality Date  . BREAST BIOPSY Right 09/02/2015  . CESAREAN SECTION    . COLONOSCOPY  11/05/2018  . COLONOSCOPY  2010  . TUBAL LIGATION      Current Outpatient Medications  Medication Sig Dispense Refill  . albuterol (VENTOLIN HFA) 108 (90 Base) MCG/ACT inhaler Inhale 1-2 puffs into the lungs every 4 (four) hours as needed for wheezing or shortness of breath. 18 g 5  . amLODipine (NORVASC) 5 MG tablet Take 1 tablet (5 mg total) by mouth daily. 90 tablet 1  . ciclopirox (PENLAC) 8 % solution Apply topically at bedtime. Apply over nail and surrounding skin. Apply daily over previous coat. After seven (7) days, may  remove with alcohol and continue cycle. 6.6 mL 4  . Coenzyme Q10 (COQ10) 100 MG CAPS Take 1 capsule by mouth daily.    . diclofenac Sodium (VOLTAREN) 1 % GEL Apply 2 g topically 4 (four) times daily. Rub into affected area of foot 2 to 4 times daily 100 g 2  . Ergocalciferol 2500 units CAPS Take 5,000 Units by mouth daily. 30 capsule 0  . fluticasone (FLONASE) 50 MCG/ACT nasal spray Place 2 sprays into both nostrils daily. 16 g 5  . fluticasone furoate-vilanterol (BREO ELLIPTA) 200-25 MCG/INH AEPB Inhale 1 puff into the lungs in the morning and at bedtime. 180 each 0  . hydrochlorothiazide (HYDRODIURIL) 12.5 MG tablet Take 1 tablet (12.5 mg total) by mouth daily. 90 tablet 1  . losartan (COZAAR) 100 MG tablet Take 1 tablet (100 mg total) by mouth daily. 90 tablet 1  . magnesium oxide (MAG-OX) 400 MG tablet 2 tablets daily. 400-800 mg daily    . Multiple Minerals-Vitamins (CALCIUM CITRATE PLUS/MAGNESIUM) TABS Take 1 tablet by mouth.    . Multiple Vitamins-Minerals (COMPLETE WOMENS PO) Take 1 capsule by mouth 2 (two) times daily.      . Omega-3 Fatty Acids (FISH OIL) 1000 MG CAPS Take 1 capsule by mouth.     No current facility-administered medications for this visit.  Allergies  Allergen Reactions  . Eggs Or Egg-Derived Products     Shortness of breath, wheezing    Social History   Socioeconomic History  . Marital status: Married    Spouse name: Not on file  . Number of children: 3  . Years of education: Not on file  . Highest education level: Not on file  Occupational History  . 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 Smoking status: Never Smoker  . Smokeless tobacco: Never Used  Substance and Sexual Activity  . Alcohol use: No  . Drug use: No  . Sexual activity: Not Currently  Other Topics Concern  . Not on file  Social History Narrative   3 biological children   1 adopted child   3 step-children    Household-- pt , husband and a son   Social Determinants of Radio broadcast assistant Strain:   . Difficulty of Paying Living Expenses: Not on file  Food Insecurity:   . Worried About Charity fundraiser in the Last Year: Not on file  . Ran Out of Food in the Last Year: Not on file  Transportation Needs:   . Lack of Transportation (Medical): Not on file  . Lack of Transportation (Non-Medical): Not on file  Physical Activity:   . Days of Exercise per Week: Not on file  . Minutes of Exercise per Session: Not on file  Stress:   . Feeling of Stress : Not on file  Social Connections:   . Frequency of Communication with Friends and Family: Not on file  . Frequency of Social Gatherings with Friends and Family: Not on file  . Attends Religious Services: Not on file  . Active Member of Clubs or Organizations: Not on file  . Attends Archivist Meetings: Not on file  . Marital Status: Not on file  Intimate Partner Violence:   . Fear of Current or Ex-Partner: Not on file  . Emotionally Abused: Not on file  . Physically Abused: Not on file  . Sexually Abused: Not on file     Review of Systems: General: No chills, fever, night sweats or weight changes  Cardiovascular:  No chest pain, dyspnea on exertion, edema, orthopnea, palpitations, paroxysmal nocturnal dyspnea Dermatological: No rash, lesions or masses Respiratory: No cough, dyspnea Urologic: No hematuria, dysuria Abdominal: No nausea, vomiting, diarrhea, bright red blood per rectum, melena, or hematemesis Neurologic: No visual changes, weakness, changes in mental status All other systems reviewed and are otherwise negative except as noted above.  Physical Exam: Vitals:   10/02/19 0947  BP: 118/76  Pulse: (!) 53  SpO2: 96%  Weight: 216 lb (98 kg)  Height: 5\' 5"  (1.651 m)    GEN- The patient is well appearing, alert and oriented x 3 today.   HEENT: normocephalic, atraumatic; sclera clear, conjunctiva pink;  hearing intact; oropharynx clear; neck supple, no JVP Lymph- no cervical lymphadenopathy Lungs- Clear to ausculation bilaterally, normal work of breathing.  No wheezes, rales, rhonchi Heart- Regular and slow rate and rhythm, no murmurs, rubs or gallops, PMI not laterally displaced GI- soft, non-tender, non-distended, bowel sounds present, no hepatosplenomegaly Extremities- no clubbing, cyanosis, or edema; DP/PT/radial pulses 2+ bilaterally MS- no significant deformity or atrophy Skin- warm and dry, no rash or lesion Psych- euthymic mood, full affect Neuro- strength and sensation are intact  EKG is ordered. Personal review of EKG from  today shows sinus bradycardia at 53 bpm  Additional studies reviewed include: Previous EP office notes. Pt AliveCor readings  Assessment and Plan: 1. Sinus bradycardia-chronic Stable. Asymptomatic at this time. Discussed alarm symptoms such as fatigue or near syncope associated with slow HRs. Pt knows to seek care if she has syncope (no history of) 2. PVCs Periods of bigeminy noted on AliveCor. Follow.  3. Hypertension Stable 4. Obesity Body mass index is 35.94 kg/m.  5. Sleep disordered breathing Have previously discussed weight loss and it's benefits for HTN and sleep disordered breathing.   Shirley Friar, PA-C  10/02/19 9:53 AM

## 2019-10-02 ENCOUNTER — Encounter: Payer: Self-pay | Admitting: Student

## 2019-10-02 ENCOUNTER — Ambulatory Visit: Payer: 59 | Admitting: Student

## 2019-10-02 ENCOUNTER — Other Ambulatory Visit: Payer: Self-pay

## 2019-10-02 VITALS — BP 118/76 | HR 53 | Ht 65.0 in | Wt 216.0 lb

## 2019-10-02 DIAGNOSIS — I493 Ventricular premature depolarization: Secondary | ICD-10-CM

## 2019-10-02 DIAGNOSIS — R001 Bradycardia, unspecified: Secondary | ICD-10-CM | POA: Diagnosis not present

## 2019-10-02 DIAGNOSIS — I10 Essential (primary) hypertension: Secondary | ICD-10-CM | POA: Diagnosis not present

## 2019-10-02 NOTE — Patient Instructions (Signed)
Medication Instructions:  *If you need a refill on your cardiac medications before your next appointment, please call your pharmacy*  Lab Work: If you have labs (blood work) drawn today and your tests are completely normal, you will receive your results only by: Marland Kitchen MyChart Message (if you have MyChart) OR . A paper copy in the mail If you have any lab test that is abnormal or we need to change your treatment, we will call you to review the results.  Follow-Up: At [facility], you and your health needs are our priority.  As part of our continuing mission to provide you with exceptional heart care, we have created designated Provider Care Teams.  These Care Teams include your primary Cardiologist (physician) and Advanced Practice Providers (APPs -  Physician Assistants and Nurse Practitioners) who all work together to provide you with the care you need, when you need it.  We recommend signing up for the patient portal called "MyChart".  Sign up information is provided on this After Visit Summary.  MyChart is used to connect with patients for Virtual Visits (Telemedicine).  Patients are able to view lab/test results, encounter notes, upcoming appointments, etc.  Non-urgent messages can be sent to your provider as well.   To learn more about what you can do with MyChart, go to [email or web address].    Your next appointment:   Your physician recommends that you have a follow-up appointment in 6 MONTHS with Dr. Caryl Comes --  Friday 03/13/20 at 9:30 am   The format for your next appointment:   In Person with Virl Axe, MD

## 2019-10-03 ENCOUNTER — Encounter: Payer: Self-pay | Admitting: Internal Medicine

## 2019-10-07 ENCOUNTER — Ambulatory Visit: Payer: 59 | Admitting: Family Medicine

## 2019-10-10 ENCOUNTER — Other Ambulatory Visit: Payer: Self-pay

## 2019-10-10 ENCOUNTER — Ambulatory Visit: Payer: 59 | Admitting: Medical

## 2019-10-10 VITALS — BP 139/59 | HR 50 | Temp 98.0°F | Resp 18 | Ht 65.0 in | Wt 219.8 lb

## 2019-10-10 DIAGNOSIS — S46812A Strain of other muscles, fascia and tendons at shoulder and upper arm level, left arm, initial encounter: Secondary | ICD-10-CM

## 2019-10-10 MED ORDER — MELOXICAM 15 MG PO TABS: 15.0000 mg | ORAL_TABLET | Freq: Every day | ORAL | 0 refills | Status: DC

## 2019-10-10 MED ORDER — CYCLOBENZAPRINE HCL 5 MG PO TABS: 5.0000 mg | ORAL_TABLET | Freq: Every day | ORAL | 0 refills | Status: DC

## 2019-10-10 MED FILL — CYCLOBENZAPRINE HCL 5 MG TA: 5 | 7 days supply | Qty: 7 | Fill #0

## 2019-10-10 MED FILL — MELOXICAM 15 MG TABLET: 15 | 10 days supply | Qty: 10 | Fill #0

## 2019-10-10 NOTE — Progress Notes (Addendum)
   Subjective:    Patient ID: Brenda Russo, female    DOB: 26-Jul-1955, 64 y.o.   MRN: [id number]  HPI  Pt has 10 days of upper back pain. Pain not present in morning but when she stands and leans over notes pain is worse. Pain worsens as day progresses discomfort worsens.   Pt has tried ibuprofen and meloxicam. Both helped a little bit.  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 Leaning over and typing all day.  Pt is not diabetic.     Review of Systems See hpi. Pain upper back. She points to left side neck.    Objective:   Physical Exam  General- No acute distress. Pleasant patient. Neck- Full range of motion, no jvd. No mid c spine tenderness. Left side trapezius tenderness on palpation. No neck stiffness.  Lungs- Clear, even and unlabored. Heart- regular rate and rhythm. Neurologic- CNII- XII grossly intact. Back- no mid spine tenderness        Assessment & Plan:  You do have trapezius pain on left side. Seems be related to head tilting forward seated at work typing.(this is possible).   Offered toradol 30 mg im. Declined.  Rx refill of meloxicam 15 mg for 7-10 days.  Rx flexeril 5 mg at night/hr of sleep.  Follow up 7-10 days or as needed. Pain persist refer to sports med.

## 2019-10-10 NOTE — Patient Instructions (Addendum)
You do have trapezius pain on left side. Seems be related to head tilting forward seated at work typing for hours on end.(this is possible).   Offered toradol 30 mg IM. Declined.  Rx refill of meloxicam 15 mg for 7-10 days.  Rx flexeril 5 mg at night/hr of sleep.  Follow up 7-10 days or as needed. Pain persist refer to sports med.

## 2019-11-07 IMAGING — DX DG CHEST 2V
2 series · 2 of 2 positions shown · non-contrast
Comparison: Chest radiograph 12/24/2015

CLINICAL DATA: Patient with congestion

EXAM:
CHEST - 2 VIEW

[chest pa]
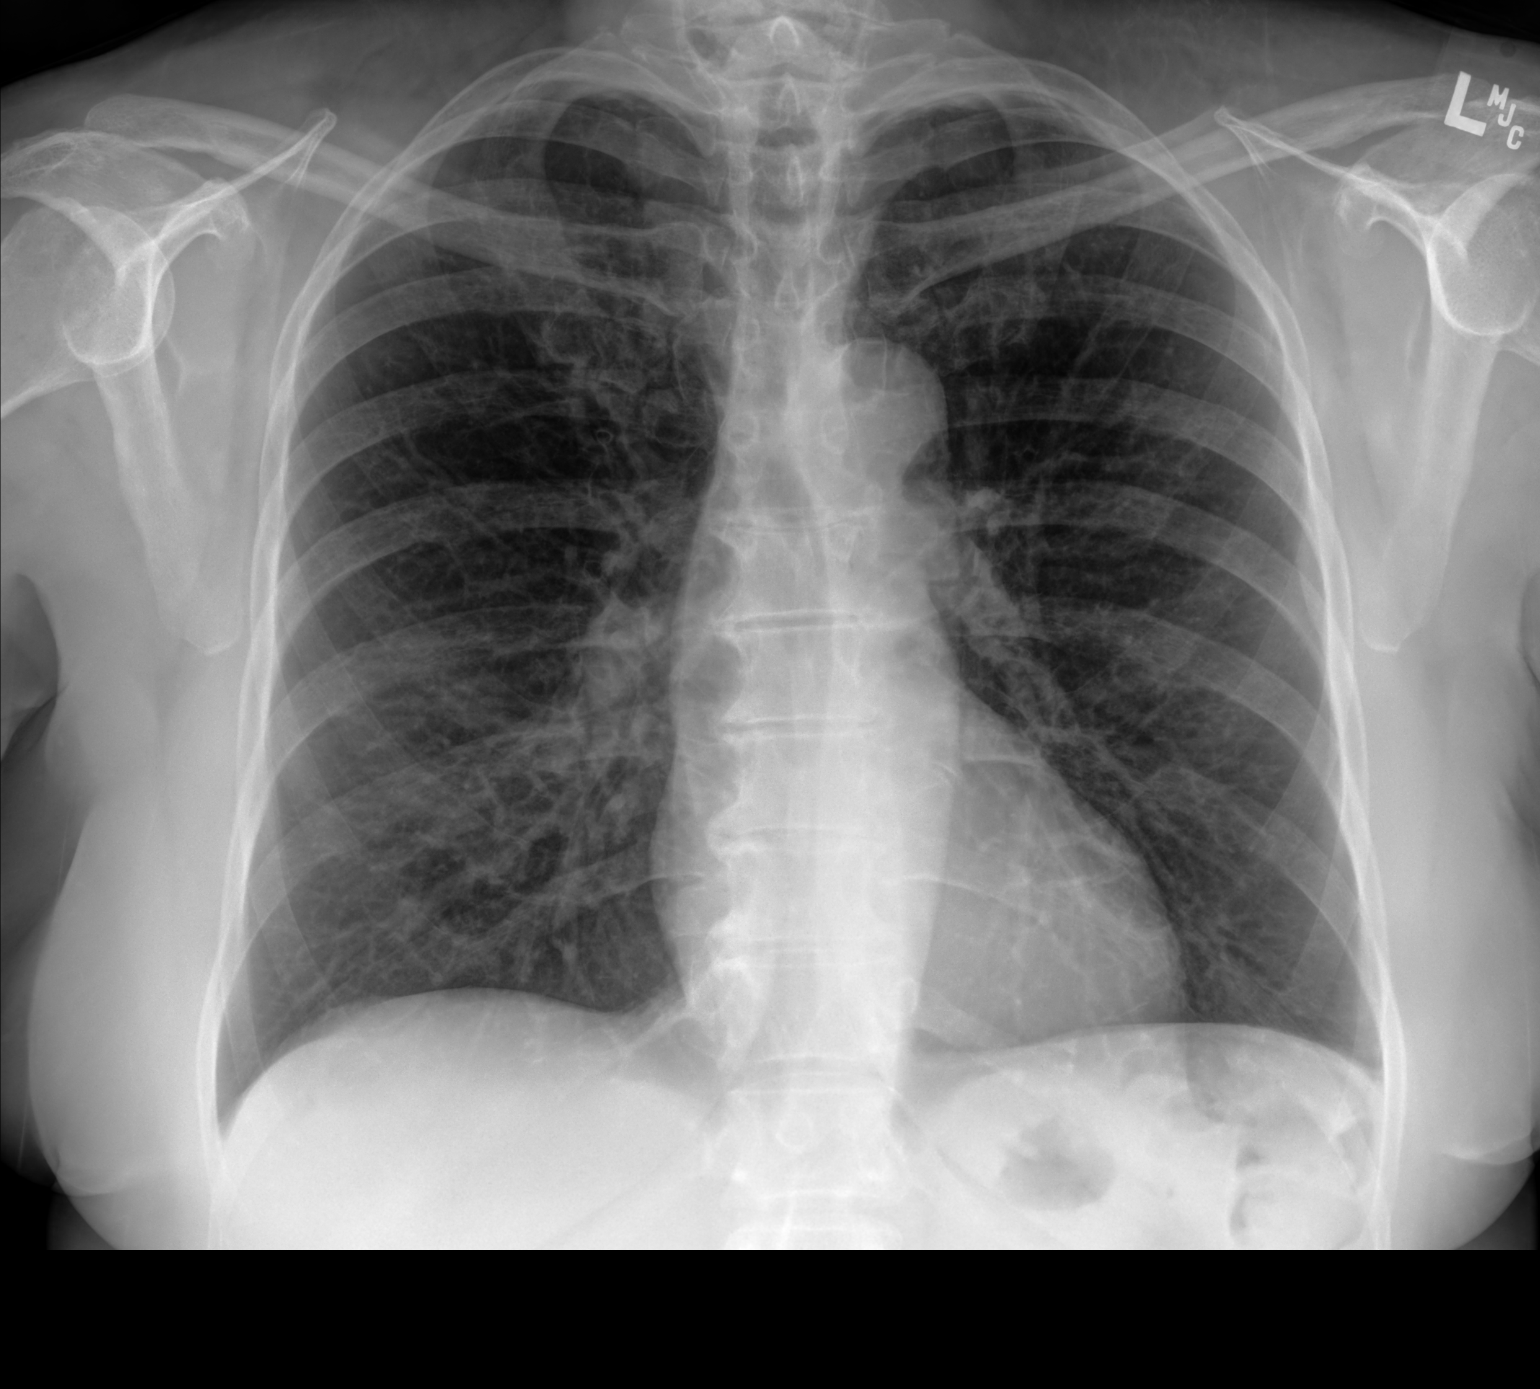

[chest lat]
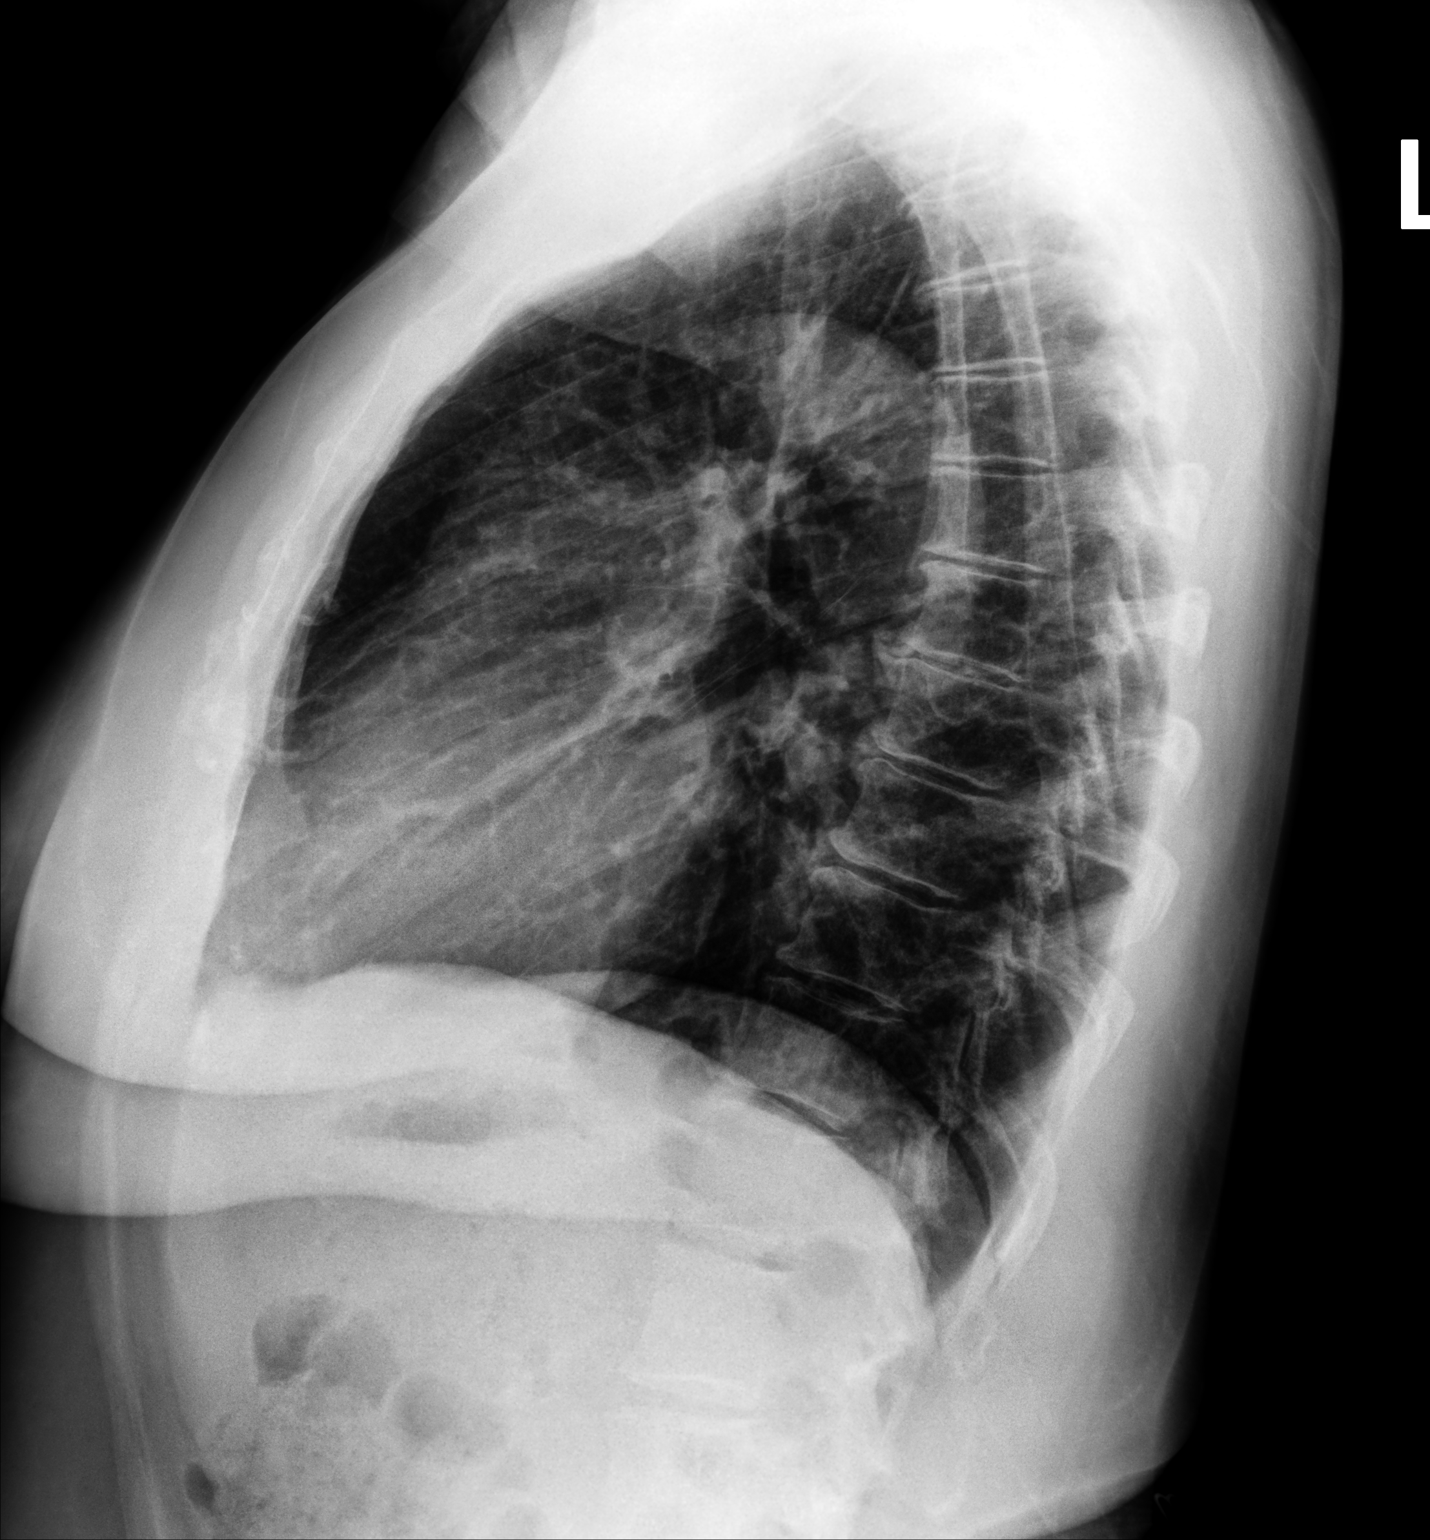

[2 of 2 positions shown; findings below may reference images not displayed]

FINDINGS: Normal cardiac and mediastinal contours. No consolidative pulmonary
opacities. No pleural effusion or pneumothorax. Thoracic spine
degenerative changes.
IMPRESSION: No acute cardiopulmonary process.

## 2019-11-07 MED FILL — BREO ELLIPTA 200-25 MCG INH: 200-25 | 30 days supply | Qty: 60 | Fill #2

## 2019-11-12 ENCOUNTER — Other Ambulatory Visit (HOSPITAL_BASED_OUTPATIENT_CLINIC_OR_DEPARTMENT_OTHER): Payer: Self-pay | Admitting: Internal Medicine

## 2019-11-12 MED FILL — FLUCELVAX QUADRIVALENT 0.5: 0.5 | 1 days supply | Qty: 1 | Fill #0

## 2019-11-14 DIAGNOSIS — Z1231 Encounter for screening mammogram for malignant neoplasm of breast: Secondary | ICD-10-CM | POA: Diagnosis not present

## 2019-11-14 LAB — HM MAMMOGRAPHY

## 2019-11-18 ENCOUNTER — Other Ambulatory Visit: Payer: Self-pay | Admitting: Internal Medicine

## 2019-11-18 ENCOUNTER — Encounter: Payer: Self-pay | Admitting: Internal Medicine

## 2019-11-18 ENCOUNTER — Other Ambulatory Visit: Payer: Self-pay

## 2019-11-18 ENCOUNTER — Ambulatory Visit: Payer: 59 | Admitting: Internal Medicine

## 2019-11-18 DIAGNOSIS — J4540 Moderate persistent asthma, uncomplicated: Secondary | ICD-10-CM

## 2019-11-18 MED ORDER — FLUTICASONE-SALMETEROL 250-50 MCG/DOSE IN AEPB: 1.0000 | INHALATION_SPRAY | Freq: Two times a day (BID) | RESPIRATORY_TRACT | 11 refills | Status: DC

## 2019-11-18 MED FILL — ADVAIR 250/50 DISKUS: 250-50 | 30 days supply | Qty: 60 | Fill #0

## 2019-11-18 NOTE — Patient Instructions (Signed)
wixella 250 one puff first thing in am then repeat in 12 hours   Please schedule a follow up visit in  6 months but call sooner if needed

## 2019-11-18 NOTE — Progress Notes (Signed)
Brenda Russo, female    DOB: 30-Mar-1955    MRN: [id number]   Brief patient profile:  64  yobf never smoker/ born prematurely but able to keep up with other kids  works but asthma as child and never outgrew prev under Dr Jeannine Kitten care and maintained on [place] 200 one daily with dx of moderate chronic asthma referred herself to me to establish for pulmonary f/u for asthma    History of Present Illness  11/18/2019  Pulmonary/ 1st office eval/Nichol Ator unhappy with cost of breo 200  Chief Complaint  Patient presents with  . Follow-up    Denies any problems with breathing  Dyspnea:  Not limited by breathing from desired activities  -no regular aerobics or steps Cough: none  Sleep: no problem at 10 degrees  SABA use: none  No obvious day to day or daytime variability or assoc excess/ purulent sputum or mucus plugs or hemoptysis or cp or chest tightness, subjective wheeze or overt sinus or hb symptoms.   Sleeping as above without nocturnal  or early am exacerbation  of respiratory  c/o's or need for noct saba. Also denies any obvious fluctuation of symptoms with weather or environmental changes or other aggravating or alleviating factors except as outlined above   No unusual exposure hx or h/o childhood pna  or knowledge of premature birth.  Current Allergies, Complete Past Medical History, Past Surgical History, Family History, and Social History were reviewed in [employer] record.  ROS  The following are not active complaints unless bolded Hoarseness, sore throat, dysphagia, dental problems, itching, sneezing,  nasal congestion or discharge of excess mucus or purulent secretions, ear ache,   fever, chills, sweats, unintended wt loss or wt gain, classically pleuritic or exertional cp,  orthopnea pnd or arm/hand swelling  or leg swelling, presyncope, palpitations, abdominal pain, anorexia, nausea, vomiting, diarrhea  or change in bowel habits or change in bladder habits,  change in stools or change in urine, dysuria, hematuria,  rash, arthralgias, visual complaints, headache, numbness, weakness or ataxia or problems with walking or coordination,  change in mood or  memory.           Past Medical History:  Diagnosis Date  . Allergic rhinitis   . Allergy   . Anemia   . Anxiety   . Asthma   . HTN (hypertension)   . Hyperlipidemia    borderline  . Obese   . OSA (obstructive sleep apnea)    mild ,no CPAP  . Overweight(278.02)   . Plantar wart    history  . Prediabetes   . Sleep apnea     Outpatient Medications Prior to Visit  Medication Sig Dispense Refill  . albuterol (VENTOLIN HFA) 108 (90 Base) MCG/ACT inhaler Inhale 1-2 puffs into the lungs every 4 (four) hours as needed for wheezing or shortness of breath. 18 g 5  . amLODipine (NORVASC) 5 MG tablet Take 1 tablet (5 mg total) by mouth daily. 90 tablet 1  . ciclopirox (PENLAC) 8 % solution Apply topically at bedtime. Apply over nail and surrounding skin. Apply daily over previous coat. After seven (7) days, may remove with alcohol and continue cycle. 6.6 mL 4  . Coenzyme Q10 (COQ10) 100 MG CAPS Take 1 capsule by mouth daily.    . cyclobenzaprine (FLEXERIL) 5 MG tablet Take 1 tablet (5 mg total) by mouth at bedtime. 7 tablet 0  . Ergocalciferol 2500 units CAPS Take 5,000 Units by mouth daily. [place]  capsule 0  . fluticasone (FLONASE) 50 MCG/ACT nasal spray Place 2 sprays into both nostrils daily. 16 g 5  . fluticasone furoate-vilanterol (BREO ELLIPTA) 200-25 MCG/INH AEPB Inhale 1 puff into the lungs in the morning and at bedtime. 180 each 0  . hydrochlorothiazide (HYDRODIURIL) 12.5 MG tablet Take 1 tablet (12.5 mg total) by mouth daily. 90 tablet 1  . losartan (COZAAR) 100 MG tablet Take 1 tablet (100 mg total) by mouth daily. 90 tablet 1  . magnesium oxide (MAG-OX) 400 MG tablet 2 tablets daily. 400-800 mg daily    . Multiple Minerals-Vitamins (CALCIUM CITRATE PLUS/MAGNESIUM) TABS Take 1 tablet by  mouth.    . Multiple Vitamins-Minerals (COMPLETE WOMENS PO) Take 1 capsule by mouth 2 (two) times daily.      . Omega-3 Fatty Acids (FISH OIL) 1000 MG CAPS Take 1 capsule by mouth.    . meloxicam (MOBIC) 15 MG tablet Take 1 tablet (15 mg total) by mouth daily. (Patient not taking: Reported on 11/18/2019) 10 tablet 0   No facility-administered medications prior to visit.     Objective:     BP 124/68 (BP Location: Left Arm, Cuff Size: Normal)   Pulse 70   Temp 98 F (36.7 C) (Oral)   Ht 5\' 5"  (1.651 m)   Wt 215 lb 3.2 oz (97.6 kg)   SpO2 99% Comment: RA  BMI 35.81 kg/m   SpO2: 99 % (RA)   Pleasant amb obese bf nad   HEENT : pt wearing mask not removed for exam due to covid -19 concerns.    NECK :  without JVD/Nodes/TM/ nl carotid upstrokes bilaterally   LUNGS: no acc muscle use,  Nl contour chest which is clear to A and P bilaterally without cough on insp or exp maneuvers   CV:  RRR  no s3 or murmur or increase in P2, and no edema   ABD:  soft and nontender with nl inspiratory excursion in the supine position. No bruits or organomegaly appreciated, bowel sounds nl  MS:  Nl gait/ ext warm without deformities, calf tenderness, cyanosis or clubbing No obvious joint restrictions   SKIN: warm and dry without lesions    NEURO:  alert, approp, nl sensorium with  no motor or cerebellar deficits apparent.       Assessment   Asthma, chronic, moderate persistent, uncomplicated Never smoker - Spirometry  12/24/15 FEV1 1.7 (76%)  Ratio 0.61 with classic concavity unknown whether p meds or not    She may have remodeling at this point an no active asthma so does not really need highdose ICS and may do just as well on a cheaper alternative - try wixella 250 bid and refer to pharmacist for consult if this is not affordable.  - The proper method of use, as well as anticipated side effects, of a metered-dose inhaler and dry powder inhaler  were discussed and demonstrated to the  patient using teach back method.             Each maintenance medication was reviewed in detail including emphasizing most importantly the difference between maintenance and prns and under what circumstances the prns are to be triggered using an action plan format where appropriate.  Total time for H and P, chart review, counseling, teaching device and generating customized AVS unique to this office visit / charting =  20 min           Christinia Gully, MD 11/18/2019

## 2019-11-19 NOTE — Assessment & Plan Note (Signed)
Never smoker - Spirometry  12/24/15 FEV1 1.7 (76%)  Ratio 0.61 with classic concavity unknown whether p meds or not    She may have remodeling at this point an no active asthma so does not really need highdose ICS and may do just as well on a cheaper alternative - try wixella 250 bid and refer to pharmacist for consult if this is not affordable.  - The proper method of use, as well as anticipated side effects, of a metered-dose inhaler and dry powder inhaler  were discussed and demonstrated to the patient using teach back method.             Each maintenance medication was reviewed in detail including emphasizing most importantly the difference between maintenance and prns and under what circumstances the prns are to be triggered using an action plan format where appropriate.  Total time for H and P, chart review, counseling, teaching device and generating customized AVS unique to this office visit / charting =  20 min

## 2019-12-18 ENCOUNTER — Encounter: Payer: Self-pay | Admitting: Internal Medicine

## 2019-12-18 ENCOUNTER — Ambulatory Visit: Payer: 59 | Admitting: Internal Medicine

## 2019-12-18 ENCOUNTER — Other Ambulatory Visit: Payer: Self-pay

## 2019-12-18 ENCOUNTER — Ambulatory Visit (HOSPITAL_BASED_OUTPATIENT_CLINIC_OR_DEPARTMENT_OTHER)
Admission: RE | Admit: 2019-12-18 | Discharge: 2019-12-18 | Disposition: A | Discharge status: Home / Self Care | Payer: 59 | Source: Ambulatory Visit | Attending: Internal Medicine | Admitting: Internal Medicine

## 2019-12-18 VITALS — BP 118/75 | HR 67 | Temp 98.2°F | Resp 16 | Ht 65.0 in | Wt 217.1 lb

## 2019-12-18 DIAGNOSIS — R1310 Dysphagia, unspecified: Secondary | ICD-10-CM | POA: Insufficient documentation

## 2019-12-18 NOTE — Progress Notes (Signed)
Subjective:    Patient ID: Brenda Russo, female    DOB: April 05, 1955, 64 y.o.   MRN: [id number]  DOS:  12/18/2019 Type of visit - description: Acute  Had dysphagia for 2 days in a row, symptoms ended 2 days ago.  Reports when she swalled solids or fluids she had dysphagia along with pain at the left side of the chest with some radiation to the back, she felt somewhat chocked but did not cough.  Has not taking any NSAIDs recently. Does not recall symptoms beingy triggered by a pill or a piece of meat. Symptoms  resolved and now she is completely asymptomatic  Wt Readings from Last 3 Encounters:  12/18/19 217 lb 2 oz (98.5 kg)  11/18/19 215 lb 3.2 oz (97.6 kg)  10/10/19 219 lb 12.8 oz (99.7 kg)     Review of Systems No recent fever chills or weight loss No nausea, vomiting, diarrhea or blood in the stools. No abdominal pain No heartburn per se anytime recently  Past Medical History:  Diagnosis Date  . Allergic rhinitis   . Allergy   . Anemia   . Anxiety   . Asthma   . HTN (hypertension)   . Hyperlipidemia    borderline  . Obese   . OSA (obstructive sleep apnea)    mild ,no CPAP  . Overweight(278.02)   . Plantar wart    history  . Prediabetes   . Sleep apnea     Past Surgical History:  Procedure Laterality Date  . BREAST BIOPSY Right 09/02/2015  . CESAREAN SECTION    . COLONOSCOPY  11/05/2018  . COLONOSCOPY  2010  . TUBAL LIGATION      Allergies as of 12/18/2019      Reactions   Eggs Or Egg-derived Products    Shortness of breath, wheezing      Medication List       Accurate as of December 18, 2019 10:28 AM. If you have any questions, ask your nurse or doctor.        albuterol 108 (90 Base) MCG/ACT inhaler Commonly known as: VENTOLIN HFA Inhale 1-2 puffs into the lungs every 4 (four) hours as needed for wheezing or shortness of breath.   amLODipine 5 MG tablet Commonly known as: NORVASC Take 1 tablet (5 mg total) by mouth daily.    Calcium Citrate Plus/Magnesium Tabs Take 1 tablet by mouth.   ciclopirox 8 % solution Commonly known as: Penlac Apply topically at bedtime. Apply over nail and surrounding skin. Apply daily over previous coat. After seven (7) days, may remove with alcohol and continue cycle.   COMPLETE WOMENS PO Take 1 capsule by mouth 2 (two) times daily.   CoQ10 100 MG Caps Take 1 capsule by mouth daily.   cyclobenzaprine 5 MG tablet Commonly known as: FLEXERIL Take 1 tablet (5 mg total) by mouth at bedtime.   Ergocalciferol 62.5 MCG (2500 UT) Caps Take 5,000 Units by mouth daily.   Fish Oil 1000 MG Caps Take 1 capsule by mouth.   fluticasone 50 MCG/ACT nasal spray Commonly known as: FLONASE Place 2 sprays into both nostrils daily.   Fluticasone-Salmeterol 250-50 MCG/DOSE Aepb Commonly known as: Wixela Inhub Inhale 1 puff into the lungs in the morning and at bedtime.   hydrochlorothiazide 12.5 MG tablet Commonly known as: HYDRODIURIL Take 1 tablet (12.5 mg total) by mouth daily.   losartan 100 MG tablet Commonly known as: COZAAR Take 1 tablet (100 mg total) by mouth daily.  magnesium oxide 400 MG tablet Commonly known as: MAG-OX 2 tablets daily. 400-800 mg daily   meloxicam 15 MG tablet Commonly known as: MOBIC Take 1 tablet (15 mg total) by mouth daily.          Objective:   Physical Exam BP 118/75 (BP Location: Left Arm, Patient Position: Sitting, Cuff Size: Normal)   Pulse 67   Temp 98.2 F (36.8 C) (Oral)   Resp 16   Ht 5\' 5"  (1.651 m)   Wt 217 lb 2 oz (98.5 kg)   SpO2 98%   BMI 36.13 kg/m  General:   Well developed, NAD, BMI noted.  HEENT:  Normocephalic . Face symmetric, atraumatic Neck: No thyromegaly, no lymphadenopathies, supraclavicular areas free of abnormalities Lungs:  CTA B Normal respiratory effort, no intercostal retractions, no accessory muscle use. Heart: RRR,  no murmur.  Abdomen:  Not distended, soft, non-tender. No rebound or rigidity.    Skin: Not pale. Not jaundice Lower extremities: no pretibial edema bilaterally  Neurologic:  alert & oriented X3.  Speech normal, gait appropriate for age and unassisted Psych--  Cognition and judgment appear intact.  Cooperative with normal attention span and concentration.  Behavior appropriate. No anxious or depressed appearing.     Assessment     Assessment Prediabetes A1c 6.0 (05/2014) HTN Sinus bradycardia-PVCs: Saw cards 04/26/2017. Saw Dr Caryl Comes  02/2019 Hyperlipidemia Anxiety Obesity Asthma - h/o intubation x2 in the 90s  OSA-mild, dx 2014 , never tried CPAP BTL LLQ abd pain on-off , last GI visit 11-2015, CT abd (-)  PLAN: Dysphagia: As described above, transit x 2 days, now asymptomatic, no history of chronic GERD symptoms, no red flag symptoms.  Not on PPIs. Transient esophagitis from a pill? Options chest x-ray and observation vs GI referral. We agreed to chest x-ray and observation, if symptoms resurface will need a referral for sideration of EGD. Sinus bradycardia, PVCs: Saw cardiology 10/02/2019, no change meds  This visit occurred during the SARS-CoV-2 public health emergency.  Safety protocols were in place, including screening questions prior to the visit, additional usage of staff PPE, and extensive cleaning of exam room while observing appropriate contact time as indicated for disinfecting solutions.

## 2019-12-18 NOTE — Progress Notes (Signed)
Pre visit review using our clinic review tool, if applicable. No additional management support is needed unless otherwise documented below in the visit note. 

## 2019-12-18 NOTE — Patient Instructions (Signed)
Please proceed with a chest x-ray downstairs  If the symptoms resurface at any time please let me know, we will refer you to our gastroenterologist.

## 2019-12-19 NOTE — Assessment & Plan Note (Signed)
Dysphagia: As described above, transit x 2 days, now asymptomatic, no history of chronic GERD symptoms, no red flag symptoms.  Not on PPIs. Transient esophagitis from a pill? Options chest x-ray and observation vs GI referral. We agreed to chest x-ray and observation, if symptoms resurface will need a referral for sideration of EGD. Sinus bradycardia, PVCs: Saw cardiology 10/02/2019, no change meds

## 2019-12-20 ENCOUNTER — Other Ambulatory Visit (INDEPENDENT_AMBULATORY_CARE_PROVIDER_SITE_OTHER): Payer: Self-pay | Admitting: Family Medicine

## 2019-12-20 DIAGNOSIS — J069 Acute upper respiratory infection, unspecified: Secondary | ICD-10-CM

## 2019-12-20 MED FILL — ADVAIR 250/50 DISKUS: 250-50 | 30 days supply | Qty: 60 | Fill #1

## 2019-12-23 ENCOUNTER — Other Ambulatory Visit: Payer: Self-pay | Admitting: Internal Medicine

## 2019-12-23 DIAGNOSIS — J069 Acute upper respiratory infection, unspecified: Secondary | ICD-10-CM

## 2019-12-23 MED FILL — HYDROCHLOROTHIAZIDE 12.5 MG: 12.5 | 90 days supply | Qty: 90 | Fill #1

## 2019-12-23 MED FILL — AMLODIPINE BESYLATE 5 MG TA: 5 | 90 days supply | Qty: 90 | Fill #1

## 2019-12-23 MED FILL — LOSARTAN POTASSIUM 100 MG T: 100 | 90 days supply | Qty: 90 | Fill #1

## 2019-12-23 MED FILL — FLUTICASONE PROP 50 MCG SPR: 50 | 90 days supply | Qty: 48 | Fill #0

## 2020-01-16 MED FILL — ADVAIR 250/50 DISKUS: 250-50 | 30 days supply | Qty: 60 | Fill #2

## 2020-01-27 ENCOUNTER — Other Ambulatory Visit (HOSPITAL_COMMUNITY)
Admission: RE | Admit: 2020-01-27 | Discharge: 2020-01-27 | Disposition: A | Discharge status: Home / Self Care | Payer: 59 | Source: Ambulatory Visit | Attending: Obstetrics & Gynecology | Admitting: Obstetrics & Gynecology

## 2020-01-27 ENCOUNTER — Other Ambulatory Visit: Payer: Self-pay

## 2020-01-27 ENCOUNTER — Ambulatory Visit (INDEPENDENT_AMBULATORY_CARE_PROVIDER_SITE_OTHER): Payer: 59 | Admitting: Obstetrics & Gynecology

## 2020-01-27 ENCOUNTER — Encounter: Payer: Self-pay | Admitting: Obstetrics & Gynecology

## 2020-01-27 VITALS — BP 139/83 | HR 69 | Ht 65.0 in | Wt 218.0 lb

## 2020-01-27 DIAGNOSIS — Z01419 Encounter for gynecological examination (general) (routine) without abnormal findings: Secondary | ICD-10-CM | POA: Diagnosis not present

## 2020-01-27 DIAGNOSIS — Z780 Asymptomatic menopausal state: Secondary | ICD-10-CM | POA: Diagnosis not present

## 2020-01-27 NOTE — Progress Notes (Signed)
Subjective:     Brenda Russo is a 64 y.o. female here for a routine exam. LMP >10 years orev  Current complaints: none. Pt reports that aside from being overweight she feels great. She is walking but, has not done any strength training. She has her BMD 1 year prev and her colonoscopy in 2020. She had her last GYN exam with Dr. Garwin Brothers 2 years prev.     Gynecologic History No LMP recorded. Patient is postmenopausal. Contraception: post menopausal status Last Pap: 2 years prev.  Results were: WNL per pt Last mammogram: 11/14/2019. Results were: normal  Obstetric History OB History  Gravida Para Term Preterm AB Living  3 3 3     3   SAB IAB Ectopic Multiple Live Births          3    # Outcome Date GA Lbr Len/2nd Weight Sex Delivery Anes PTL Lv  3 Term 1980 [redacted]w[redacted]d   F CS-LTranv Spinal N LIV  2 Term 1978 [redacted]w[redacted]d   F CS-LTranv EPI N LIV  1 Term 38 [redacted]w[redacted]d   M Vag-Spont None N LIV     The following portions of the patient's history were reviewed and updated as appropriate: allergies, current medications, past family history, past medical history, past social history, past surgical history and problem list.  Review of Systems Pertinent items are noted in HPI.    Objective:  BP 139/83    Pulse 69    Ht 5\' 5"  (1.651 m)    Wt 218 lb (98.9 kg)    BMI 36.28 kg/m      General Appearance:    Alert, cooperative, no distress, appears stated age  Head:    Normocephalic, without obvious abnormality, atraumatic  Eyes:    conjunctiva/corneas clear, EOM's intact, both eyes  Ears:    Normal external ear canals, both ears  Nose:   Nares normal, septum midline, mucosa normal, no drainage    or sinus tenderness  Throat:   Lips, mucosa, and tongue normal; teeth and gums normal  Neck:   Supple, symmetrical, trachea midline, no adenopathy;    thyroid:  no enlargement/tenderness/nodules  Back:     Symmetric, no curvature, ROM normal, no CVA tenderness  Lungs:     respirations unlabored  Chest Wall:     No tenderness or deformity   Heart:    Regular rate and rhythm  Breast Exam:    No tenderness, masses, or nipple abnormality  Abdomen:     Soft, non-tender, bowel sounds active all four quadrants,    no masses, no organomegaly  Genitalia:    Normal female without lesion, discharge or tenderness     Extremities:   Extremities normal, atraumatic, no cyanosis or edema  Pulses:   2+ and symmetric all extremities  Skin:   Skin color, texture, turgor normal, no rashes or lesions    Assessment:    Healthy female exam.   Weight management- recommended strength training. Pt will look up videos online.   Post menopausal state   Plan:   Brenda Russo was seen today for gynecologic exam.  Diagnoses and all orders for this visit:  Well female exam with routine gynecological exam -     Cytology - PAP( )  Post-menopausal  f/u in 1 year or sooner prn   Allyn Bartelson L. Harraway-Smith, M.D., [name]

## 2020-01-28 LAB — CYTOLOGY - PAP
Comment: NEGATIVE
Diagnosis: NEGATIVE
High risk HPV: NEGATIVE

## 2020-02-06 ENCOUNTER — Ambulatory Visit: Payer: 59 | Admitting: Internal Medicine

## 2020-02-06 ENCOUNTER — Other Ambulatory Visit: Payer: Self-pay

## 2020-02-06 ENCOUNTER — Encounter: Payer: Self-pay | Admitting: Internal Medicine

## 2020-02-06 VITALS — BP 147/90 | HR 58 | Temp 97.9°F | Resp 16 | Ht 65.0 in | Wt 221.4 lb

## 2020-02-06 DIAGNOSIS — I498 Other specified cardiac arrhythmias: Secondary | ICD-10-CM | POA: Diagnosis not present

## 2020-02-06 DIAGNOSIS — R1310 Dysphagia, unspecified: Secondary | ICD-10-CM | POA: Diagnosis not present

## 2020-02-06 DIAGNOSIS — I10 Essential (primary) hypertension: Secondary | ICD-10-CM

## 2020-02-06 NOTE — Progress Notes (Signed)
Pre visit review using our clinic review tool, if applicable. No additional management support is needed unless otherwise documented below in the visit note. 

## 2020-02-06 NOTE — Progress Notes (Signed)
Subjective:    Patient ID: Brenda Russo, female    DOB: 01/19/1956, 64 y.o.   MRN: [id number]  DOS:  02/06/2020 Type of visit - description: Follow-up Since the last office visit he is doing well. She complained of dysphagia at the time, symptoms resolved. BP today slightly elevated, normal at home. Respiratory symptoms controlled with Advair. Hardly ever uses her rescue inhaler.   Review of Systems See above   Past Medical History:  Diagnosis Date  . Allergic rhinitis   . Allergy   . Anemia   . Anxiety   . Asthma   . HTN (hypertension)   . Hyperlipidemia    borderline  . Obese   . OSA (obstructive sleep apnea)    mild ,no CPAP  . Overweight(278.02)   . Plantar wart    history  . Prediabetes   . Sleep apnea     Past Surgical History:  Procedure Laterality Date  . BREAST BIOPSY Right 09/02/2015  . CESAREAN SECTION    . COLONOSCOPY  11/05/2018  . COLONOSCOPY  2010  . TUBAL LIGATION      Allergies as of 02/06/2020      Reactions   Eggs Or Egg-derived Products    Shortness of breath, wheezing      Medication List       Accurate as of February 06, 2020 11:59 PM. If you have any questions, ask your nurse or doctor.        STOP taking these medications   ciclopirox 8 % solution Commonly known as: Penlac Stopped by: Kathlene November, MD   fluticasone 50 MCG/ACT nasal spray Commonly known as: FLONASE Stopped by: Kathlene November, MD     TAKE these medications   albuterol 108 (90 Base) MCG/ACT inhaler Commonly known as: VENTOLIN HFA Inhale 1-2 puffs into the lungs every 4 (four) hours as needed for wheezing or shortness of breath.   amLODipine 5 MG tablet Commonly known as: NORVASC Take 1 tablet (5 mg total) by mouth daily.   Calcium Citrate Plus/Magnesium Tabs Take 1 tablet by mouth.   COMPLETE WOMENS PO Take 1 capsule by mouth 2 (two) times daily.   CoQ10 100 MG Caps Take 1 capsule by mouth daily.   Ergocalciferol 62.5 MCG (2500 UT) Caps Take  5,000 Units by mouth daily.   Fish Oil 1000 MG Caps Take 1 capsule by mouth.   Fluticasone-Salmeterol 250-50 MCG/DOSE Aepb Commonly known as: Wixela Inhub Inhale 1 puff into the lungs in the morning and at bedtime.   hydrochlorothiazide 12.5 MG tablet Commonly known as: HYDRODIURIL Take 1 tablet (12.5 mg total) by mouth daily.   losartan 100 MG tablet Commonly known as: COZAAR Take 1 tablet (100 mg total) by mouth daily.   magnesium oxide 400 MG tablet Commonly known as: MAG-OX 2 tablets daily. 400-800 mg daily          Objective:   Physical Exam BP (!) 147/90 (BP Location: Left Arm, Patient Position: Sitting, Cuff Size: Normal)   Pulse (!) 58   Temp 97.9 F (36.6 C) (Oral)   Resp 16   Ht 5\' 5"  (1.651 m)   Wt 221 lb 6 oz (100.4 kg)   SpO2 95%   BMI 36.84 kg/m  General:   Well developed, NAD, BMI noted. HEENT:  Normocephalic . Face symmetric, atraumatic Lungs:  CTA B Normal respiratory effort, no intercostal retractions, no accessory muscle use. Heart: RRR,  no murmur.  Lower extremities: no pretibial edema bilaterally  Skin: Not pale. Not jaundice Neurologic:  alert & oriented X3.  Speech normal, gait appropriate for age and unassisted Psych--  Cognition and judgment appear intact.  Cooperative with normal attention span and concentration.  Behavior appropriate. No anxious or depressed appearing.      Assessment    Assessment Prediabetes A1c 6.0 (05/2014) HTN Sinus bradycardia-PVCs: Saw cards 04/26/2017. Saw Dr Graciela Husbands  02/2019 Hyperlipidemia Anxiety Obesity Asthma - h/o intubation x2 in the 90s  OSA-mild, dx 2014 , never tried CPAP BTL LLQ abd pain on-off , last GI visit 11-2015, CT abd (-)  PLAN: Dysphagia: See last visit, chest x-ray negative, no further symptoms.  No further evaluation at this point Sinus bradycardia, PVCs.  Needs a referral to see cardiology due to insurance rules, referral sent. HTN: Good compliance with amlodipine, HCTZ,  losartan.  BP today slightly elevated, normal at home, no change, last BMP satisfactory. Preventive care: Encouraged to get COVID vaccine booster.  She is somewhat hesitant. RTC 07/2020 CPX    This visit occurred during the SARS-CoV-2 public health emergency.  Safety protocols were in place, including screening questions prior to the visit, additional usage of staff PPE, and extensive cleaning of exam room while observing appropriate contact time as indicated for disinfecting solutions.

## 2020-02-06 NOTE — Patient Instructions (Signed)
Check the  blood pressure regulalrly BP GOAL is between 110/65 and  135/85. If it is consistently higher or lower, let me know  Get your covid booster    GO TO THE FRONT DESK, PLEASE SCHEDULE YOUR APPOINTMENTS Come back for  A physical exam by June 2022

## 2020-02-08 NOTE — Assessment & Plan Note (Signed)
Dysphagia: See last visit, chest x-ray negative, no further symptoms.  No further evaluation at this point Sinus bradycardia, PVCs.  Needs a referral to see cardiology due to insurance rules, referral sent. HTN: Good compliance with amlodipine, HCTZ, losartan.  BP today slightly elevated, normal at home, no change, last BMP satisfactory. Preventive care: Encouraged to get COVID vaccine booster.  She is somewhat hesitant. RTC 07/2020 CPX

## 2020-02-26 MED FILL — ADVAIR 250/50 DISKUS: 250-50 | 30 days supply | Qty: 60 | Fill #3

## 2020-03-13 ENCOUNTER — Ambulatory Visit: Payer: 59 | Admitting: Internal Medicine

## 2020-03-31 ENCOUNTER — Other Ambulatory Visit: Payer: Self-pay | Admitting: Internal Medicine

## 2020-03-31 MED FILL — HYDROCHLOROTHIAZIDE 12.5 MG: 12.5 | 90 days supply | Qty: 90 | Fill #0

## 2020-03-31 MED FILL — LOSARTAN POTASSIUM 100 MG T: 100 | 90 days supply | Qty: 90 | Fill #0

## 2020-03-31 MED FILL — ADVAIR 250/50 DISKUS: 250-50 | 30 days supply | Qty: 60 | Fill #4

## 2020-03-31 MED FILL — AMLODIPINE BESYLATE 5 MG TA: 5 | 90 days supply | Qty: 90 | Fill #0

## 2020-04-05 ENCOUNTER — Encounter: Payer: Self-pay | Admitting: Internal Medicine

## 2020-04-05 DIAGNOSIS — J4540 Moderate persistent asthma, uncomplicated: Secondary | ICD-10-CM

## 2020-04-05 DIAGNOSIS — I498 Other specified cardiac arrhythmias: Secondary | ICD-10-CM

## 2020-04-07 ENCOUNTER — Other Ambulatory Visit: Payer: Self-pay

## 2020-04-07 ENCOUNTER — Encounter: Payer: Self-pay | Admitting: Internal Medicine

## 2020-04-07 ENCOUNTER — Ambulatory Visit (INDEPENDENT_AMBULATORY_CARE_PROVIDER_SITE_OTHER): Payer: No Typology Code available for payment source | Admitting: Internal Medicine

## 2020-04-07 VITALS — BP 152/90 | HR 58 | Ht 65.0 in | Wt 216.0 lb

## 2020-04-07 DIAGNOSIS — R001 Bradycardia, unspecified: Secondary | ICD-10-CM

## 2020-04-07 DIAGNOSIS — I493 Ventricular premature depolarization: Secondary | ICD-10-CM | POA: Diagnosis not present

## 2020-04-07 NOTE — Progress Notes (Signed)
Patient Care Team: Colon Branch, MD as PCP - General (Internal Medicine) Deboraha Sprang, MD as PCP - Electrophysiology (Cardiology) Servando Salina, MD as Consulting Physician (Obstetrics and Gynecology) Noralee Space, MD as Consulting Physician (Pulmonary Disease) Starlyn Skeans, MD as Consulting Physician [facility])   HPI  Brenda Russo is a 65 y.o. female Seen in follow-up for PVCs associated with pseudobradycardia  Doing great with scant palps, and no chest pain, shortness of breath, nocturnal dyspnea, orthopnea or peripheral edema.  There have been no lightheadedness or syncope.    DATE TEST EF   12/20 Echo   60-65 %           Records and Results Reviewed   Past Medical History:  Diagnosis Date  . Allergic rhinitis   . Allergy   . Anemia   . Anxiety   . Asthma   . HTN (hypertension)   . Hyperlipidemia    borderline  . Obese   . OSA (obstructive sleep apnea)    mild ,no CPAP  . Overweight(278.02)   . Plantar wart    history  . Prediabetes   . Sleep apnea     Past Surgical History:  Procedure Laterality Date  . BREAST BIOPSY Right 09/02/2015  . CESAREAN SECTION    . COLONOSCOPY  11/05/2018  . COLONOSCOPY  2010  . TUBAL LIGATION      Current Meds  Medication Sig  . albuterol (VENTOLIN HFA) 108 (90 Base) MCG/ACT inhaler Inhale 1-2 puffs into the lungs every 4 (four) hours as needed for wheezing or shortness of breath.  Marland Kitchen amLODipine (NORVASC) 5 MG tablet Take 1 tablet (5 mg total) by mouth daily.  . Coenzyme Q10 (COQ10) 100 MG CAPS Take 1 capsule by mouth daily.  . Ergocalciferol 2500 units CAPS Take 5,000 Units by mouth daily.  . Fluticasone-Salmeterol (WIXELA INHUB) 250-50 MCG/DOSE AEPB Inhale 1 puff into the lungs in the morning and at bedtime.  . hydrochlorothiazide (HYDRODIURIL) 12.5 MG tablet Take 1 tablet (12.5 mg total) by mouth daily.  Marland Kitchen losartan (COZAAR) 100 MG tablet Take 1 tablet (100 mg total) by mouth daily.  .  magnesium oxide (MAG-OX) 400 MG tablet 2 tablets daily. 400-800 mg daily  . Multiple Minerals-Vitamins (CALCIUM CITRATE PLUS/MAGNESIUM) TABS Take 1 tablet by mouth.  . Multiple Vitamins-Minerals (COMPLETE WOMENS PO) Take 1 capsule by mouth 2 (two) times daily.  . Omega-3 Fatty Acids (FISH OIL) 1000 MG CAPS Take 1 capsule by mouth.    Allergies  Allergen Reactions  . Eggs Or Egg-Derived Products     Shortness of breath, wheezing      Review of Systems negative except from HPI and PMH  Physical Exam BP (!) 152/90   Pulse (!) 58   Ht 5\' 5"  (1.651 m)   Wt 216 lb (98 kg)   SpO2 96%   BMI 35.94 kg/m  Well developed and well nourished in no acute distress HENT normal E scleral and icterus clear Neck Supple JVP flat; carotids brisk and full Clear to ausculation Regular rate and rhythm, no murmurs gallops or rub Soft with active bowel sounds No clubbing cyanosis  Edema Alert and oriented, grossly normal motor and sensory function Skin Warm and Dry  ECG sinus @ 58 18/08/44  CrCl cannot be calculated (Patient's most recent lab result is older than the maximum 21 days allowed.).   Assessment and  Plan  Sinus bradycardia-chronic  PVCs  Hypertension  Obesity  Sleep disordered breathing   Asymptomatic ectopy   Recently EF was normal so no evidence of PVC assoc CM  BP elevated here but she says normal at home     Current medicines are reviewed at length with the patient today .  The patient does not  have concerns regarding medicines.

## 2020-04-07 NOTE — Patient Instructions (Signed)
Medication Instructions:  Your physician recommends that you continue on your current medications as directed. Please refer to the Current Medication list given to you today.  *If you need a refill on your cardiac medications before your next appointment, please call your pharmacy*   Lab Work: None ordered.  If you have labs (blood work) drawn today and your tests are completely normal, you will receive your results only by: MyChart Message (if you have MyChart) OR A paper copy in the mail If you have any lab test that is abnormal or we need to change your treatment, we will call you to review the results.   Testing/Procedures: None ordered.    Follow-Up: At [facility], you and your health needs are our priority.  As part of our continuing mission to provide you with exceptional heart care, we have created designated Provider Care Teams.  These Care Teams include your primary Cardiologist (physician) and Advanced Practice Providers (APPs -  Physician Assistants and Nurse Practitioners) who all work together to provide you with the care you need, when you need it.  We recommend signing up for the patient portal called "MyChart".  Sign up information is provided on this After Visit Summary.  MyChart is used to connect with patients for Virtual Visits (Telemedicine).  Patients are able to view lab/test results, encounter notes, upcoming appointments, etc.  Non-urgent messages can be sent to your provider as well.   To learn more about what you can do with MyChart, go to [email or web address].    Your next appointment:   2 year(s)  The format for your next appointment:   In Person  Provider:   Steven Klein, MD 

## 2020-04-16 ENCOUNTER — Other Ambulatory Visit (HOSPITAL_BASED_OUTPATIENT_CLINIC_OR_DEPARTMENT_OTHER): Payer: Self-pay | Admitting: Oral Surgery

## 2020-04-16 MED FILL — AMOXICILLIN 500 MG CAPSULE: 500 | 7 days supply | Qty: 21 | Fill #0

## 2020-04-16 MED FILL — HYDROCODON-APAP 5-325: 5-325 | 4 days supply | Qty: 20 | Fill #0

## 2020-04-16 MED FILL — CHLORHEXIDINE 0.12% RINSE: 0.12 | 16 days supply | Qty: 473 | Fill #0

## 2020-04-30 ENCOUNTER — Other Ambulatory Visit (HOSPITAL_COMMUNITY): Payer: Self-pay | Admitting: Pharmacist

## 2020-04-30 MED FILL — CARESTART COVID-19 HOME TES: 2 days supply | Qty: 2 | Fill #0

## 2020-05-07 MED FILL — ADVAIR 250/50 DISKUS: 250-50 | 30 days supply | Qty: 60 | Fill #5

## 2020-05-07 MED FILL — CHLORHEXIDINE 0.12% RINSE: 0.12 | 16 days supply | Qty: 473 | Fill #0

## 2020-05-18 ENCOUNTER — Ambulatory Visit: Payer: 59 | Admitting: Internal Medicine

## 2020-06-04 ENCOUNTER — Other Ambulatory Visit (HOSPITAL_BASED_OUTPATIENT_CLINIC_OR_DEPARTMENT_OTHER): Payer: Self-pay

## 2020-06-04 MED FILL — Chlorhexidine Gluconate Soln 0.12%: OROMUCOSAL | 30 days supply | Qty: 473 | Fill #0 | Status: AC

## 2020-06-15 ENCOUNTER — Other Ambulatory Visit (HOSPITAL_BASED_OUTPATIENT_CLINIC_OR_DEPARTMENT_OTHER): Payer: Self-pay

## 2020-06-15 ENCOUNTER — Encounter: Payer: Self-pay | Admitting: Internal Medicine

## 2020-06-15 ENCOUNTER — Other Ambulatory Visit: Payer: Self-pay

## 2020-06-15 ENCOUNTER — Ambulatory Visit (INDEPENDENT_AMBULATORY_CARE_PROVIDER_SITE_OTHER): Payer: No Typology Code available for payment source | Admitting: Internal Medicine

## 2020-06-15 DIAGNOSIS — J4540 Moderate persistent asthma, uncomplicated: Secondary | ICD-10-CM

## 2020-06-15 MED FILL — Hydrochlorothiazide Tab 12.5 MG: ORAL | 90 days supply | Qty: 90 | Fill #0 | Status: AC

## 2020-06-15 MED FILL — Amlodipine Besylate Tab 5 MG (Base Equivalent): ORAL | 90 days supply | Qty: 90 | Fill #0 | Status: AC

## 2020-06-15 MED FILL — Losartan Potassium Tab 100 MG: ORAL | 90 days supply | Qty: 90 | Fill #0 | Status: AC

## 2020-06-15 NOTE — Progress Notes (Signed)
Brenda Russo, female    DOB: 1955-06-13    MRN: [id number]   Brief patient profile:  29  yobf never smoker/ born prematurely but able to keep up with other kids  works but asthma as child and never outgrew prev under Dr Jeannine Kitten care and maintained on [place] 200 one daily with dx of moderate chronic asthma referred herself to me to establish for pulmonary f/u for asthma    History of Present Illness  11/18/2019  Pulmonary/ 1st office eval/Brenda Russo unhappy with cost of breo 200  Chief Complaint  Patient presents with  . Follow-up    Denies any problems with breathing  Dyspnea:  Not limited by breathing from desired activities  -no regular aerobics or steps Cough: none  Sleep: no problem at 10 degrees  SABA use: none wixella 250 one puff first thing in am then repeat in 12 hours    06/15/2020  f/u ov/Brenda Russo re: mod asthma prefers once dail breo 200 but does fine on advair 250 bid when remembers to take 2nd dose and can't afford the breo on her plan Chief Complaint  Patient presents with  . Follow-up    Breathing is overall doing well. She rarely uses her albuterol inhaler. She is currently on Breo 200 bc she had some left over and prefers inhaler that she only has to use once per day. Ins covers Advair.   Dyspnea:  No regular ex  Cough: none  Sleeping: 10 degrees ok  SABA use: rarely  02: none  Covid status:   vax x 2 p 2nd was sept 2021    No obvious day to day or daytime variability or assoc excess/ purulent sputum or mucus plugs or hemoptysis or cp or chest tightness, subjective wheeze or overt sinus or hb symptoms.   Sleeping as above  without nocturnal  or early am exacerbation  of respiratory  c/o's or need for noct saba. Also denies any obvious fluctuation of symptoms with weather or environmental changes or other aggravating or alleviating factors except as outlined above   No unusual exposure hx or h/o childhood pna/ asthma or knowledge of premature birth.  Current  Allergies, Complete Past Medical History, Past Surgical History, Family History, and Social History were reviewed in [employer] record.  ROS  The following are not active complaints unless bolded Hoarseness, sore throat, dysphagia, dental problems, itching, sneezing,  nasal congestion or discharge of excess mucus or purulent secretions, ear ache,   fever, chills, sweats, unintended wt loss or wt gain, classically pleuritic or exertional cp,  orthopnea pnd or arm/hand swelling  or leg swelling, presyncope, palpitations, abdominal pain, anorexia, nausea, vomiting, diarrhea  or change in bowel habits or change in bladder habits, change in stools or change in urine, dysuria, hematuria,  rash, arthralgias, visual complaints, headache, numbness, weakness or ataxia or problems with walking or coordination,  change in mood or  memory.        Current Meds  Medication Sig  . albuterol (VENTOLIN HFA) 108 (90 Base) MCG/ACT inhaler Inhale 1-2 puffs into the lungs every 4 (four) hours as needed for wheezing or shortness of breath.  Marland Kitchen amLODipine (NORVASC) 5 MG tablet TAKE 1 TABLET (5 MG TOTAL) BY MOUTH DAILY.  Marland Kitchen Coenzyme Q10 (COQ10) 100 MG CAPS Take 1 capsule by mouth daily.  . Ergocalciferol 2500 units CAPS Take 5,000 Units by mouth daily.  . fluticasone furoate-vilanterol (BREO ELLIPTA) 200-25 MCG/INH AEPB Inhale 1 puff into the lungs  daily.  . hydrochlorothiazide (HYDRODIURIL) 12.5 MG tablet TAKE 1 TABLET (12.5 MG TOTAL) BY MOUTH DAILY.  . influenza vaccine (FLUCELVAX - EGG FREE) 0.5 ML injection INJECT AS DIRECTED  . losartan (COZAAR) 100 MG tablet TAKE 1 TABLET (100 MG TOTAL) BY MOUTH DAILY.  . magnesium oxide (MAG-OX) 400 MG tablet 2 tablets daily. 400-800 mg daily  . Multiple Minerals-Vitamins (CALCIUM CITRATE PLUS/MAGNESIUM) TABS Take 1 tablet by mouth.  . Multiple Vitamins-Minerals (COMPLETE WOMENS PO) Take 1 capsule by mouth 2 (two) times daily.  . Omega-3 Fatty Acids (FISH OIL)  1000 MG CAPS Take 1 capsule by mouth.           Past Medical History:  Diagnosis Date  . Allergic rhinitis   . Allergy   . Anemia   . Anxiety   . Asthma   . HTN (hypertension)   . Hyperlipidemia    borderline  . Obese   . OSA (obstructive sleep apnea)    mild ,no CPAP  . Overweight(278.02)   . Plantar wart    history  . Prediabetes   . Sleep apnea         Objective:    Wt Readings from Last 3 Encounters:  06/15/20 218 lb (98.9 kg)  04/07/20 216 lb (98 kg)  02/06/20 221 lb 6 oz (100.4 kg)      Vital signs reviewed  06/15/2020  - Note at rest 02 sats  96% on RA   General appearance:    amb pleasant female nad   HEENT : pt wearing mask not removed for exam due to covid -19 concerns.    NECK :  without JVD/Nodes/TM/ nl carotid upstrokes bilaterally   LUNGS: no acc muscle use,  Nl contour chest which is clear to A and P bilaterally without cough on insp or exp maneuvers   CV:  RRR  no s3 or murmur or increase in P2, and no edema   ABD:  soft and nontender with nl inspiratory excursion in the supine position. No bruits or organomegaly appreciated, bowel sounds nl  MS:  Nl gait/ ext warm without deformities, calf tenderness, cyanosis or clubbing No obvious joint restrictions   SKIN: warm and dry without lesions    NEURO:  alert, approp, nl sensorium with  no motor or cerebellar deficits apparent.             Assessment

## 2020-06-15 NOTE — Patient Instructions (Signed)
No change in your medications   To get the most out of exercise, you need to be continuously aware that you are short of breath, but never out of breath, for at least 30 minutes daily. As you improve, it will actually be easier for you to do the same amount of exercise  in  30 minutes so always push to the level where you are short of breath.       Please schedule a follow up visit in 12  months but call sooner if needed

## 2020-06-16 ENCOUNTER — Encounter: Payer: Self-pay | Admitting: Internal Medicine

## 2020-06-16 NOTE — Assessment & Plan Note (Signed)
Never smoker - Spirometry  12/24/15 FEV1 1.7 (76%)  Ratio 0.61 with classic concavity unknown whether p meds or not     Clinically:  All goals of chronic asthma control met including optimal function and elimination of symptoms with minimal need for rescue therapy.  Contingencies discussed in full including contacting this office immediately if not controlling the symptoms using the rule of two's.     Discussed: Advised:  formulary restrictions will be an ongoing challenge for the forseable future and I would be happy to pick an alternative if the pt will first  provide me a list of them -  pt  will need to return here for training for any new device that is required eg dpi vs hfa vs respimat.    In the meantime we can always provide samples so that the patient never runs out of any needed respiratory medications.    F/u yearly - call sooner if needed          Each maintenance medication was reviewed in detail including emphasizing most importantly the difference between maintenance and prns and under what circumstances the prns are to be triggered using an action plan format where appropriate.  Total time for H and P, chart review, counseling, reviewing dpi device(s) and generating customized AVS unique to this office visit / same day charting = 66mn

## 2020-07-16 ENCOUNTER — Encounter: Payer: Self-pay | Admitting: Internal Medicine

## 2020-07-16 DIAGNOSIS — B351 Tinea unguium: Secondary | ICD-10-CM

## 2020-08-04 ENCOUNTER — Other Ambulatory Visit: Payer: Self-pay | Admitting: Internal Medicine

## 2020-08-04 ENCOUNTER — Other Ambulatory Visit (HOSPITAL_BASED_OUTPATIENT_CLINIC_OR_DEPARTMENT_OTHER): Payer: Self-pay

## 2020-08-04 MED ORDER — FLUTICASONE-SALMETEROL 250-50 MCG/ACT IN AEPB
INHALATION_SPRAY | RESPIRATORY_TRACT | 11 refills | Status: DC
  Filled 2020-08-04: qty 60, 30d supply, fill #0
  Filled 2020-09-24: qty 60, 30d supply, fill #1
  Filled 2020-11-09: qty 60, 30d supply, fill #2
  Filled 2020-12-21: qty 60, 30d supply, fill #3
  Filled 2021-01-18: qty 60, 30d supply, fill #4
  Filled 2021-03-02: qty 60, 30d supply, fill #5
  Filled 2021-05-03: qty 60, 30d supply, fill #6
  Filled 2021-07-02: qty 60, 30d supply, fill #7
  Filled 2021-08-03: qty 60, 30d supply, fill #8

## 2020-08-06 ENCOUNTER — Ambulatory Visit (INDEPENDENT_AMBULATORY_CARE_PROVIDER_SITE_OTHER): Payer: No Typology Code available for payment source | Admitting: Internal Medicine

## 2020-08-06 ENCOUNTER — Encounter: Payer: Self-pay | Admitting: Internal Medicine

## 2020-08-06 ENCOUNTER — Other Ambulatory Visit: Payer: Self-pay

## 2020-08-06 VITALS — BP 132/80 | HR 58 | Temp 98.1°F | Resp 16 | Ht 65.0 in | Wt 213.5 lb

## 2020-08-06 DIAGNOSIS — I10 Essential (primary) hypertension: Secondary | ICD-10-CM | POA: Diagnosis not present

## 2020-08-06 DIAGNOSIS — Z0000 Encounter for general adult medical examination without abnormal findings: Secondary | ICD-10-CM | POA: Diagnosis not present

## 2020-08-06 DIAGNOSIS — E785 Hyperlipidemia, unspecified: Secondary | ICD-10-CM

## 2020-08-06 LAB — COMPREHENSIVE METABOLIC PANEL
ALT: 10 U/L (ref 0–35)
AST: 15 U/L (ref 0–37)
Albumin: 4 g/dL (ref 3.5–5.2)
Alkaline Phosphatase: 102 U/L (ref 39–117)
BUN: 18 mg/dL (ref 6–23)
CO2: 30 mEq/L (ref 19–32)
Calcium: 9.3 mg/dL (ref 8.4–10.5)
Chloride: 105 mEq/L (ref 96–112)
Creatinine, Ser: 0.76 mg/dL (ref 0.40–1.20)
GFR: 83.22 mL/min (ref >60.00)
Glucose, Bld: 108 mg/dL — ABNORMAL HIGH (ref 70–99)
Potassium: 4 mEq/L (ref 3.5–5.1)
Sodium: 141 mEq/L (ref 135–145)
Total Bilirubin: 0.9 mg/dL (ref 0.2–1.2)
Total Protein: 7.3 g/dL (ref 6.0–8.3)

## 2020-08-06 LAB — CBC WITH DIFFERENTIAL/PLATELET
Basophils Absolute: 0 10*3/uL (ref 0.0–0.1)
Basophils Relative: 0.7 % (ref 0.0–3.0)
Eosinophils Absolute: 0.2 10*3/uL (ref 0.0–0.7)
Eosinophils Relative: 3 % (ref 0.0–5.0)
HCT: 44.2 % (ref 36.0–46.0)
Hemoglobin: 14.3 g/dL (ref 12.0–15.0)
Lymphocytes Relative: 29.9 % (ref 12.0–46.0)
Lymphs Abs: 1.9 10*3/uL (ref 0.7–4.0)
MCHC: 32.3 g/dL (ref 30.0–36.0)
MCV: 87 fl (ref 78.0–100.0)
Monocytes Absolute: 0.4 10*3/uL (ref 0.1–1.0)
Monocytes Relative: 5.9 % (ref 3.0–12.0)
Neutro Abs: 3.9 10*3/uL (ref 1.4–7.7)
Neutrophils Relative %: 60.5 % (ref 43.0–77.0)
Platelets: 275 10*3/uL (ref 150.0–400.0)
RBC: 5.08 Mil/uL (ref 3.87–5.11)
RDW: 14.7 % (ref 11.5–15.5)
WBC: 6.4 10*3/uL (ref 4.0–10.5)

## 2020-08-06 LAB — LIPID PANEL
Cholesterol: 188 mg/dL (ref 0–200)
HDL: 59.5 mg/dL (ref >39.00)
LDL Cholesterol: 119 mg/dL — ABNORMAL HIGH (ref 0–99)
NonHDL: 128.67
Total CHOL/HDL Ratio: 3
Triglycerides: 50 mg/dL (ref 0.0–149.0)
VLDL: 10 mg/dL (ref 0.0–40.0)

## 2020-08-06 LAB — HEMOGLOBIN A1C: Hgb A1c MFr Bld: 6.1 % (ref 4.6–6.5)

## 2020-08-06 NOTE — Patient Instructions (Signed)
   GO TO THE LAB : Get the blood work     GO TO THE FRONT DESK, PLEASE SCHEDULE YOUR APPOINTMENTS Come back for a physical exam in 1 year 

## 2020-08-06 NOTE — Progress Notes (Signed)
Subjective:    Patient ID: Brenda Russo, female    DOB: [birth year], 65 y.o.   MRN: [id number]  DOS:  08/06/2020 Type of visit - description: CPX  Here for CPX, feels well. Did report few episodes of pain at the R side of the abdomen few weeks ago, largely  resolved. There was no associated rash, nausea, vomiting. No fever chills.  No dysuria or gross hematuria. Symptoms got better after she move around.   Wt Readings from Last 3 Encounters:  08/06/20 213 lb 8 oz (96.8 kg)  06/15/20 218 lb (98.9 kg)  04/07/20 216 lb (98 kg)     Review of Systems  Other than above, a 14 point review of systems is negative     Past Medical History:  Diagnosis Date   Allergic rhinitis    Allergy    Anemia    Anxiety    Asthma    HTN (hypertension)    Hyperlipidemia    borderline   Obese    OSA (obstructive sleep apnea)    mild ,no CPAP   Overweight(278.02)    Plantar wart    history   Prediabetes    Sleep apnea     Past Surgical History:  Procedure Laterality Date   BREAST BIOPSY Right 09/02/2015   CESAREAN SECTION     COLONOSCOPY  11/05/2018   COLONOSCOPY  2010   TUBAL LIGATION     Social History   Socioeconomic History   Marital status: Married    Spouse name: Not on file   Number of children: 3   Years of education: Not on file   Highest education level: Not on file  Occupational History   Occupation: Retail banker at [employer]    Employer: [employer]: first shift  Tobacco Use   Smoking status: Never   Smokeless tobacco: Never  Substance and Sexual Activity   Alcohol use: No   Drug use: No   Sexual activity: Yes  Other Topics Concern   Not on file  Social History Narrative   3 biological children   1 adopted child   3 step-children   Household-- pt , husband and a son   Social Determinants of Radio broadcast assistant Strain: Not on file  Food Insecurity: Not on file  Transportation Needs: Not on file  Physical Activity: Not on  file  Stress: Not on file  Social Connections: Not on file  Intimate Partner Violence: Not on file    Allergies as of 08/06/2020       Reactions   Eggs Or Egg-derived Products    Shortness of breath, wheezing        Medication List        Accurate as of August 06, 2020 11:59 PM. If you have any questions, ask your nurse or doctor.          STOP taking these medications    Breo Ellipta 200-25 MCG/INH Aepb Generic drug: fluticasone furoate-vilanterol Stopped by: Kathlene November, MD   Flucelvax Quadrivalent 0.5 ML injection Generic drug: influenza vaccine Stopped by: Kathlene November, MD   fluticasone 50 MCG/ACT nasal spray Commonly known as: FLONASE Stopped by: Kathlene November, MD       TAKE these medications    Advair Diskus 250-50 MCG/ACT Aepb Generic drug: fluticasone-salmeterol INHALE 1 PUFF BY MOUTH INTO THE LUNGS IN THE MORNING AND AT BEDTIME.   albuterol 108 (90 Base) MCG/ACT inhaler Commonly known as: VENTOLIN  HFA Inhale 1-2 puffs into the lungs every 4 (four) hours as needed for wheezing or shortness of breath.   amLODipine 5 MG tablet Commonly known as: NORVASC TAKE 1 TABLET (5 MG TOTAL) BY MOUTH DAILY.   Calcium Citrate Plus/Magnesium Tabs Take 1 tablet by mouth.   COMPLETE WOMENS PO Take 1 capsule by mouth 2 (two) times daily.   CoQ10 100 MG Caps Take 1 capsule by mouth daily.   Ergocalciferol 62.5 MCG (2500 UT) Caps Take 5,000 Units by mouth daily.   Fish Oil 1000 MG Caps Take 1 capsule by mouth.   hydrochlorothiazide 12.5 MG tablet Commonly known as: HYDRODIURIL TAKE 1 TABLET (12.5 MG TOTAL) BY MOUTH DAILY.   losartan 100 MG tablet Commonly known as: COZAAR TAKE 1 TABLET (100 MG TOTAL) BY MOUTH DAILY.   magnesium oxide 400 MG tablet Commonly known as: MAG-OX 2 tablets daily. 400-800 mg daily           Objective:   Physical Exam BP 132/80 (BP Location: Left Arm, Patient Position: Sitting, Cuff Size: Normal)   Pulse (!) 58   Temp 98.1 F  (36.7 C) (Oral)   Resp 16   Ht 5\' 5"  (1.651 m)   Wt 213 lb 8 oz (96.8 kg)   SpO2 98%   BMI 35.53 kg/m  General: Well developed, NAD, BMI noted Neck: No  thyromegaly  HEENT:  Normocephalic . Face symmetric, atraumatic Lungs:  CTA B Normal respiratory effort, no intercostal retractions, no accessory muscle use. Heart: Bradycardic. Abdomen:  Not distended, soft, non-tender. No rebound or rigidity.   Lower extremities: no pretibial edema bilaterally  Skin: Exposed areas without rash. Not pale. Not jaundice Neurologic:  alert & oriented X3.  Speech normal, gait appropriate for age and unassisted Strength symmetric and appropriate for age.  Psych: Cognition and judgment appear intact.  Cooperative with normal attention span and concentration.  Behavior appropriate. No anxious or depressed appearing.     Assessment     Assessment Prediabetes A1c 6.0 (05/2014) HTN Sinus bradycardia-PVCs:    Hyperlipidemia Anxiety Obesity Asthma - h/o intubation x2 in the 90s  OSA-mild, dx 2014 , never tried CPAP BTL LLQ abd pain on-off , last GI visit 11-2015, CT abd (-)  PLAN: Here for CPX Prediabetes: Check A1c.  Doing well with diet and exercise, has lost some weight HTN: BP is very good, continue amlodipine, HCTZ, losartan.  Labs Sinus bradycardia, PVCs: Saw cards recently, next visit 2 years, sooner if sxs Obesity: Doing well with diet and exercise. Asthma: Well-controlled.  Saw pulmonology 06/15/2020. Social: Thinking about retirement. RTC 1 year    This visit occurred during the SARS-CoV-2 public health emergency.  Safety protocols were in place, including screening questions prior to the visit, additional usage of staff PPE, and extensive cleaning of exam room while observing appropriate contact time as indicated for disinfecting solutions.

## 2020-08-08 ENCOUNTER — Encounter: Payer: Self-pay | Admitting: Internal Medicine

## 2020-08-08 NOTE — Assessment & Plan Note (Signed)
--  Tdap ,pnm shot, shingrex,: Declined, pro>cons d/w pt - She does get flu shots @ work    - Animal nutritionist x2, recommend booster. --Female care: per gyn, PAP 04/2018 per [facility]; [facility] 11-2019  --CCS--08/19/08- normal, C-scope 10/2018, polyps, next per GI (Dr Ardis Hughs) --dexa 07-2015 WNL , plans to do another DEXA w/ next [facility] --diet and exercise discussed today, doing very good. --labs reviewed: CMP, FLP, CBC, A1c

## 2020-08-08 NOTE — Assessment & Plan Note (Addendum)
Here for CPX Prediabetes: Check A1c.  Doing well with diet and exercise, has lost some weight HTN: BP is very good, continue amlodipine, HCTZ, losartan.  Labs Sinus bradycardia, PVCs: Saw cards recently, next visit 2 years, sooner if sxs Obesity: Doing well with diet and exercise. Asthma: Well-controlled.  Saw pulmonology 06/15/2020. Social: Thinking about retirement. RTC 1 year

## 2020-08-12 NOTE — Addendum Note (Signed)
Addended byDamita Dunnings D on: 08/12/2020 02:31 PM   Modules accepted: Orders

## 2020-08-24 ENCOUNTER — Other Ambulatory Visit: Payer: Self-pay

## 2020-08-24 ENCOUNTER — Other Ambulatory Visit (HOSPITAL_COMMUNITY): Payer: Self-pay

## 2020-08-24 ENCOUNTER — Encounter: Payer: Self-pay | Admitting: Podiatry

## 2020-08-24 ENCOUNTER — Ambulatory Visit (INDEPENDENT_AMBULATORY_CARE_PROVIDER_SITE_OTHER): Payer: No Typology Code available for payment source | Admitting: Podiatry

## 2020-08-24 DIAGNOSIS — B351 Tinea unguium: Secondary | ICD-10-CM | POA: Diagnosis not present

## 2020-08-24 DIAGNOSIS — Q828 Other specified congenital malformations of skin: Secondary | ICD-10-CM | POA: Diagnosis not present

## 2020-08-24 MED ORDER — CICLOPIROX 8 % EX SOLN
Freq: Every day | CUTANEOUS | 0 refills | Status: DC
  Filled 2020-08-24: qty 6.6, 30d supply, fill #0

## 2020-08-24 MED ORDER — UREA 40 % EX CREA
1.0000 "application " | TOPICAL_CREAM | Freq: Every day | CUTANEOUS | 0 refills | Status: DC
  Filled 2020-08-24: qty 85, 30d supply, fill #0
  Filled 2020-08-24: qty 85, fill #0

## 2020-08-25 ENCOUNTER — Other Ambulatory Visit (HOSPITAL_COMMUNITY): Payer: Self-pay

## 2020-08-26 ENCOUNTER — Other Ambulatory Visit (HOSPITAL_COMMUNITY): Payer: Self-pay

## 2020-08-31 NOTE — Progress Notes (Signed)
Subjective: 65 year old female presents the office today for evaluation of nail fungus.  She was previously using the Penlac which is helpful but it has dried up.  Also she had some callus spots on the bottom of her feet which did go away but it started to come back.  No open sores that she reports. Denies any systemic complaints such as fevers, chills, nausea, vomiting. No acute changes since last appointment, and no other complaints at this time.   Objective: AAO x3, NAD DP/PT pulses palpable bilaterally, CRT less than 3 seconds Nails to appear to be hypertrophic, dystrophic.  There is more dark/brown discoloration.  No hyperpigmented changes in the surrounding skin.  The pigment changes appear to be localized in the toenail itself.  Hyperkeratotic tissue.  No underlying ulcerations or signs of infection. No pain with calf compression, swelling, warmth, erythema  Assessment: Onychomycosis, hyperkeratotic lesions  Plan: -All treatment options discussed with the patient including all alternatives, risks, complications.  -As a courtesy debride the nails x10 without any complications or bleeding.  Refill Penlac. -Urea for the calluses -Patient encouraged to call the office with any questions, concerns, change in symptoms.   Trula Slade DPM

## 2020-09-24 ENCOUNTER — Other Ambulatory Visit (HOSPITAL_BASED_OUTPATIENT_CLINIC_OR_DEPARTMENT_OTHER): Payer: Self-pay

## 2020-09-24 ENCOUNTER — Other Ambulatory Visit: Payer: Self-pay | Admitting: Internal Medicine

## 2020-09-25 ENCOUNTER — Other Ambulatory Visit (HOSPITAL_BASED_OUTPATIENT_CLINIC_OR_DEPARTMENT_OTHER): Payer: Self-pay

## 2020-09-25 MED ORDER — HYDROCHLOROTHIAZIDE 12.5 MG PO TABS
ORAL_TABLET | Freq: Every day | ORAL | 2 refills | Status: DC
  Filled 2020-09-25: qty 90, 90d supply, fill #0
  Filled 2020-12-21: qty 90, 90d supply, fill #1
  Filled 2021-03-02 – 2021-05-03 (×2): qty 90, 90d supply, fill #2

## 2020-09-25 MED ORDER — LOSARTAN POTASSIUM 100 MG PO TABS
ORAL_TABLET | Freq: Every day | ORAL | 2 refills | Status: DC
  Filled 2020-09-25: qty 90, 90d supply, fill #0
  Filled 2020-12-21: qty 90, 90d supply, fill #1
  Filled 2021-03-02 – 2021-05-03 (×2): qty 90, 90d supply, fill #2

## 2020-09-25 MED ORDER — AMLODIPINE BESYLATE 5 MG PO TABS
ORAL_TABLET | Freq: Every day | ORAL | 2 refills | Status: DC
  Filled 2020-09-25: qty 90, 90d supply, fill #0
  Filled 2020-12-21: qty 90, 90d supply, fill #1
  Filled 2021-03-02 – 2021-05-03 (×2): qty 90, 90d supply, fill #2

## 2020-11-02 ENCOUNTER — Encounter: Payer: Self-pay | Admitting: Internal Medicine

## 2020-11-02 DIAGNOSIS — Z780 Asymptomatic menopausal state: Secondary | ICD-10-CM

## 2020-11-02 DIAGNOSIS — Z1231 Encounter for screening mammogram for malignant neoplasm of breast: Secondary | ICD-10-CM

## 2020-11-09 ENCOUNTER — Other Ambulatory Visit (HOSPITAL_BASED_OUTPATIENT_CLINIC_OR_DEPARTMENT_OTHER): Payer: Self-pay

## 2020-11-11 ENCOUNTER — Encounter: Payer: Self-pay | Admitting: Internal Medicine

## 2020-11-16 ENCOUNTER — Ambulatory Visit (HOSPITAL_BASED_OUTPATIENT_CLINIC_OR_DEPARTMENT_OTHER)
Admission: RE | Admit: 2020-11-16 | Discharge: 2020-11-16 | Disposition: A | Discharge status: Home / Self Care | Payer: No Typology Code available for payment source | Source: Ambulatory Visit | Attending: Internal Medicine | Admitting: Internal Medicine

## 2020-11-16 ENCOUNTER — Encounter (HOSPITAL_BASED_OUTPATIENT_CLINIC_OR_DEPARTMENT_OTHER): Payer: Self-pay

## 2020-11-16 ENCOUNTER — Other Ambulatory Visit: Payer: Self-pay

## 2020-11-16 DIAGNOSIS — Z780 Asymptomatic menopausal state: Secondary | ICD-10-CM | POA: Insufficient documentation

## 2020-11-16 DIAGNOSIS — Z1231 Encounter for screening mammogram for malignant neoplasm of breast: Secondary | ICD-10-CM | POA: Diagnosis present

## 2020-11-30 ENCOUNTER — Ambulatory Visit (INDEPENDENT_AMBULATORY_CARE_PROVIDER_SITE_OTHER): Payer: No Typology Code available for payment source | Admitting: Podiatry

## 2020-11-30 ENCOUNTER — Ambulatory Visit (HOSPITAL_BASED_OUTPATIENT_CLINIC_OR_DEPARTMENT_OTHER): Payer: No Typology Code available for payment source

## 2020-11-30 ENCOUNTER — Other Ambulatory Visit: Payer: Self-pay

## 2020-11-30 ENCOUNTER — Other Ambulatory Visit (HOSPITAL_COMMUNITY): Payer: Self-pay

## 2020-11-30 DIAGNOSIS — B351 Tinea unguium: Secondary | ICD-10-CM

## 2020-11-30 MED ORDER — CICLOPIROX 8 % EX SOLN
Freq: Every day | CUTANEOUS | 2 refills | Status: DC
Start: 2020-11-30 — End: 2022-07-05
  Filled 2020-11-30: qty 6.6, 30d supply, fill #0
  Filled 2021-01-19: qty 6.6, 30d supply, fill #1
  Filled 2021-05-03: qty 6.6, 30d supply, fill #2

## 2020-11-30 NOTE — Patient Instructions (Signed)
Ciclopirox nail solution What is this medication? CICLOPIROX (sye kloe PEER ox) NAIL SOLUTION is an antifungal medicine. It used to treat fungal infections of the nails. This medicine may be used for other purposes; ask your health care provider or pharmacist if you have questions. COMMON BRAND NAME(S): Ciclodan, CNL8, Penlac What should I tell my care team before I take this medication? They need to know if you have any of these conditions: diabetes mellitus history of seizures HIV infection immune system problems or organ transplant large areas of burned or damaged skin peripheral vascular disease or poor circulation taking corticosteroid medication (including steroid inhalers, cream, or lotion) an unusual or allergic reaction to ciclopirox, isopropyl alcohol, other medicines, foods, dyes, or preservatives pregnant or trying to get pregnant breast-feeding How should I use this medication? This medicine is for external use only. Follow the directions that come with this medicine exactly. Wash and dry your hands before use. Avoid contact with the eyes, mouth or nose. If you do get this medicine in your eyes, rinse out with plenty of cool tap water. Contact your doctor or health care professional if eye irritation occurs. Use at regular intervals. Do not use your medicine more often than directed. Finish the full course prescribed by your doctor or health care professional even if you think you are better. Do not stop using except on your doctor's advice. Talk to your pediatrician regarding the use of this medicine in children. While this medicine may be prescribed for children as young as 12 years for selected conditions, precautions do apply. Overdosage: If you think you have taken too much of this medicine contact a poison control center or emergency room at once. NOTE: This medicine is only for you. Do not share this medicine with others. What if I miss a dose? If you miss a dose, use it as  soon as you can. If it is almost time for your next dose, use only that dose. Do not use double or extra doses. What may interact with this medication? Interactions are not expected. Do not use any other skin products without telling your doctor or health care professional. This list may not describe all possible interactions. Give your health care provider a list of all the medicines, herbs, non-prescription drugs, or dietary supplements you use. Also tell them if you smoke, drink alcohol, or use illegal drugs. Some items may interact with your medicine. What should I watch for while using this medication? Tell your doctor or health care professional if your symptoms get worse. Four to six months of treatment may be needed for the nail(s) to improve. Some people may not achieve a complete cure or clearing of the nails by this time. Tell your doctor or health care professional if you develop sores or blisters that do not heal properly. If your nail infection returns after stopping using this product, contact your doctor or health care professional. What side effects may I notice from receiving this medication? Side effects that you should report to your doctor or health care professional as soon as possible: allergic reactions like skin rash, itching or hives, swelling of the face, lips, or tongue severe irritation, redness, burning, blistering, peeling, swelling, oozing Side effects that usually do not require medical attention (report to your doctor or health care professional if they continue or are bothersome): mild reddening of the skin nail discoloration temporary burning or mild stinging at the site of application This list may not describe all possible side effects.  Call your doctor for medical advice about side effects. You may report side effects to FDA at 1-800-FDA-1088. Where should I keep my medication? Keep out of the reach of children. Store at room temperature between 15 and 30  degrees C (59 and 86 degrees F). Do not freeze. Protect from light by storing the bottle in the carton after every use. This medicine is flammable. Keep away from heat and flame. Throw away any unused medicine after the expiration date. NOTE: This sheet is a summary. It may not cover all possible information. If you have questions about this medicine, talk to your doctor, pharmacist, or health care provider.  2022 Elsevier/Gold Standard (2007-04-30 16:49:20)

## 2020-12-07 NOTE — Progress Notes (Signed)
Subjective: 65 year old female presents the office today for evaluation of nail fungus.  She said the nails are getting better as they are growing out.  She still using the Penlac.  No swelling redness or drainage the toenail sites.  No other concerns today.   Objective: AAO x3, NAD DP/PT pulses palpable bilaterally, CRT less than 3 seconds Nails to appear to be hypertrophic, dystrophic.  There is clearing on the proximal nail folds to the distal portion still has brown discoloration.  No hyperpigmented changes in the surrounding skin.    Hyperkeratotic tissue.  No underlying ulcerations or signs of infection. No pain with calf compression, swelling, warmth, erythema  Assessment: Onychomycosis, hyperkeratotic lesions  Plan: -All treatment options discussed with the patient including all alternatives, risks, complications.  -As a courtesy debride the nails x10 without any complications or bleeding.  Continue Penlac. -Patient encouraged to call the office with any questions, concerns, change in symptoms.   Trula Slade DPM

## 2020-12-08 ENCOUNTER — Ambulatory Visit (HOSPITAL_BASED_OUTPATIENT_CLINIC_OR_DEPARTMENT_OTHER): Payer: No Typology Code available for payment source

## 2020-12-08 ENCOUNTER — Other Ambulatory Visit (HOSPITAL_BASED_OUTPATIENT_CLINIC_OR_DEPARTMENT_OTHER): Payer: No Typology Code available for payment source

## 2020-12-21 ENCOUNTER — Other Ambulatory Visit (HOSPITAL_BASED_OUTPATIENT_CLINIC_OR_DEPARTMENT_OTHER): Payer: Self-pay

## 2021-01-12 ENCOUNTER — Encounter: Payer: Self-pay | Admitting: Internal Medicine

## 2021-01-14 ENCOUNTER — Other Ambulatory Visit (INDEPENDENT_AMBULATORY_CARE_PROVIDER_SITE_OTHER): Payer: No Typology Code available for payment source

## 2021-01-14 DIAGNOSIS — E785 Hyperlipidemia, unspecified: Secondary | ICD-10-CM

## 2021-01-14 LAB — LIPID PANEL
Cholesterol: 176 mg/dL (ref 0–200)
HDL: 62 mg/dL (ref >39.00)
LDL Cholesterol: 101 mg/dL — ABNORMAL HIGH (ref 0–99)
NonHDL: 114.26
Total CHOL/HDL Ratio: 3
Triglycerides: 65 mg/dL (ref 0.0–149.0)
VLDL: 13 mg/dL (ref 0.0–40.0)

## 2021-01-18 ENCOUNTER — Other Ambulatory Visit: Payer: Self-pay | Admitting: Podiatry

## 2021-01-18 ENCOUNTER — Other Ambulatory Visit: Payer: Self-pay | Admitting: Internal Medicine

## 2021-01-18 ENCOUNTER — Other Ambulatory Visit (HOSPITAL_BASED_OUTPATIENT_CLINIC_OR_DEPARTMENT_OTHER): Payer: Self-pay

## 2021-01-18 MED ORDER — UREA 40 % EX CREA
1.0000 | TOPICAL_CREAM | Freq: Every day | CUTANEOUS | 0 refills | Status: DC
Start: 2021-01-18 — End: 2021-04-05
  Filled 2021-01-18: qty 85, 30d supply, fill #0

## 2021-01-18 MED ORDER — ALBUTEROL SULFATE HFA 108 (90 BASE) MCG/ACT IN AERS
1.0000 | INHALATION_SPRAY | RESPIRATORY_TRACT | 5 refills | Status: DC | PRN
  Filled 2021-01-18: qty 18, 17d supply, fill #0
  Filled 2021-03-02: qty 18, 17d supply, fill #1

## 2021-01-19 ENCOUNTER — Other Ambulatory Visit (HOSPITAL_BASED_OUTPATIENT_CLINIC_OR_DEPARTMENT_OTHER): Payer: Self-pay

## 2021-01-25 ENCOUNTER — Other Ambulatory Visit: Payer: Self-pay

## 2021-01-25 ENCOUNTER — Ambulatory Visit (INDEPENDENT_AMBULATORY_CARE_PROVIDER_SITE_OTHER): Payer: No Typology Code available for payment source | Admitting: Obstetrics & Gynecology

## 2021-01-25 ENCOUNTER — Encounter: Payer: Self-pay | Admitting: Obstetrics & Gynecology

## 2021-01-25 VITALS — BP 132/67 | HR 67 | Ht 65.0 in | Wt 218.0 lb

## 2021-01-25 DIAGNOSIS — Z01419 Encounter for gynecological examination (general) (routine) without abnormal findings: Secondary | ICD-10-CM | POA: Diagnosis not present

## 2021-01-25 NOTE — Progress Notes (Signed)
Subjective:     Brenda Russo is a 65 y.o. female here for a routine exam.  Current complaints: none. Pt is 1 days short of a year for her annual. She called the ins co and they confirmed that she could proceed. Pt deneis GYN issues.      Gynecologic History No LMP recorded. Patient is postmenopausal. Contraception: post menopausal status Last Pap: 01/27/2020. Results were: normal Last mammogram: 12/10/2019. Results were: normal  Obstetric History OB History  Gravida Para Term Preterm AB Living  3 3 3     3   SAB IAB Ectopic Multiple Live Births          3    # Outcome Date GA Lbr Len/2nd Weight Sex Delivery Anes PTL Lv  3 Term 1980 [redacted]w[redacted]d   F CS-LTranv Spinal N LIV  2 Term 1978 [redacted]w[redacted]d   F CS-LTranv EPI N LIV  1 Term 88 [redacted]w[redacted]d   M Vag-Spont None N LIV     The following portions of the patient's history were reviewed and updated as appropriate: allergies, current medications, past family history, past medical history, past social history, past surgical history, and problem list.  Review of Systems Pertinent items are noted in HPI.    Objective:  BP 132/67    Pulse 67    Ht 5\' 5"  (1.651 m)    Wt 218 lb (98.9 kg)    BMI 36.28 kg/m  General Appearance:    Alert, cooperative, no distress, appears stated age  Head:    Normocephalic, without obvious abnormality, atraumatic  Eyes:    conjunctiva/corneas clear, EOM's intact, both eyes  Ears:    Normal external ear canals, both ears  Nose:   Nares normal, septum midline, mucosa normal, no drainage    or sinus tenderness  Throat:   Lips, mucosa, and tongue normal; teeth and gums normal  Neck:   Supple, symmetrical, trachea midline, no adenopathy;    thyroid:  no enlargement/tenderness/nodules  Back:     Symmetric, no curvature, ROM normal, no CVA tenderness  Lungs:     respirations unlabored  Chest Wall:    No tenderness or deformity   Heart:    Regular rate and rhythm  Breast Exam:    No tenderness, masses, or nipple abnormality   Abdomen:     Soft, non-tender, bowel sounds active all four quadrants,    no masses, no organomegaly  Genitalia:    Normal female without lesion, discharge or tenderness     Extremities:   Extremities normal, atraumatic, no cyanosis or edema  Pulses:   2+ and symmetric all extremities  Skin:   Skin color, texture, turgor normal, no rashes or lesions     Assessment:    Healthy female exam.    Plan:   Brenda Russo was seen today for gynecologic exam.  Diagnoses and all orders for this visit:  Well female exam with routine gynecological exam    F/u in 1 year or sooner prn   Treylan Mcclintock L. Harraway-Smith, M.D., [name]

## 2021-01-27 ENCOUNTER — Ambulatory Visit: Payer: No Typology Code available for payment source | Admitting: Family Medicine

## 2021-02-03 ENCOUNTER — Ambulatory Visit: Payer: No Typology Code available for payment source | Admitting: Obstetrics & Gynecology

## 2021-03-02 ENCOUNTER — Other Ambulatory Visit (HOSPITAL_BASED_OUTPATIENT_CLINIC_OR_DEPARTMENT_OTHER): Payer: Self-pay

## 2021-03-02 MED ORDER — CARESTART COVID-19 HOME TEST VI KIT
PACK | 0 refills | Status: DC
  Filled 2021-03-02: qty 4, 4d supply, fill #0

## 2021-04-02 ENCOUNTER — Ambulatory Visit (INDEPENDENT_AMBULATORY_CARE_PROVIDER_SITE_OTHER): Payer: No Typology Code available for payment source | Admitting: Internal Medicine

## 2021-04-02 ENCOUNTER — Encounter: Payer: Self-pay | Admitting: Internal Medicine

## 2021-04-02 VITALS — BP 126/68 | HR 38 | Temp 98.1°F | Resp 16 | Ht 65.0 in | Wt 213.5 lb

## 2021-04-02 DIAGNOSIS — M79602 Pain in left arm: Secondary | ICD-10-CM | POA: Diagnosis not present

## 2021-04-02 MED ORDER — BLOOD PRESSURE KIT DEVI: 0 refills | Status: DC

## 2021-04-02 NOTE — Patient Instructions (Signed)
We are referring you to sports medicine.  IBUPROFEN (Advil or Motrin) 200 mg 2 tablets once or twice a day as needed for pain.  Always take it with food because may cause gastritis and ulcers.  If you notice nausea, stomach pain, change in the color of stools --->  Stop the medicine and let us know   You can alternate ibuprofen with Tylenol

## 2021-04-02 NOTE — Progress Notes (Signed)
Subjective:    Patient ID: Brenda Russo, female    DOB: May 16, 1955, 66 y.o.   MRN: [id number]  DOS:  04/02/2021 Type of visit - description: Acute  Symptoms started 2 days ago: Complain of pain at the left biceps for the last 2 days. 2 days ago she was hurting there, took ibuprofen, went to sleep and the next morning feel better.  Yesterday, she took a walk, at the end of the walk she developed the left biceps pain again.  The pain is worse with certain movements, particularly arm elevation and pulling w/ her arms.  She does not recall any injury. She specifically denies chest pain, difficulty breathing.  No nausea or vomiting. No headache, dizziness, diplopia.   Review of Systems See above   Past Medical History:  Diagnosis Date   Allergic rhinitis    Allergy    Anemia    Anxiety    Asthma    HTN (hypertension)    Hyperlipidemia    borderline   Obese    OSA (obstructive sleep apnea)    mild ,no CPAP   Overweight(278.02)    Plantar wart    history   Prediabetes    Sleep apnea     Past Surgical History:  Procedure Laterality Date   BREAST BIOPSY Right 09/02/2015   CESAREAN SECTION     COLONOSCOPY  11/05/2018   COLONOSCOPY  2010   TUBAL LIGATION      Current Outpatient Medications  Medication Instructions   albuterol (VENTOLIN HFA) 108 (90 Base) MCG/ACT inhaler 1-2 puffs, Inhalation, Every 4 hours PRN   amLODipine (NORVASC) 5 MG tablet TAKE 1 TABLET (5 MG TOTAL) BY MOUTH DAILY.   ciclopirox (PENLAC) 8 % solution Topical, Daily at bedtime, Apply over nail and surrounding skin. Apply daily over previous coat. After seven (7) days, may remove with alcohol and continue cycle.   Coenzyme Q10 (COQ10) 100 MG CAPS 1 capsule, Oral, Daily   Ergocalciferol 5,000 Units, Oral, Daily   fluticasone (FLONASE) 50 MCG/ACT nasal spray Nasal   fluticasone-salmeterol (ADVAIR) 250-50 MCG/ACT AEPB INHALE 1 PUFF BY MOUTH INTO THE LUNGS IN THE MORNING AND AT BEDTIME.    hydrochlorothiazide (HYDRODIURIL) 12.5 MG tablet TAKE 1 TABLET (12.5 MG TOTAL) BY MOUTH DAILY.   losartan (COZAAR) 100 MG tablet TAKE 1 TABLET (100 MG TOTAL) BY MOUTH DAILY.   Magnesium 200 MG TABS Oral   magnesium oxide (MAG-OX) 400 MG tablet 2 tablets, Daily   Multiple Minerals-Vitamins (CALCIUM CITRATE PLUS/MAGNESIUM) TABS 1 tablet, Oral   Multiple Vitamins-Minerals (COMPLETE WOMENS PO) 1 capsule, 2 times daily   Olopatadine HCl 0.2 % SOLN No dose, route, or frequency recorded.   Omega-3 Fatty Acids (FISH OIL) 1000 MG CAPS 1 capsule, Oral   urea (CARMOL) 40 % CREA Apply 1 application on to the skin daily.       Objective:   Physical Exam BP 126/68 (BP Location: Right Arm, Patient Position: Sitting, Cuff Size: Small)    Pulse (!) 38    Temp 98.1 F (36.7 C) (Oral)    Resp 16    Ht 5\' 5"  (1.651 m)    Wt 213 lb 8 oz (96.8 kg)    SpO2 97%    BMI 35.53 kg/m  General:   Well developed, NAD, BMI noted. HEENT:  Normocephalic . Face symmetric, atraumatic Lungs:  CTA B Normal respiratory effort, no intercostal retractions, no accessory muscle use. Heart: Bradycardic. MSK: R arm and shoulder: Normal L arm  and shoulder: Normal to inspection, ROM passive and active: Quite limited by pain. Upon palpation, the left bicep seems to be smaller although there is no deformities Lower extremities: no pretibial edema bilaterally  Skin: Not pale. Not jaundice Neurologic:  alert & oriented X3.  Speech normal, gait appropriate for age and unassisted EOMI, face symmetric. DTR symmetric Psych--  Cognition and judgment appear intact.  Cooperative with normal attention span and concentration.  Behavior appropriate. No anxious or depressed appearing.      Assessment     Assessment Prediabetes A1c 6.0 (05/2014) HTN Sinus bradycardia-PVCs:    Hyperlipidemia Anxiety Obesity Asthma - h/o intubation x2 in the 90s  OSA-mild, dx 2014 , never tried CPAP BTL LLQ abd pain on-off , last GI visit  11-2015, CT abd (-)  PLAN: L arm pain: As described above, biceps partial tear? No reason to suspect neurological or cardiovascular event on clinical grounds. Plan: Refer to sports medicine, continue ibuprofen with GI precaution, alternate with Tylenol. HTN: BP is very good, request a prescription for a sphygmomanometer.  Down. Bradycardia: She is asymptomatic.  Next visit with cardiology next year. Skin lesion: Reports new skin lesion at the R shoulder, she has a dermatologist, encouraged to see them.  States she will.        This visit occurred during the SARS-CoV-2 public health emergency.  Safety protocols were in place, including screening questions prior to the visit, additional usage of staff PPE, and extensive cleaning of exam room while observing appropriate contact time as indicated for disinfecting solutions.

## 2021-04-03 NOTE — Assessment & Plan Note (Signed)
L arm pain: As described above, biceps partial tear? No reason to suspect neurological or cardiovascular event on clinical grounds. Plan: Refer to sports medicine, continue ibuprofen with GI precaution, alternate with Tylenol. HTN: BP is very good, request a prescription for a sphygmomanometer.  Down. Bradycardia: She is asymptomatic.  Next visit with cardiology next year. Skin lesion: Reports new skin lesion at the R shoulder, she has a dermatologist, encouraged to see them.  States she will.

## 2021-04-05 ENCOUNTER — Ambulatory Visit: Payer: No Typology Code available for payment source | Admitting: Family Medicine

## 2021-04-05 ENCOUNTER — Other Ambulatory Visit (HOSPITAL_BASED_OUTPATIENT_CLINIC_OR_DEPARTMENT_OTHER): Payer: Self-pay

## 2021-04-05 ENCOUNTER — Ambulatory Visit: Payer: Self-pay

## 2021-04-05 VITALS — BP 130/78 | Ht 65.0 in | Wt 210.0 lb

## 2021-04-05 DIAGNOSIS — M659 Synovitis and tenosynovitis, unspecified: Secondary | ICD-10-CM | POA: Insufficient documentation

## 2021-04-05 DIAGNOSIS — M65912 Unspecified synovitis and tenosynovitis, left shoulder: Secondary | ICD-10-CM | POA: Insufficient documentation

## 2021-04-05 DIAGNOSIS — M65812 Other synovitis and tenosynovitis, left shoulder: Secondary | ICD-10-CM | POA: Diagnosis not present

## 2021-04-05 DIAGNOSIS — M25512 Pain in left shoulder: Secondary | ICD-10-CM

## 2021-04-05 MED ORDER — PREDNISONE 5 MG PO TABS
ORAL_TABLET | ORAL | 0 refills | Status: DC
  Filled 2021-04-05: qty 21, 6d supply, fill #0

## 2021-04-05 NOTE — Assessment & Plan Note (Signed)
Acutely occurring.  Appears to have an inflammatory origin given the exam today. -Counseled on home exercise therapy and supportive care. -Prednisone. -Uric acid, ANA, sed rate and CRP. -Could consider injection or further imaging.

## 2021-04-05 NOTE — Patient Instructions (Signed)
Nice to meet you Please try ice as needed Please try the prednisone and do not use with ibuprofen  I will call with the results.   Please send me a message in MyChart with any questions or updates.  Please see me back in 4 weeks.   --Dr. Raeford Razor

## 2021-04-05 NOTE — Progress Notes (Signed)
°  [name] - 66 y.o. female MRN [id number]  Date of birth: [birth year]  SUBJECTIVE:  Including CC & ROS.  No chief complaint on file.   Brenda Russo is a 66 y.o. female that is presenting with acute left shoulder pain.  The pain is been ongoing for a few days.  She is having limited range of motion.  Has gotten improvement with ibuprofen.  No injury inciting event.  No history of inflammatory disease.    Review of Systems See HPI   HISTORY: Past Medical, Surgical, Social, and Family History Reviewed & Updated per EMR.   Pertinent Historical Findings include:  Past Medical History:  Diagnosis Date   Allergic rhinitis    Allergy    Anemia    Anxiety    Asthma    HTN (hypertension)    Hyperlipidemia    borderline   Obese    OSA (obstructive sleep apnea)    mild ,no CPAP   Overweight(278.02)    Plantar wart    history   Prediabetes    Sleep apnea     Past Surgical History:  Procedure Laterality Date   BREAST BIOPSY Right 09/02/2015   CESAREAN SECTION     COLONOSCOPY  11/05/2018   COLONOSCOPY  2010   TUBAL LIGATION       PHYSICAL EXAM:  VS: BP 130/78    Ht 5\' 5"  (1.651 m)    Wt 210 lb (95.3 kg)    BMI 34.95 kg/m  Physical Exam Gen: NAD, alert, cooperative with exam, well-appearing MSK:  Neurovascularly intact    Limited ultrasound: Left shoulder:  Large effusion within the biceps tendon as well as a thickened synovium overlying the subscapularis. Thickened ligament overlying the biceps tendon. Large thickened bursitis overlying the supraspinatus with hyperemia  Summary: Findings consistent with synovitis  Ultrasound and interpretation by Clearance Coots, MD    ASSESSMENT & PLAN:   Synovitis of left shoulder Acutely occurring.  Appears to have an inflammatory origin given the exam today. -Counseled on home exercise therapy and supportive care. -Prednisone. -Uric acid, ANA, sed rate and CRP. -Could consider injection or further  imaging.

## 2021-04-10 LAB — C-REACTIVE PROTEIN: CRP: 23 mg/L — ABNORMAL HIGH (ref 0–10)

## 2021-04-10 LAB — ANA,IFA RA DIAG PNL W/RFLX TIT/PATN
ANA Titer 1: NEGATIVE
Cyclic Citrullin Peptide Ab: 12 units (ref 0–19)
Rheumatoid fact SerPl-aCnc: 10.6 IU/mL (ref <14.0)

## 2021-04-10 LAB — SEDIMENTATION RATE: Sed Rate: 25 mm/hr (ref 0–40)

## 2021-04-10 LAB — URIC ACID: Uric Acid: 4.1 mg/dL (ref 3.0–7.2)

## 2021-04-12 ENCOUNTER — Telehealth: Payer: Self-pay | Admitting: Family Medicine

## 2021-04-12 NOTE — Telephone Encounter (Signed)
Left VM for patient. If she calls back please have her speak with a nurse/CMA and inform that her labs show a mild elevation of non-specific inflammatory markers. We could consider an injection going forward.  ? ?If any questions then please take the best time and phone number to call and I will try to call her back.  ? ?Rosemarie Ax, MD ?[facility] ?04/12/2021, 9:39 AM ? ? ?

## 2021-04-12 NOTE — Telephone Encounter (Signed)
Pt cld states her arms are a bit achy but the shoulder pain has greatly improved since taking the prednisone-- Are there any exercises Doctor recommends --Pls advise-- pt says can send info thru Mychart or call her . ? ?-Forwarding msg to med asst. ? ?-glh ?

## 2021-04-12 NOTE — Telephone Encounter (Signed)
Left VM for patient. If she calls back please have her speak with a nurse/CMA and inform that we have some exercises printed out for her. I'm not able to send them through [facility].  ? ?If any questions then please take the best time and phone number to call and I will try to call her back.  ? ?Rosemarie Ax, MD ?[facility] ?04/12/2021, 5:01 PM ? ? ?

## 2021-04-29 ENCOUNTER — Encounter: Payer: Self-pay | Admitting: Internal Medicine

## 2021-05-03 ENCOUNTER — Encounter: Payer: Self-pay | Admitting: Family Medicine

## 2021-05-03 ENCOUNTER — Ambulatory Visit (INDEPENDENT_AMBULATORY_CARE_PROVIDER_SITE_OTHER): Payer: No Typology Code available for payment source | Admitting: Family Medicine

## 2021-05-03 ENCOUNTER — Other Ambulatory Visit (HOSPITAL_BASED_OUTPATIENT_CLINIC_OR_DEPARTMENT_OTHER): Payer: Self-pay

## 2021-05-03 DIAGNOSIS — M65812 Other synovitis and tenosynovitis, left shoulder: Secondary | ICD-10-CM | POA: Diagnosis not present

## 2021-05-03 NOTE — Assessment & Plan Note (Signed)
Has improved with modalities today.  Lab testing was for nonspecific elevation.  She may be at the beginning of an inflammatory process or just a seronegative process.  ?-Counseled on home exercise therapy and supportive care. ?-Could consider injection or physical therapy.  ?

## 2021-05-03 NOTE — Progress Notes (Signed)
?  Brenda Russo - 66 y.o. female MRN [id number]  Date of birth: 1955/07/08 ? ?SUBJECTIVE:  Including CC & ROS.  ?No chief complaint on file. ? ? ?Brenda Russo is a 66 y.o. female that is following up for left shoulder pain.  Her pain has improved and she has more normal range of motion.  Her lab work was revealing for a mildly elevated C-reactive protein but was otherwise normal.  She denies any family history that is contributory.  No other joints that are red or swollen. ? ? ? ?Review of Systems ?See HPI  ? ?HISTORY: Past Medical, Surgical, Social, and Family History Reviewed & Updated per EMR.   ?Pertinent Historical Findings include: ? ?Past Medical History:  ?Diagnosis Date  ? Allergic rhinitis   ? Allergy   ? Anemia   ? Anxiety   ? Asthma   ? HTN (hypertension)   ? Hyperlipidemia   ? borderline  ? Obese   ? OSA (obstructive sleep apnea)   ? mild ,no CPAP  ? Overweight(278.02)   ? Plantar wart   ? history  ? Prediabetes   ? Sleep apnea   ? ? ?Past Surgical History:  ?Procedure Laterality Date  ? BREAST BIOPSY Right 09/02/2015  ? CESAREAN SECTION    ? COLONOSCOPY  11/05/2018  ? COLONOSCOPY  2010  ? TUBAL LIGATION    ? ? ? ?PHYSICAL EXAM:  ?VS: BP (!) 160/98 (BP Location: Left Arm, Patient Position: Sitting)   Ht '5\' 5"'$  (1.651 m)   Wt 210 lb (95.3 kg)   BMI 34.95 kg/m?  ?Physical Exam ?Gen: NAD, alert, cooperative with exam, well-appearing ?MSK:  ?Neurovascularly intact   ? ? ? ? ?ASSESSMENT & PLAN:  ? ?Synovitis of left shoulder ?Has improved with modalities today.  Lab testing was for nonspecific elevation.  She may be at the beginning of an inflammatory process or just a seronegative process.  ?-Counseled on home exercise therapy and supportive care. ?-Could consider injection or physical therapy.  ? ? ? ? ?

## 2021-05-18 ENCOUNTER — Ambulatory Visit: Payer: No Typology Code available for payment source | Admitting: Medical

## 2021-05-18 ENCOUNTER — Telehealth: Payer: Self-pay | Admitting: Internal Medicine

## 2021-05-18 NOTE — Telephone Encounter (Signed)
Appt w/ Edward today.  

## 2021-05-18 NOTE — Telephone Encounter (Signed)
Wrong pt's chart, appointment cancelled  ?

## 2021-05-18 NOTE — Telephone Encounter (Signed)
Pt states he's having a allergic reaction on his nose, chin, and neck area. Transferred to triage  ?

## 2021-06-03 ENCOUNTER — Other Ambulatory Visit (HOSPITAL_BASED_OUTPATIENT_CLINIC_OR_DEPARTMENT_OTHER): Payer: Self-pay

## 2021-06-03 MED ORDER — COVID-19 AT HOME ANTIGEN TEST VI KIT
PACK | 0 refills | Status: DC
  Filled 2021-06-03: qty 8, 8d supply, fill #0

## 2021-06-15 ENCOUNTER — Ambulatory Visit: Payer: No Typology Code available for payment source | Admitting: Internal Medicine

## 2021-06-24 ENCOUNTER — Other Ambulatory Visit (HOSPITAL_COMMUNITY): Payer: Self-pay

## 2021-06-24 MED ORDER — AMOXICILLIN 500 MG PO CAPS
500.0000 mg | ORAL_CAPSULE | Freq: Three times a day (TID) | ORAL | 1 refills | Status: DC
  Filled 2021-06-24: qty 21, 7d supply, fill #0

## 2021-06-28 ENCOUNTER — Encounter: Payer: Self-pay | Admitting: Internal Medicine

## 2021-06-28 ENCOUNTER — Ambulatory Visit: Payer: No Typology Code available for payment source | Admitting: Internal Medicine

## 2021-06-28 VITALS — BP 116/78 | HR 72 | Temp 98.0°F | Ht 65.0 in | Wt 215.0 lb

## 2021-06-28 DIAGNOSIS — J4540 Moderate persistent asthma, uncomplicated: Secondary | ICD-10-CM | POA: Diagnosis not present

## 2021-06-28 LAB — NITRIC OXIDE: Nitric Oxide: 17

## 2021-06-28 NOTE — Progress Notes (Unsigned)
Brenda Russo, female    DOB: Jun 18, 1955    MRN: [id number]   Brief patient profile:  64  yobf never smoker/ born prematurely but able to keep up with other kids  works but asthma as child and never outgrew prev under Dr Jeannine Kitten care and maintained on [place] 200 one daily with dx of moderate chronic asthma referred herself to me to establish for pulmonary f/u for asthma    History of Present Illness  11/18/2019  Pulmonary/ 1st office eval/Brenda Russo unhappy with cost of breo 200  Chief Complaint  Patient presents with   Follow-up    Denies any problems with breathing  Dyspnea:  Not limited by breathing from desired activities  -no regular aerobics or steps Cough: none  Sleep: no problem at 10 degrees  SABA use: none Brenda Russo 250 one puff first thing in am then repeat in 12 hours    06/15/2020  f/u ov/Mycala Warshawsky re: mod asthma prefers once daily  breo 200 but does fine on advair 250 bid when remembers to take 2nd dose and can't afford the breo on her plan Chief Complaint  Patient presents with   Follow-up    Breathing is overall doing well. She rarely uses her albuterol inhaler. She is currently on Breo 200 bc she had some left over and prefers inhaler that she only has to use once per day. Ins covers Advair.   Dyspnea:  No regular ex  Cough: none  Sleeping: 10 degrees ok  SABA use: rarely  02: none  Covid status:   vax x 2 p 2nd was sept 2021  Rec No change in your medications  To get the most out of exercise, you need to be continuously aware that you are short of breath, but never out of breath, for at least 30 minutes daily.      06/28/2021  f/u ov/Brenda Russo re: mod asthma  maint on Brenda Russo  250  Chief Complaint  Patient presents with   Follow-up    Breathing is about the same. She states some days she feels a pressure in her chest- depends on the pollen. She rarely uses her albuterol inhaler.    Dyspnea:  short jog 500 ft to neighbor's house  Cough: not a problem Sleeping: ok 10  degrees  SABA use: rarely  02: none     No obvious day to day or daytime variability or assoc excess/ purulent sputum or mucus plugs or hemoptysis or cp or chest tightness, subjective wheeze or overt sinus or hb symptoms.   Sleeping as above  without nocturnal  or early am exacerbation  of respiratory  c/o's or need for noct saba. Also denies any obvious fluctuation of symptoms with weather or environmental changes or other aggravating or alleviating factors except as outlined above   No unusual exposure hx or h/o childhood pna/ asthma or knowledge of premature birth.  Current Allergies, Complete Past Medical History, Past Surgical History, Family History, and Social History were reviewed in [employer] record.  ROS  The following are not active complaints unless bolded Hoarseness, sore throat, dysphagia, dental problems, itching, sneezing,  nasal congestion or discharge of excess mucus or purulent secretions, ear ache,   fever, chills, sweats, unintended wt loss or wt gain, classically pleuritic or exertional cp,  orthopnea pnd or arm/hand swelling  or leg swelling, presyncope, palpitations, abdominal pain, anorexia, nausea, vomiting, diarrhea  or change in bowel habits or change in bladder habits, change in stools or  change in urine, dysuria, hematuria,  rash, arthralgias L shoulder , visual complaints, headache, numbness, weakness or ataxia or problems with walking or coordination,  change in mood or  memory.        Current Meds  Medication Sig   amLODipine (NORVASC) 5 MG tablet TAKE 1 TABLET (5 MG TOTAL) BY MOUTH DAILY.   amoxicillin (AMOXIL) 500 MG capsule Take 1 capsule (500 mg total) by mouth 3 (three) times daily.   Blood Pressure Monitoring (BLOOD PRESSURE KIT) DEVI Check blood pressure 3-4 times weekly   ciclopirox (PENLAC) 8 % solution Apply topically at bedtime. Apply over nail and surrounding skin. Apply daily over previous coat. After seven (7) days, may  remove with alcohol and continue cycle.   Coenzyme Q10 (COQ10) 100 MG CAPS Take 1 capsule by mouth daily.   COVID-19 At Home Antigen Test [facility] COVID-19 HOME TEST) KIT Use as directed per package instructions   Ergocalciferol 2500 units CAPS Take 5,000 Units by mouth daily.   fluticasone (FLONASE) 50 MCG/ACT nasal spray Place 2 sprays into the nose daily as needed.   fluticasone-salmeterol (ADVAIR) 250-50 MCG/ACT AEPB INHALE 1 PUFF BY MOUTH INTO THE LUNGS IN THE MORNING AND AT BEDTIME.   hydrochlorothiazide (HYDRODIURIL) 12.5 MG tablet TAKE 1 TABLET (12.5 MG TOTAL) BY MOUTH DAILY.   losartan (COZAAR) 100 MG tablet TAKE 1 TABLET (100 MG TOTAL) BY MOUTH DAILY.   Magnesium 200 MG TABS Take by mouth.   Multiple Minerals-Vitamins (CALCIUM CITRATE PLUS/MAGNESIUM) TABS Take 1 tablet by mouth.   Omega-3 Fatty Acids (FISH OIL) 1000 MG CAPS Take 1 capsule by mouth.          Past Medical History:  Diagnosis Date   Allergic rhinitis    Allergy    Anemia    Anxiety    Asthma    HTN (hypertension)    Hyperlipidemia    borderline   Obese    OSA (obstructive sleep apnea)    mild ,no CPAP   Overweight(278.02)    Plantar wart    history   Prediabetes    Sleep apnea         Objective:     06/28/2021      215   06/15/20 218 lb (98.9 kg)  04/07/20 216 lb (98 kg)  02/06/20 221 lb 6 oz (100.4 kg)     Vital signs reviewed  06/28/2021  - Note at rest 02 sats  95% on RA    General appearance:    amb bf nad    HEENT : Oropharynx nl   Nasal turbintes nl    NECK :  without  appent JVD/ palpable Nodes/TM    LUNGS: no acc muscle use,  Nl contour chest which is clear to A and P bilaterally without cough on insp or exp maneuvers   CV:  RRR  no s3 or murmur or increase in P2, and no edema   ABD:  soft and nontender with nl inspiratory excursion in the supine position. No bruits or organomegaly appreciated   MS:  Nl gait/ ext warm without deformities or   calf tenderness, cyanosis or  clubbing  L shoulder can't abduct above horizontal     SKIN: warm and dry without lesions    NEURO:  alert, approp, nl sensorium with  no motor or cerebellar deficits apparent.          Assessment

## 2021-06-28 NOTE — Patient Instructions (Signed)
No change in medications for now   Only use your albuterol as a rescue medication to be used if you can't catch your breath by resting or doing a relaxed purse lip breathing pattern.  - The less you use it, the better it will work when you need it. - Ok to use up to 2 puffs  every 4 hours if you must but call for immediate appointment if use goes up over your usual need - Don't leave home without it !!  (think of it like the spare tire for your car)   Also Ok to try albuterol 15 min before an activity (on alternating days)  that you know would usually make you short of breath and see if it makes any difference and if makes none then don't take albuterol after activity unless you can't catch your breath as this means it's the resting that helps, not the albuterol.      F/u yearly, sooner if needed

## 2021-06-29 ENCOUNTER — Encounter: Payer: Self-pay | Admitting: Internal Medicine

## 2021-06-29 NOTE — Assessment & Plan Note (Signed)
Never smoker - Spirometry  12/24/15 FEV1 1.7 (76%)  Ratio 0.61 with classic concavity unknown whether p meds or not    All goals of chronic asthma control met including optimal function and elimination of symptoms with minimal need for rescue therapy.  Contingencies discussed in full including contacting this office immediately if not controlling the symptoms using the rule of two's.     She still has doe which is likely conditioning related:  To tell the difference rec: Ok to try albuterol 15 min before an activity (on alternating days)  that you know would usually make you short of breath and see if it makes any difference and if makes none then don't take albuterol after activity unless you can't catch your breath as this means it's the resting that helps, not the albuterol.  F/u can be yearly, sooner prn         Each maintenance medication was reviewed in detail including emphasizing most importantly the difference between maintenance and prns and under what circumstances the prns are to be triggered using an action plan format where appropriate.  Total time for H and P, chart review, counseling, reviewing dpi device(s) and generating customized AVS unique to this office visit / same day charting = 23 min

## 2021-07-02 ENCOUNTER — Other Ambulatory Visit (HOSPITAL_BASED_OUTPATIENT_CLINIC_OR_DEPARTMENT_OTHER): Payer: Self-pay

## 2021-08-03 ENCOUNTER — Other Ambulatory Visit (HOSPITAL_BASED_OUTPATIENT_CLINIC_OR_DEPARTMENT_OTHER): Payer: Self-pay

## 2021-08-03 ENCOUNTER — Other Ambulatory Visit: Payer: Self-pay | Admitting: Internal Medicine

## 2021-08-03 MED ORDER — LOSARTAN POTASSIUM 100 MG PO TABS
ORAL_TABLET | Freq: Every day | ORAL | 0 refills | Status: DC
  Filled 2021-08-03: qty 90, 90d supply, fill #0

## 2021-08-03 MED ORDER — HYDROCHLOROTHIAZIDE 12.5 MG PO TABS
ORAL_TABLET | Freq: Every day | ORAL | 0 refills | Status: DC
  Filled 2021-08-03: qty 90, 90d supply, fill #0

## 2021-08-03 MED ORDER — AMLODIPINE BESYLATE 5 MG PO TABS
ORAL_TABLET | Freq: Every day | ORAL | 0 refills | Status: DC
Start: 2021-08-03 — End: 2021-11-22
  Filled 2021-08-03: qty 90, 90d supply, fill #0

## 2021-08-06 ENCOUNTER — Encounter: Payer: Self-pay | Admitting: Internal Medicine

## 2021-08-06 ENCOUNTER — Ambulatory Visit (INDEPENDENT_AMBULATORY_CARE_PROVIDER_SITE_OTHER): Payer: No Typology Code available for payment source | Admitting: Internal Medicine

## 2021-08-06 VITALS — BP 126/82 | HR 62 | Temp 98.2°F | Resp 16 | Ht 65.0 in | Wt 218.5 lb

## 2021-08-06 DIAGNOSIS — E559 Vitamin D deficiency, unspecified: Secondary | ICD-10-CM | POA: Diagnosis not present

## 2021-08-06 DIAGNOSIS — Z0000 Encounter for general adult medical examination without abnormal findings: Secondary | ICD-10-CM

## 2021-08-06 DIAGNOSIS — R739 Hyperglycemia, unspecified: Secondary | ICD-10-CM | POA: Diagnosis not present

## 2021-08-06 DIAGNOSIS — I10 Essential (primary) hypertension: Secondary | ICD-10-CM | POA: Diagnosis not present

## 2021-08-06 LAB — LIPID PANEL
Cholesterol: 175 mg/dL (ref 0–200)
HDL: 60.1 mg/dL (ref >39.00)
LDL Cholesterol: 107 mg/dL — ABNORMAL HIGH (ref 0–99)
NonHDL: 114.4
Total CHOL/HDL Ratio: 3
Triglycerides: 39 mg/dL (ref 0.0–149.0)
VLDL: 7.8 mg/dL (ref 0.0–40.0)

## 2021-08-06 LAB — CBC WITH DIFFERENTIAL/PLATELET
Basophils Absolute: 0.1 10*3/uL (ref 0.0–0.1)
Basophils Relative: 1 % (ref 0.0–3.0)
Eosinophils Absolute: 0.2 10*3/uL (ref 0.0–0.7)
Eosinophils Relative: 2.7 % (ref 0.0–5.0)
HCT: 41.7 % (ref 36.0–46.0)
Hemoglobin: 13.6 g/dL (ref 12.0–15.0)
Lymphocytes Relative: 30.7 % (ref 12.0–46.0)
Lymphs Abs: 1.9 10*3/uL (ref 0.7–4.0)
MCHC: 32.6 g/dL (ref 30.0–36.0)
MCV: 86.7 fl (ref 78.0–100.0)
Monocytes Absolute: 0.4 10*3/uL (ref 0.1–1.0)
Monocytes Relative: 6.2 % (ref 3.0–12.0)
Neutro Abs: 3.6 10*3/uL (ref 1.4–7.7)
Neutrophils Relative %: 59.4 % (ref 43.0–77.0)
Platelets: 275 10*3/uL (ref 150.0–400.0)
RBC: 4.8 Mil/uL (ref 3.87–5.11)
RDW: 14 % (ref 11.5–15.5)
WBC: 6.1 10*3/uL (ref 4.0–10.5)

## 2021-08-06 LAB — COMPREHENSIVE METABOLIC PANEL
ALT: 11 U/L (ref 0–35)
AST: 15 U/L (ref 0–37)
Albumin: 3.9 g/dL (ref 3.5–5.2)
Alkaline Phosphatase: 95 U/L (ref 39–117)
BUN: 12 mg/dL (ref 6–23)
CO2: 29 mEq/L (ref 19–32)
Calcium: 8.9 mg/dL (ref 8.4–10.5)
Chloride: 105 mEq/L (ref 96–112)
Creatinine, Ser: 0.72 mg/dL (ref 0.40–1.20)
GFR: 88.18 mL/min (ref >60.00)
Glucose, Bld: 101 mg/dL — ABNORMAL HIGH (ref 70–99)
Potassium: 3.9 mEq/L (ref 3.5–5.1)
Sodium: 140 mEq/L (ref 135–145)
Total Bilirubin: 0.5 mg/dL (ref 0.2–1.2)
Total Protein: 7.1 g/dL (ref 6.0–8.3)

## 2021-08-06 LAB — TSH: TSH: 1.45 u[IU]/mL (ref 0.35–5.50)

## 2021-08-06 LAB — HEMOGLOBIN A1C: Hgb A1c MFr Bld: 6.1 % (ref 4.6–6.5)

## 2021-08-06 LAB — VITAMIN D 25 HYDROXY (VIT D DEFICIENCY, FRACTURES): VITD: 58.04 ng/mL (ref 30.00–100.00)

## 2021-08-06 NOTE — Progress Notes (Signed)
Subjective:    Patient ID: Brenda Russo, female    DOB: December 20, 1955, 66 y.o.   MRN: [id number]  DOS:  08/06/2021 Type of visit - description: CPX  Since the last visit he is doing well and has no major concerns for me except that she has gained some weight.  Wt Readings from Last 3 Encounters:  08/06/21 218 lb 8 oz (99.1 kg)  06/28/21 215 lb (97.5 kg)  05/03/21 210 lb (95.3 kg)     Review of Systems  Other than above, a 14 point review of systems is negative     Past Medical History:  Diagnosis Date   Allergic rhinitis    Allergy    Anemia    Anxiety    Asthma    HTN (hypertension)    Hyperlipidemia    borderline   Obese    OSA (obstructive sleep apnea)    mild ,no CPAP   Overweight(278.02)    Plantar wart    history   Prediabetes    Sleep apnea     Past Surgical History:  Procedure Laterality Date   BREAST BIOPSY Right 09/02/2015   CESAREAN SECTION     COLONOSCOPY  11/05/2018   COLONOSCOPY  2010   TUBAL LIGATION     Social History   Socioeconomic History   Marital status: Married    Spouse name: Not on file   Number of children: 3   Years of education: Not on file   Highest education level: Not on file  Occupational History   Occupation: Retail banker at [employer]    Employer: [employer]: first shift  Tobacco Use   Smoking status: Never   Smokeless tobacco: Never  Substance and Sexual Activity   Alcohol use: No   Drug use: No   Sexual activity: Yes  Other Topics Concern   Not on file  Social History Narrative   3 biological children   1 adopted child   3 step-children   Household-- pt , husband and a son   Social Determinants of Radio broadcast assistant Strain: Not on file  Food Insecurity: Not on file  Transportation Needs: Not on file  Physical Activity: Not on file  Stress: Not on file  Social Connections: Not on file  Intimate Partner Violence: Not on file    Current Outpatient Medications  Medication  Instructions   amLODipine (NORVASC) 5 MG tablet TAKE 1 TABLET (5 MG TOTAL) BY MOUTH DAILY.   Blood Pressure Monitoring (BLOOD PRESSURE KIT) DEVI Check blood pressure 3-4 times weekly   ciclopirox (PENLAC) 8 % solution Topical, Daily at bedtime, Apply over nail and surrounding skin. Apply daily over previous coat. After seven (7) days, may remove with alcohol and continue cycle.   Coenzyme Q10 (COQ10) 100 MG CAPS 1 capsule, Oral, Daily   Ergocalciferol 5,000 Units, Oral, Daily   fluticasone (FLONASE) 50 MCG/ACT nasal spray 2 sprays, Nasal, Daily PRN   fluticasone-salmeterol (ADVAIR) 250-50 MCG/ACT AEPB INHALE 1 PUFF BY MOUTH INTO THE LUNGS IN THE MORNING AND AT BEDTIME.   hydrochlorothiazide (HYDRODIURIL) 12.5 MG tablet TAKE 1 TABLET (12.5 MG TOTAL) BY MOUTH DAILY.   losartan (COZAAR) 100 MG tablet TAKE 1 TABLET (100 MG TOTAL) BY MOUTH DAILY.   Magnesium 200 MG TABS Oral   Multiple Minerals-Vitamins (CALCIUM CITRATE PLUS/MAGNESIUM) TABS 1 tablet, Oral   Omega-3 Fatty Acids (FISH OIL) 1000 MG CAPS 1 capsule, Oral  Objective:   Physical Exam BP 126/82   Pulse 62   Temp 98.2 F (36.8 C) (Oral)   Resp 16   Ht '5\' 5"'  (1.651 m)   Wt 218 lb 8 oz (99.1 kg)   SpO2 96%   BMI 36.36 kg/m  General: Well developed, NAD, BMI noted Neck: No  thyromegaly  HEENT:  Normocephalic . Face symmetric, atraumatic Lungs:  CTA B Normal respiratory effort, no intercostal retractions, no accessory muscle use. Heart: RRR,  no murmur.  Abdomen:  Not distended, soft, non-tender. No rebound or rigidity.   Lower extremities: no pretibial edema bilaterally  Skin: Exposed areas without rash. Not pale. Not jaundice Neurologic:  alert & oriented X3.  Speech normal, gait appropriate for age and unassisted Strength symmetric and appropriate for age.  Psych: Cognition and judgment appear intact.  Cooperative with normal attention span and concentration.  Behavior appropriate. No anxious or depressed  appearing.     Assessment    Assessment Prediabetes A1c 6.0 (05/2014) HTN Sinus bradycardia-PVCs:    Hyperlipidemia Obesity Asthma - h/o intubation x2 in the 90s  OSA-mild, dx 2014 , never tried CPAP BTL LLQ abd pain on-off , last GI visit 11-2015, CT abd (-) H/o anxiety  PLAN: Here for CPX Hyperglycemia: Check A1c HTN: BP is very good, is doing better at home, continue amlodipine, losartan, HCTZ.  Continue ambulatory BPs.  Labs. H/o Sinus bradycardia, PVCs: No symptoms, denies chest pain or palpitations. Obesity: Extensive discussion about diet and exercise. Hyperlipidemia: Diet controlled, current CV RF 7.2%. Vitamin D deficiency: On supplements.  Labs. RTC 1 year

## 2021-08-06 NOTE — Assessment & Plan Note (Signed)
--  Tdap ,pnm shot, shingrex,: Declined before and today. - She does get flu shots @ work    - Animal nutritionist x2, booster rec. --Female care: per gyn, PAP 01-2020 per [facility], had one 2022 per pt; MMG 11-2020  --CCS--08/19/08- normal, C-scope 10/2018, polyps, next 7 hears per GI letter (Dr Ardis Hughs) --dexa 07-2015 WNL ,  DEXA 11-2020 T score (-) 1.1, rx vit d --diet and exercise: Extensive discussion.  Needs to change to a sustainable way to eat.   She is active, count her steps. --labs: CMP, FLP, CBC, A1c, TSH, vitamin D -POA information provided

## 2021-08-06 NOTE — Assessment & Plan Note (Signed)
Here for CPX Hyperglycemia: Check A1c HTN: BP is very good, is doing better at home, continue amlodipine, losartan, HCTZ.  Continue ambulatory BPs.  Labs. H/o Sinus bradycardia, PVCs: No symptoms, denies chest pain or palpitations. Obesity: Extensive discussion about diet and exercise. Hyperlipidemia: Diet controlled, current CV RF 7.2%. Vitamin D deficiency: On supplements.  Labs. RTC 1 year

## 2021-08-06 NOTE — Patient Instructions (Signed)
Check the  blood pressure regularly BP GOAL is between 110/65 and  135/85. If it is consistently higher or lower, let me know    GO TO THE LAB : Get the blood work     [place], Butts back for   a physical exam in 1 year.  Call sooner if needed      "Living will", "Union of attorney": Advanced care planning  (If you already have a living will or healthcare power of attorney, please bring the copy to be scanned in your chart.)  Advance care planning is a process that supports adults in  understanding and sharing their preferences regarding future medical care.   The patient's preferences are recorded in documents called Advance Directives.    Advanced directives are completed (and can be modified at any time) while the patient is in full mental capacity.   The documentation should be available at all times to the patient, the family and the healthcare providers.  Bring in a copy to be scanned in your chart is an excellent idea and is recommended   This legal documents direct treatment decision making and/or appoint a surrogate to make the decision if the patient is not capable to do so.    Advance directives can be documented in many types of formats,  documents have names such as:  Lliving will  Durable power of attorney for healthcare (healthcare proxy or healthcare power of attorney)  Combined directives  Physician orders for life-sustaining treatment    More information at:  [email or web address]

## 2021-08-09 IMAGING — DX DG CHEST 2V
2 series · 2 of 2 positions shown · non-contrast
Comparison: 03/17/2018

CLINICAL DATA: Dysphagia

EXAM:
CHEST - 2 VIEW

[chest lat]
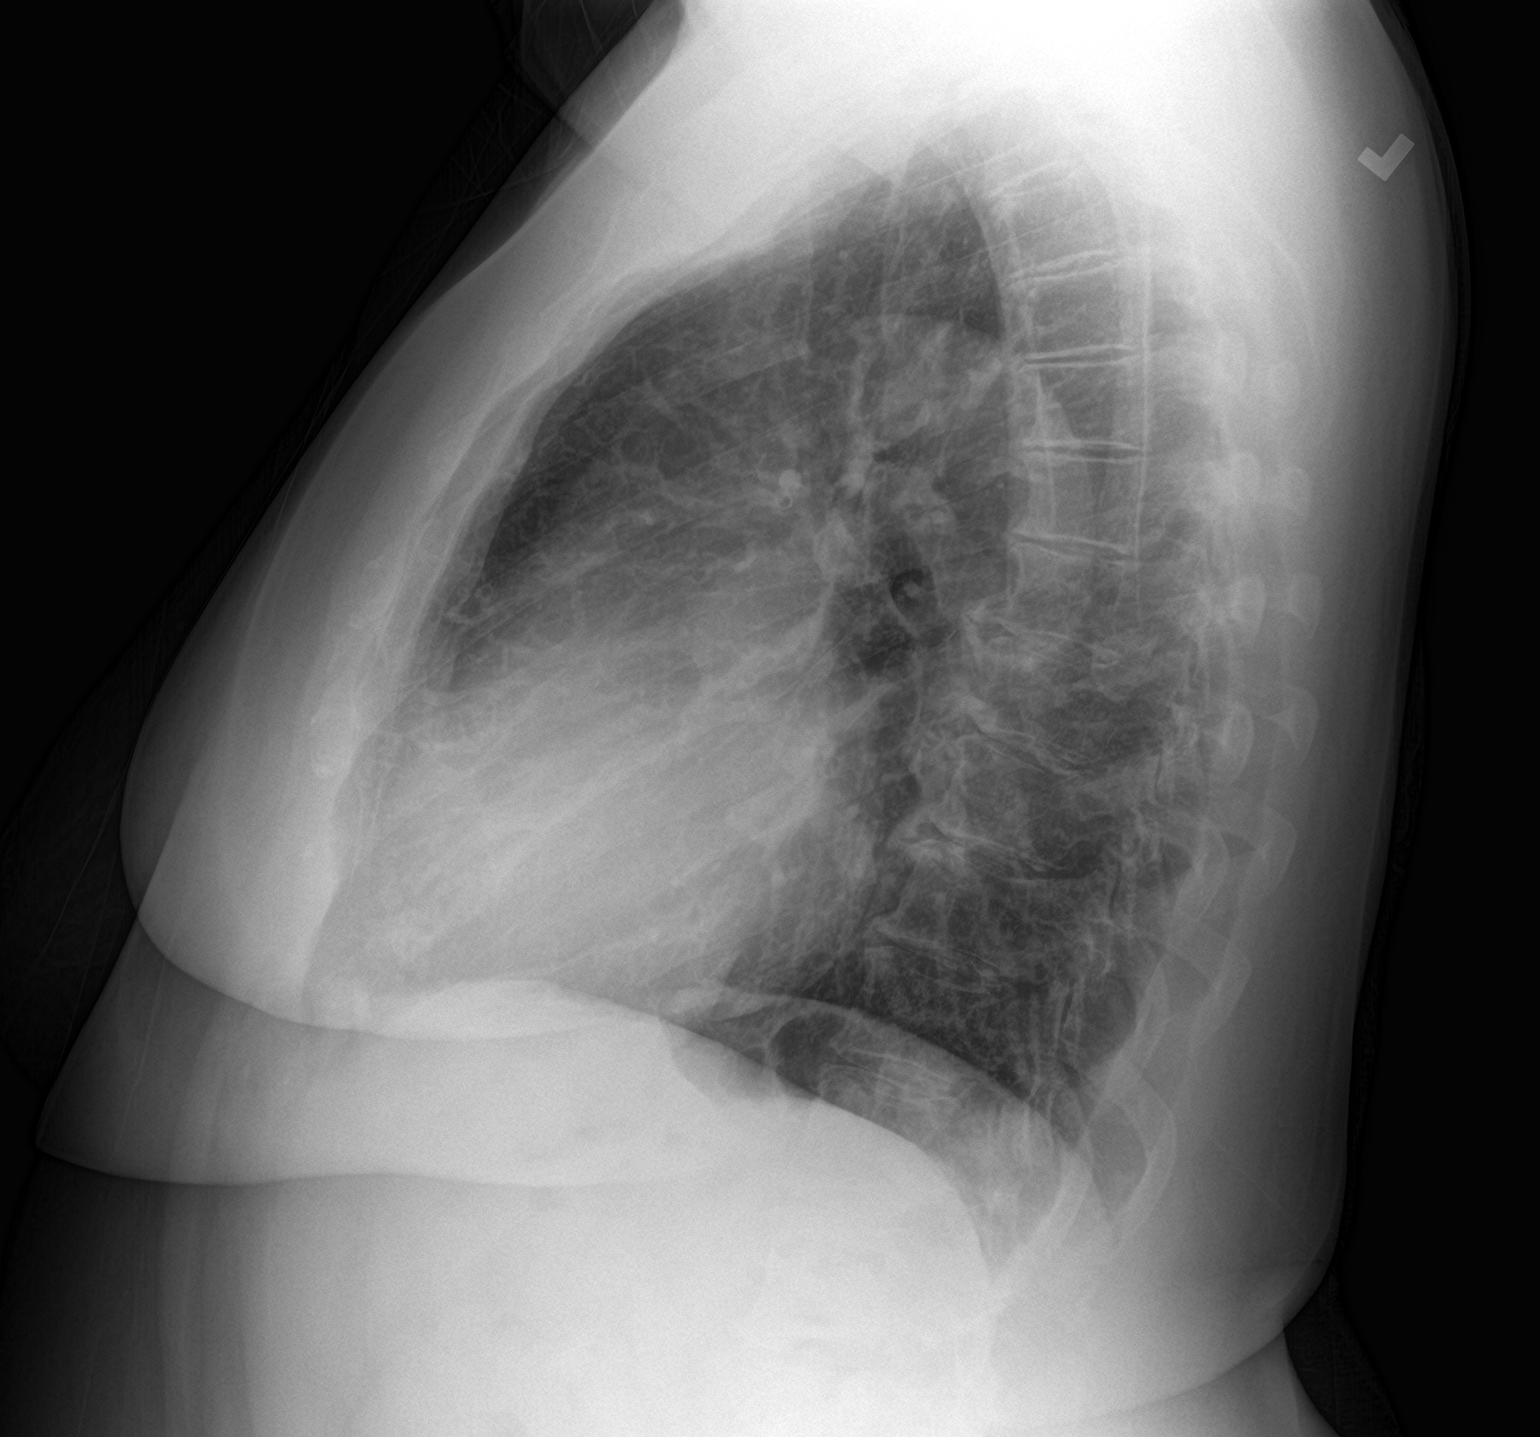

[chest pa]
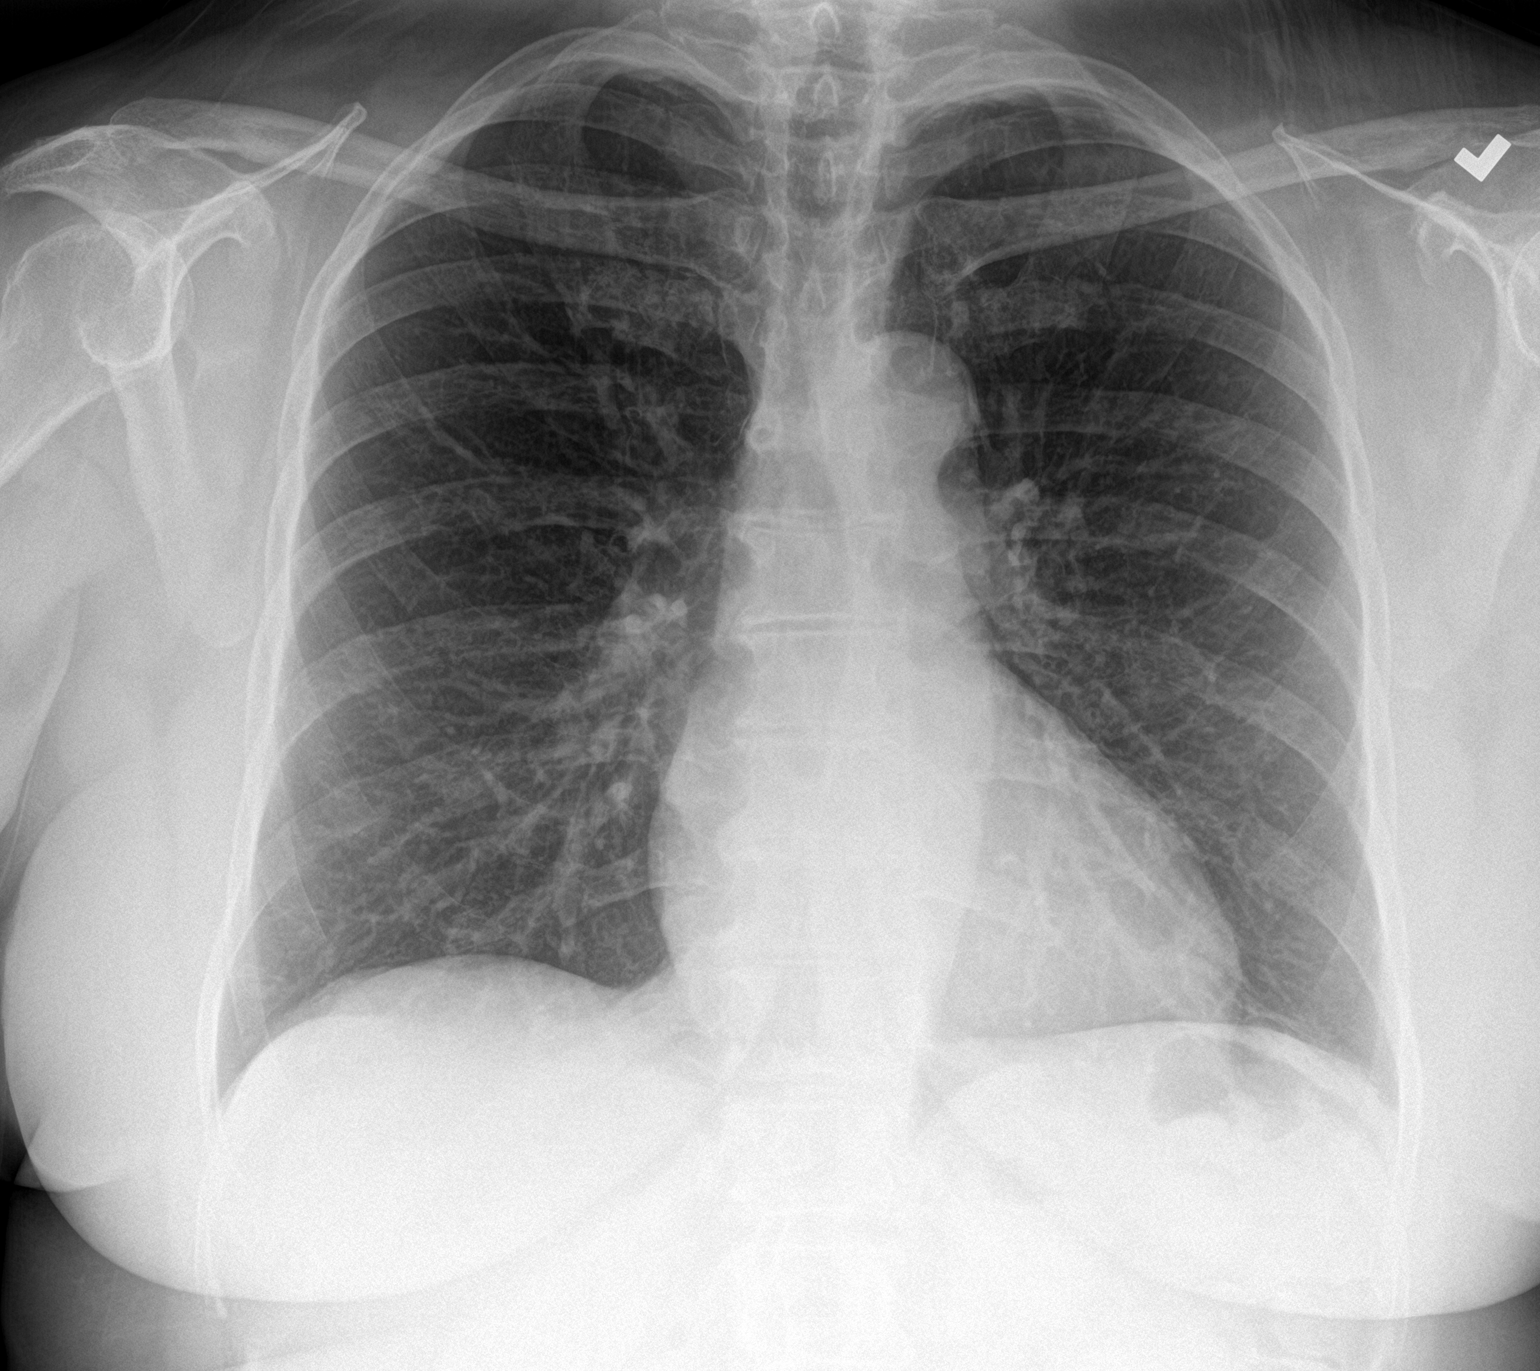

[2 of 2 positions shown; findings below may reference images not displayed]

FINDINGS: The heart size and mediastinal contours are within normal limits.
Both lungs are clear. The visualized skeletal structures are
unremarkable.
IMPRESSION: No active cardiopulmonary disease.

## 2021-09-15 ENCOUNTER — Encounter (INDEPENDENT_AMBULATORY_CARE_PROVIDER_SITE_OTHER): Payer: Self-pay

## 2021-09-17 ENCOUNTER — Other Ambulatory Visit: Payer: Self-pay | Admitting: Internal Medicine

## 2021-09-17 ENCOUNTER — Other Ambulatory Visit (HOSPITAL_BASED_OUTPATIENT_CLINIC_OR_DEPARTMENT_OTHER): Payer: Self-pay

## 2021-09-17 MED ORDER — FLUTICASONE-SALMETEROL 250-50 MCG/ACT IN AEPB
INHALATION_SPRAY | RESPIRATORY_TRACT | 11 refills | Status: DC
  Filled 2021-09-17: qty 60, 30d supply, fill #0
  Filled 2021-10-21: qty 60, 30d supply, fill #1
  Filled 2021-11-22: qty 60, 30d supply, fill #2
  Filled 2021-12-31: qty 60, 30d supply, fill #3
  Filled 2022-03-02: qty 60, 30d supply, fill #4

## 2021-09-28 ENCOUNTER — Other Ambulatory Visit (HOSPITAL_BASED_OUTPATIENT_CLINIC_OR_DEPARTMENT_OTHER): Payer: Self-pay | Admitting: Internal Medicine

## 2021-09-28 DIAGNOSIS — Z1231 Encounter for screening mammogram for malignant neoplasm of breast: Secondary | ICD-10-CM

## 2021-10-21 ENCOUNTER — Other Ambulatory Visit (HOSPITAL_BASED_OUTPATIENT_CLINIC_OR_DEPARTMENT_OTHER): Payer: Self-pay

## 2021-10-21 ENCOUNTER — Other Ambulatory Visit: Payer: Self-pay | Admitting: Internal Medicine

## 2021-10-21 MED ORDER — ALBUTEROL SULFATE HFA 108 (90 BASE) MCG/ACT IN AERS
2.0000 | INHALATION_SPRAY | RESPIRATORY_TRACT | 5 refills | Status: DC | PRN
  Filled 2021-10-21: qty 6.7, 17d supply, fill #0
  Filled 2021-11-22: qty 6.7, 17d supply, fill #1
  Filled 2021-12-31: qty 6.7, 17d supply, fill #2

## 2021-11-22 ENCOUNTER — Encounter (HOSPITAL_BASED_OUTPATIENT_CLINIC_OR_DEPARTMENT_OTHER): Payer: Self-pay

## 2021-11-22 ENCOUNTER — Other Ambulatory Visit: Payer: Self-pay | Admitting: Internal Medicine

## 2021-11-22 ENCOUNTER — Ambulatory Visit (HOSPITAL_BASED_OUTPATIENT_CLINIC_OR_DEPARTMENT_OTHER)
Admission: RE | Admit: 2021-11-22 | Discharge: 2021-11-22 | Disposition: A | Discharge status: Home / Self Care | Payer: No Typology Code available for payment source | Source: Ambulatory Visit | Attending: Internal Medicine | Admitting: Internal Medicine

## 2021-11-22 ENCOUNTER — Other Ambulatory Visit (HOSPITAL_BASED_OUTPATIENT_CLINIC_OR_DEPARTMENT_OTHER): Payer: Self-pay

## 2021-11-22 DIAGNOSIS — Z1231 Encounter for screening mammogram for malignant neoplasm of breast: Secondary | ICD-10-CM | POA: Diagnosis present

## 2021-11-22 MED ORDER — LOSARTAN POTASSIUM 100 MG PO TABS
100.0000 mg | ORAL_TABLET | Freq: Every day | ORAL | 2 refills | Status: DC
  Filled 2021-11-22: qty 90, 90d supply, fill #0
  Filled 2022-03-03: qty 90, 90d supply, fill #1

## 2021-11-22 MED ORDER — FLUCELVAX QUADRIVALENT 0.5 ML IM SUSY
PREFILLED_SYRINGE | INTRAMUSCULAR | 0 refills | Status: DC
  Filled 2021-11-22: qty 0.5, 1d supply, fill #0

## 2021-11-22 MED ORDER — AMLODIPINE BESYLATE 5 MG PO TABS
5.0000 mg | ORAL_TABLET | Freq: Every day | ORAL | 2 refills | Status: DC
  Filled 2021-11-22: qty 90, 90d supply, fill #0
  Filled 2022-03-02: qty 90, 90d supply, fill #1

## 2021-11-22 MED ORDER — HYDROCHLOROTHIAZIDE 12.5 MG PO TABS
12.5000 mg | ORAL_TABLET | Freq: Every day | ORAL | 2 refills | Status: DC
  Filled 2021-11-22: qty 90, 90d supply, fill #0
  Filled 2022-03-03: qty 90, 90d supply, fill #1

## 2021-12-06 ENCOUNTER — Other Ambulatory Visit (HOSPITAL_BASED_OUTPATIENT_CLINIC_OR_DEPARTMENT_OTHER): Payer: Self-pay

## 2021-12-31 ENCOUNTER — Other Ambulatory Visit (HOSPITAL_BASED_OUTPATIENT_CLINIC_OR_DEPARTMENT_OTHER): Payer: Self-pay

## 2022-01-06 ENCOUNTER — Other Ambulatory Visit (HOSPITAL_COMMUNITY): Payer: Self-pay

## 2022-01-06 MED ORDER — CLARITIN-D 24 HOUR 10-240 MG PO TB24
1.0000 | ORAL_TABLET | Freq: Every day | ORAL | 0 refills | Status: DC
  Filled 2022-01-06: qty 7, 7d supply, fill #0
  Filled 2022-01-17: qty 15, 15d supply, fill #0

## 2022-01-06 MED ORDER — HYDROCODONE-ACETAMINOPHEN 5-325 MG PO TABS
1.0000 | ORAL_TABLET | ORAL | 0 refills | Status: DC | PRN
  Filled 2022-01-06: qty 8, 2d supply, fill #0

## 2022-01-06 MED ORDER — AMOXICILLIN 500 MG PO CAPS
500.0000 mg | ORAL_CAPSULE | Freq: Two times a day (BID) | ORAL | 0 refills | Status: DC
  Filled 2022-01-06: qty 14, 7d supply, fill #0

## 2022-01-14 ENCOUNTER — Other Ambulatory Visit (HOSPITAL_BASED_OUTPATIENT_CLINIC_OR_DEPARTMENT_OTHER): Payer: Self-pay

## 2022-01-17 ENCOUNTER — Other Ambulatory Visit (HOSPITAL_BASED_OUTPATIENT_CLINIC_OR_DEPARTMENT_OTHER): Payer: Self-pay

## 2022-01-17 MED ORDER — AMOXICILLIN 500 MG PO CAPS
500.0000 mg | ORAL_CAPSULE | Freq: Three times a day (TID) | ORAL | 0 refills | Status: DC
  Filled 2022-01-17: qty 21, 7d supply, fill #0

## 2022-01-17 MED ORDER — HYDROCODONE-ACETAMINOPHEN 5-325 MG PO TABS
1.0000 | ORAL_TABLET | ORAL | 0 refills | Status: DC | PRN
  Filled 2022-01-17: qty 12, 2d supply, fill #0

## 2022-01-20 ENCOUNTER — Other Ambulatory Visit (HOSPITAL_BASED_OUTPATIENT_CLINIC_OR_DEPARTMENT_OTHER): Payer: Self-pay

## 2022-01-20 MED ORDER — HYDROCODONE-ACETAMINOPHEN 5-325 MG PO TABS
ORAL_TABLET | ORAL | 0 refills | Status: DC
  Filled 2022-01-20: qty 20, 3d supply, fill #0

## 2022-01-20 MED ORDER — AMOXICILLIN 500 MG PO CAPS
500.0000 mg | ORAL_CAPSULE | Freq: Two times a day (BID) | ORAL | 0 refills | Status: DC
  Filled 2022-01-20: qty 14, 7d supply, fill #0

## 2022-01-27 ENCOUNTER — Other Ambulatory Visit (HOSPITAL_COMMUNITY): Payer: Self-pay

## 2022-01-27 ENCOUNTER — Other Ambulatory Visit (HOSPITAL_BASED_OUTPATIENT_CLINIC_OR_DEPARTMENT_OTHER): Payer: Self-pay

## 2022-01-27 MED ORDER — CHLORHEXIDINE GLUCONATE 0.12 % MT SOLN
15.0000 mL | Freq: Two times a day (BID) | OROMUCOSAL | 2 refills | Status: DC
  Filled 2022-01-27 (×2): qty 473, 16d supply, fill #0

## 2022-01-27 MED ORDER — AMOXICILLIN 500 MG PO CAPS
500.0000 mg | ORAL_CAPSULE | Freq: Two times a day (BID) | ORAL | 0 refills | Status: DC
  Filled 2022-01-27 (×2): qty 20, 10d supply, fill #0

## 2022-01-27 MED ORDER — HYDROCODONE-ACETAMINOPHEN 5-325 MG PO TABS
1.0000 | ORAL_TABLET | ORAL | 0 refills | Status: DC | PRN
  Filled 2022-01-27: qty 20, 4d supply, fill #0

## 2022-01-27 MED ORDER — HYDROCODONE-ACETAMINOPHEN 5-325 MG PO TABS
1.0000 | ORAL_TABLET | ORAL | 0 refills | Status: DC
  Filled 2022-01-27: qty 20, 4d supply, fill #0

## 2022-02-02 ENCOUNTER — Encounter: Payer: Self-pay | Admitting: Obstetrics & Gynecology

## 2022-02-02 ENCOUNTER — Ambulatory Visit (INDEPENDENT_AMBULATORY_CARE_PROVIDER_SITE_OTHER): Payer: No Typology Code available for payment source | Admitting: Obstetrics & Gynecology

## 2022-02-02 VITALS — BP 138/52 | HR 67 | Ht 65.0 in | Wt 216.8 lb

## 2022-02-02 DIAGNOSIS — Z01419 Encounter for gynecological examination (general) (routine) without abnormal findings: Secondary | ICD-10-CM

## 2022-02-02 DIAGNOSIS — N951 Menopausal and female climacteric states: Secondary | ICD-10-CM | POA: Diagnosis not present

## 2022-02-02 NOTE — Progress Notes (Signed)
Subjective:     Brenda Russo is a 66 y.o. female here for a routine exam.  Current complaints: none. Pt was recently;y in [place]. She will retire in Jan 2024.      Gynecologic History No LMP recorded. Patient is postmenopausal. Contraception: post menopausal status Last Pap: 01/27/2020. Results were: normal Last mammogram: 11/22/2021. Results were: normal  Obstetric History OB History  Gravida Para Term Preterm AB Living  '3 3 3     3  '$ SAB IAB Ectopic Multiple Live Births          3    # Outcome Date GA Lbr Len/2nd Weight Sex Delivery Anes PTL Lv  3 Term 1980 [redacted]w[redacted]d  F CS-LTranv Spinal N LIV  2 Term 1978 447w0d F CS-LTranv EPI N LIV  1 Term [id number] Vag-Spont None N LIV     The following portions of the patient's history were reviewed and updated as appropriate: allergies, current medications, past family history, past medical history, past social history, past surgical history, and problem list.  Review of Systems Pertinent items are noted in HPI.    Objective:  BP (!) 138/52   Pulse 67   Ht '5\' 5"'$  (1.651 m)   Wt 216 lb 12.8 oz (98.3 kg)   BMI 36.08 kg/m   General Appearance:    Alert, cooperative, no distress, appears stated age  Head:    Normocephalic, without obvious abnormality, atraumatic  Eyes:    conjunctiva/corneas clear, EOM's intact, both eyes  Ears:    Normal external ear canals, both ears  Nose:   Nares normal, septum midline, mucosa normal, no drainage    or sinus tenderness  Throat:   Lips, mucosa, and tongue normal; teeth and gums normal  Neck:   Supple, symmetrical, trachea midline, no adenopathy;    thyroid:  no enlargement/tenderness/nodules  Back:     Symmetric, no curvature, ROM normal, no CVA tenderness  Lungs:     respirations unlabored  Chest Wall:    No tenderness or deformity   Heart:    Regular rate and rhythm  Breast Exam:    No tenderness, masses, or nipple abnormality  Abdomen:     Soft, non-tender, bowel sounds  active all four quadrants,    no masses, no organomegaly  Genitalia:    Normal female without lesion, discharge or tenderness   No masses noted. Uteus small and mobile.   Extremities:   Extremities normal, atraumatic, no cyanosis or edema  Pulses:   2+ and symmetric all extremities  Skin:   Skin color, texture, turgor normal, no rashes or lesions     Assessment:    Healthy female exam.  Post menopausal state.    Plan:   Pt is UTD with all of her annual screening tests F/u in 1 year or sooner prn   Gjon Letarte L. Harraway-Smith, M.D., FACCherlynn June

## 2022-02-08 ENCOUNTER — Other Ambulatory Visit (HOSPITAL_COMMUNITY): Payer: Self-pay

## 2022-02-08 MED ORDER — CHLORHEXIDINE GLUCONATE 0.12 % MT SOLN
15.0000 mL | Freq: Two times a day (BID) | OROMUCOSAL | 3 refills | Status: DC
  Filled 2022-02-08: qty 473, 15d supply, fill #0
  Filled 2022-03-03: qty 473, 15d supply, fill #1

## 2022-02-08 MED ORDER — AMOXICILLIN-POT CLAVULANATE 875-125 MG PO TABS
1.0000 | ORAL_TABLET | Freq: Two times a day (BID) | ORAL | 0 refills | Status: DC
  Filled 2022-02-08: qty 28, 14d supply, fill #0

## 2022-02-09 ENCOUNTER — Other Ambulatory Visit (HOSPITAL_COMMUNITY): Payer: Self-pay

## 2022-02-17 ENCOUNTER — Other Ambulatory Visit (HOSPITAL_COMMUNITY): Payer: Self-pay

## 2022-02-17 MED ORDER — HYDROCODONE-ACETAMINOPHEN 5-325 MG PO TABS
1.0000 | ORAL_TABLET | ORAL | 0 refills | Status: DC | PRN
  Filled 2022-02-17: qty 20, 4d supply, fill #0

## 2022-02-28 ENCOUNTER — Other Ambulatory Visit (HOSPITAL_COMMUNITY): Payer: Self-pay

## 2022-03-02 ENCOUNTER — Other Ambulatory Visit (HOSPITAL_COMMUNITY): Payer: Self-pay

## 2022-03-03 ENCOUNTER — Other Ambulatory Visit (HOSPITAL_COMMUNITY): Payer: Self-pay

## 2022-04-01 ENCOUNTER — Telehealth: Payer: Self-pay | Admitting: Internal Medicine

## 2022-04-01 ENCOUNTER — Other Ambulatory Visit (HOSPITAL_COMMUNITY): Payer: Self-pay

## 2022-04-01 MED ORDER — FLUTICASONE FUROATE-VILANTEROL 200-25 MCG/ACT IN AEPB: 1.0000 | INHALATION_SPRAY | Freq: Every day | RESPIRATORY_TRACT | 3 refills | Status: DC

## 2022-04-01 NOTE — Telephone Encounter (Signed)
Called and spoke with pt letting her know that Dr. Melvyn Novas was fine with her switching back to [facility] and she verbalized understanding. Verified preferred pharmacy and sent Rx in for pt. Nothing further needed.

## 2022-04-01 NOTE — Telephone Encounter (Signed)
Patient called to request a different medication.  She would like to take the [facility] that she was taking before.  She stated that the Advair is not working for her.  Please call to discuss further.  CB# [phone]

## 2022-04-01 NOTE — Addendum Note (Signed)
Addended by: Lorretta Harp on: 04/01/2022 11:25 AM   Modules accepted: Orders

## 2022-04-01 NOTE — Telephone Encounter (Signed)
Fine with me but remind her it's only one click each am

## 2022-04-01 NOTE — Telephone Encounter (Signed)
Spoke with patient she states she would like to switch back to [facility]. She has new insurance and it cost the same amount as Advair. She is requesting a 90 day supply [facility]. She states insurance will cover this. Dr. Melvyn Novas please advise if this is okay

## 2022-04-25 ENCOUNTER — Telehealth: Payer: Self-pay | Admitting: Internal Medicine

## 2022-04-25 ENCOUNTER — Other Ambulatory Visit (HOSPITAL_BASED_OUTPATIENT_CLINIC_OR_DEPARTMENT_OTHER): Payer: Self-pay

## 2022-04-25 NOTE — Telephone Encounter (Signed)
PT calling for FU w/Dr. Melvyn Novas but nothing avail in [facility] that I could find. Is it OK for her to change to a Dr. At the [facility] location(s)? Pls call to advise. TY.

## 2022-04-26 NOTE — Telephone Encounter (Signed)
Dr Melvyn Novas does not work in the [facility] location. Pt has office visit with Dr Melvyn Novas coming up in May it looks like. Nothing further needed

## 2022-05-23 ENCOUNTER — Encounter: Payer: Self-pay | Admitting: *Deleted

## 2022-06-07 ENCOUNTER — Other Ambulatory Visit (HOSPITAL_BASED_OUTPATIENT_CLINIC_OR_DEPARTMENT_OTHER): Payer: Self-pay

## 2022-06-07 ENCOUNTER — Ambulatory Visit (INDEPENDENT_AMBULATORY_CARE_PROVIDER_SITE_OTHER): Payer: Medicare Other | Admitting: Family

## 2022-06-07 VITALS — BP 136/71 | HR 59 | Temp 98.1°F | Resp 16 | Wt 220.0 lb

## 2022-06-07 DIAGNOSIS — M549 Dorsalgia, unspecified: Secondary | ICD-10-CM | POA: Insufficient documentation

## 2022-06-07 MED ORDER — MELOXICAM 7.5 MG PO TABS
7.5000 mg | ORAL_TABLET | Freq: Every day | ORAL | 0 refills | Status: DC
  Filled 2022-06-07: qty 14, 14d supply, fill #0

## 2022-06-07 NOTE — Assessment & Plan Note (Signed)
New. Recommended a trial of meloxicam once daily short term and application of topical salon pas patches prn. She is advised to call if she develops new or worsening symptoms or if symptoms fail to improve.

## 2022-06-07 NOTE — Progress Notes (Signed)
Subjective:   By signing my name below, I, Carlena Bjornstad, attest that this documentation has been prepared under the direction and in the presence of Sandford Craze, NP.  06/07/2022.   Patient ID: CALEN POSCH, female    DOB: 1956/01/01, 67 y.o.   MRN: [id number]  Chief Complaint  Patient presents with   Flank Pain    Patient complains of right flank pain since last Friday.    Arm Pain    Complains of both arms hurting "some" in last few days    HPI Patient is in today for an office visit.  Right-sided flank pain:  Last Friday she developed some right flank pain in the setting of regular housework such as changing sheets. This progressed and worsened on Friday to the point of limiting her movement. Currently her pain has been waning and she is able to move freely. However she still feels very sore with movement, walking a few steps, or coughing. She also complains of some soreness with palpation that was worse on Friday. She denies any fever. No prior back issues.  Activity:  Last Wednesday and Thursday she did play pickleball, but she denies experiencing any pain on those days.   Past Medical History:  Diagnosis Date   Allergic rhinitis    Allergy    Anemia    Anxiety    Asthma    HTN (hypertension)    Hyperlipidemia    borderline   Obese    OSA (obstructive sleep apnea)    mild ,no CPAP   Overweight(278.02)    Plantar wart    history   Prediabetes    Sleep apnea     Past Surgical History:  Procedure Laterality Date   BREAST BIOPSY Right 09/02/2015   CESAREAN SECTION     COLONOSCOPY  11/05/2018   COLONOSCOPY  2010   TUBAL LIGATION      Family History  Problem Relation Age of Onset   Heart disease Mother        pacemaker    Hypertension Mother    Hyperlipidemia Mother    Obesity Mother    Asthma Father    Pancreatic cancer Brother    Breast cancer Maternal Aunt    Colon cancer Neg Hx    Esophageal cancer Neg Hx    Stomach cancer Neg Hx     Rectal cancer Neg Hx     Social History   Socioeconomic History   Marital status: Married    Spouse name: Not on file   Number of children: 3   Years of education: Not on file   Highest education level: Not on file  Occupational History   Occupation: Secondary school teacher at [employer]    Employer: [employer]    Comment: first shift  Tobacco Use   Smoking status: Never   Smokeless tobacco: Never  Substance and Sexual Activity   Alcohol use: No   Drug use: No   Sexual activity: Yes  Other Topics Concern   Not on file  Social History Narrative   3 biological children   1 adopted child   3 step-children   Household-- pt , husband and a son   Social Determinants of Corporate investment banker Strain: Not on file  Food Insecurity: Not on file  Transportation Needs: Not on file  Physical Activity: Not on file  Stress: Not on file  Social Connections: Not on file  Intimate Partner Violence: Not on file  Outpatient Medications Prior to Visit  Medication Sig Dispense Refill   albuterol (VENTOLIN HFA) 108 (90 Base) MCG/ACT inhaler Inhale 2 puffs by mouth into the lungs every 4 (four) hours as needed for wheezing or shortness of breath. 6.7 g 5   amLODipine (NORVASC) 5 MG tablet Take 1 tablet (5 mg total) by mouth daily. 90 tablet 2   ciclopirox (PENLAC) 8 % solution Apply topically at bedtime. Apply over nail and surrounding skin. Apply daily over previous coat. After seven (7) days, may remove with alcohol and continue cycle. 6.6 mL 2   Coenzyme Q10 (COQ10) 100 MG CAPS Take 1 capsule by mouth daily.     Ergocalciferol 2500 units CAPS Take 5,000 Units by mouth daily. 30 capsule 0   fluticasone (FLONASE) 50 MCG/ACT nasal spray Place 2 sprays into the nose daily as needed.     fluticasone furoate-vilanterol (BREO ELLIPTA) 200-25 MCG/ACT AEPB Inhale 1 puff into the lungs daily. 180 each 3   hydrochlorothiazide (HYDRODIURIL) 12.5 MG tablet Take 1 tablet (12.5 mg total) by mouth daily.  90 tablet 2   influenza vaccine (FLUCELVAX QUADRIVALENT) 0.5 ML injection Inject into the muscle. 0.5 mL 0   loratadine-pseudoephedrine (CLARITIN-D 24 HOUR) 10-240 MG 24 hr tablet Take 1 tablet by mouth daily. 15 tablet 0   losartan (COZAAR) 100 MG tablet Take 1 tablet (100 mg total) by mouth daily. 90 tablet 2   Magnesium 200 MG TABS Take by mouth.     Multiple Minerals-Vitamins (CALCIUM CITRATE PLUS/MAGNESIUM) TABS Take 1 tablet by mouth.     Omega-3 Fatty Acids (FISH OIL) 1000 MG CAPS Take 1 capsule by mouth.     amoxicillin (AMOXIL) 500 MG capsule Take 1 capsule (500 mg total) by mouth in the morning and at bedtime. 14 capsule 0   amoxicillin (AMOXIL) 500 MG capsule Take 1 capsule (500 mg total) by mouth 2 (two) times daily. (Patient not taking: Reported on 02/02/2022) 20 capsule 0   amoxicillin-clavulanate (AUGMENTIN) 875-125 MG tablet Take 1 tablet by mouth every 12 (twelve) hours. 28 tablet 0   Blood Pressure Monitoring (BLOOD PRESSURE KIT) DEVI Check blood pressure 3-4 times weekly 1 each 0   chlorhexidine (PERIDEX) 0.12 % solution Swish 15 mLs in mouth for 30 seconds then spit out 2 (two) times daily after meals (Patient not taking: Reported on 02/02/2022) 473 mL 2   chlorhexidine (PERIDEX) 0.12 % solution Swish in mouth for 30 seconds twice a day after meals then spit out 473 mL 3   HYDROcodone-acetaminophen (NORCO/VICODIN) 5-325 MG tablet Take 1 tablet by mouth every 4-6 hours as needed for pain. 12 tablet 0   HYDROcodone-acetaminophen (NORCO/VICODIN) 5-325 MG tablet Take 1 tablet by mouth every 4-6 hours as needed for pain. 20 tablet 0   HYDROcodone-acetaminophen (NORCO/VICODIN) 5-325 MG tablet Take 1 tablet by mouth every 4-6 hours as needed for pain. 20 tablet 0   HYDROcodone-acetaminophen (NORCO/VICODIN) 5-325 MG tablet Take 1 tablet by mouth every 4-6 hours as needed for pain 20 tablet 0   HYDROcodone-acetaminophen (NORCO/VICODIN) 5-325 MG tablet Take 1 tablet by mouth every  4-6 hours as needed for pain 20 tablet 0   No facility-administered medications prior to visit.    Allergies  Allergen Reactions   Egg-Derived Products     Shortness of breath, wheezing    Review of Systems  Genitourinary:  Positive for flank pain (Right).    See HPI.     Objective:    Physical Exam Constitutional:  Appearance: Normal appearance.  HENT:     Head: Normocephalic and atraumatic.     Right Ear: Tympanic membrane, ear canal and external ear normal.     Left Ear: Tympanic membrane, ear canal and external ear normal.  Eyes:     Extraocular Movements: Extraocular movements intact.     Pupils: Pupils are equal, round, and reactive to light.  Cardiovascular:     Rate and Rhythm: Normal rate and regular rhythm.     Heart sounds: Normal heart sounds. No murmur heard.    No gallop.  Pulmonary:     Effort: Pulmonary effort is normal. No respiratory distress.     Breath sounds: Normal breath sounds. No wheezing or rales.  Abdominal:     Tenderness: There is no right CVA tenderness or left CVA tenderness.  Musculoskeletal:     Comments: Some tenderness to palpation overlying right flank area. No skin rash.  Skin:    General: Skin is warm and dry.  Neurological:     General: No focal deficit present.     Mental Status: She is alert and oriented to person, place, and time.  Psychiatric:        Mood and Affect: Mood normal.        Behavior: Behavior normal.     BP 136/71 (BP Location: Right Arm, Patient Position: Sitting, Cuff Size: Large)   Pulse (!) 59   Temp 98.1 F (36.7 C) (Oral)   Resp 16   Wt 220 lb (99.8 kg)   SpO2 99%   BMI 36.61 kg/m  Wt Readings from Last 3 Encounters:  06/07/22 220 lb (99.8 kg)  02/02/22 216 lb 12.8 oz (98.3 kg)  08/06/21 218 lb 8 oz (99.1 kg)      Assessment & Plan:   Problem List Items Addressed This Visit       Unprioritized   Musculoskeletal back pain - Primary    New. Recommended a trial of meloxicam once  daily short term and application of topical salon pas patches prn. She is advised to call if she develops new or worsening symptoms or if symptoms fail to improve.       Relevant Medications   meloxicam (MOBIC) 7.5 MG tablet     Meds ordered this encounter  Medications   meloxicam (MOBIC) 7.5 MG tablet    Sig: Take 1 tablet (7.5 mg total) by mouth daily.    Dispense:  14 tablet    Refill:  0    Order Specific Question:   Supervising Provider    Answer:   Danise Edge A [[id number]]    I, Lemont Fillers, NP, personally preformed the services described in this documentation.  All medical record entries made by the scribe were at my direction and in my presence.  I have reviewed the chart and discharge instructions (if applicable) and agree that the record reflects my personal performance and is accurate and complete. 06/07/2022.  I,Mathew Stumpf,acting as a Neurosurgeon for [employer], NP.,have documented all relevant documentation on the behalf of Lemont Fillers, NP,as directed by  Lemont Fillers, NP while in the presence of Lemont Fillers, NP.   Lemont Fillers, NP

## 2022-06-29 ENCOUNTER — Ambulatory Visit: Payer: 59 | Admitting: Internal Medicine

## 2022-07-03 NOTE — Progress Notes (Unsigned)
Brenda Russo, female    DOB: 11-12-1955    MRN: [id number]   Brief patient profile:  62  yobf never smoker/ born prematurely but able to keep up with other kids work  but asthma as child and never outgrew prev under Dr Jodelle Green care and maintained on BREO 200 one daily with dx of moderate chronic asthma referred herself to me to establish for pulmonary f/u for asthma    History of Present Illness  11/18/2019  Pulmonary/ 1st office eval/Brenda Russo unhappy with cost of breo 200  Chief Complaint  Patient presents with   Follow-up    Denies any problems with breathing  Dyspnea:  Not limited by breathing from desired activities  -no regular aerobics or steps Cough: none  Sleep: no problem at 10 degrees  SABA use: none wixella 250 one puff first thing in am then repeat in 12 hours   06/28/2021  f/u ov/Brenda Russo re: mod asthma  maint on wixella  250  Chief Complaint  Patient presents with   Follow-up    Breathing is about the same. She states some days she feels a pressure in her chest- depends on the pollen. She rarely uses her albuterol inhaler.    Dyspnea:  short jog 500 ft to neighbor's house  Cough: not a problem Sleeping: ok 10 degrees  SABA use: rarely  02: none Rec No change in medications for now  Only use your albuterol as a rescue medication  Also Ok to try albuterol 15 min before an activity (on alternating days)  that you know would usually make you short of breath    07/05/2022  f/u ov/Brenda Russo re:  asthma since childhood maint on  Breo 200   Chief Complaint  Patient presents with   Follow-up    Breo working well.  Runny nose with allergies.  Dyspnea:  playing pickle ball  Cough: none  Sleeping: 10 degrees elevation  SABA use: rarely  02: none      No obvious day to day or daytime variability or assoc excess/ purulent sputum or mucus plugs or hemoptysis or cp or chest tightness, subjective wheeze or overt sinus or hb symptoms.   Sleeping ok as above without nocturnal   or early am exacerbation  of respiratory  c/o's or need for noct saba. Also denies any obvious fluctuation of symptoms with weather or environmental changes or other aggravating or alleviating factors except as outlined above   No unusual exposure hx or h/o childhood pna  or knowledge of premature birth.  Current Allergies, Complete Past Medical History, Past Surgical History, Family History, and Social History were reviewed in [employer] [facility] record.  ROS  The following are not active complaints unless bolded Hoarseness, sore throat, dysphagia, dental problems, itching, sneezing,  nasal congestion or discharge of excess mucus/watery  or purulent secretions, ear ache,   fever, chills, sweats, unintended wt loss or wt gain, classically pleuritic or exertional cp,  orthopnea pnd or arm/hand swelling  or leg swelling, presyncope, palpitations, abdominal pain, anorexia, nausea, vomiting, diarrhea  or change in bowel habits or change in bladder habits, change in stools or change in urine, dysuria, hematuria,  rash, arthralgias, visual complaints, headache, numbness, weakness or ataxia or problems with walking or coordination,  change in mood or  memory.        Current Meds  Medication Sig   albuterol (VENTOLIN HFA) 108 (90 Base) MCG/ACT inhaler Inhale 2 puffs by mouth into the lungs every 4 (  four) hours as needed for wheezing or shortness of breath.   amLODipine (NORVASC) 5 MG tablet Take 1 tablet (5 mg total) by mouth daily.   Coenzyme Q10 (COQ10) 100 MG CAPS Take 1 capsule by mouth daily.   Ergocalciferol 2500 units CAPS Take 5,000 Units by mouth daily.   fluticasone (FLONASE) 50 MCG/ACT nasal spray Place 2 sprays into the nose daily as needed.   fluticasone furoate-vilanterol (BREO ELLIPTA) 200-25 MCG/ACT AEPB Inhale 1 puff into the lungs daily.   hydrochlorothiazide (HYDRODIURIL) 12.5 MG tablet Take 1 tablet (12.5 mg total) by mouth daily.   loratadine-pseudoephedrine  (CLARITIN-D 24 HOUR) 10-240 MG 24 hr tablet Take 1 tablet by mouth daily.   losartan (COZAAR) 100 MG tablet Take 1 tablet (100 mg total) by mouth daily.   Magnesium 200 MG TABS Take by mouth.   meloxicam (MOBIC) 7.5 MG tablet Take 1 tablet (7.5 mg total) by mouth daily.   Multiple Minerals-Vitamins (CALCIUM CITRATE PLUS/MAGNESIUM) TABS Take 1 tablet by mouth.   Omega-3 Fatty Acids (FISH OIL) 1000 MG CAPS Take 1 capsule by mouth.              Past Medical History:  Diagnosis Date   Allergic rhinitis    Allergy    Anemia    Anxiety    Asthma    HTN (hypertension)    Hyperlipidemia    borderline   Obese    OSA (obstructive sleep apnea)    mild ,no CPAP   Overweight(278.02)    Plantar wart    history   Prediabetes    Sleep apnea         Objective:    Wts  07/05/2022       218   06/28/2021      215   06/15/20 218 lb (98.9 kg)  04/07/20 216 lb (98 kg)  02/06/20 221 lb 6 oz (100.4 kg)    Vital signs reviewed  07/05/2022  - Note at rest 02 sats  97% on RA   General appearance:    amb pleasant bf nad    HEENT : Oropharynx  clear/ no thrush     Nasal turbinates mild edema/ no polyps   NECK :  without  apparent JVD/ palpable Nodes/TM    LUNGS: no acc muscle use,  Nl contour chest which is clear to A and P bilaterally without cough on insp or exp maneuvers   CV:  RRR  no s3 or murmur or increase in P2, and no edema   ABD:  soft and nontender with nl inspiratory excursion in the supine position. No bruits or organomegaly appreciated   MS:  Nl gait/ ext warm without deformities Or obvious joint restrictions  calf tenderness, cyanosis or clubbing    SKIN: warm and dry without lesions    NEURO:  alert, approp, nl sensorium with  no motor or cerebellar deficits apparent.           Assessment

## 2022-07-05 ENCOUNTER — Ambulatory Visit: Payer: Medicare Other | Admitting: Internal Medicine

## 2022-07-05 ENCOUNTER — Encounter: Payer: Self-pay | Admitting: Internal Medicine

## 2022-07-05 VITALS — BP 142/86 | HR 71 | Temp 98.3°F | Ht 65.0 in | Wt 218.2 lb

## 2022-07-05 DIAGNOSIS — J4540 Moderate persistent asthma, uncomplicated: Secondary | ICD-10-CM | POA: Diagnosis not present

## 2022-07-05 MED ORDER — FLUTICASONE FUROATE-VILANTEROL 100-25 MCG/ACT IN AEPB: 1.0000 | INHALATION_SPRAY | Freq: Every morning | RESPIRATORY_TRACT | 3 refills | Status: DC

## 2022-07-05 NOTE — Patient Instructions (Addendum)
Try BREO  100 one click each am (ok to use up the 200 if you can't stop shipment   Only use your albuterol as a rescue medication to be used if you can't catch your breath by resting or doing a relaxed purse lip breathing pattern.  - The less you use it, the better it will work when you need it. - Ok to use up to 2 puffs  every 4 hours if you must but call for immediate appointment if use goes up over your usual need - Don't leave home without it !!  (think of it like the spare tire for your car)   [facility]  [id number]    Please schedule a follow up visit in 12  months but call sooner if needed

## 2022-07-05 NOTE — Assessment & Plan Note (Addendum)
Never smoker - Spirometry  12/24/15 FEV1 1.7 (76%)  Ratio 0.61 with classic concavity unknown whether p meds or not   - FENO  06/28/21   = 17 on wixella 250 bid > try BREO 100 daily   - The proper method of use, as well as anticipated side effects, of a dry powder/elipta   inhaler were discussed and demonstrated to the patient using teach back method.    All goals of chronic asthma control met including optimal function and elimination of symptoms with minimal need for rescue therapy on BREO 200 but note FENO 17 on wixella 250 so should be able to tol the BREO 100 to reduce cost and side effects   F/u can be q year, sooner if needed   Contingencies discussed in full including contacting this office immediately if not controlling the symptoms using the rule of two's.          Each maintenance medication was reviewed in detail including emphasizing most importantly the difference between maintenance and prns and under what circumstances the prns are to be triggered using an action plan format where appropriate.  Total time for H and P, chart review, counseling,  and generating customized AVS unique to this office visit / same day charting = 25 min

## 2022-07-06 DIAGNOSIS — H04123 Dry eye syndrome of bilateral lacrimal glands: Secondary | ICD-10-CM | POA: Diagnosis not present

## 2022-07-06 DIAGNOSIS — H43813 Vitreous degeneration, bilateral: Secondary | ICD-10-CM | POA: Diagnosis not present

## 2022-07-06 DIAGNOSIS — H25813 Combined forms of age-related cataract, bilateral: Secondary | ICD-10-CM | POA: Diagnosis not present

## 2022-07-06 DIAGNOSIS — H1045 Other chronic allergic conjunctivitis: Secondary | ICD-10-CM | POA: Diagnosis not present

## 2022-07-21 ENCOUNTER — Other Ambulatory Visit: Payer: Self-pay

## 2022-07-21 MED ORDER — HYDROCHLOROTHIAZIDE 12.5 MG PO TABS: 12.5000 mg | ORAL_TABLET | Freq: Every day | ORAL | 1 refills | Status: DC

## 2022-07-21 MED ORDER — AMLODIPINE BESYLATE 5 MG PO TABS: 5.0000 mg | ORAL_TABLET | Freq: Every day | ORAL | 1 refills | Status: DC

## 2022-07-21 MED ORDER — LOSARTAN POTASSIUM 100 MG PO TABS: 100.0000 mg | ORAL_TABLET | Freq: Every day | ORAL | 1 refills | Status: DC

## 2022-08-09 ENCOUNTER — Encounter: Payer: No Typology Code available for payment source | Admitting: Internal Medicine

## 2022-09-07 ENCOUNTER — Encounter (INDEPENDENT_AMBULATORY_CARE_PROVIDER_SITE_OTHER): Payer: Self-pay

## 2022-09-14 ENCOUNTER — Other Ambulatory Visit: Payer: Self-pay | Admitting: Internal Medicine

## 2022-10-06 ENCOUNTER — Other Ambulatory Visit: Payer: Self-pay

## 2022-10-07 ENCOUNTER — Ambulatory Visit (INDEPENDENT_AMBULATORY_CARE_PROVIDER_SITE_OTHER): Payer: Medicare Other | Admitting: Internal Medicine

## 2022-10-07 ENCOUNTER — Encounter: Payer: Self-pay | Admitting: Internal Medicine

## 2022-10-07 VITALS — BP 134/74 | HR 65 | Temp 98.1°F | Resp 16 | Ht 65.0 in | Wt 214.0 lb

## 2022-10-07 DIAGNOSIS — R748 Abnormal levels of other serum enzymes: Secondary | ICD-10-CM | POA: Diagnosis not present

## 2022-10-07 DIAGNOSIS — E669 Obesity, unspecified: Secondary | ICD-10-CM | POA: Diagnosis not present

## 2022-10-07 DIAGNOSIS — Z0001 Encounter for general adult medical examination with abnormal findings: Secondary | ICD-10-CM

## 2022-10-07 DIAGNOSIS — Z0000 Encounter for general adult medical examination without abnormal findings: Secondary | ICD-10-CM | POA: Diagnosis not present

## 2022-10-07 DIAGNOSIS — I493 Ventricular premature depolarization: Secondary | ICD-10-CM

## 2022-10-07 DIAGNOSIS — M1991 Primary osteoarthritis, unspecified site: Secondary | ICD-10-CM | POA: Diagnosis not present

## 2022-10-07 DIAGNOSIS — I10 Essential (primary) hypertension: Secondary | ICD-10-CM

## 2022-10-07 DIAGNOSIS — E559 Vitamin D deficiency, unspecified: Secondary | ICD-10-CM | POA: Diagnosis not present

## 2022-10-07 DIAGNOSIS — R739 Hyperglycemia, unspecified: Secondary | ICD-10-CM | POA: Diagnosis not present

## 2022-10-07 DIAGNOSIS — Z6832 Body mass index (BMI) 32.0-32.9, adult: Secondary | ICD-10-CM

## 2022-10-07 LAB — CBC WITH DIFFERENTIAL/PLATELET
Basophils Absolute: 0.1 10*3/uL (ref 0.0–0.1)
Basophils Relative: 1.3 % (ref 0.0–3.0)
Eosinophils Absolute: 0.2 10*3/uL (ref 0.0–0.7)
Eosinophils Relative: 2.3 % (ref 0.0–5.0)
HCT: 43.2 % (ref 36.0–46.0)
Hemoglobin: 13.8 g/dL (ref 12.0–15.0)
Lymphocytes Relative: 28.3 % (ref 12.0–46.0)
Lymphs Abs: 1.9 10*3/uL (ref 0.7–4.0)
MCHC: 32 g/dL (ref 30.0–36.0)
MCV: 87.6 fl (ref 78.0–100.0)
Monocytes Absolute: 0.4 10*3/uL (ref 0.1–1.0)
Monocytes Relative: 6.3 % (ref 3.0–12.0)
Neutro Abs: 4.2 10*3/uL (ref 1.4–7.7)
Neutrophils Relative %: 61.8 % (ref 43.0–77.0)
Platelets: 284 10*3/uL (ref 150.0–400.0)
RBC: 4.92 Mil/uL (ref 3.87–5.11)
RDW: 14.8 % (ref 11.5–15.5)
WBC: 6.7 10*3/uL (ref 4.0–10.5)

## 2022-10-07 LAB — COMPREHENSIVE METABOLIC PANEL
ALT: 11 U/L (ref 0–35)
AST: 17 U/L (ref 0–37)
Albumin: 3.7 g/dL (ref 3.5–5.2)
Alkaline Phosphatase: 122 U/L — ABNORMAL HIGH (ref 39–117)
BUN: 10 mg/dL (ref 6–23)
CO2: 27 mEq/L (ref 19–32)
Calcium: 9.5 mg/dL (ref 8.4–10.5)
Chloride: 105 mEq/L (ref 96–112)
Creatinine, Ser: 0.68 mg/dL (ref 0.40–1.20)
GFR: 91.1 mL/min (ref >60.00)
Glucose, Bld: 102 mg/dL — ABNORMAL HIGH (ref 70–99)
Potassium: 3.9 mEq/L (ref 3.5–5.1)
Sodium: 140 mEq/L (ref 135–145)
Total Bilirubin: 1.1 mg/dL (ref 0.2–1.2)
Total Protein: 6.9 g/dL (ref 6.0–8.3)

## 2022-10-07 LAB — HEMOGLOBIN A1C: Hgb A1c MFr Bld: 5.9 % (ref 4.6–6.5)

## 2022-10-07 LAB — VITAMIN D 25 HYDROXY (VIT D DEFICIENCY, FRACTURES): VITD: 52.48 ng/mL (ref 30.00–100.00)

## 2022-10-07 LAB — HIGH SENSITIVITY CRP: CRP, High Sensitivity: 6.08 mg/L — ABNORMAL HIGH (ref 0.000–5.000)

## 2022-10-07 LAB — LIPID PANEL
Cholesterol: 189 mg/dL (ref 0–200)
HDL: 56.6 mg/dL (ref >39.00)
LDL Cholesterol: 116 mg/dL — ABNORMAL HIGH (ref 0–99)
NonHDL: 132.83
Total CHOL/HDL Ratio: 3
Triglycerides: 83 mg/dL (ref 0.0–149.0)
VLDL: 16.6 mg/dL (ref 0.0–40.0)

## 2022-10-07 LAB — MAGNESIUM: Magnesium: 1.9 mg/dL (ref 1.5–2.5)

## 2022-10-07 NOTE — Patient Instructions (Addendum)
Vaccines I recommend: Egg free flu shot. Covid booster- new this fall Prevnar 20 Tdap (tetanus) RSV vaccine Shingrix   Check the  blood pressure regularly Blood pressure goal:  between 110/65 and  135/85. If it is consistently higher or lower, let me know    GO TO THE LAB : Get the blood work     GO TO THE FRONT DESK, PLEASE SCHEDULE YOUR APPOINTMENTS Come back for   a physical exam in 1 year.  Sooner depending on results.    "Health Care Power of attorney" ,  "Living will" (Advance care planning documents)  If you already have a living will or healthcare power of attorney, is recommended you bring the copy to be scanned in your chart.   The document will be available to all the doctors you see in the system.  Advance care planning is a process that supports adults in  understanding and sharing their preferences regarding future medical care.  The patient's preferences are recorded in documents called Advance Directives and the can be modified at any time while the patient is in full mental capacity.   If you don't have one, please consider create one.      More information at: [email or web address]

## 2022-10-07 NOTE — Progress Notes (Unsigned)
Subjective:    Patient ID: Brenda Russo, female    DOB: September 12, 1955, 67 y.o.   MRN: [id number]  DOS:  10/07/2022 Type of visit - description: CPX Here for CPX. Chronic medical problems addressed. Concerned about that history of increased CRPs. Concerned about electrolytes because she takes diuretics.  Wt Readings from Last 3 Encounters:  10/07/22 214 lb (97.1 kg)  07/05/22 218 lb 3.2 oz (99 kg)  06/07/22 220 lb (99.8 kg)     Review of Systems  Other than above, a 14 point review of systems is negative     Past Medical History:  Diagnosis Date   Allergic rhinitis    Allergy    Anemia    Anxiety    Asthma    HTN (hypertension)    Hyperlipidemia    borderline   Obese    OSA (obstructive sleep apnea)    mild ,no CPAP   Overweight(278.02)    Plantar wart    history   Prediabetes    Sleep apnea     Past Surgical History:  Procedure Laterality Date   BREAST BIOPSY Right 09/02/2015   CESAREAN SECTION     COLONOSCOPY  11/05/2018   COLONOSCOPY  2010   TUBAL LIGATION     Social History   Socioeconomic History   Marital status: Married    Spouse name: Not on file   Number of children: 3   Years of education: Not on file   Highest education level: Not on file  Occupational History   Occupation: Secondary school teacher at [employer]    Employer: [employer]    Comment: first shift  Tobacco Use   Smoking status: Never   Smokeless tobacco: Never  Substance and Sexual Activity   Alcohol use: No   Drug use: No   Sexual activity: Yes  Other Topics Concern   Not on file  Social History Narrative   3 biological children   1 adopted child   3 step-children   Household-- pt , husband and a son   Social Determinants of Corporate investment banker Strain: Not on file  Food Insecurity: Not on file  Transportation Needs: Not on file  Physical Activity: Not on file  Stress: Not on file  Social Connections: Not on file  Intimate Partner Violence: Not on file     Current Outpatient Medications  Medication Instructions   albuterol (VENTOLIN HFA) 108 (90 Base) MCG/ACT inhaler Inhale 2 puffs by mouth into the lungs every 4 (four) hours as needed for wheezing or shortness of breath.   amLODipine (NORVASC) 5 mg, Oral, Daily   Coenzyme Q10 (COQ10) 100 MG CAPS 1 capsule, Oral, Daily   Ergocalciferol 5,000 Units, Oral, Daily   fluticasone (FLONASE) 50 MCG/ACT nasal spray 2 sprays, Nasal, Daily PRN   fluticasone furoate-vilanterol (BREO ELLIPTA) 100-25 MCG/ACT AEPB 1 puff, Inhalation, Every morning   hydrochlorothiazide (HYDRODIURIL) 12.5 mg, Oral, Daily   losartan (COZAAR) 100 mg, Oral, Daily   Magnesium 200 MG TABS Oral   meloxicam (MOBIC) 7.5 mg, Oral, Daily   Multiple Minerals-Vitamins (CALCIUM CITRATE PLUS/MAGNESIUM) TABS 1 tablet, Oral   Omega-3 Fatty Acids (FISH OIL) 1000 MG CAPS 1 capsule, Oral       Objective:   Physical Exam BP 134/74   Pulse 65   Temp 98.1 F (36.7 C) (Oral)   Resp 16   Ht 5\' 5"  (1.651 m)   Wt 214 lb (97.1 kg)   SpO2 97%  BMI 35.61 kg/m  General: Well developed, NAD, BMI noted Neck: No  thyromegaly  HEENT:  Normocephalic . Face symmetric, atraumatic Lungs:  CTA B Normal respiratory effort, no intercostal retractions, no accessory muscle use. Heart: Frequent irregular beats.  No murmur. Abdomen:  Not distended, soft, non-tender. No rebound or rigidity.   Lower extremities: no pretibial edema bilaterally  Skin: Exposed areas without rash. Not pale. Not jaundice Neurologic:  alert & oriented X3.  Speech normal, gait appropriate for age and unassisted Strength symmetric and appropriate for age.  Psych: Cognition and judgment appear intact.  Cooperative with normal attention span and concentration.  Behavior appropriate. No anxious or depressed appearing.     Assessment     Assessment Prediabetes A1c 6.0 (05/2014) HTN Sinus bradycardia-PVCs    Hyperlipidemia Obesity Asthma - h/o intubation x2 in  the 90s  OSA-mild, dx 2014 , never tried CPAP BTL LLQ abd pain on-off , last GI visit 11-2015, CT abd (-) H/o anxiety  PLAN: Here for CPX - has declined vax consistently, nevertheless, rec   egg free flu shot every fall and new COVID booster when available --Female care: per gyn, PAP 01-2020 per [facility], had one 2022 per pt; [facility] 11-2021 --CCS--08/19/08- normal, C-scope 10/2018, polyps, next 7 hears per GI letter (Dr Christella Hartigan) --dexa 07-2015 WNL ,  DEXA 11-2020 T score (-) 1.1, DEXA 2027 --diet and exercise: Very active, pickleball 3 times a week.  Low carbohydrate diet discussed. --labs: CRP high sensitive, magnesium, CMP FLP CBC A1c -POA information provided  Hyperglycemia: Check A1c HTN: On ambulatory pills well within limits.  On amlodipine, losartan, HCTZ.  Patient request to check electrolytes, see orders Hyperlipidemia: Diet controlled.  Current CV risk at 10 years: 8.8%.  Labs.  Has a healthy lifestyle. Asthma: On Breo daily, has not needed rescue inhalers in a while. High CRP: Was seen by sports medicine last year, due to shoulder pain, CRP was elevated, sed rate normal, other markers negative as well.  Currently doing better but request to recheck a CRP.  Will do.  If elevated, she was made aware that that is a marker of increased CV risk I probably will Rx statins. PVCs: History of, EKG NSR with few PVCs, no symptoms.  Plans to  see cardiology this year Vitamin D deficiency: Labs. RTC 1 year, sooner depending on results.   OV level 4, chronic medical problems addressed, also requested to be eval for high CRP.

## 2022-10-08 ENCOUNTER — Encounter: Payer: Self-pay | Admitting: Internal Medicine

## 2022-10-08 NOTE — Assessment & Plan Note (Addendum)
Here for CPX - has declined vax consistently, nevertheless, rec   egg free flu shot every fall and new COVID booster when available --Female care: per gyn, PAP 01-2020 per [facility], had one 2022 per pt; MM[facility] 11-2021 --CCS--08/19/08- normal, C-scope 10/2018, polyps, next 7 hears per GI letter (Dr Christella Hartigan) --dexa 07-2015 WNL ,  DEXA 11-2020 T score (-) 1.1, DEXA 2027 --diet and exercise: Very active, pickleball 3 times a week.  Low carbohydrate diet discussed. --labs: CRP high sensitive, magnesium, CMP FLP CBC A1c -POA information provided

## 2022-10-08 NOTE — Assessment & Plan Note (Signed)
Here for CPX Hyperglycemia: Check A1c HTN: On ambulatory pills well within limits.  On amlodipine, losartan, HCTZ.  Patient request to check electrolytes, see orders Hyperlipidemia: Diet controlled.  Current CV risk at 10 years: 8.8%.  Labs.  Has a healthy lifestyle. Asthma: On Breo daily, has not needed rescue inhalers in a while. High CRP: Was seen by sports medicine last year, due to shoulder pain, CRP was elevated, sed rate normal, other markers negative as well.  Currently doing better but request to recheck a CRP.  Will do.  If elevated, she was made aware that that is a marker of increased CV risk I probably will Rx statins. PVCs: History of, EKG NSR with few PVCs, no symptoms.  Plans to  see cardiology this year Vitamin D deficiency: Labs. RTC 1 year, sooner depending on results.

## 2022-10-11 NOTE — Addendum Note (Signed)
Addended byConrad Belle D on: 10/11/2022 08:32 AM   Modules accepted: Orders

## 2022-10-18 DIAGNOSIS — R001 Bradycardia, unspecified: Secondary | ICD-10-CM | POA: Insufficient documentation

## 2022-10-19 ENCOUNTER — Ambulatory Visit: Payer: Medicare Other | Attending: Internal Medicine | Admitting: Internal Medicine

## 2022-10-19 ENCOUNTER — Encounter: Payer: Self-pay | Admitting: Internal Medicine

## 2022-10-19 VITALS — BP 132/80 | HR 54 | Ht 65.0 in | Wt 215.0 lb

## 2022-10-19 DIAGNOSIS — I493 Ventricular premature depolarization: Secondary | ICD-10-CM

## 2022-10-19 DIAGNOSIS — R001 Bradycardia, unspecified: Secondary | ICD-10-CM | POA: Diagnosis not present

## 2022-10-19 NOTE — Progress Notes (Signed)
Patient Care Team: Wanda Plump, MD as PCP - General (Internal Medicine) Duke Salvia, MD as PCP - Electrophysiology (Cardiology) Maxie Better, MD as Consulting Physician (Obstetrics and Gynecology) Michele Mcalpine, MD as Consulting Physician (Pulmonary Disease) Wilder Glade, MD as Consulting Physician [facility])   HPI  Brenda Russo is a 67 y.o. female Seen in follow-up for PVCs associated with pseudobradycardia, also  sinus bradycardia  The patient denies chest pain, shortness of breath, nocturnal dyspnea, orthopnea or peripheral edema.  There have been no palpitations, lightheadedness or syncope.   Told that she has a murmur, also a murmur was detected in 2020 prompting echocardiogram at that time which showed no significant valvular issues.  DATE TEST EF   12/20 Echo   60-65 %         Date Cr K Hgb  8/24 0.68 3.9 13.8           Records and Results Reviewed   Past Medical History:  Diagnosis Date   Allergic rhinitis    Allergy    Anemia    Anxiety    Asthma    HTN (hypertension)    Hyperlipidemia    borderline   Obese    OSA (obstructive sleep apnea)    mild ,no CPAP   Overweight(278.02)    Plantar wart    history   Prediabetes    Sleep apnea     Past Surgical History:  Procedure Laterality Date   BREAST BIOPSY Right 09/02/2015   CESAREAN SECTION     COLONOSCOPY  11/05/2018   COLONOSCOPY  2010   TUBAL LIGATION      Current Meds  Medication Sig   albuterol (VENTOLIN HFA) 108 (90 Base) MCG/ACT inhaler Inhale 2 puffs by mouth into the lungs every 4 (four) hours as needed for wheezing or shortness of breath.   amLODipine (NORVASC) 5 MG tablet TAKE 1 TABLET BY MOUTH DAILY   Coenzyme Q10 (COQ10) 100 MG CAPS Take 1 capsule by mouth daily.   Ergocalciferol 2500 units CAPS Take 5,000 Units by mouth daily.   fluticasone (FLONASE) 50 MCG/ACT nasal spray Place 2 sprays into the nose daily as needed.   fluticasone furoate-vilanterol  (BREO ELLIPTA) 100-25 MCG/ACT AEPB Inhale 1 puff into the lungs every morning.   hydrochlorothiazide (HYDRODIURIL) 12.5 MG tablet TAKE 1 TABLET BY MOUTH DAILY   losartan (COZAAR) 100 MG tablet TAKE 1 TABLET BY MOUTH DAILY   Magnesium 200 MG TABS Take by mouth.   Multiple Minerals-Vitamins (CALCIUM CITRATE PLUS/MAGNESIUM) TABS Take 1 tablet by mouth.   Omega-3 Fatty Acids (FISH OIL) 1000 MG CAPS Take 1 capsule by mouth.    Allergies  Allergen Reactions   Egg-Derived Products     Shortness of breath, wheezing      Review of Systems negative except from HPI and PMH  Physical Exam BP 132/80   Pulse (!) 54   Ht 5\' 5"  (1.651 m)   Wt 215 lb (97.5 kg)   SpO2 99%   BMI 35.78 kg/m  Well developed and nourished in no acute distress HENT normal Neck supple with JVP-  flat  Clear Regular rate and rhythm, 1/6 early systolic murmur  Abd-soft with active BS No Clubbing cyanosis edema Skin-warm and dry A & Oriented  Grossly normal sensory and motor function  ECG sinus at 64 Interval 17/08/46  Estimated Creatinine Clearance: 81 mL/min (by C-G formula based on SCr of 0.68 mg/dL).   Assessment  and  Plan  Sinus bradycardia-chronic   PVCs   Hypertension  Murmur-flow   No interval palpitations, suspect PVCs are quiescient but apparently has been told that she has "frequent PVCs "we discussed how she can survey her own heart rate for PVC burden.  But given the comments in the interim, not withstanding the lack of symptoms, we will check an echocardiogram to make sure that there is no impact on left ventricular function  Blood pressure well-controlled on amlodipine and losartan      Current medicines are reviewed at length with the patient today .  The patient does not  have concerns regarding medicines.

## 2022-10-19 NOTE — Patient Instructions (Addendum)
Medication Instructions:  Your physician recommends that you continue on your current medications as directed. Please refer to the Current Medication list given to you today.  *If you need a refill on your cardiac medications before your next appointment, please call your pharmacy*   Lab Work: None ordered.  If you have labs (blood work) drawn today and your tests are completely normal, you will receive your results only by: MyChart Message (if you have MyChart) OR A paper copy in the mail If you have any lab test that is abnormal or we need to change your treatment, we will call you to review the results.   Testing/Procedures: Your physician has requested that you have an echocardiogram. Echocardiography is a painless test that uses sound waves to create images of your heart. It provides your doctor with information about the size and shape of your heart and how well your heart's chambers and valves are working. This procedure takes approximately one hour. There are no restrictions for this procedure. Please do NOT wear cologne, perfume, aftershave, or lotions (deodorant is allowed). Please arrive 15 minutes prior to your appointment time.     Follow-Up: At [facility], you and your health needs are our priority.  As part of our continuing mission to provide you with exceptional heart care, we have created designated Provider Care Teams.  These Care Teams include your primary Cardiologist (physician) and Advanced Practice Providers (APPs -  Physician Assistants and Nurse Practitioners) who all work together to provide you with the care you need, when you need it.  We recommend signing up for the patient portal called "MyChart".  Sign up information is provided on this After Visit Summary.  MyChart is used to connect with patients for Virtual Visits (Telemedicine).  Patients are able to view lab/test results, encounter notes, upcoming appointments, etc.  Non-urgent messages can be  sent to your provider as well.   To learn more about what you can do with MyChart, go to [email or web address].    Your next appointment:   12 months with Dr Odessa Fleming PA

## 2022-11-04 ENCOUNTER — Other Ambulatory Visit (HOSPITAL_BASED_OUTPATIENT_CLINIC_OR_DEPARTMENT_OTHER): Payer: Self-pay | Admitting: Internal Medicine

## 2022-11-04 DIAGNOSIS — Z1231 Encounter for screening mammogram for malignant neoplasm of breast: Secondary | ICD-10-CM

## 2022-11-07 ENCOUNTER — Ambulatory Visit (HOSPITAL_COMMUNITY): Payer: Medicare Other | Attending: Internal Medicine

## 2022-11-07 DIAGNOSIS — I493 Ventricular premature depolarization: Secondary | ICD-10-CM | POA: Insufficient documentation

## 2022-11-07 DIAGNOSIS — I10 Essential (primary) hypertension: Secondary | ICD-10-CM | POA: Diagnosis not present

## 2022-11-07 DIAGNOSIS — I340 Nonrheumatic mitral (valve) insufficiency: Secondary | ICD-10-CM | POA: Diagnosis not present

## 2022-11-07 DIAGNOSIS — E785 Hyperlipidemia, unspecified: Secondary | ICD-10-CM | POA: Insufficient documentation

## 2022-11-07 DIAGNOSIS — R002 Palpitations: Secondary | ICD-10-CM | POA: Diagnosis present

## 2022-11-07 LAB — ECHOCARDIOGRAM COMPLETE
Area-P 1/2: 1.98 cm2
Est EF: 50
S' Lateral: 3.6 cm

## 2022-11-08 ENCOUNTER — Encounter (HOSPITAL_BASED_OUTPATIENT_CLINIC_OR_DEPARTMENT_OTHER): Payer: Self-pay

## 2022-11-08 ENCOUNTER — Ambulatory Visit (HOSPITAL_BASED_OUTPATIENT_CLINIC_OR_DEPARTMENT_OTHER)
Admission: RE | Admit: 2022-11-08 | Discharge: 2022-11-08 | Disposition: A | Discharge status: Home / Self Care | Payer: Medicare Other | Source: Ambulatory Visit | Attending: Internal Medicine | Admitting: Internal Medicine

## 2022-11-08 DIAGNOSIS — Z1231 Encounter for screening mammogram for malignant neoplasm of breast: Secondary | ICD-10-CM | POA: Insufficient documentation

## 2022-11-14 ENCOUNTER — Ambulatory Visit (INDEPENDENT_AMBULATORY_CARE_PROVIDER_SITE_OTHER): Payer: Medicare Other | Admitting: Internal Medicine

## 2022-11-14 ENCOUNTER — Encounter: Payer: Self-pay | Admitting: Internal Medicine

## 2022-11-14 ENCOUNTER — Ambulatory Visit (HOSPITAL_BASED_OUTPATIENT_CLINIC_OR_DEPARTMENT_OTHER)
Admission: RE | Admit: 2022-11-14 | Discharge: 2022-11-14 | Disposition: A | Discharge status: Home / Self Care | Payer: Medicare Other | Source: Ambulatory Visit | Attending: Internal Medicine | Admitting: Internal Medicine

## 2022-11-14 ENCOUNTER — Other Ambulatory Visit (HOSPITAL_BASED_OUTPATIENT_CLINIC_OR_DEPARTMENT_OTHER): Payer: Self-pay

## 2022-11-14 VITALS — BP 136/80 | HR 55 | Temp 98.0°F | Resp 16 | Ht 65.0 in | Wt 214.1 lb

## 2022-11-14 DIAGNOSIS — M47814 Spondylosis without myelopathy or radiculopathy, thoracic region: Secondary | ICD-10-CM | POA: Diagnosis not present

## 2022-11-14 DIAGNOSIS — M549 Dorsalgia, unspecified: Secondary | ICD-10-CM | POA: Insufficient documentation

## 2022-11-14 MED ORDER — MELOXICAM 7.5 MG PO TABS
7.5000 mg | ORAL_TABLET | Freq: Every day | ORAL | 0 refills | Status: DC
  Filled 2022-11-14: qty 30, 30d supply, fill #0

## 2022-11-14 NOTE — Progress Notes (Signed)
Subjective:    Patient ID: Brenda Russo, female    DOB: 08-Dec-1955, 67 y.o.   MRN: [id number]  DOS:  11/14/2022 Type of visit - description: Acute visit  3 weeks ago, developed pain at the T-spine, between shoulder blades. The pain is constant but increases with certain movements such as reaching out with her arms or twisting her torso. No radiation to the neck or anteriorly. No neck pain.  No paresthesias at the arms or chest. Denies fever or chills. No cough or difficulty breathing No recent falls.   Review of Systems See above   Past Medical History:  Diagnosis Date   Allergic rhinitis    Allergy    Anemia    Anxiety    Asthma    HTN (hypertension)    Hyperlipidemia    borderline   Obese    OSA (obstructive sleep apnea)    mild ,no CPAP   Overweight(278.02)    Plantar wart    history   Prediabetes    Sleep apnea     Past Surgical History:  Procedure Laterality Date   BREAST BIOPSY Right 09/02/2015   CESAREAN SECTION     COLONOSCOPY  11/05/2018   COLONOSCOPY  2010   TUBAL LIGATION      Current Outpatient Medications  Medication Instructions   albuterol (VENTOLIN HFA) 108 (90 Base) MCG/ACT inhaler Inhale 2 puffs by mouth into the lungs every 4 (four) hours as needed for wheezing or shortness of breath.   amLODipine (NORVASC) 5 mg, Oral, Daily   Coenzyme Q10 (COQ10) 100 MG CAPS 1 capsule, Oral, Daily   Ergocalciferol 5,000 Units, Oral, Daily   fluticasone (FLONASE) 50 MCG/ACT nasal spray 2 sprays, Nasal, Daily PRN   fluticasone furoate-vilanterol (BREO ELLIPTA) 100-25 MCG/ACT AEPB 1 puff, Inhalation, Every morning   hydrochlorothiazide (HYDRODIURIL) 12.5 mg, Oral, Daily   losartan (COZAAR) 100 mg, Oral, Daily   Magnesium 200 MG TABS Oral   meloxicam (MOBIC) 7.5 mg, Oral, Daily   Multiple Minerals-Vitamins (CALCIUM CITRATE PLUS/MAGNESIUM) TABS 1 tablet, Oral   Omega-3 Fatty Acids (FISH OIL) 1000 MG CAPS 1 capsule, Oral       Objective:    Physical Exam BP 136/80   Pulse (!) 55   Temp 98 F (36.7 C) (Oral)   Resp 16   Ht 5\' 5"  (1.651 m)   Wt 214 lb 2 oz (97.1 kg)   SpO2 96%   BMI 35.63 kg/m  General:   Well developed, NAD, BMI noted. HEENT:  Normocephalic . Face symmetric, atraumatic Neck: No TTP at the cervical spine, normal ROM. Thoracic spine: Slightly TTP at the T10 level. Skin: Not pale. Not jaundice Neurologic:  alert & oriented X3.  Speech normal, gait appropriate for age and unassisted Motor symmetric. Psych--  Cognition and judgment appear intact.  Cooperative with normal attention span and concentration.  Behavior appropriate. No anxious or depressed appearing.      Assessment    Assessment Prediabetes A1c 6.0 (05/2014) HTN Sinus bradycardia-PVCs    Hyperlipidemia Obesity Asthma - h/o intubation x2 in the 90s  OSA-mild, dx 2014 , never tried CPAP BTL LLQ abd pain on-off , last GI visit 11-2015, CT abd (-) H/o anxiety  PLAN: Pain, thoracic spine: Sxs as above, no red flags. Plan: X-ray, meloxicam with GI precautions, Tylenol as needed, call if not gradually better, Ortho referral?  May benefit from further investigation such as an MRI. Of note, recent echo showed a normal aortic valve. Sinus  bradycardia, PVCs: Saw cardiology 10/19/2022, had an echocardiogram shortly after

## 2022-11-14 NOTE — Assessment & Plan Note (Signed)
Pain, thoracic spine: Sxs as above, no red flags. Plan: X-ray, meloxicam with GI precautions, Tylenol as needed, call if not gradually better, Ortho referral?  May benefit from further investigation such as an MRI. Of note, recent echo showed a normal aortic valve. Sinus bradycardia, PVCs: Saw cardiology 10/19/2022, had an echocardiogram shortly after

## 2022-11-14 NOTE — Patient Instructions (Addendum)
Get a x-ray of your thoracic spine at the first floor  Take meloxicam 7.5 mg daily as needed for pain.  Always take it with food because may cause gastritis and ulcers.  If you notice nausea, stomach pain, change in the color of stools --->  Stop the medicine and let us know   You can also take Tylenol  500 mg OTC 2 tabs a day every 8 hours as needed for pain   If you are not much better in the next 2 weeks, please let me know, I we will refer you to the orthopedic doctor

## 2022-11-30 ENCOUNTER — Telehealth: Payer: Self-pay | Admitting: Internal Medicine

## 2022-11-30 DIAGNOSIS — I493 Ventricular premature depolarization: Secondary | ICD-10-CM

## 2022-11-30 NOTE — Telephone Encounter (Signed)
Patient returned staff call regarding results and stated can leave voice message on her cell number - [phone].

## 2022-11-30 NOTE — Telephone Encounter (Signed)
Attempted phone call to pt.  OK per pt to leave detailed voicemail message.  Pt advised echo results and recommendations per Dr Graciela Husbands as below.  Pt advised to call with any further questions or concerns.  Order placed.   Please Inform Patient Echo showed   low normal  heart muscle function which is probably worse than a few years ago With her hx of PVCs lets check a 3 d zio to quantitate please  Duke Salvia, MD

## 2022-12-01 ENCOUNTER — Ambulatory Visit: Payer: Medicare Other | Attending: Internal Medicine

## 2022-12-01 DIAGNOSIS — I493 Ventricular premature depolarization: Secondary | ICD-10-CM

## 2022-12-01 NOTE — Progress Notes (Unsigned)
Enrolled for [employer] to mail a ZIO XT long term holter monitor to the patients address on file.  

## 2022-12-05 ENCOUNTER — Other Ambulatory Visit (HOSPITAL_COMMUNITY): Payer: Self-pay

## 2022-12-05 MED ORDER — HYDROCODONE-ACETAMINOPHEN 5-325 MG PO TABS
1.0000 | ORAL_TABLET | ORAL | 0 refills | Status: DC
Start: 2022-12-05 — End: 2023-01-10
  Filled 2022-12-05: qty 20, 4d supply, fill #0

## 2022-12-05 MED ORDER — AMOXICILLIN 500 MG PO CAPS
500.0000 mg | ORAL_CAPSULE | Freq: Three times a day (TID) | ORAL | 0 refills | Status: AC
  Filled 2022-12-05: qty 21, 7d supply, fill #0

## 2022-12-06 ENCOUNTER — Encounter: Payer: Self-pay | Admitting: Internal Medicine

## 2022-12-06 DIAGNOSIS — M546 Pain in thoracic spine: Secondary | ICD-10-CM

## 2022-12-06 NOTE — Telephone Encounter (Signed)
Called reading room- rad asst will have radiologist read x-ray.

## 2022-12-06 NOTE — Telephone Encounter (Signed)
Referral placed.

## 2022-12-06 NOTE — Telephone Encounter (Signed)
Refer to Ortho, thoracic spine pain.

## 2022-12-08 ENCOUNTER — Ambulatory Visit: Payer: Medicare Other | Admitting: Physical Medicine and Rehabilitation

## 2022-12-08 ENCOUNTER — Encounter: Payer: Self-pay | Admitting: Physical Medicine and Rehabilitation

## 2022-12-08 ENCOUNTER — Other Ambulatory Visit (HOSPITAL_COMMUNITY): Payer: Self-pay

## 2022-12-08 DIAGNOSIS — M7918 Myalgia, other site: Secondary | ICD-10-CM | POA: Diagnosis not present

## 2022-12-08 DIAGNOSIS — G8929 Other chronic pain: Secondary | ICD-10-CM | POA: Diagnosis not present

## 2022-12-08 DIAGNOSIS — M546 Pain in thoracic spine: Secondary | ICD-10-CM | POA: Diagnosis not present

## 2022-12-08 MED ORDER — MELOXICAM 15 MG PO TABS
15.0000 mg | ORAL_TABLET | Freq: Every day | ORAL | 0 refills | Status: DC
  Filled 2022-12-08: qty 30, 30d supply, fill #0

## 2022-12-08 NOTE — Progress Notes (Signed)
She was given mobic for pain for pain and it does help  Feels like lightning shooting in her back. Playing pickleball after she had pain when she was hitting she was fine until after

## 2022-12-09 DIAGNOSIS — I493 Ventricular premature depolarization: Secondary | ICD-10-CM | POA: Diagnosis not present

## 2022-12-15 DIAGNOSIS — I493 Ventricular premature depolarization: Secondary | ICD-10-CM | POA: Diagnosis not present

## 2022-12-21 ENCOUNTER — Other Ambulatory Visit: Payer: Self-pay

## 2022-12-21 ENCOUNTER — Ambulatory Visit: Payer: Medicare Other | Admitting: Physical Therapy

## 2022-12-21 ENCOUNTER — Other Ambulatory Visit (HOSPITAL_COMMUNITY): Payer: Self-pay

## 2022-12-21 ENCOUNTER — Encounter: Payer: Self-pay | Admitting: Physical Therapy

## 2022-12-21 DIAGNOSIS — M546 Pain in thoracic spine: Secondary | ICD-10-CM | POA: Diagnosis not present

## 2022-12-21 DIAGNOSIS — R293 Abnormal posture: Secondary | ICD-10-CM | POA: Diagnosis not present

## 2022-12-21 DIAGNOSIS — M6281 Muscle weakness (generalized): Secondary | ICD-10-CM | POA: Diagnosis not present

## 2022-12-21 MED ORDER — AMOXICILLIN-POT CLAVULANATE 875-125 MG PO TABS
1.0000 | ORAL_TABLET | Freq: Two times a day (BID) | ORAL | 0 refills | Status: DC
  Filled 2022-12-21: qty 14, 7d supply, fill #0

## 2022-12-21 MED ORDER — CHLORHEXIDINE GLUCONATE 0.12 % MT SOLN
OROMUCOSAL | 2 refills | Status: DC
  Filled 2022-12-21: qty 473, 16d supply, fill #0

## 2022-12-21 NOTE — Therapy (Signed)
OUTPATIENT PHYSICAL THERAPY EVALUATION   Patient Name: Brenda Russo MRN: [id number] DOB:Aug 28, 1955, 67 y.o., female Today's Date: 12/21/2022  END OF SESSION:  PT End of Session - 12/21/22 1437     Visit Number 1    Number of Visits 6    Date for PT Re-Evaluation 02/01/23    Authorization Type [employer] Medicare    Progress Note Due on Visit 10    PT Start Time 1426    PT Stop Time 1501    PT Time Calculation (min) 35 min    Activity Tolerance Patient tolerated treatment well    Behavior During Therapy WFL for tasks assessed/performed             Past Medical History:  Diagnosis Date   Allergic rhinitis    Allergy    Anemia    Anxiety    Asthma    HTN (hypertension)    Hyperlipidemia    borderline   Obese    OSA (obstructive sleep apnea)    mild ,no CPAP   Overweight(278.02)    Plantar wart    history   Prediabetes    Sleep apnea    Past Surgical History:  Procedure Laterality Date   BREAST BIOPSY Right 09/02/2015   CESAREAN SECTION     COLONOSCOPY  11/05/2018   COLONOSCOPY  2010   TUBAL LIGATION     Patient Active Problem List   Diagnosis Date Noted   Sinus bradycardia 10/18/2022   Hyperglycemia, unspecified 10/07/2022   Musculoskeletal back pain 06/07/2022   Murmur 01/07/2019   Insulin resistance 12/06/2016   Vitamin D deficiency 12/06/2016   High serum vitamin B12 12/06/2016   Class 1 obesity with serious comorbidity and body mass index (BMI) of 32.0 to 32.9 in adult 08/30/2016   PVC's (premature ventricular contractions) 04/13/2016   HH (hiatus hernia) 12/24/2015   Laryngopharyngeal reflux (LPR) 12/24/2015   PCP NOTES >>>>>>>>>>>>>>>>>>>> 05/20/2015   Annual physical exam 04/29/2014   OSA (obstructive sleep apnea) 09/25/2012   DJD (degenerative joint disease) 07/02/2010   Essential hypertension 05/21/2009   HYPERCHOLESTEROLEMIA, BORDERLINE 07/27/2007   Allergic rhinitis 07/27/2007   Asthma, chronic, moderate persistent, uncomplicated  01/11/2007    PCP: Wanda Plump, MD  REFERRING PROVIDER: Juanda Chance, NP  REFERRING DIAG: [phone] (ICD-10-CM) - Chronic bilateral thoracic back pain M79.18 (ICD-10-CM) - Myofascial pain syndrome  Rationale for Evaluation and Treatment: Rehabilitation  THERAPY DIAG:  Pain in thoracic spine - Plan: PT plan of care cert/re-cert  Abnormal posture - Plan: PT plan of care cert/re-cert  Muscle weakness (generalized) - Plan: PT plan of care cert/re-cert  ONSET DATE: Few months ago   SUBJECTIVE:  SUBJECTIVE STATEMENT: Pt reports some mid back pain when playing pickleball over the past few months.  Pain occasionally feels like a "shock" or "muscles tearing."  She tried to just do some light hitting about 3 weeks ago and her back flared back up again.  PERTINENT HISTORY:  anxiety, asthma, HTN, OSA, obesity  PAIN:  Are you having pain? Yes: NPRS scale: 0 currently, up to 6-7/10 Pain location: mid thoracic Pain description: toothache, shock, "tearing" Aggravating factors: pickleball or trying to lift Relieving factors: sitting in chair with pressure to area, mobic  PRECAUTIONS:  None  RED FLAGS: None   WEIGHT BEARING RESTRICTIONS:  No  FALLS:  Has patient fallen in last 6 months? 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 No acute abnormality seen.  PATIENT  SURVEYS:  12/21/22 FOTO 47 (predicted 61)  COGNITIVE STATUS: Within functional limits for tasks assessed   SENSATION: WFL  POSTURE:  rounded shoulders  HAND DOMINANCE:  Right  GAIT: 12/21/22 Comments: independent  PALPATION: 12/21/22: pain T6-9 PA gr1 mobs    UPPER EXTREMITY MMT:  MMT Right eval Left eval  Shoulder flexion 3/5 3/5  Shoulder extension    Shoulder abduction 3/5 3/5  Lower trapezius 4/5 4/5   (Blank rows = not tested)   THORACOLUMBAR ROM:   Active  AROM  eval  Flexion Limited 20%  Extension WNL  Right lateral flexion WNL  Left lateral flexion WNL  Right rotation WNL  Left rotation WNL   (Blank rows = not tested)    TREATMENT:                                                                                                                              DATE:  12/21/22 See HEP - demonstrated with trial reps performed PRN for comprehension; did discuss DN but deferred today    PATIENT EDUCATION:  Education details: HEP, DN Person educated: Patient Education method: Explanation, Demonstration, and Handouts Education comprehension: verbalized understanding, returned demonstration, and needs further education  HOME EXERCISE PROGRAM: Access Code: [email or web address] URL: [email or web address] Date: 12/21/2022 Prepared by: Moshe Cipro  Exercises - Prone W Scapular Retraction  - 1 x daily - 7 x weekly - 3 sets - 10 reps - Seated Scapular Retraction  - 1 x daily - 7 x weekly - 1 sets - 10 reps - 5 sec hold - Seated Flexion Stretch with Swiss Ball  - 1 x daily - 7 x weekly - 1 sets - 5 reps - 10-15 sec hold - Seated Thoracic Flexion and  Rotation with [employer]  - 1 x daily - 7 x weekly - 1 sets - 5 reps - 10-15 sec hold - Seated Thoracic Lumbar Extension  - 1 x daily - 7 x weekly - 1 sets - 5 reps - 10-15 sec hold - Standing Paraspinals Mobilization with Small Ball on Wall  - 1-2 x daily - 7 x weekly - 1 sets - 1 reps - 2-3 min  hold  Patient Education - Trigger Point Dry Needling   ASSESSMENT:  CLINICAL IMPRESSION: Patient is a 67 y.o. female who was seen today for physical therapy evaluation and treatment for mid back pain. She demonstrates postural abnormalities and strength deficits as well as continued pain affecting functional mobility.  She will benefit from PT to address deficits listed.     OBJECTIVE IMPAIRMENTS: decreased ROM, decreased strength, hypomobility, increased fascial restrictions, postural dysfunction, and pain.   ACTIVITY LIMITATIONS: carrying, lifting, bending, and locomotion level  PARTICIPATION LIMITATIONS: meal prep, cleaning, laundry, shopping, community activity, and yard work  PERSONAL FACTORS: 3+ comorbidities: anxiety, asthma, HTN, OSA, obesity  are also affecting patient's functional outcome.   REHAB POTENTIAL: Good  CLINICAL DECISION MAKING: Evolving/moderate complexity  EVALUATION COMPLEXITY: Moderate   GOALS: Goals reviewed with patient? Yes  SHORT TERM GOALS: Target date: 01/11/2023   Independent with initial HEP Goal status: INITIAL    LONG TERM GOALS: Target date: 02/01/2023  Independent with final HEP Goal status: INITIAL  2.  FOTO score improved to 61 Goal status: INITIAL  3.  Thoracolumbar flexion improved to WNL without pain for improved flexibility Goal status: INIITAL  4.  Report pain < 2/10 with light pickleball activities for improved function Goal status: INITIAL    PLAN:  PT FREQUENCY: 1x/week  PT DURATION: 6 weeks  PLANNED INTERVENTIONS: 97164- PT Re-evaluation, 97110-Therapeutic exercises, 97530- Therapeutic activity, [id number]- Neuromuscular re-education, 97535- Self Care, 30865- Manual therapy, [id number]- Aquatic Therapy, 97014- Electrical stimulation (unattended), [id number]- Traction (mechanical), Patient/Family education, Taping, Dry Needling, Spinal manipulation, Spinal mobilization, Cryotherapy, and Moist heat.  PLAN FOR NEXT SESSION:  review HEP, DN if pt desires, work on postural and UB strengthening   NEXT MD VISIT: 02/09/23   Clarita Crane, PT, DPT 12/21/22 3:09 PM   Date of referral: 12/08/22 Referring provider: Juanda Chance, NP  Referring diagnosis? M54.6,G89.29 (ICD-10-CM) - Chronic bilateral thoracic back pain M79.18 (ICD-10-CM) - Myofascial pain syndrome Treatment diagnosis? (if different than referring diagnosis) M54.6, R29.3, M62.81  What was this (referring dx) caused by? Unspecified  Nature of Condition: Initial Onset (within last 3 months)   Laterality: Both  Current Functional Measure Score: FOTO 47  Objective measurements identify impairments when they are compared to normal values, the uninvolved extremity, and prior level of function.  [x]  Yes  []  No  Objective assessment of functional ability: Moderate functional limitations   Briefly describe symptoms: pain in thoracic spine  How did symptoms start: playing pickleball, lifting objects  Average pain intensity:  Last 24 hours: 3  Past week: 7  How often does the pt experience symptoms? Occasionally  How much have the symptoms interfered with usual daily activities? Moderately  How has condition changed since care began at this facility? A little better  In general, how is the patients overall health? Good   BACK PAIN (STarT Back Screening Tool) No

## 2023-01-03 ENCOUNTER — Ambulatory Visit: Payer: Medicare Other | Admitting: Physical Therapy

## 2023-01-03 ENCOUNTER — Encounter: Payer: Self-pay | Admitting: Physical Therapy

## 2023-01-03 DIAGNOSIS — M546 Pain in thoracic spine: Secondary | ICD-10-CM | POA: Diagnosis not present

## 2023-01-03 DIAGNOSIS — M6281 Muscle weakness (generalized): Secondary | ICD-10-CM | POA: Diagnosis not present

## 2023-01-03 DIAGNOSIS — R293 Abnormal posture: Secondary | ICD-10-CM

## 2023-01-03 NOTE — Therapy (Signed)
OUTPATIENT PHYSICAL THERAPY TREATMENT   Patient Name: Brenda Russo MRN: [id number] DOB:1955-10-20, 67 y.o., female Today's Date: 01/03/2023  END OF SESSION:  PT End of Session - 01/03/23 0848     Visit Number 2    Number of Visits 6    Date for PT Re-Evaluation 02/01/23    Authorization Type [employer] Medicare    Progress Note Due on Visit 10    PT Start Time 0840    PT Stop Time 0920    PT Time Calculation (min) 40 min    Activity Tolerance Patient tolerated treatment well    Behavior During Therapy WFL for tasks assessed/performed              Past Medical History:  Diagnosis Date   Allergic rhinitis    Allergy    Anemia    Anxiety    Asthma    HTN (hypertension)    Hyperlipidemia    borderline   Obese    OSA (obstructive sleep apnea)    mild ,no CPAP   Overweight(278.02)    Plantar wart    history   Prediabetes    Sleep apnea    Past Surgical History:  Procedure Laterality Date   BREAST BIOPSY Right 09/02/2015   CESAREAN SECTION     COLONOSCOPY  11/05/2018   COLONOSCOPY  2010   TUBAL LIGATION     Patient Active Problem List   Diagnosis Date Noted   Sinus bradycardia 10/18/2022   Hyperglycemia, unspecified 10/07/2022   Musculoskeletal back pain 06/07/2022   Murmur 01/07/2019   Insulin resistance 12/06/2016   Vitamin D deficiency 12/06/2016   High serum vitamin B12 12/06/2016   Class 1 obesity with serious comorbidity and body mass index (BMI) of 32.0 to 32.9 in adult 08/30/2016   PVC's (premature ventricular contractions) 04/13/2016   HH (hiatus hernia) 12/24/2015   Laryngopharyngeal reflux (LPR) 12/24/2015   PCP NOTES >>>>>>>>>>>>>>>>>>>> 05/20/2015   Annual physical exam 04/29/2014   OSA (obstructive sleep apnea) 09/25/2012   DJD (degenerative joint disease) 07/02/2010   Essential hypertension 05/21/2009   HYPERCHOLESTEROLEMIA, BORDERLINE 07/27/2007   Allergic rhinitis 07/27/2007   Asthma, chronic, moderate persistent, uncomplicated  01/11/2007    PCP: Wanda Plump, MD  REFERRING PROVIDER: Juanda Chance, NP  REFERRING DIAG: [phone] (ICD-10-CM) - Chronic bilateral thoracic back pain M79.18 (ICD-10-CM) - Myofascial pain syndrome  Rationale for Evaluation and Treatment: Rehabilitation  THERAPY DIAG:  Pain in thoracic spine  Abnormal posture  Muscle weakness (generalized)  ONSET DATE: Few months ago   SUBJECTIVE:  SUBJECTIVE STATEMENT: Hasn't been taking anti-inflammatories for a day or two, and pain is present but not as severe.  Exercises are going well; the ball is more painful when hitting trigger points.  PERTINENT HISTORY:  anxiety, asthma, HTN, OSA, obesity  PAIN:  Are you having pain? Yes: NPRS scale: 0 currently, up to /10 Pain location: mid thoracic Pain description: toothache, shock, "tearing" Aggravating factors: pickleball or trying to lift Relieving factors: sitting in chair with pressure to area, mobic  PRECAUTIONS:  None  RED FLAGS: None   WEIGHT BEARING RESTRICTIONS:  No  FALLS:  Has patient fallen in last 6 months? 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 No acute abnormality seen.  PATIENT SURVEYS:  12/21/22 FOTO 47 (predicted 61)  COGNITIVE STATUS: Within functional limits for tasks assessed   SENSATION: WFL  POSTURE:  rounded shoulders  HAND  DOMINANCE:  Right  GAIT: 12/21/22 Comments: independent  PALPATION: 12/21/22: pain T6-9 PA gr1 mobs    UPPER EXTREMITY MMT:  MMT Right eval Left eval  Shoulder flexion 3/5 3/5  Shoulder extension    Shoulder abduction 3/5 3/5  Lower trapezius 4/5 4/5   (Blank rows = not tested)   THORACOLUMBAR ROM:   Active  AROM  eval  Flexion Limited 20%  Extension WNL  Right lateral flexion WNL  Left lateral flexion WNL  Right rotation WNL  Left rotation WNL   (Blank rows = not tested)    TREATMENT:                                                                                                                              DATE:  01/03/23 TherEx Rows L3 band 2x10; 5 sec hold Extension L3 band 2x10; 5 sec hold Quadruped thread the needle 2x5 bil Prone "W" back x 10 reps  Manual IASTM with percussive device to upper thoracic paraspinals and rhomboids, TPR to active trigger points with 30-60 sec holds    12/21/22 See HEP - demonstrated with trial reps performed PRN for comprehension; did discuss DN but deferred today    PATIENT EDUCATION:  Education details: HEP, DN Person educated: Patient Education method: Explanation, Demonstration, and Handouts Education comprehension: verbalized understanding, returned demonstration, and needs further education  HOME EXERCISE PROGRAM: Access Code: [email or web address] Access Code: [email or web address] URL: [email or web address] Date: 01/03/2023 Prepared by: Moshe Cipro  Exercises - Prone W Scapular Retraction  - 1 x daily - 7 x weekly - 3 sets - 10 reps - Seated Scapular Retraction  - 1 x daily - 7 x  weekly - 1 sets - 10 reps - 5 sec hold - Seated Flexion Stretch with Swiss Ball  - 1 x daily - 7 x weekly - 1 sets - 5 reps - 10-15 sec hold - Seated Thoracic Flexion and Rotation with Swiss Ball  - 1 x daily - 7 x weekly - 1 sets - 5 reps - 10-15 sec hold - Seated Thoracic Lumbar Extension  - 1 x daily - 7 x weekly - 1 sets - 5  reps - 10-15 sec hold - Standing Paraspinals Mobilization with Small Ball on Wall  - 1-2 x daily - 7 x weekly - 1 sets - 1 reps - 2-3 min hold - Standing Shoulder Row with Anchored Resistance  - 1 x daily - 7 x weekly - 2 sets - 10 reps - Shoulder extension with resistance - Neutral  - 1 x daily - 7 x weekly - 2 sets - 10 reps - Shoulder External Rotation and Scapular Retraction with Resistance  - 1 x daily - 7 x weekly - 2 sets - 10 reps - 5 sec hold  Patient Education - Trigger Point Dry Needling   ASSESSMENT:  CLINICAL IMPRESSION: Pt reporting improvement in intensity of pain and has stopped anti-inflammatories as well.  Overall doing well with PT and will continue to benefit from PT to maximize function.   OBJECTIVE IMPAIRMENTS: decreased ROM, decreased strength, hypomobility, increased fascial restrictions, postural dysfunction, and pain.   ACTIVITY LIMITATIONS: carrying, lifting, bending, and locomotion level  PARTICIPATION LIMITATIONS: meal prep, cleaning, laundry, shopping, community activity, and yard work  PERSONAL FACTORS: 3+ comorbidities: anxiety, asthma, HTN, OSA, obesity  are also affecting patient's functional outcome.   REHAB POTENTIAL: Good  CLINICAL DECISION MAKING: Evolving/moderate complexity  EVALUATION COMPLEXITY: Moderate   GOALS: Goals reviewed with patient? Yes  SHORT TERM GOALS: Target date: 01/11/2023   Independent with initial HEP Goal status: INITIAL    LONG TERM GOALS: Target date: 02/01/2023  Independent with final HEP Goal status: INITIAL  2.  FOTO score improved to 61 Goal status: INITIAL  3.  Thoracolumbar flexion improved to WNL without pain for improved flexibility Goal status: INIITAL  4.  Report pain < 2/10 with light pickleball activities for improved function Goal status: INITIAL    PLAN:  PT FREQUENCY: 1x/week  PT DURATION: 6 weeks  PLANNED INTERVENTIONS: 97164- PT Re-evaluation, 97110-Therapeutic exercises,  97530- Therapeutic activity, [id number]- Neuromuscular re-education, 97535- Self Care, 09811- Manual therapy, [id number]- Aquatic Therapy, 97014- Electrical stimulation (unattended), [id number]- Traction (mechanical), Patient/Family education, Taping, Dry Needling, Spinal manipulation, Spinal mobilization, Cryotherapy, and Moist heat.  PLAN FOR NEXT SESSION: review HEP PRN, DN if pt desires, work on postural and UB strengthening   NEXT MD VISIT: 02/09/23   Clarita Crane, PT, DPT 01/03/23 9:33 AM   Date of referral: 12/08/22 Referring provider: Juanda Chance, NP  Referring diagnosis? M54.6,G89.29 (ICD-10-CM) - Chronic bilateral thoracic back pain M79.18 (ICD-10-CM) - Myofascial pain syndrome Treatment diagnosis? (if different than referring diagnosis) M54.6, R29.3, M62.81  What was this (referring dx) caused by? Unspecified  Nature of Condition: Initial Onset (within last 3 months)   Laterality: Both  Current Functional Measure Score: FOTO 47  Objective measurements identify impairments when they are compared to normal values, the uninvolved extremity, and prior level of function.  [x]  Yes  []  No  Objective assessment of functional ability: Moderate functional limitations   Briefly describe symptoms: pain in thoracic spine  How did symptoms start: playing pickleball,  lifting objects  Average pain intensity:  Last 24 hours: 3  Past week: 7  How often does the pt experience symptoms? Occasionally  How much have the symptoms interfered with usual daily activities? Moderately  How has condition changed since care began at this facility? A little better  In general, how is the patients overall health? Good   BACK PAIN (STarT Back Screening Tool) No

## 2023-01-10 ENCOUNTER — Ambulatory Visit (INDEPENDENT_AMBULATORY_CARE_PROVIDER_SITE_OTHER): Payer: Medicare Other | Admitting: Internal Medicine

## 2023-01-10 ENCOUNTER — Other Ambulatory Visit (HOSPITAL_BASED_OUTPATIENT_CLINIC_OR_DEPARTMENT_OTHER): Payer: Self-pay

## 2023-01-10 ENCOUNTER — Other Ambulatory Visit: Payer: Medicare Other

## 2023-01-10 ENCOUNTER — Other Ambulatory Visit: Payer: Self-pay

## 2023-01-10 ENCOUNTER — Encounter: Payer: Self-pay | Admitting: Internal Medicine

## 2023-01-10 ENCOUNTER — Other Ambulatory Visit (HOSPITAL_COMMUNITY): Payer: Self-pay

## 2023-01-10 VITALS — BP 126/80 | HR 44 | Temp 97.8°F | Resp 16 | Ht 65.0 in | Wt 220.5 lb

## 2023-01-10 DIAGNOSIS — R21 Rash and other nonspecific skin eruption: Secondary | ICD-10-CM | POA: Diagnosis not present

## 2023-01-10 DIAGNOSIS — R748 Abnormal levels of other serum enzymes: Secondary | ICD-10-CM | POA: Diagnosis not present

## 2023-01-10 MED ORDER — HYDROCORTISONE 2.5 % EX CREA
TOPICAL_CREAM | Freq: Two times a day (BID) | CUTANEOUS | 0 refills | Status: DC
Start: 2023-01-10 — End: 2023-01-10
  Filled 2023-01-10: qty 30, 30d supply, fill #0

## 2023-01-10 MED ORDER — TRIAMCINOLONE ACETONIDE 0.1 % EX LOTN
1.0000 | TOPICAL_LOTION | Freq: Three times a day (TID) | CUTANEOUS | 1 refills | Status: DC
  Filled 2023-01-10 (×2): qty 60, 30d supply, fill #0

## 2023-01-10 NOTE — Assessment & Plan Note (Signed)
Rash: At the extremities, see physical exam, sxs nonspecific, recommend Kenalog lotion for 2 weeks. Dry skin: Avoid hot showers, Aveeno after every shower. Slightly increased AP: Checking LFTs, GGT, alkaline phosphatase isoenzymes Bradycardia: Per cardiology. RTC scheduled for September 2025

## 2023-01-10 NOTE — Progress Notes (Signed)
Subjective:    Patient ID: Brenda Russo, female    DOB: Apr 29, 1955, 67 y.o.   MRN: [id number]  DOS:  01/10/2023 Type of visit - description: Acute  2-week history of a slightly itchy rash at the upper extremities, more so on the right. No rash on the abdomen or chest. Similar symptoms on the lower extremities.   Review of Systems See above   Past Medical History:  Diagnosis Date   Allergic rhinitis    Allergy    Anemia    Anxiety    Asthma    HTN (hypertension)    Hyperlipidemia    borderline   Obese    OSA (obstructive sleep apnea)    mild ,no CPAP   Overweight(278.02)    Plantar wart    history   Prediabetes    Sleep apnea     Past Surgical History:  Procedure Laterality Date   BREAST BIOPSY Right 09/02/2015   CESAREAN SECTION     COLONOSCOPY  11/05/2018   COLONOSCOPY  2010   TUBAL LIGATION      Current Outpatient Medications  Medication Instructions   albuterol (VENTOLIN HFA) 108 (90 Base) MCG/ACT inhaler Inhale 2 puffs by mouth into the lungs every 4 (four) hours as needed for wheezing or shortness of breath.   amLODipine (NORVASC) 5 mg, Oral, Daily   amoxicillin-clavulanate (AUGMENTIN) 875-125 MG tablet 1 tablet, Oral, Every 12 hours   chlorhexidine (PERIDEX) 0.12 % solution Swish 15ml in mouth for 30 seconds 2 times per day (after meals), then spit out   Coenzyme Q10 (COQ10) 100 MG CAPS 1 capsule, Oral, Daily   Ergocalciferol 5,000 Units, Oral, Daily   fluticasone (FLONASE) 50 MCG/ACT nasal spray 2 sprays, Nasal, Daily PRN   fluticasone furoate-vilanterol (BREO ELLIPTA) 100-25 MCG/ACT AEPB 1 puff, Inhalation, Every morning   hydrochlorothiazide (HYDRODIURIL) 12.5 mg, Oral, Daily   HYDROcodone-acetaminophen (NORCO/VICODIN) 5-325 MG tablet Take 1 tablet by mouth every 4-6 hours as needed for pain.   losartan (COZAAR) 100 mg, Oral, Daily   Magnesium 200 MG TABS Oral   meloxicam (MOBIC) 15 mg, Oral, Daily   Multiple Minerals-Vitamins (CALCIUM  CITRATE PLUS/MAGNESIUM) TABS 1 tablet, Oral   Omega-3 Fatty Acids (FISH OIL) 1000 MG CAPS 1 capsule, Oral       Objective:   Physical Exam Skin:         Comments: Several areas of slightly raised skin at the right arm, scaly, slightly hyperpigmented. She reports has similar rash on the lower extremities but could not show me because she is using panties. Back  free of any rash. Skin is in general very dry  BP 126/80   Pulse (!) 44   Temp 97.8 F (36.6 C) (Oral)   Resp 16   Ht 5\' 5"  (1.651 m)   Wt 220 lb 8 oz (100 kg)   SpO2 96%   BMI 36.69 kg/m  General:   Well developed, NAD, BMI noted. HEENT:  Normocephalic . Face symmetric, atraumatic Skin: See graphic Neurologic:  alert & oriented X3.  Speech normal, gait appropriate for age and unassisted Psych--  Cognition and judgment appear intact.  Cooperative with normal attention span and concentration.  Behavior appropriate. No anxious or depressed appearing.      Assessment     Assessment Prediabetes A1c 6.0 (05/2014) HTN Sinus bradycardia-PVCs    Hyperlipidemia Obesity Asthma - h/o intubation x2 in the 90s  OSA-mild, dx 2014 , never tried CPAP BTL LLQ abd pain on-off ,  last GI visit 11-2015, CT abd (-) H/o anxiety  PLAN: Rash: At the extremities, see physical exam, sxs nonspecific, recommend Kenalog lotion for 2 weeks. Dry skin: Avoid hot showers, Aveeno after every shower. Slightly increased AP: Checking LFTs, GGT, alkaline phosphatase isoenzymes Bradycardia: Per cardiology. RTC scheduled for September 2025

## 2023-01-10 NOTE — Patient Instructions (Addendum)
For the rash in your arms: Apply Kenalog lotion twice daily for 2 week.  Ask pharmacist to disregard hydrocortisone 2.5%  For dry skin: Avoid hot showers, use Aveeno lotion OTC immediately after taking a shower.  Call if not gradually better  Proceed with your blood work  Next visit September 2025.  Sooner if needed

## 2023-01-11 ENCOUNTER — Other Ambulatory Visit (HOSPITAL_BASED_OUTPATIENT_CLINIC_OR_DEPARTMENT_OTHER): Payer: Self-pay

## 2023-01-11 ENCOUNTER — Telehealth: Payer: Self-pay | Admitting: Internal Medicine

## 2023-01-11 DIAGNOSIS — I429 Cardiomyopathy, unspecified: Secondary | ICD-10-CM

## 2023-01-11 DIAGNOSIS — I10 Essential (primary) hypertension: Secondary | ICD-10-CM

## 2023-01-11 DIAGNOSIS — I493 Ventricular premature depolarization: Secondary | ICD-10-CM

## 2023-01-11 DIAGNOSIS — Z79899 Other long term (current) drug therapy: Secondary | ICD-10-CM

## 2023-01-11 LAB — HEPATIC FUNCTION PANEL
ALT: 17 U/L (ref 0–35)
AST: 22 U/L (ref 0–37)
Albumin: 3.9 g/dL (ref 3.5–5.2)
Alkaline Phosphatase: 92 U/L (ref 39–117)
Bilirubin, Direct: 0.2 mg/dL (ref 0.0–0.3)
Total Bilirubin: 0.8 mg/dL (ref 0.2–1.2)
Total Protein: 7.1 g/dL (ref 6.0–8.3)

## 2023-01-11 LAB — GAMMA GT: GGT: 19 U/L (ref 7–51)

## 2023-01-11 NOTE — Telephone Encounter (Signed)
Patient called stating Dr. Graciela Husbands called her on Sunday, telling her was going to start her on new medication and his nurse would call her on Monday.  Patient called to say she would like for the medication to go to [facility] [place] [facility].

## 2023-01-12 ENCOUNTER — Other Ambulatory Visit: Payer: Self-pay

## 2023-01-12 ENCOUNTER — Other Ambulatory Visit (HOSPITAL_COMMUNITY): Payer: Self-pay

## 2023-01-12 ENCOUNTER — Ambulatory Visit: Payer: Medicare Other | Attending: Internal Medicine

## 2023-01-12 DIAGNOSIS — I493 Ventricular premature depolarization: Secondary | ICD-10-CM

## 2023-01-12 MED ORDER — FLECAINIDE ACETATE 50 MG PO TABS
50.0000 mg | ORAL_TABLET | Freq: Two times a day (BID) | ORAL | 1 refills | Status: DC
  Filled 2023-01-12: qty 180, 90d supply, fill #0
  Filled 2023-04-07: qty 180, 90d supply, fill #1

## 2023-01-12 MED ORDER — SPIRONOLACTONE 25 MG PO TABS
25.0000 mg | ORAL_TABLET | Freq: Every day | ORAL | 1 refills | Status: DC
  Filled 2023-01-12: qty 90, 90d supply, fill #0
  Filled 2023-04-07: qty 90, 90d supply, fill #1

## 2023-01-12 NOTE — Addendum Note (Signed)
Addended by: [name] Cliche on: 01/12/2023 10:50 AM   Modules accepted: Orders

## 2023-01-12 NOTE — Progress Notes (Unsigned)
Enrolled patient for a 3 day Zio XT monitor to be mailed to patients home around 02/13/23 patient to wear early jan after starting Flecainide

## 2023-01-12 NOTE — Telephone Encounter (Signed)
Attempted phone call to pt and left voicemail message to contact RN at [phone]. 

## 2023-01-12 NOTE — Telephone Encounter (Signed)
Spoke with pt and reviewed monitor results and recommendation as below per Dr Graciela Husbands.  Pt verbalizes understanding and agrees with current plan.     Recommendations For HTN and cardiomyopathy will stop hydrochlorothiazide and begin spironolactone 25 mg daily Will need BMET in 2 weeks At two weeks begin flecainide 50 bid   Will need GXT 1 week following flecainide initiation  Zio patch 3 weeks after initiation to quantitate PVCs

## 2023-01-13 ENCOUNTER — Other Ambulatory Visit (HOSPITAL_COMMUNITY): Payer: Self-pay

## 2023-01-13 ENCOUNTER — Ambulatory Visit: Payer: Medicare Other | Admitting: Physical Therapy

## 2023-01-13 ENCOUNTER — Encounter: Payer: Self-pay | Admitting: Physical Therapy

## 2023-01-13 DIAGNOSIS — R293 Abnormal posture: Secondary | ICD-10-CM | POA: Diagnosis not present

## 2023-01-13 DIAGNOSIS — M6281 Muscle weakness (generalized): Secondary | ICD-10-CM

## 2023-01-13 DIAGNOSIS — M546 Pain in thoracic spine: Secondary | ICD-10-CM

## 2023-01-13 NOTE — Therapy (Signed)
OUTPATIENT PHYSICAL THERAPY TREATMENT   Patient Name: Brenda Russo MRN: [id number] DOB:11/25/1955, 66 y.o., female Today's Date: 01/13/2023  END OF SESSION:  PT End of Session - 01/13/23 0929     Visit Number 3    Number of Visits 6    Date for PT Re-Evaluation 02/01/23    Authorization Type [employer]HC Medicare    Progress Note Due on Visit 10    PT Start Time 0930    PT Stop Time 1008    PT Time Calculation (min) 38 min    Activity Tolerance Patient tolerated treatment well    Behavior During Therapy WFL for tasks assessed/performed               Past Medical History:  Diagnosis Date   Allergic rhinitis    Allergy    Anemia    Anxiety    Asthma    HTN (hypertension)    Hyperlipidemia    borderline   Obese    OSA (obstructive sleep apnea)    mild ,no CPAP   Overweight(278.02)    Plantar wart    history   Prediabetes    Sleep apnea    Past Surgical History:  Procedure Laterality Date   BREAST BIOPSY Right 09/02/2015   CESAREAN SECTION     COLONOSCOPY  11/05/2018   COLONOSCOPY  2010   TUBAL LIGATION     Patient Active Problem List   Diagnosis Date Noted   Sinus bradycardia 10/18/2022   Hyperglycemia, unspecified 10/07/2022   Musculoskeletal back pain 06/07/2022   Murmur 01/07/2019   Insulin resistance 12/06/2016   Vitamin D deficiency 12/06/2016   High serum vitamin B12 12/06/2016   Class 1 obesity with serious comorbidity and body mass index (BMI) of 32.0 to 32.9 in adult 08/30/2016   PVC's (premature ventricular contractions) 04/13/2016   HH (hiatus hernia) 12/24/2015   Laryngopharyngeal reflux (LPR) 12/24/2015   PCP NOTES >>>>>>>>>>>>>>>>>>>> 05/20/2015   Annual physical exam 04/29/2014   OSA (obstructive sleep apnea) 09/25/2012   DJD (degenerative joint disease) 07/02/2010   Essential hypertension 05/21/2009   HYPERCHOLESTEROLEMIA, BORDERLINE 07/27/2007   Allergic rhinitis 07/27/2007   Asthma, chronic, moderate persistent, uncomplicated  01/11/2007    PCP: Wanda Plump, MD  REFERRING PROVIDER: Juanda Chance, NP  REFERRING DIAG: [phone] (ICD-10-CM) - Chronic bilateral thoracic back pain M79.18 (ICD-10-CM) - Myofascial pain syndrome  Rationale for Evaluation and Treatment: Rehabilitation  THERAPY DIAG:  Pain in thoracic spine  Abnormal posture  Muscle weakness (generalized)  ONSET DATE: Few months ago   SUBJECTIVE:  SUBJECTIVE STATEMENT: Pain is intermittent; more in upper thoracic spine but more discomfort stretch.  Pain seems improved this morning  PERTINENT HISTORY:  anxiety, asthma, HTN, OSA, obesity  PAIN:  Are you having pain? Yes: NPRS scale: 0 currently, up to /10 Pain location: mid thoracic Pain description: toothache, shock, "tearing" Aggravating factors: pickleball or trying to lift Relieving factors: sitting in chair with pressure to area, mobic  PRECAUTIONS:  None  RED FLAGS: None   WEIGHT BEARING RESTRICTIONS:  No  FALLS:  Has patient fallen in last 6 months? 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 No acute abnormality seen.  PATIENT SURVEYS:  12/21/22 FOTO 47 (predicted 61)  COGNITIVE STATUS: Within functional limits for tasks assessed   SENSATION: WFL  POSTURE:  rounded shoulders  HAND DOMINANCE:  Right  GAIT: 12/21/22 Comments:  independent  PALPATION: 12/21/22: pain T6-9 PA gr1 mobs    UPPER EXTREMITY MMT:  MMT Right eval Left eval  Shoulder flexion 3/5 3/5  Shoulder extension    Shoulder abduction 3/5 3/5  Lower trapezius 4/5 4/5   (Blank rows = not tested)   THORACOLUMBAR ROM:   Active  AROM  eval  Flexion Limited 20%  Extension WNL  Right lateral flexion WNL  Left lateral flexion WNL  Right rotation WNL  Left rotation WNL   (Blank rows = not tested)    TREATMENT:                                                                                                                              DATE:  01/13/23 TherEx NuStep L7 x 8 min Standing alternating shin taps with opp arm x10 reps bil Diagonals with 5# on cable 2x10 reps bil Rows L3 band 2x10; 5 sec hold Ball roll on wall for thoracic ext stretch 10 x 5 sec hold Doorway stretch 5x20 sec  01/03/23 TherEx Rows L3 band 2x10; 5 sec hold Extension L3 band 2x10; 5 sec hold Quadruped thread the needle 2x5 bil Prone "W" back x 10 reps  Manual IASTM with percussive device to upper thoracic paraspinals and rhomboids, TPR to active trigger points with 30-60 sec holds    12/21/22 See HEP - demonstrated with trial reps performed PRN for comprehension; did discuss DN but deferred today    PATIENT EDUCATION:  Education details: HEP, DN Person educated: Patient Education method: Explanation, Demonstration, and Handouts Education comprehension: verbalized understanding, returned demonstration, and needs further education  HOME EXERCISE PROGRAM: Access Code: [email or web address] Access Code: [email or web address] URL: [email or web address] Date: 01/03/2023 Prepared  by: Moshe Cipro  Exercises - Prone W Scapular Retraction  - 1 x daily - 7 x weekly - 3 sets - 10 reps - Seated Scapular Retraction  - 1 x daily - 7 x weekly - 1 sets - 10 reps - 5 sec hold - Seated Flexion Stretch with Swiss Ball  - 1 x daily - 7 x weekly - 1 sets - 5 reps -  10-15 sec hold - Seated Thoracic Flexion and Rotation with Swiss Ball  - 1 x daily - 7 x weekly - 1 sets - 5 reps - 10-15 sec hold - Seated Thoracic Lumbar Extension  - 1 x daily - 7 x weekly - 1 sets - 5 reps - 10-15 sec hold - Standing Paraspinals Mobilization with Small Ball on Wall  - 1-2 x daily - 7 x weekly - 1 sets - 1 reps - 2-3 min hold - Standing Shoulder Row with Anchored Resistance  - 1 x daily - 7 x weekly - 2 sets - 10 reps - Shoulder extension with resistance - Neutral  - 1 x daily - 7 x weekly - 2 sets - 10 reps - Shoulder External Rotation and Scapular Retraction with Resistance  - 1 x daily - 7 x weekly - 2 sets - 10 reps - 5 sec hold  Patient Education - Trigger Point Dry Needling   ASSESSMENT:  CLINICAL IMPRESSION: Pain continues to be decreased overall but still with some pain.  Encouraged her to try pickleball this weekend if able.  Will continue to benefit from PT to maximize function.   OBJECTIVE IMPAIRMENTS: decreased ROM, decreased strength, hypomobility, increased fascial restrictions, postural dysfunction, and pain.   ACTIVITY LIMITATIONS: carrying, lifting, bending, and locomotion level  PARTICIPATION LIMITATIONS: meal prep, cleaning, laundry, shopping, community activity, and yard work  PERSONAL FACTORS: 3+ comorbidities: anxiety, asthma, HTN, OSA, obesity  are also affecting patient's functional outcome.   REHAB POTENTIAL: Good  CLINICAL DECISION MAKING: Evolving/moderate complexity  EVALUATION COMPLEXITY: Moderate   GOALS: Goals reviewed with patient? Yes  SHORT TERM GOALS: Target date: 01/11/2023   Independent with initial HEP Goal status: INITIAL    LONG TERM GOALS: Target date: 02/01/2023  Independent with final HEP Goal status: INITIAL  2.  FOTO score improved to 61 Goal status: INITIAL  3.  Thoracolumbar flexion improved to WNL without pain for improved flexibility Goal status: INIITAL  4.  Report pain < 2/10 with light  pickleball activities for improved function Goal status: INITIAL    PLAN:  PT FREQUENCY: 1x/week  PT DURATION: 6 weeks  PLANNED INTERVENTIONS: 97164- PT Re-evaluation, 97110-Therapeutic exercises, 97530- Therapeutic activity, [id number]- Neuromuscular re-education, 97535- Self Care, 65784- Manual therapy, [id number]- Aquatic Therapy, 97014- Electrical stimulation (unattended), [id number]- Traction (mechanical), Patient/Family education, Taping, Dry Needling, Spinal manipulation, Spinal mobilization, Cryotherapy, and Moist heat.  PLAN FOR NEXT SESSION: DN if pt desires, work on postural and UB strengthening, did she try pickleball?   NEXT MD VISIT: 02/09/23   Clarita Crane, PT, DPT 01/13/23 10:10 AM   Date of referral: 12/08/22 Referring provider: Juanda Chance, NP  Referring diagnosis? M54.6,G89.29 (ICD-10-CM) - Chronic bilateral thoracic back pain M79.18 (ICD-10-CM) - Myofascial pain syndrome Treatment diagnosis? (if different than referring diagnosis) M54.6, R29.3, M62.81  What was this (referring dx) caused by? Unspecified  Nature of Condition: Initial Onset (within last 3 months)   Laterality: Both  Current Functional Measure Score: FOTO 47  Objective measurements identify impairments when they are compared to normal  values, the uninvolved extremity, and prior level of function.  [x]  Yes  []  No  Objective assessment of functional ability: Moderate functional limitations   Briefly describe symptoms: pain in thoracic spine  How did symptoms start: playing pickleball, lifting objects  Average pain intensity:  Last 24 hours: 3  Past week: 7  How often does the pt experience symptoms? Occasionally  How much have the symptoms interfered with usual daily activities? Moderately  How has condition changed since care began at this facility? A little better  In general, how is the patients overall health? Good   BACK PAIN (STarT Back Screening Tool) No

## 2023-01-16 ENCOUNTER — Encounter: Payer: Self-pay | Admitting: Physical Therapy

## 2023-01-16 ENCOUNTER — Ambulatory Visit: Payer: Medicare Other | Admitting: Physical Therapy

## 2023-01-16 DIAGNOSIS — M6281 Muscle weakness (generalized): Secondary | ICD-10-CM

## 2023-01-16 DIAGNOSIS — R293 Abnormal posture: Secondary | ICD-10-CM | POA: Diagnosis not present

## 2023-01-16 DIAGNOSIS — M546 Pain in thoracic spine: Secondary | ICD-10-CM | POA: Diagnosis not present

## 2023-01-16 NOTE — Therapy (Signed)
OUTPATIENT PHYSICAL THERAPY TREATMENT   Patient Name: Brenda Russo MRN: [id number] DOB:03-27-1955, 67 y.o., female Today's Date: 01/16/2023  END OF SESSION:  PT End of Session - 01/16/23 1304     Visit Number 4    Number of Visits 6    Date for PT Re-Evaluation 02/01/23    Authorization Type [employer] Medicare    Progress Note Due on Visit 10    PT Start Time 1300    PT Stop Time 1338    PT Time Calculation (min) 38 min    Activity Tolerance Patient tolerated treatment well    Behavior During Therapy WFL for tasks assessed/performed                Past Medical History:  Diagnosis Date   Allergic rhinitis    Allergy    Anemia    Anxiety    Asthma    HTN (hypertension)    Hyperlipidemia    borderline   Obese    OSA (obstructive sleep apnea)    mild ,no CPAP   Overweight(278.02)    Plantar wart    history   Prediabetes    Sleep apnea    Past Surgical History:  Procedure Laterality Date   BREAST BIOPSY Right 09/02/2015   CESAREAN SECTION     COLONOSCOPY  11/05/2018   COLONOSCOPY  2010   TUBAL LIGATION     Patient Active Problem List   Diagnosis Date Noted   Sinus bradycardia 10/18/2022   Hyperglycemia, unspecified 10/07/2022   Musculoskeletal back pain 06/07/2022   Murmur 01/07/2019   Insulin resistance 12/06/2016   Vitamin D deficiency 12/06/2016   High serum vitamin B12 12/06/2016   Class 1 obesity with serious comorbidity and body mass index (BMI) of 32.0 to 32.9 in adult 08/30/2016   PVC's (premature ventricular contractions) 04/13/2016   HH (hiatus hernia) 12/24/2015   Laryngopharyngeal reflux (LPR) 12/24/2015   PCP NOTES >>>>>>>>>>>>>>>>>>>> 05/20/2015   Annual physical exam 04/29/2014   OSA (obstructive sleep apnea) 09/25/2012   DJD (degenerative joint disease) 07/02/2010   Essential hypertension 05/21/2009   HYPERCHOLESTEROLEMIA, BORDERLINE 07/27/2007   Allergic rhinitis 07/27/2007   Asthma, chronic, moderate persistent, uncomplicated  01/11/2007    PCP: Wanda Plump, MD  REFERRING PROVIDER: Juanda Chance, NP  REFERRING DIAG: [phone] (ICD-10-CM) - Chronic bilateral thoracic back pain M79.18 (ICD-10-CM) - Myofascial pain syndrome  Rationale for Evaluation and Treatment: Rehabilitation  THERAPY DIAG:  Pain in thoracic spine  Abnormal posture  Muscle weakness (generalized)  ONSET DATE: Few months ago   SUBJECTIVE:  SUBJECTIVE STATEMENT: Was hurting more yesterday; but didn't try some pickleball.  She was doing some more cleaning and forward bending which may have aggravated it.   PERTINENT HISTORY:  anxiety, asthma, HTN, OSA, obesity  PAIN:  Are you having pain? Yes: NPRS scale: 6 currently, up to 10/10 Pain location: mid thoracic Pain description: toothache, shock, "tearing" Aggravating factors: pickleball or trying to lift Relieving factors: sitting in chair with pressure to area, mobic  PRECAUTIONS:  None  RED FLAGS: None   WEIGHT BEARING RESTRICTIONS:  No  FALLS:  Has patient fallen in last 6 months? 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 No acute abnormality seen.  PATIENT SURVEYS:  12/21/22 FOTO 47 (predicted 61)  COGNITIVE STATUS: Within functional limits for tasks assessed   SENSATION: WFL  POSTURE:  rounded shoulders  HAND DOMINANCE:   Right  GAIT: 12/21/22 Comments: independent  PALPATION: 12/21/22: pain T6-9 PA gr1 mobs    UPPER EXTREMITY MMT:  MMT Right eval Left eval  Shoulder flexion 3/5 3/5  Shoulder extension    Shoulder abduction 3/5 3/5  Lower trapezius 4/5 4/5   (Blank rows = not tested)   THORACOLUMBAR ROM:   Active  AROM  eval  Flexion Limited 20%  Extension WNL  Right lateral flexion WNL  Left lateral flexion WNL  Right rotation WNL  Left rotation WNL   (Blank rows = not tested)    TREATMENT:                                                                                                                              DATE:  01/16/23 TherEx SciFit UE/LEs L5 x 8 min Physioball roll outs forward and to the Rt 5x10 sec hold Seated upper trap/levator stretch 3x20 sec    Manual STM with compression to Lt upper trap and levator scapula; skilled palpation and monitoring of soft tissue during DN Trigger Point Dry-Needling  Treatment instructions: Expect mild to moderate muscle soreness. S/S of pneumothorax if dry needled over a lung field, and to seek immediate medical attention should they occur. Patient verbalized understanding of these instructions and education.  Patient Consent Given: Yes Education handout provided: Yes Muscles treated: Lt levator scapula, Lt upper trap Electrical stimulation performed: No Parameters: N/A Treatment response/outcome: twitch responses noted  01/13/23 TherEx NuStep L7 x 8 min Standing alternating shin taps with opp arm x10 reps bil Diagonals with 5# on cable 2x10 reps bil Rows L3 band 2x10; 5 sec hold Ball roll on wall for thoracic ext stretch 10  x 5 sec hold Doorway stretch 5x20 sec  01/03/23 TherEx Rows L3 band 2x10; 5 sec hold Extension L3 band 2x10; 5 sec hold Quadruped thread the needle 2x5 bil Prone "W" back x 10 reps  Manual IASTM with percussive device to upper thoracic paraspinals and rhomboids, TPR to active trigger points with  30-60 sec holds    12/21/22 See HEP - demonstrated with trial reps performed PRN for comprehension; did discuss DN but deferred today    PATIENT EDUCATION:  Education details: HEP, DN Person educated: Patient Education method: Explanation, Demonstration, and Handouts Education comprehension: verbalized understanding, returned demonstration, and needs further education  HOME EXERCISE PROGRAM: Access Code: [email or web address] Access Code: [email or web address] URL: [email or web address] Date: 01/03/2023 Prepared by: Moshe Cipro  Exercises - Prone W Scapular Retraction  - 1 x daily - 7 x weekly - 3 sets - 10 reps - Seated Scapular Retraction  - 1 x daily - 7 x weekly - 1 sets - 10 reps - 5 sec hold - Seated Flexion Stretch with Swiss Ball  - 1 x daily - 7 x weekly - 1 sets - 5 reps - 10-15 sec hold - Seated Thoracic Flexion and Rotation with Swiss Ball  - 1 x daily - 7 x weekly - 1 sets - 5 reps - 10-15 sec hold - Seated Thoracic Lumbar Extension  - 1 x daily - 7 x weekly - 1 sets - 5 reps - 10-15 sec hold - Standing Paraspinals Mobilization with Small Ball on Wall  - 1-2 x daily - 7 x weekly - 1 sets - 1 reps - 2-3 min hold - Standing Shoulder Row with Anchored Resistance  - 1 x daily - 7 x weekly - 2 sets - 10 reps - Shoulder extension with resistance - Neutral  - 1 x daily - 7 x weekly - 2 sets - 10 reps - Shoulder External Rotation and Scapular Retraction with Resistance  - 1 x daily - 7 x weekly - 2 sets - 10 reps - 5 sec hold  Patient Education - Trigger Point Dry Needling   ASSESSMENT:  CLINICAL IMPRESSION: Trial of dry needling today with manual therapy to see if this helps with her pain. Overall she's getting better despite recent flare up.  Will continue to benefit from PT to maximize function.   OBJECTIVE IMPAIRMENTS: decreased ROM, decreased strength, hypomobility, increased fascial restrictions, postural dysfunction, and pain.   ACTIVITY LIMITATIONS: carrying,  lifting, bending, and locomotion level  PARTICIPATION LIMITATIONS: meal prep, cleaning, laundry, shopping, community activity, and yard work  PERSONAL FACTORS: 3+ comorbidities: anxiety, asthma, HTN, OSA, obesity  are also affecting patient's functional outcome.   REHAB POTENTIAL: Good  CLINICAL DECISION MAKING: Evolving/moderate complexity  EVALUATION COMPLEXITY: Moderate   GOALS: Goals reviewed with patient? Yes  SHORT TERM GOALS: Target date: 01/11/2023   Independent with initial HEP Goal status: MET 01/16/23    LONG TERM GOALS: Target date: 02/01/2023  Independent with final HEP Goal status: INITIAL  2.  FOTO score improved to 61 Goal status: INITIAL  3.  Thoracolumbar flexion improved to WNL without pain for improved flexibility Goal status: INIITAL  4.  Report pain < 2/10 with light pickleball activities for improved function Goal status: INITIAL    PLAN:  PT FREQUENCY: 1x/week  PT DURATION: 6 weeks  PLANNED INTERVENTIONS: 97164- PT Re-evaluation, 97110-Therapeutic exercises, 97530- Therapeutic activity, [id number]- Neuromuscular re-education, 97535- Self Care, 16109- Manual therapy, [id number]- Aquatic Therapy, 97014- Electrical stimulation (unattended), 60454- Traction (mechanical), Patient/Family  education, Taping, Dry Needling, Spinal manipulation, Spinal mobilization, Cryotherapy, and Moist heat.  PLAN FOR NEXT SESSION: assess response to DN and repeat PTN, work on postural and UB strengthening, did she try pickleball?   NEXT MD VISIT: 02/09/23   Clarita Crane, PT, DPT 01/16/23 1:41 PM   Date of referral: 12/08/22 Referring provider: Juanda Chance, NP  Referring diagnosis? M54.6,G89.29 (ICD-10-CM) - Chronic bilateral thoracic back pain M79.18 (ICD-10-CM) - Myofascial pain syndrome Treatment diagnosis? (if different than referring diagnosis) M54.6, R29.3, M62.81  What was this (referring dx) caused by? Unspecified  Nature of Condition: Initial  Onset (within last 3 months)   Laterality: Both  Current Functional Measure Score: FOTO 47  Objective measurements identify impairments when they are compared to normal values, the uninvolved extremity, and prior level of function.  [x]  Yes  []  No  Objective assessment of functional ability: Moderate functional limitations   Briefly describe symptoms: pain in thoracic spine  How did symptoms start: playing pickleball, lifting objects  Average pain intensity:  Last 24 hours: 3  Past week: 7  How often does the pt experience symptoms? Occasionally  How much have the symptoms interfered with usual daily activities? Moderately  How has condition changed since care began at this facility? A little better  In general, how is the patients overall health? Good   BACK PAIN (STarT Back Screening Tool) No

## 2023-01-21 LAB — ALKALINE PHOSPHATASE ISOENZYMES
Alkaline phosphatase (APISO): 87 U/L (ref 37–153)
Bone Isoenzymes: 45 % (ref 28–66)
Intestinal Isoenzymes: 0 % — ABNORMAL LOW (ref 1–24)
Liver Isoenzymes: 55 % (ref 25–69)
Macrohepatic isoenzymes: 0 % (ref <=0)
Placental isoenzymes: 0 % (ref <=0)

## 2023-01-23 ENCOUNTER — Encounter: Payer: Self-pay | Admitting: Physical Therapy

## 2023-01-23 ENCOUNTER — Ambulatory Visit: Payer: Medicare Other | Admitting: Physical Therapy

## 2023-01-23 DIAGNOSIS — M6281 Muscle weakness (generalized): Secondary | ICD-10-CM | POA: Diagnosis not present

## 2023-01-23 DIAGNOSIS — M546 Pain in thoracic spine: Secondary | ICD-10-CM | POA: Diagnosis not present

## 2023-01-23 DIAGNOSIS — R293 Abnormal posture: Secondary | ICD-10-CM | POA: Diagnosis not present

## 2023-01-23 NOTE — Therapy (Signed)
OUTPATIENT PHYSICAL THERAPY TREATMENT   Patient Name: Brenda Russo MRN: [id number] DOB:1955/05/22, 67 y.o., female Today's Date: 01/23/2023  END OF SESSION:  PT End of Session - 01/23/23 1341     Visit Number 5    Number of Visits 6    Date for PT Re-Evaluation 02/01/23    Authorization Type UHC Medicare    Progress Note Due on Visit 10    PT Start Time 1341    PT Stop Time 1422    PT Time Calculation (min) 41 min    Activity Tolerance Patient tolerated treatment well    Behavior During Therapy WFL for tasks assessed/performed                 Past Medical History:  Diagnosis Date   Allergic rhinitis    Allergy    Anemia    Anxiety    Asthma    HTN (hypertension)    Hyperlipidemia    borderline   Obese    OSA (obstructive sleep apnea)    mild ,no CPAP   Overweight(278.02)    Plantar wart    history   Prediabetes    Sleep apnea    Past Surgical History:  Procedure Laterality Date   BREAST BIOPSY Right 09/02/2015   CESAREAN SECTION     COLONOSCOPY  11/05/2018   COLONOSCOPY  2010   TUBAL LIGATION     Patient Active Problem List   Diagnosis Date Noted   Sinus bradycardia 10/18/2022   Hyperglycemia, unspecified 10/07/2022   Musculoskeletal back pain 06/07/2022   Murmur 01/07/2019   Insulin resistance 12/06/2016   Vitamin D deficiency 12/06/2016   High serum vitamin B12 12/06/2016   Class 1 obesity with serious comorbidity and body mass index (BMI) of 32.0 to 32.9 in adult 08/30/2016   PVC's (premature ventricular contractions) 04/13/2016   HH (hiatus hernia) 12/24/2015   Laryngopharyngeal reflux (LPR) 12/24/2015   PCP NOTES >>>>>>>>>>>>>>>>>>>> 05/20/2015   Annual physical exam 04/29/2014   OSA (obstructive sleep apnea) 09/25/2012   DJD (degenerative joint disease) 07/02/2010   Essential hypertension 05/21/2009   HYPERCHOLESTEROLEMIA, BORDERLINE 07/27/2007   Allergic rhinitis 07/27/2007   Asthma, chronic, moderate persistent,  uncomplicated 01/11/2007    PCP: Wanda Plump, MD  REFERRING PROVIDER: Juanda Chance, NP  REFERRING DIAG: [phone] (ICD-10-CM) - Chronic bilateral thoracic back pain M79.18 (ICD-10-CM) - Myofascial pain syndrome  Rationale for Evaluation and Treatment: Rehabilitation  THERAPY DIAG:  Pain in thoracic spine  Abnormal posture  Muscle weakness (generalized)  ONSET DATE: Few months ago   SUBJECTIVE:  SUBJECTIVE STATEMENT: Feeling "pretty good."  Pain is mild today.   PERTINENT HISTORY:  anxiety, asthma, HTN, OSA, obesity  PAIN:  Are you having pain? Yes: NPRS scale: 3 currently, up to 10/10 Pain location: mid thoracic Pain description: toothache, shock, "tearing" Aggravating factors: pickleball or trying to lift Relieving factors: sitting in chair with pressure to area, mobic  PRECAUTIONS:  None  RED FLAGS: None   WEIGHT BEARING RESTRICTIONS:  No  FALLS:  Has patient fallen in last 6 months? 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 No acute abnormality seen.  PATIENT SURVEYS:  12/21/22 FOTO 47 (predicted 61)  COGNITIVE STATUS: Within functional limits for tasks assessed   SENSATION: WFL  POSTURE:  rounded shoulders  HAND DOMINANCE:  Right  GAIT: 12/21/22 Comments: independent  PALPATION: 12/21/22: pain T6-9 PA gr1 mobs    UPPER  EXTREMITY MMT:  MMT Right eval Left eval  Shoulder flexion 3/5 3/5  Shoulder extension    Shoulder abduction 3/5 3/5  Lower trapezius 4/5 4/5   (Blank rows = not tested)   THORACOLUMBAR ROM:   Active  AROM  eval  Flexion Limited 20%  Extension WNL  Right lateral flexion WNL  Left lateral flexion WNL  Right rotation WNL  Left rotation WNL   (Blank rows = not tested)    TREATMENT:                                                                                                                              DATE:  01/23/23 TherEx NuStep L3-7 x 8 min Rows L3 band 2x10 Shoulder ext L3 band 2x10 Bil ER with scapular retraction 2x10; L3 band Ball roll on wall for thoracic ext stretch 10 x 5 sec hold Hands on cabinet with opp overhead reach/hip extension x 10 bil; 3 sec hold  TherAct Pickleball swings with paddle and badminton birdies x 8 min; no increase in pain or symptoms  01/16/23 TherEx SciFit UE/LEs L5 x 8 min Physioball roll outs forward and to the Rt 5x10 sec hold Seated upper trap/levator stretch 3x20 sec    Manual STM with compression to Lt upper trap and levator scapula; skilled palpation and monitoring of soft tissue during DN Trigger Point Dry-Needling  Treatment instructions: Expect mild to moderate muscle soreness. S/S of pneumothorax if dry needled over a lung field, and to seek immediate medical attention should they occur. Patient verbalized understanding of these instructions and education.  Patient Consent Given: Yes Education handout provided: Yes Muscles treated: Lt levator scapula, Lt upper  trap Electrical stimulation performed: No Parameters: N/A Treatment response/outcome: twitch responses noted  01/13/23 TherEx NuStep L7 x 8 min Standing alternating shin taps with opp arm x10 reps bil Diagonals with 5# on cable 2x10 reps bil Rows L3 band 2x10; 5 sec hold Ball roll on wall for thoracic ext stretch 10 x 5 sec hold Doorway stretch 5x20  sec   PATIENT EDUCATION:  Education details: HEP, DN Person educated: Patient Education method: Programmer, multimedia, Demonstration, and Handouts Education comprehension: verbalized understanding, returned demonstration, and needs further education  HOME EXERCISE PROGRAM: Access Code: [email or web address] URL: [email or web address] Date: 01/03/2023 Prepared by: Moshe Cipro  Exercises - Prone W Scapular Retraction  - 1 x daily - 7 x weekly - 3 sets - 10 reps - Seated Scapular Retraction  - 1 x daily - 7 x weekly - 1 sets - 10 reps - 5 sec hold - Seated Flexion Stretch with Swiss Ball  - 1 x daily - 7 x weekly - 1 sets - 5 reps - 10-15 sec hold - Seated Thoracic Flexion and Rotation with Swiss Ball  - 1 x daily - 7 x weekly - 1 sets - 5 reps - 10-15 sec hold - Seated Thoracic Lumbar Extension  - 1 x daily - 7 x weekly - 1 sets - 5 reps - 10-15 sec hold - Standing Paraspinals Mobilization with Small Ball on Wall  - 1-2 x daily - 7 x weekly - 1 sets - 1 reps - 2-3 min hold - Standing Shoulder Row with Anchored Resistance  - 1 x daily - 7 x weekly - 2 sets - 10 reps - Shoulder extension with resistance - Neutral  - 1 x daily - 7 x weekly - 2 sets - 10 reps - Shoulder External Rotation and Scapular Retraction with Resistance  - 1 x daily - 7 x weekly - 2 sets - 10 reps - 5 sec hold  Patient Education - Trigger Point Dry Needling   ASSESSMENT:  CLINICAL IMPRESSION: Pt tolerated pickleball swings well today and encouraged her to try at home.  Anticipate we are nearing d/c from PT at this time.     OBJECTIVE IMPAIRMENTS: decreased ROM, decreased strength, hypomobility, increased fascial restrictions, postural dysfunction, and pain.   ACTIVITY LIMITATIONS: carrying, lifting, bending, and locomotion level  PARTICIPATION LIMITATIONS: meal prep, cleaning, laundry, shopping, community activity, and yard work  PERSONAL FACTORS: 3+ comorbidities: anxiety, asthma, HTN, OSA, obesity  are also  affecting patient's functional outcome.   REHAB POTENTIAL: Good  CLINICAL DECISION MAKING: Evolving/moderate complexity  EVALUATION COMPLEXITY: Moderate   GOALS: Goals reviewed with patient? Yes  SHORT TERM GOALS: Target date: 01/11/2023   Independent with initial HEP Goal status: MET 01/16/23    LONG TERM GOALS: Target date: 02/01/2023  Independent with final HEP Goal status: INITIAL  2.  FOTO score improved to 61 Goal status: INITIAL  3.  Thoracolumbar flexion improved to WNL without pain for improved flexibility Goal status: INIITAL  4.  Report pain < 2/10 with light pickleball activities for improved function Goal status: INITIAL    PLAN:  PT FREQUENCY: 1x/week  PT DURATION: 6 weeks  PLANNED INTERVENTIONS: 97164- PT Re-evaluation, 97110-Therapeutic exercises, 97530- Therapeutic activity, [id number]- Neuromuscular re-education, 97535- Self Care, 41324- Manual therapy, [id number]- Aquatic Therapy, 97014- Electrical stimulation (unattended), [id number]- Traction (mechanical), Patient/Family education, Taping, Dry Needling, Spinal manipulation, Spinal mobilization, Cryotherapy, and Moist heat.  PLAN FOR NEXT SESSION: d/c or recert based on symptoms,  assess response to DN and  repeat PTN, work on postural and UB strengthening, did she try pickleball?   NEXT MD VISIT: 02/09/23   Clarita Crane, PT, DPT 01/23/23 2:23 PM   Date of referral: 12/08/22 Referring provider: Juanda Chance, NP  Referring diagnosis? M54.6,G89.29 (ICD-10-CM) - Chronic bilateral thoracic back pain M79.18 (ICD-10-CM) - Myofascial pain syndrome Treatment diagnosis? (if different than referring diagnosis) M54.6, R29.3, M62.81  What was this (referring dx) caused by? Unspecified  Nature of Condition: Initial Onset (within last 3 months)   Laterality: Both  Current Functional Measure Score: FOTO 47  Objective measurements identify impairments when they are compared to normal values, the  uninvolved extremity, and prior level of function.  [x]  Yes  []  No  Objective assessment of functional ability: Moderate functional limitations   Briefly describe symptoms: pain in thoracic spine  How did symptoms start: playing pickleball, lifting objects  Average pain intensity:  Last 24 hours: 3  Past week: 7  How often does the pt experience symptoms? Occasionally  How much have the symptoms interfered with usual daily activities? Moderately  How has condition changed since care began at this facility? A little better  In general, how is the patients overall health? Good   BACK PAIN (STarT Back Screening Tool) No

## 2023-01-30 ENCOUNTER — Encounter (HOSPITAL_COMMUNITY): Payer: Self-pay

## 2023-01-30 DIAGNOSIS — Z79899 Other long term (current) drug therapy: Secondary | ICD-10-CM | POA: Diagnosis not present

## 2023-01-30 DIAGNOSIS — I10 Essential (primary) hypertension: Secondary | ICD-10-CM | POA: Diagnosis not present

## 2023-01-30 DIAGNOSIS — I429 Cardiomyopathy, unspecified: Secondary | ICD-10-CM | POA: Diagnosis not present

## 2023-01-31 LAB — BASIC METABOLIC PANEL
BUN/Creatinine Ratio: 19 (ref 12–28)
BUN: 13 mg/dL (ref 8–27)
CO2: 23 mmol/L (ref 20–29)
Calcium: 9 mg/dL (ref 8.7–10.3)
Chloride: 104 mmol/L (ref 96–106)
Creatinine, Ser: 0.67 mg/dL (ref 0.57–1.00)
Glucose: 124 mg/dL — ABNORMAL HIGH (ref 70–99)
Potassium: 4.7 mmol/L (ref 3.5–5.2)
Sodium: 140 mmol/L (ref 134–144)
eGFR: 96 mL/min/{1.73_m2} (ref >59)

## 2023-02-06 ENCOUNTER — Ambulatory Visit (HOSPITAL_COMMUNITY): Payer: Medicare Other | Attending: Internal Medicine

## 2023-02-06 DIAGNOSIS — I429 Cardiomyopathy, unspecified: Secondary | ICD-10-CM | POA: Insufficient documentation

## 2023-02-06 DIAGNOSIS — Z79899 Other long term (current) drug therapy: Secondary | ICD-10-CM | POA: Insufficient documentation

## 2023-02-06 DIAGNOSIS — I493 Ventricular premature depolarization: Secondary | ICD-10-CM | POA: Insufficient documentation

## 2023-02-07 ENCOUNTER — Encounter: Payer: Self-pay | Admitting: Physical Therapy

## 2023-02-07 ENCOUNTER — Ambulatory Visit: Payer: Medicare Other | Admitting: Physical Therapy

## 2023-02-07 DIAGNOSIS — M6281 Muscle weakness (generalized): Secondary | ICD-10-CM | POA: Diagnosis not present

## 2023-02-07 DIAGNOSIS — R293 Abnormal posture: Secondary | ICD-10-CM | POA: Diagnosis not present

## 2023-02-07 DIAGNOSIS — M546 Pain in thoracic spine: Secondary | ICD-10-CM | POA: Diagnosis not present

## 2023-02-07 LAB — EXERCISE TOLERANCE TEST
Angina Index: 0
Duke Treadmill Score: 7
Estimated workload: 8.5
Exercise duration (min): 7 min
MPHR: 154 {beats}/min
Peak HR: 146 {beats}/min
Percent HR: 95 %
RPE: 19
Rest HR: 65 {beats}/min
ST Depression (mm): 0 mm

## 2023-02-07 NOTE — Therapy (Addendum)
 OUTPATIENT PHYSICAL THERAPY TREATMENT RECERTIFICATION DISCHARGE SUMMARY   Patient Name: Brenda Russo MRN: [id number] DOB:12-26-1955, 67 y.o., female Today's Date: 02/07/2023  END OF SESSION:  PT End of Session - 02/07/23 1258     Visit Number 6    Number of Visits 6    Date for PT Re-Evaluation 03/07/23    Authorization Type [employer] Medicare    Progress Note Due on Visit 10    PT Start Time 1300    Activity Tolerance Patient tolerated treatment well    Behavior During Therapy WFL for tasks assessed/performed                  Past Medical History:  Diagnosis Date   Allergic rhinitis    Allergy    Anemia    Anxiety    Asthma    HTN (hypertension)    Hyperlipidemia    borderline   Obese    OSA (obstructive sleep apnea)    mild ,no CPAP   Overweight(278.02)    Plantar wart    history   Prediabetes    Sleep apnea    Past Surgical History:  Procedure Laterality Date   BREAST BIOPSY Right 09/02/2015   CESAREAN SECTION     COLONOSCOPY  11/05/2018   COLONOSCOPY  2010   TUBAL LIGATION     Patient Active Problem List   Diagnosis Date Noted   Sinus bradycardia 10/18/2022   Hyperglycemia, unspecified 10/07/2022   Musculoskeletal back pain 06/07/2022   Murmur 01/07/2019   Insulin  resistance 12/06/2016   Vitamin D  deficiency 12/06/2016   High serum vitamin B12 12/06/2016   Class 1 obesity with serious comorbidity and body mass index (BMI) of 32.0 to 32.9 in adult 08/30/2016   PVC's (premature ventricular contractions) 04/13/2016   HH (hiatus hernia) 12/24/2015   Laryngopharyngeal reflux (LPR) 12/24/2015   PCP NOTES >>>>>>>>>>>>>>>>>>>> 05/20/2015   Annual physical exam 04/29/2014   OSA (obstructive sleep apnea) 09/25/2012   DJD (degenerative joint disease) 07/02/2010   Essential hypertension 05/21/2009   HYPERCHOLESTEROLEMIA, BORDERLINE 07/27/2007   Allergic rhinitis 07/27/2007   Asthma, chronic, moderate persistent, uncomplicated 01/11/2007     PCP: Ezell Hollow, MD  REFERRING PROVIDER: Darryll Eng, NP  REFERRING DIAG: [phone] (ICD-10-CM) - Chronic bilateral thoracic back pain M79.18 (ICD-10-CM) - Myofascial pain syndrome  Rationale for Evaluation and Treatment: Rehabilitation  THERAPY DIAG:  Pain in thoracic spine  Abnormal posture  Muscle weakness (generalized)  ONSET DATE: Few months ago   SUBJECTIVE:  SUBJECTIVE STATEMENT: Reaching out causes a pain but only at certain times.   PERTINENT HISTORY:  anxiety, asthma, HTN, OSA, obesity  PAIN:  Are you having pain? Yes: NPRS scale: 3 currently, up to 10/10 Pain location: mid thoracic Pain description: toothache, shock, "tearing" Aggravating factors: pickleball or trying to lift Relieving factors: sitting in chair with pressure to area, mobic   PRECAUTIONS:  None  RED FLAGS: None   WEIGHT BEARING RESTRICTIONS:  No  FALLS:  Has patient fallen in last 6 months? 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 No acute abnormality seen.  PATIENT SURVEYS:  12/21/22 FOTO 47 (predicted 61) 02/07/23: FOTO 57  COGNITIVE STATUS: Within functional limits for tasks assessed   SENSATION: WFL  POSTURE:  rounded shoulders  HAND DOMINANCE:  Right  GAIT: 12/21/22 Comments: independent  PALPATION: 12/21/22: pain T6-9 PA gr1 mobs     UPPER EXTREMITY MMT:  MMT Right eval Left eval  Shoulder flexion 3/5 3/5  Shoulder extension    Shoulder abduction 3/5 3/5  Lower trapezius 4/5 4/5   (Blank rows = not tested)   THORACOLUMBAR ROM:   Active  AROM  eval AROM 02/07/23  Flexion Limited 20% WNL  Extension WNL   Right lateral flexion WNL   Left lateral flexion WNL   Right rotation WNL   Left rotation WNL    (Blank rows = not tested)    TREATMENT:                                                                                                                              DATE:  02/07/23 TherEx NuStep L5 x 8 min; UEs/LEs ROM assessment - see above for details Discussed current HEP as well as light stretches/movements to do in the morning and evening when pain seems to be more elevated.  Overall doing well and feels comfortable with current progress.   01/23/23 TherEx NuStep L3-7 x 8 min Rows L3 band 2x10 Shoulder ext L3 band 2x10 Bil ER with scapular retraction 2x10; L3 band Ball roll on wall for thoracic ext stretch 10 x 5 sec hold Hands on cabinet with opp overhead reach/hip extension x 10 bil; 3 sec hold  TherAct Pickleball swings with paddle and badminton birdies x 8 min; no increase in pain or symptoms  01/16/23 TherEx SciFit UE/LEs L5 x 8 min Physioball roll outs forward and to the Rt 5x10 sec hold Seated upper trap/levator stretch 3x20 sec    Manual STM with compression to Lt upper trap and levator scapula; skilled palpation  and monitoring of soft tissue during DN Trigger Point Dry-Needling  Treatment instructions: Expect mild to moderate muscle soreness. S/S of pneumothorax if dry needled over a lung field, and to seek immediate medical attention should they occur. Patient verbalized understanding of these instructions and education.  Patient Consent Given: Yes Education handout provided: Yes Muscles treated: Lt levator scapula, Lt upper trap Electrical stimulation performed:  No Parameters: N/A Treatment response/outcome: twitch responses noted  01/13/23 TherEx NuStep L7 x 8 min Standing alternating shin taps with opp arm x10 reps bil Diagonals with 5# on cable 2x10 reps bil Rows L3 band 2x10; 5 sec hold Ball roll on wall for thoracic ext stretch 10 x 5 sec hold Doorway stretch 5x20 sec   PATIENT EDUCATION:  Education details: HEP, DN Person educated: Patient Education method: Programmer, multimedia, Demonstration, and Handouts Education comprehension: verbalized understanding, returned demonstration, and needs further education  HOME EXERCISE PROGRAM: Access Code: [email or web address] URL: [email or web address] Date: 01/03/2023 Prepared by: Casimer Clear  Exercises - Prone W Scapular Retraction  - 1 x daily - 7 x weekly - 3 sets - 10 reps - Seated Scapular Retraction  - 1 x daily - 7 x weekly - 1 sets - 10 reps - 5 sec hold - Seated Flexion Stretch with Swiss Ball  - 1 x daily - 7 x weekly - 1 sets - 5 reps - 10-15 sec hold - Seated Thoracic Flexion and Rotation with Swiss Ball  - 1 x daily - 7 x weekly - 1 sets - 5 reps - 10-15 sec hold - Seated Thoracic Lumbar Extension  - 1 x daily - 7 x weekly - 1 sets - 5 reps - 10-15 sec hold - Standing Paraspinals Mobilization with Small Ball on Wall  - 1-2 x daily - 7 x weekly - 1 sets - 1 reps - 2-3 min hold - Standing Shoulder Row with Anchored Resistance  - 1 x daily - 7 x weekly - 2 sets - 10 reps - Shoulder extension with resistance - Neutral  - 1 x daily - 7 x weekly - 2 sets - 10 reps - Shoulder External Rotation and Scapular Retraction with Resistance  - 1 x daily - 7 x weekly - 2 sets - 10 reps - 5 sec hold  Patient Education - Trigger Point Dry Needling   ASSESSMENT:  CLINICAL IMPRESSION: Pt has met/nearly met all LTGs at this time and feel she is ready to transition to independent program only.  Will hold PT and pt will call if symptoms return.     OBJECTIVE IMPAIRMENTS: decreased ROM,  decreased strength, hypomobility, increased fascial restrictions, postural dysfunction, and pain.   ACTIVITY LIMITATIONS: carrying, lifting, bending, and locomotion level  PARTICIPATION LIMITATIONS: meal prep, cleaning, laundry, shopping, community activity, and yard work  PERSONAL FACTORS: 3+ comorbidities: anxiety, asthma, HTN, OSA, obesity  are also affecting patient's functional outcome.   REHAB POTENTIAL: Good  CLINICAL DECISION MAKING: Evolving/moderate complexity  EVALUATION COMPLEXITY: Moderate   GOALS: Goals reviewed with patient? Yes  SHORT TERM GOALS: Target date: 01/11/2023   Independent with initial HEP Goal status: MET 01/16/23    LONG TERM GOALS: Target date: 02/01/2023  Independent with final HEP Goal status: MET 02/07/23  2.  FOTO score improved to 61 Goal status: ONGOING 02/07/23  3.  Thoracolumbar flexion improved to WNL without pain for improved flexibility Goal status: MET 02/07/23  4.  Report pain < 2/10 with light pickleball activities for improved function Goal status: MET 02/07/23  PLAN:  PT FREQUENCY: 1x/week  PT DURATION: 6 weeks  PLANNED INTERVENTIONS: 97164- PT Re-evaluation, 97110-Therapeutic exercises, 97530- Therapeutic activity, [id number]- Neuromuscular re-education, 97535- Self Care, 22025- Manual therapy, [id number]- Aquatic Therapy, 97014- Electrical stimulation (unattended), [phone]- Traction (mechanical), Patient/Family education, Taping, Dry Needling, Spinal manipulation, Spinal mobilization, Cryotherapy, and Moist heat.  PLAN FOR NEXT SESSION: hold PT x 30 days; reassess if pt returns or d/c   NEXT MD VISIT: 02/09/23   Marley Simmers, PT, DPT 02/07/23 1:16 PM   Date of referral: 12/08/22 Referring provider: Darryll Eng, NP  Referring diagnosis? M54.6,G89.29 (ICD-10-CM) - Chronic bilateral thoracic back pain M79.18 (ICD-10-CM) - Myofascial pain syndrome Treatment diagnosis? (if different than referring diagnosis)  M54.6, R29.3, M62.81  What was this (referring dx) caused by? Unspecified  Nature of Condition: Initial Onset (within last 3 months)   Laterality: Both  Current Functional Measure Score: FOTO 47  Objective measurements identify impairments when they are compared to normal values, the uninvolved extremity, and prior level of function.  [x]  Yes  []  No  Objective assessment of functional ability: Moderate functional limitations   Briefly describe symptoms: pain in thoracic spine  How did symptoms start: playing pickleball, lifting objects  Average pain intensity:  Last 24 hours: 3  Past week: 7  How often does the pt experience symptoms? Occasionally  How much have the symptoms interfered with usual daily activities? Moderately  How has condition changed since care began at this facility? A little better  In general, how is the patients overall health? Good   BACK PAIN (STarT Back Screening Tool) No     PHYSICAL THERAPY DISCHARGE SUMMARY  Visits from Start of Care: 6  Current functional level related to goals / functional outcomes: See above   Remaining deficits: See above   Education / Equipment: HEP   Patient agrees to discharge. Patient goals were met. Patient is being discharged due to meeting the stated rehab goals.  Marley Simmers, PT, DPT 05/29/23 12:49 PM  [facility] [address] [place], [place], [address] Phone: [phone]   Fax:  [phone]

## 2023-02-09 ENCOUNTER — Ambulatory Visit: Payer: Medicare Other | Admitting: Physical Medicine and Rehabilitation

## 2023-02-09 ENCOUNTER — Other Ambulatory Visit (HOSPITAL_COMMUNITY): Payer: Self-pay

## 2023-02-09 MED ORDER — TRIAZOLAM 0.25 MG PO TABS
0.5000 mg | ORAL_TABLET | ORAL | 0 refills | Status: DC | PRN
  Filled 2023-02-09: qty 2, 1d supply, fill #0

## 2023-02-17 DIAGNOSIS — I493 Ventricular premature depolarization: Secondary | ICD-10-CM

## 2023-02-24 DIAGNOSIS — I493 Ventricular premature depolarization: Secondary | ICD-10-CM | POA: Diagnosis not present

## 2023-03-06 ENCOUNTER — Other Ambulatory Visit (HOSPITAL_COMMUNITY): Payer: Self-pay

## 2023-03-06 MED ORDER — CHLORHEXIDINE GLUCONATE 0.12 % MT SOLN
OROMUCOSAL | 2 refills | Status: DC
  Filled 2023-03-06: qty 473, 15d supply, fill #0

## 2023-03-06 MED ORDER — HYDROCODONE-ACETAMINOPHEN 5-325 MG PO TABS
1.0000 | ORAL_TABLET | Freq: Four times a day (QID) | ORAL | 0 refills | Status: DC | PRN
  Filled 2023-03-06: qty 18, 5d supply, fill #0

## 2023-03-06 MED ORDER — AMOXICILLIN-POT CLAVULANATE 875-125 MG PO TABS
1.0000 | ORAL_TABLET | Freq: Two times a day (BID) | ORAL | 0 refills | Status: AC
  Filled 2023-03-06: qty 20, 10d supply, fill #0

## 2023-03-20 ENCOUNTER — Other Ambulatory Visit (HOSPITAL_COMMUNITY): Payer: Self-pay

## 2023-03-20 MED ORDER — CLINDAMYCIN HCL 300 MG PO CAPS
300.0000 mg | ORAL_CAPSULE | Freq: Three times a day (TID) | ORAL | 0 refills | Status: DC
Start: 2023-03-20 — End: 2023-04-21
  Filled 2023-03-20: qty 21, 7d supply, fill #0

## 2023-04-07 ENCOUNTER — Other Ambulatory Visit: Payer: Self-pay

## 2023-04-07 ENCOUNTER — Other Ambulatory Visit (HOSPITAL_COMMUNITY): Payer: Self-pay

## 2023-04-07 ENCOUNTER — Other Ambulatory Visit: Payer: Self-pay | Admitting: Internal Medicine

## 2023-04-07 MED ORDER — ALBUTEROL SULFATE HFA 108 (90 BASE) MCG/ACT IN AERS
2.0000 | INHALATION_SPRAY | RESPIRATORY_TRACT | 5 refills | Status: AC | PRN
Start: 2023-04-07 — End: ?
  Filled 2023-04-07: qty 6.7, 25d supply, fill #0

## 2023-04-07 NOTE — Telephone Encounter (Signed)
 Requesting:   albuterol (VENTOLIN HFA) 108 (90 Base) MCG/ACT inhaler   Last Visit: 01/10/2023 Next Visit: 10/10/2023 Last Refill: 10/21/2021  Please Advise

## 2023-04-12 ENCOUNTER — Ambulatory Visit: Admitting: Physician Assistant

## 2023-04-12 ENCOUNTER — Encounter: Payer: Self-pay | Admitting: Physician Assistant

## 2023-04-12 VITALS — BP 134/86 | HR 67 | Temp 98.1°F | Ht 65.0 in | Wt 218.6 lb

## 2023-04-12 DIAGNOSIS — R509 Fever, unspecified: Secondary | ICD-10-CM | POA: Diagnosis not present

## 2023-04-12 DIAGNOSIS — R8271 Bacteriuria: Secondary | ICD-10-CM

## 2023-04-12 LAB — POCT URINALYSIS DIP (MANUAL ENTRY)
Blood, UA: NEGATIVE
Glucose, UA: NEGATIVE mg/dL
Nitrite, UA: NEGATIVE
Protein Ur, POC: NEGATIVE mg/dL
Spec Grav, UA: 1.02 (ref 1.010–1.025)
Urobilinogen, UA: 0.2 U/dL
pH, UA: 6 (ref 5.0–8.0)

## 2023-04-12 LAB — POCT INFLUENZA A/B
Influenza A, POC: NEGATIVE
Influenza B, POC: NEGATIVE

## 2023-04-12 LAB — POC COVID19 BINAXNOW: SARS Coronavirus 2 Ag: NEGATIVE

## 2023-04-12 NOTE — Progress Notes (Signed)
 Established patient visit   Patient: Brenda Russo   DOB: 09-07-1955   68 y.o. Female  MRN: [id number] Visit Date: 04/12/2023  Today's healthcare provider: Alfredia Ferguson, PA-C   Chief Complaint  Patient presents with   Fatigue    100.1 low grade fever onset 04/12/2022 Onset 04/10/2023   Subjective    Pt reports fatigue, and a fever at home since yesterday-- 100.2F yesterday, 100.1 F today.  Denies URI symptoms, denies nasal congestion, cough, headache, ear pain.   Not taking anything otc. Denies urinary frequency, maybe some abdominal pain.  She reports recent dental surgery and being on abx for the past few weeks. Denies current dental pain-- and she saw dentist yesterday who cleared her.  Medications: Outpatient Medications Prior to Visit  Medication Sig   albuterol (VENTOLIN HFA) 108 (90 Base) MCG/ACT inhaler Inhale 2 puffs by mouth into the lungs every 4 (four) hours as needed for wheezing or shortness of breath.   amLODipine (NORVASC) 5 MG tablet TAKE 1 TABLET BY MOUTH DAILY   chlorhexidine (PERIDEX) 0.12 % solution Place 15 milliliters in the mouth by mucous membrane route 2 times per day (after meals), swish in mouth for 30 seconds then spit out   clindamycin (CLEOCIN) 300 MG capsule Take 1 capsule (300 mg total) by mouth 3 (three) times daily.   Coenzyme Q10 (COQ10) 100 MG CAPS Take 1 capsule by mouth daily.   Ergocalciferol 2500 units CAPS Take 5,000 Units by mouth daily.   flecainide (TAMBOCOR) 50 MG tablet Take 1 tablet (50 mg total) by mouth 2 (two) times daily.   fluticasone (FLONASE) 50 MCG/ACT nasal spray Place 2 sprays into the nose daily as needed.   fluticasone furoate-vilanterol (BREO ELLIPTA) 100-25 MCG/ACT AEPB Inhale 1 puff into the lungs every morning.   HYDROcodone-acetaminophen (NORCO/VICODIN) 5-325 MG tablet Take 1 tablet by mouth every 4-6 hours as needed for pain.   losartan (COZAAR) 100 MG tablet TAKE 1 TABLET BY MOUTH DAILY   Magnesium  200 MG TABS Take by mouth.   meloxicam (MOBIC) 15 MG tablet Take 1 tablet (15 mg total) by mouth daily.   Multiple Minerals-Vitamins (CALCIUM CITRATE PLUS/MAGNESIUM) TABS Take 1 tablet by mouth.   Omega-3 Fatty Acids (FISH OIL) 1000 MG CAPS Take 1 capsule by mouth.   spironolactone (ALDACTONE) 25 MG tablet Take 1 tablet (25 mg total) by mouth daily.   triazolam (HALCION) 0.25 MG tablet Take 2 tablets (0.5 mg total) by mouth 30 minutes prior to surgery.   chlorhexidine (PERIDEX) 0.12 % solution Swish 15ml in mouth for 30 seconds 2 times per day (after meals), then spit out (Patient not taking: Reported on 01/10/2023)   triamcinolone lotion (KENALOG) 0.1 % Apply 1 Application topically 3 (three) times daily.   No facility-administered medications prior to visit.    Review of Systems  Constitutional:  Positive for fever. Negative for fatigue.  Respiratory:  Negative for cough and shortness of breath.   Cardiovascular:  Negative for chest pain and leg swelling.  Gastrointestinal:  Negative for abdominal pain.  Neurological:  Negative for dizziness and headaches.       Objective    BP 134/86 (BP Location: Left Arm, Patient Position: Sitting, Cuff Size: Large)   Pulse 67   Temp 98.1 F (36.7 C) (Oral)   Ht 5\' 5"  (1.651 m)   Wt 218 lb 9.6 oz (99.2 kg)   SpO2 97%   BMI 36.38 kg/m    Physical  Exam Constitutional:      General: She is awake.     Appearance: She is well-developed.  HENT:     Head: Normocephalic.     Right Ear: Tympanic membrane normal.     Left Ear: Tympanic membrane normal.     Mouth/Throat:     Pharynx: Posterior oropharyngeal erythema present. No oropharyngeal exudate.  Eyes:     Conjunctiva/sclera: Conjunctivae normal.  Cardiovascular:     Rate and Rhythm: Normal rate and regular rhythm.     Heart sounds: Normal heart sounds.  Pulmonary:     Effort: Pulmonary effort is normal.     Breath sounds: Normal breath sounds.  Skin:    General: Skin is warm.   Neurological:     Mental Status: She is alert and oriented to person, place, and time.  Psychiatric:        Attention and Perception: Attention normal.        Mood and Affect: Mood normal.        Speech: Speech normal.        Behavior: Behavior is cooperative.      Results for orders placed or performed in visit on 04/12/23  POCT urinalysis dipstick  Result Value Ref Range   Color, UA yellow yellow   Clarity, UA clear clear   Glucose, UA negative negative mg/dL   Bilirubin, UA small (A) negative   Ketones, POC UA small (15) (A) negative mg/dL   Spec Grav, UA 1.610 9.604 - 1.025   Blood, UA negative negative   pH, UA 6.0 5.0 - 8.0   Protein Ur, POC negative negative mg/dL   Urobilinogen, UA 0.2 0.2 or 1.0 E.U./dL   Nitrite, UA Negative Negative   Leukocytes, UA Small (1+) (A) Negative  POCT Influenza A/B  Result Value Ref Range   Influenza A, POC Negative Negative   Influenza B, POC Negative Negative  POC COVID-19  Result Value Ref Range   SARS Coronavirus 2 Ag Negative Negative    Assessment & Plan    Fever, unknown origin -     CBC with Differential/Platelet -     POCT urinalysis dipstick -     POCT Influenza A/B -     POC COVID-19 BinaxNow  Bacteria in urine -     Urine Culture   Poc covid, flu negative. UA w/ small leuks, ordering culture.   Will get cbc. Reassured today no signs of infection and she is afebrile in office.  Return if symptoms worsen or fail to improve.       Alfredia Ferguson, PA-C  [facility] at [facility] [phone] (phone) [phone] (fax)  [facility]

## 2023-04-13 ENCOUNTER — Encounter: Payer: Self-pay | Admitting: Physician Assistant

## 2023-04-13 LAB — CBC WITH DIFFERENTIAL/PLATELET
Basophils Absolute: 0.1 10*3/uL (ref 0.0–0.1)
Basophils Relative: 0.9 % (ref 0.0–3.0)
Eosinophils Absolute: 0.1 10*3/uL (ref 0.0–0.7)
Eosinophils Relative: 1.4 % (ref 0.0–5.0)
HCT: 42.1 % (ref 36.0–46.0)
Hemoglobin: 13.9 g/dL (ref 12.0–15.0)
Lymphocytes Relative: 35 % (ref 12.0–46.0)
Lymphs Abs: 2.2 10*3/uL (ref 0.7–4.0)
MCHC: 33 g/dL (ref 30.0–36.0)
MCV: 87.5 fl (ref 78.0–100.0)
Monocytes Absolute: 0.7 10*3/uL (ref 0.1–1.0)
Monocytes Relative: 11.3 % (ref 3.0–12.0)
Neutro Abs: 3.2 10*3/uL (ref 1.4–7.7)
Neutrophils Relative %: 51.4 % (ref 43.0–77.0)
Platelets: 298 10*3/uL (ref 150.0–400.0)
RBC: 4.81 Mil/uL (ref 3.87–5.11)
RDW: 13.9 % (ref 11.5–15.5)
WBC: 6.3 10*3/uL (ref 4.0–10.5)

## 2023-04-13 LAB — URINE CULTURE
MICRO NUMBER:: 16162327
SPECIMEN QUALITY:: ADEQUATE

## 2023-04-21 ENCOUNTER — Other Ambulatory Visit (HOSPITAL_BASED_OUTPATIENT_CLINIC_OR_DEPARTMENT_OTHER): Payer: Self-pay

## 2023-04-21 ENCOUNTER — Ambulatory Visit: Admitting: Physician Assistant

## 2023-04-21 ENCOUNTER — Encounter: Payer: Self-pay | Admitting: Physician Assistant

## 2023-04-21 VITALS — BP 121/76 | HR 80 | Temp 98.2°F | Resp 16 | Ht 65.0 in | Wt 214.0 lb

## 2023-04-21 DIAGNOSIS — R051 Acute cough: Secondary | ICD-10-CM | POA: Diagnosis not present

## 2023-04-21 DIAGNOSIS — J069 Acute upper respiratory infection, unspecified: Secondary | ICD-10-CM | POA: Diagnosis not present

## 2023-04-21 LAB — POCT INFLUENZA A/B
Influenza A, POC: NEGATIVE
Influenza B, POC: NEGATIVE

## 2023-04-21 LAB — POC COVID19 BINAXNOW: SARS Coronavirus 2 Ag: NEGATIVE

## 2023-04-21 MED ORDER — BENZONATATE 100 MG PO CAPS
100.0000 mg | ORAL_CAPSULE | Freq: Two times a day (BID) | ORAL | 0 refills | Status: DC | PRN
  Filled 2023-04-21: qty 20, 10d supply, fill #0

## 2023-04-21 NOTE — Progress Notes (Signed)
 Established patient visit   Patient: Brenda Russo   DOB: [birth year]   68 y.o. Female  MRN: [id number] Visit Date: 04/21/2023  Today's healthcare provider: Alfredia Ferguson, PA-C   Cc. cough  Subjective     Pt was seen 3/5 for a fever of unknown origin, reported temps or 100.61F max at home, with no other symptoms. POC URI testing negative, urine culture negative.  Today, pt report sore throat, cough,  nasal congestion. Reports temps now only 71F.   Medications: Outpatient Medications Prior to Visit  Medication Sig   albuterol (VENTOLIN HFA) 108 (90 Base) MCG/ACT inhaler Inhale 2 puffs by mouth into the lungs every 4 (four) hours as needed for wheezing or shortness of breath.   amLODipine (NORVASC) 5 MG tablet TAKE 1 TABLET BY MOUTH DAILY   chlorhexidine (PERIDEX) 0.12 % solution Place 15 milliliters in the mouth by mucous membrane route 2 times per day (after meals), swish in mouth for 30 seconds then spit out   Coenzyme Q10 (COQ10) 100 MG CAPS Take 1 capsule by mouth daily.   Ergocalciferol 2500 units CAPS Take 5,000 Units by mouth daily.   flecainide (TAMBOCOR) 50 MG tablet Take 1 tablet (50 mg total) by mouth 2 (two) times daily.   fluticasone (FLONASE) 50 MCG/ACT nasal spray Place 2 sprays into the nose daily as needed.   losartan (COZAAR) 100 MG tablet TAKE 1 TABLET BY MOUTH DAILY   Magnesium 200 MG TABS Take by mouth.   meloxicam (MOBIC) 15 MG tablet Take 1 tablet (15 mg total) by mouth daily.   Multiple Minerals-Vitamins (CALCIUM CITRATE PLUS/MAGNESIUM) TABS Take 1 tablet by mouth.   Omega-3 Fatty Acids (FISH OIL) 1000 MG CAPS Take 1 capsule by mouth.   spironolactone (ALDACTONE) 25 MG tablet Take 1 tablet (25 mg total) by mouth daily.   triazolam (HALCION) 0.25 MG tablet Take 2 tablets (0.5 mg total) by mouth 30 minutes prior to surgery.   [DISCONTINUED] chlorhexidine (PERIDEX) 0.12 % solution Swish 15ml in mouth for 30 seconds 2 times per day (after meals), then  spit out (Patient not taking: Reported on 01/10/2023)   [DISCONTINUED] clindamycin (CLEOCIN) 300 MG capsule Take 1 capsule (300 mg total) by mouth 3 (three) times daily.   [DISCONTINUED] fluticasone furoate-vilanterol (BREO ELLIPTA) 100-25 MCG/ACT AEPB Inhale 1 puff into the lungs every morning.   [DISCONTINUED] HYDROcodone-acetaminophen (NORCO/VICODIN) 5-325 MG tablet Take 1 tablet by mouth every 4-6 hours as needed for pain.   [DISCONTINUED] triamcinolone lotion (KENALOG) 0.1 % Apply 1 Application topically 3 (three) times daily.   No facility-administered medications prior to visit.    Review of Systems  Constitutional:  Positive for fatigue. Negative for fever.  HENT:  Positive for congestion, postnasal drip, rhinorrhea and sore throat.   Respiratory:  Positive for cough. Negative for shortness of breath.   Cardiovascular:  Negative for chest pain and leg swelling.  Gastrointestinal:  Negative for abdominal pain.  Neurological:  Negative for dizziness and headaches.       Objective    BP 121/76 (BP Location: Left Arm, Patient Position: Sitting, Cuff Size: Large)   Pulse 80   Temp 98.2 F (36.8 C) (Oral)   Resp 16   Ht 5\' 5"  (1.651 m)   Wt 214 lb (97.1 kg)   SpO2 97%   BMI 35.61 kg/m    Physical Exam Constitutional:      General: She is awake.     Appearance: She is well-developed.  HENT:     Head: Normocephalic.     Right Ear: Tympanic membrane normal.     Left Ear: Tympanic membrane normal.     Mouth/Throat:     Pharynx: Posterior oropharyngeal erythema present. No oropharyngeal exudate.  Eyes:     Conjunctiva/sclera: Conjunctivae normal.  Cardiovascular:     Rate and Rhythm: Normal rate and regular rhythm.     Heart sounds: Normal heart sounds.  Pulmonary:     Effort: Pulmonary effort is normal.     Breath sounds: Normal breath sounds. No wheezing, rhonchi or rales.  Skin:    General: Skin is warm.  Neurological:     Mental Status: She is alert and oriented  to person, place, and time.  Psychiatric:        Attention and Perception: Attention normal.        Mood and Affect: Mood normal.        Speech: Speech normal.        Behavior: Behavior is cooperative.     No results found for any visits on 04/21/23.  Assessment & Plan    Upper respiratory tract infection, unspecified type  Acute cough -     POC COVID-19 BinaxNow -     POCT Influenza A/B -     Benzonatate; Take 1 capsule (100 mg total) by mouth 2 (two) times daily as needed for cough.  Dispense: 20 capsule; Refill: 0    Re-testing today since she has developed symptoms, negative. Recommend rest, hydration, otc antihistamines, rx tessalon  Return if symptoms worsen or fail to improve.       Alfredia Ferguson, PA-C  [facility] at [facility] [phone] (phone) [phone] (fax)  [facility]

## 2023-04-24 ENCOUNTER — Ambulatory Visit: Payer: Self-pay | Admitting: Internal Medicine

## 2023-04-24 ENCOUNTER — Encounter: Payer: Self-pay | Admitting: Family Medicine

## 2023-04-24 ENCOUNTER — Other Ambulatory Visit (HOSPITAL_COMMUNITY): Payer: Self-pay

## 2023-04-24 ENCOUNTER — Ambulatory Visit (INDEPENDENT_AMBULATORY_CARE_PROVIDER_SITE_OTHER): Admitting: Family Medicine

## 2023-04-24 VITALS — BP 120/64 | HR 70 | Temp 99.6°F | Ht 65.0 in | Wt 215.5 lb

## 2023-04-24 DIAGNOSIS — J4541 Moderate persistent asthma with (acute) exacerbation: Secondary | ICD-10-CM

## 2023-04-24 DIAGNOSIS — J208 Acute bronchitis due to other specified organisms: Secondary | ICD-10-CM

## 2023-04-24 MED ORDER — HYDROCODONE BIT-HOMATROP MBR 5-1.5 MG/5ML PO SOLN
5.0000 mL | Freq: Every evening | ORAL | 0 refills | Status: DC | PRN
  Filled 2023-04-24: qty 120, 24d supply, fill #0

## 2023-04-24 MED ORDER — GUAIFENESIN-CODEINE 100-10 MG/5ML PO SOLN
5.0000 mL | Freq: Four times a day (QID) | ORAL | 0 refills | Status: DC | PRN
  Filled 2023-04-24: qty 100, 5d supply, fill #0

## 2023-04-24 MED ORDER — METHYLPREDNISOLONE 4 MG PO TBPK
ORAL_TABLET | ORAL | 0 refills | Status: DC
  Filled 2023-04-24: qty 21, 6d supply, fill #0

## 2023-04-24 MED ORDER — DOXYCYCLINE HYCLATE 100 MG PO TABS
100.0000 mg | ORAL_TABLET | Freq: Two times a day (BID) | ORAL | 0 refills | Status: DC
  Filled 2023-04-24: qty 20, 10d supply, fill #0

## 2023-04-24 NOTE — Telephone Encounter (Signed)
 Copied from CRM [id number]. Topic: Clinical - Red Word Triage >> Apr 24, 2023  7:32 AM Brenda Russo wrote: Red Word that prompted transfer to Nurse Triage:   Has had cough since Tues of last week Started with sore throat and then congestion Illness moved into chest.  Evaluated several times.  Blood in phlegm since yesterday  Chief Complaint: coughing up blood  Symptoms: coughing up blood Frequency: since yesterday Pertinent Negatives: Patient denies cp, sob Disposition: [] ED /[] Urgent Care (no appt availability in office) / [x] Appointment(In office/virtual)/ []  [facility]/ [] Home Care/ [] Refused Recommended Disposition /[] [facility]/ []  Follow-up with PCP Additional Notes: per protocol patient to be seen today; apt made and pcp office aware.  Care advice given, denies questions; instructed to go to ER if becomes worse.   Reason for Disposition  [1] MILD difficulty breathing (e.g., minimal/no SOB at rest, SOB with walking, pulse <100) AND [2] still present when not coughing  (Exception: No change from usual, chronic shortness of breath.)  Answer Assessment - Initial Assessment Questions 1. ONSET: "When did the cough begin?"      ON Tuesday 2. SEVERITY: "How bad is the cough today?" "Did the blood appear after a coughing spell?"      When she is bring up phlegm  3. SPUTUM: "Describe the color of your sputum" (none, dry cough; clear, white, yellow, green)     Bright red 4. HEMOPTYSIS: "How much blood?" (flecks, streaks, tablespoons, etc.)     streaks 5. DIFFICULTY BREATHING: "Are you having difficulty breathing?" If Yes, ask: "How bad is it?" (e.g., mild, moderate, severe)    - MILD: No SOB at rest, mild SOB with walking, speaks normally in sentences, can lie down, no retractions, pulse < 100.    - MODERATE: SOB at rest, SOB with minimal exertion and prefers to sit, cannot lie down flat, speaks in phrases, mild retractions, audible wheezing, pulse 100-120.    - SEVERE:  Very SOB at rest, speaks in single words, struggling to breathe, sitting hunched forward, retractions, pulse > 120      "Just a little sob" 6. FEVER: "Do you have a fever?" If Yes, ask: "What is your temperature, how was it measured, and when did it start?"     Low grade 7. CARDIAC HISTORY: "Do you have any history of heart disease?" (e.g., heart attack, congestive heart failure)      denies 8. LUNG HISTORY: "Do you have any history of lung disease?"  (e.g., pulmonary embolus, asthma, emphysema)     asthma 9. PE RISK FACTORS: "Do you have a history of blood clots?" (or: recent major surgery, recent prolonged travel, bedridden)     na 10. OTHER SYMPTOMS: "Do you have any other symptoms?" (e.g., runny nose, wheezing, chest pain)       Runny nose. Denies cp, just pressure 11. PREGNANCY: "Is there any chance you are pregnant?" "When was your last menstrual period?"       NA 12. TRAVEL: "Have you traveled out of the country in the last month?" (e.g., travel history, exposures)       denies  Protocols used: Coughing Up Blood-A-AH

## 2023-04-24 NOTE — Progress Notes (Signed)
 Brenda Gangemi T. Brenda Wool, MD, CAQ Sports Medicine [facility] [address] [place], [address]  Phone: [phone]  FAX: [phone]  Brenda Russo - 68 y.o. female  MRN [id number]  Date of Birth: 02/02/1956  Date: 04/24/2023  PCP: Wanda Plump, MD  Referral: Wanda Plump, MD  Chief Complaint  Patient presents with   Cough    Seen by [facility] 3/5 and 3/14 Coughed up blood tinged phlegm this morning   Subjective:   Brenda Russo is a 68 y.o. very pleasant female patient with Body mass index is 35.86 kg/m. who presents with the following:  She is a very pleasant patient of Dr. Drue Novel, and she presents today with some persistent coughing and acute illness lasting greater than 2 weeks.  Initially, her symptoms started more as a sore throat and progressed into nasal congestion, and then has further progressed into more of a pulmonary issue and complaints.  She does has asthma baseline and she is a patient of Dr. Sherene Sires.  She does take Breo once a day.  She really has been coughing quite profusely, and this morning she had some blood-tinged with her sputum.  She has had a low-grade fever and she has also had a lot of nasal and upper airway congestion.  She does not have any kind of significant headache, polyarthralgia or myalgia.  She has been tested for both COVID-19 and influenza, which were negative.  Today still coughing  Saw some blood this morning early in the morning Low grade fever and congested a lot.  Started last Tuesday and went to the nose and now down to the chest.   Non-smoker Asthma Uses Breo once a day    Review of Systems is noted in the HPI, as appropriate  Objective:   BP 120/64 (BP Location: Right Arm, Patient Position: Sitting)   Pulse 70   Temp 99.6 F (37.6 C) (Temporal)   Ht 5\' 5"  (1.651 m)   Wt 215 lb 8 oz (97.8 kg)   SpO2 97%   BMI 35.86 kg/m    Gen: WDWN, NAD. Globally Non-toxic HEENT: Throat  clear, w/o exudate, R TM clear, L TM - good landmarks, No fluid present. rhinnorhea.  MMM Frontal sinuses: NT Max sinuses: NT NECK: Anterior cervical  LAD is absent CV: RRR, No M/G/R, cap refill <2 sec PULM: Breathing comfortably in no respiratory distress. no wheezing, crackles.  She does have some scattered rhonchi at the lung bases  Laboratory and Imaging Data:  Assessment and Plan:     ICD-10-CM   1. Acute bronchitis due to other specified organisms  J20.8     2. Allergic asthma, moderate persistent, with acute exacerbation  J45.41      Persistent respiratory infection greater than 2 weeks in the setting of mild asthma exacerbation.  I am going to cover for atypicals and use some doxycycline.  Thank you would also be prudent put her on some steroids.  She did request a different steroid rather than prednisone, so gave her Medrol Dosepak.  She also is having a lot of coughing at night, so I sent her in some Hycodan.  Medication Management during today's office visit: Meds ordered this encounter  Medications   doxycycline (VIBRA-TABS) 100 MG tablet    Sig: Take 1 tablet (100 mg total) by mouth 2 (two) times daily.    Dispense:  20 tablet    Refill:  0   methylPREDNISolone (MEDROL DOSEPAK)  4 MG TBPK tablet    Sig: Follow directions on Dose Pak: Day 1: 2 tablets before breakfast, 1 tablet after lunch and after supper, 2 tablets at bedtime Day 2: 1 tablet before breakfast, 1 tablet after lunch and after supper, 2 tablets at bedtime Day 3: 1 tablet before breakfast and 1 tablet after lunch, after supper, and at bedtime Day 4: 1 tablet before breakfast, after lunch, and at bedtime Day 5: 1 tablet before breakfast and at bedtime Day 6: 1 tablet before breakfast.    Dispense:  21 tablet    Refill:  0    Dispense 1 Methylprednisolone Dose Pak   DISCONTD: guaiFENesin-codeine 100-10 MG/5ML syrup    Sig: Take 5 mLs by mouth every 6 (six) hours as needed for cough.    Dispense:  100 mL     Refill:  0   HYDROcodone bit-homatropine (HYCODAN) 5-1.5 MG/5ML syrup    Sig: Take 5 mLs by mouth at bedtime as needed for cough.    Dispense:  120 mL    Refill:  0   Medications Discontinued During This Encounter  Medication Reason   meloxicam (MOBIC) 15 MG tablet Completed Course   triazolam (HALCION) 0.25 MG tablet Completed Course   guaiFENesin-codeine 100-10 MG/5ML syrup     Orders placed today for conditions managed today: No orders of the defined types were placed in this encounter.   Disposition: No follow-ups on file.  Dr[employer] One speech-to-text software was used for transcription in this dictation.  Possible transcriptional errors can occur using Animal nutritionist.   Signed,  Elpidio Galea. Han Lysne, MD   Outpatient Encounter Medications as of 04/24/2023  Medication Sig   albuterol (VENTOLIN HFA) 108 (90 Base) MCG/ACT inhaler Inhale 2 puffs by mouth into the lungs every 4 (four) hours as needed for wheezing or shortness of breath.   amLODipine (NORVASC) 5 MG tablet TAKE 1 TABLET BY MOUTH DAILY   benzonatate (TESSALON) 100 MG capsule Take 1 capsule (100 mg total) by mouth 2 (two) times daily as needed for cough.   chlorhexidine (PERIDEX) 0.12 % solution Place 15 milliliters in the mouth by mucous membrane route 2 times per day (after meals), swish in mouth for 30 seconds then spit out   Coenzyme Q10 (COQ10) 100 MG CAPS Take 1 capsule by mouth daily.   doxycycline (VIBRA-TABS) 100 MG tablet Take 1 tablet (100 mg total) by mouth 2 (two) times daily.   Ergocalciferol 2500 units CAPS Take 5,000 Units by mouth daily.   flecainide (TAMBOCOR) 50 MG tablet Take 1 tablet (50 mg total) by mouth 2 (two) times daily.   fluticasone (FLONASE) 50 MCG/ACT nasal spray Place 2 sprays into the nose daily as needed.   HYDROcodone bit-homatropine (HYCODAN) 5-1.5 MG/5ML syrup Take 5 mLs by mouth at bedtime as needed for cough.   losartan (COZAAR) 100 MG tablet TAKE 1 TABLET BY MOUTH DAILY    Magnesium 200 MG TABS Take by mouth.   methylPREDNISolone (MEDROL DOSEPAK) 4 MG TBPK tablet Follow directions on Dose Pak: Day 1: 2 tablets before breakfast, 1 tablet after lunch and after supper, 2 tablets at bedtime Day 2: 1 tablet before breakfast, 1 tablet after lunch and after supper, 2 tablets at bedtime Day 3: 1 tablet before breakfast and 1 tablet after lunch, after supper, and at bedtime Day 4: 1 tablet before breakfast, after lunch, and at bedtime Day 5: 1 tablet before breakfast and at bedtime Day 6: 1 tablet before breakfast.  Multiple Minerals-Vitamins (CALCIUM CITRATE PLUS/MAGNESIUM) TABS Take 1 tablet by mouth.   Omega-3 Fatty Acids (FISH OIL) 1000 MG CAPS Take 1 capsule by mouth.   spironolactone (ALDACTONE) 25 MG tablet Take 1 tablet (25 mg total) by mouth daily.   [DISCONTINUED] guaiFENesin-codeine 100-10 MG/5ML syrup Take 5 mLs by mouth every 6 (six) hours as needed for cough.   [DISCONTINUED] meloxicam (MOBIC) 15 MG tablet Take 1 tablet (15 mg total) by mouth daily.   [DISCONTINUED] triazolam (HALCION) 0.25 MG tablet Take 2 tablets (0.5 mg total) by mouth 30 minutes prior to surgery.   No facility-administered encounter medications on file as of 04/24/2023.

## 2023-05-01 ENCOUNTER — Telehealth: Payer: Self-pay

## 2023-05-01 NOTE — Telephone Encounter (Signed)
 Left voicemail about patient needing to reschedule due to Dr. Erin Fulling being out of office that date.

## 2023-05-03 ENCOUNTER — Ambulatory Visit: Admitting: Obstetrics & Gynecology

## 2023-06-06 ENCOUNTER — Other Ambulatory Visit: Payer: Self-pay | Admitting: Internal Medicine

## 2023-06-07 ENCOUNTER — Ambulatory Visit: Admitting: Obstetrics & Gynecology

## 2023-06-21 ENCOUNTER — Telehealth (HOSPITAL_BASED_OUTPATIENT_CLINIC_OR_DEPARTMENT_OTHER): Payer: Self-pay

## 2023-06-21 ENCOUNTER — Other Ambulatory Visit (HOSPITAL_COMMUNITY)
Admission: RE | Admit: 2023-06-21 | Discharge: 2023-06-21 | Disposition: A | Discharge status: Home / Self Care | Source: Ambulatory Visit | Attending: Obstetrics & Gynecology | Admitting: Obstetrics & Gynecology

## 2023-06-21 ENCOUNTER — Ambulatory Visit: Admitting: Obstetrics & Gynecology

## 2023-06-21 ENCOUNTER — Encounter: Payer: Self-pay | Admitting: Obstetrics & Gynecology

## 2023-06-21 VITALS — BP 126/66 | HR 74 | Ht 65.25 in | Wt 223.0 lb

## 2023-06-21 DIAGNOSIS — M8580 Other specified disorders of bone density and structure, unspecified site: Secondary | ICD-10-CM

## 2023-06-21 DIAGNOSIS — Z01419 Encounter for gynecological examination (general) (routine) without abnormal findings: Secondary | ICD-10-CM | POA: Diagnosis not present

## 2023-06-21 DIAGNOSIS — Z1231 Encounter for screening mammogram for malignant neoplasm of breast: Secondary | ICD-10-CM

## 2023-06-21 DIAGNOSIS — Z1331 Encounter for screening for depression: Secondary | ICD-10-CM | POA: Diagnosis not present

## 2023-06-21 NOTE — Progress Notes (Signed)
 Subjective:     Brenda Russo is a 68 y.o. female here for a routine exam.  Current complaints: no GYN complaints. Occ leakage of urine if she waits to long to void. Not a problem for her. Had back pain and was in PT. Impored. Sexually active with no problems.      Gynecologic History No LMP recorded. Patient is postmenopausal. Contraception: post menopausal status Last Pap: 01/27/2020. Results were: normal Last mammogram: 11/08/2022. Results were: normal  Obstetric History OB History  Gravida Para Term Preterm AB Living  3 3 3   3   SAB IAB Ectopic Multiple Live Births      3    # Outcome Date GA Lbr Len/2nd Weight Sex Type Anes PTL Lv  3 Term 1980 [redacted]w[redacted]d   F CS-LTranv Spinal N LIV  2 Term 1978 [redacted]w[redacted]d   F CS-LTranv EPI N LIV  1 Term 24 [redacted]w[redacted]d   M Vag-Spont None N LIV     The following portions of the patient's history were reviewed and updated as appropriate: allergies, current medications, past family history, past medical history, past social history, past surgical history, and problem list.  Review of Systems Pertinent items are noted in HPI.    Objective:  BP 126/66   Pulse 74   Ht 5' 5.25" (1.657 m)   Wt 223 lb (101.2 kg)   BMI 36.83 kg/m  General Appearance:    Alert, cooperative, no distress, appears stated age  Head:    Normocephalic, without obvious abnormality, atraumatic  Eyes:    conjunctiva/corneas clear, EOM's intact, both eyes  Ears:    Normal external ear canals, both ears  Nose:   Nares normal, septum midline, mucosa normal, no drainage    or sinus tenderness  Throat:   Lips, mucosa, and tongue normal; teeth and gums normal  Neck:   Supple, symmetrical, trachea midline, no adenopathy;    thyroid :  no enlargement/tenderness/nodules  Back:     Symmetric, no curvature, ROM normal, no CVA tenderness  Lungs:     respirations unlabored  Chest Wall:    No tenderness or deformity   Heart:    Regular rate and rhythm  Breast Exam:    No tenderness, masses, or  nipple abnormality  Abdomen:     Soft, non-tender, bowel sounds active all four quadrants,    no masses, no organomegaly  Genitalia:    Normal female without lesion, discharge or tenderness     Extremities:   Extremities normal, atraumatic, no cyanosis or edema  Pulses:   2+ and symmetric all extremities  Skin:   Skin color, texture, turgor normal, no rashes or lesions     Assessment:    Healthy female exam.  H/o osteopenia 2022. Needs f/u DEXA. Pt reports taking Vit D and Ca++   Plan:  [name] was seen today for gynecologic exam.  Diagnoses and all orders for this visit:  Well female exam with routine gynecological exam -     Cancel: Cytology - PAP( [place])   F/u mammogram in Oct 2025.  F/u in 1 year for annual or sooner prn   Delsie Amador L. Harraway-Smith, M.D., FACOG

## 2023-06-26 ENCOUNTER — Other Ambulatory Visit: Payer: Self-pay | Admitting: Internal Medicine

## 2023-07-04 ENCOUNTER — Telehealth: Payer: Self-pay | Admitting: Internal Medicine

## 2023-07-04 NOTE — Telephone Encounter (Signed)
*  STAT* If patient is at the pharmacy, call can be transferred to refill team.   1. Which medications need to be refilled? (please list name of each medication and dose if known)   flecainide  (TAMBOCOR ) 50 MG tablet    spironolactone  (ALDACTONE ) 25 MG tablet   2. Which pharmacy/location (including street and city if local pharmacy) is medication to be sent to? [facility] - [place], [place] - [address] Phone: [phone]  Fax: [phone]     3. Do they need a 30 day or 90 day supply? 90

## 2023-07-05 ENCOUNTER — Ambulatory Visit: Payer: Self-pay | Admitting: Obstetrics & Gynecology

## 2023-07-05 LAB — CYTOLOGY - PAP
Comment: NEGATIVE
Diagnosis: NEGATIVE
High risk HPV: NEGATIVE

## 2023-07-05 MED ORDER — FLECAINIDE ACETATE 50 MG PO TABS: 50.0000 mg | ORAL_TABLET | Freq: Two times a day (BID) | ORAL | 1 refills | Status: DC

## 2023-07-05 MED ORDER — SPIRONOLACTONE 25 MG PO TABS: 25.0000 mg | ORAL_TABLET | Freq: Every day | ORAL | 1 refills | Status: DC

## 2023-07-05 NOTE — Telephone Encounter (Signed)
 Pt's medications were sent to pt's pharmacy as requested. Confirmation received.

## 2023-07-06 ENCOUNTER — Ambulatory Visit

## 2023-07-06 VITALS — BP 126/66 | Ht 65.0 in | Wt 218.0 lb

## 2023-07-06 DIAGNOSIS — Z0000 Encounter for general adult medical examination without abnormal findings: Secondary | ICD-10-CM

## 2023-07-06 NOTE — Progress Notes (Signed)
 Because this visit was a virtual/telehealth visit,  certain criteria was not obtained, such a blood pressure, CBG if applicable, and timed get up and go. Any medications not marked as "taking" were not mentioned during the medication reconciliation part of the visit. Any vitals not documented were not able to be obtained due to this being a telehealth visit or patient was unable to self-report a recent blood pressure reading due to a lack of equipment at home via telehealth. Vitals that have been documented are verbally provided by the patient.   This visit was performed by a medical professional under my direct supervision. I was immediately available for consultation/collaboration. I have reviewed and agree with the Annual Wellness Visit documentation.  Subjective:   Brenda Russo is a 68 y.o. who presents for a Medicare Wellness preventive visit.  As a reminder, Annual Wellness Visits don't include a physical exam, and some assessments may be limited, especially if this visit is performed virtually. We may recommend an in-person follow-up visit with your provider if needed.  Visit Complete: Virtual I connected with  Brenda Russo on 07/06/23 by a audio enabled telemedicine application and verified that I am speaking with the correct person using two identifiers.  Patient Location: Home  Provider Location: Home Office  I discussed the limitations of evaluation and management by telemedicine. The patient expressed understanding and agreed to proceed.  Vital Signs: Because this visit was a virtual/telehealth visit, some criteria may be missing or patient reported. Any vitals not documented were not able to be obtained and vitals that have been documented are patient reported.  VideoDeclined- This patient declined Librarian, academic. Therefore the visit was completed with audio only.  Persons Participating in Visit: Patient.  AWV Questionnaire: No: Patient  Medicare AWV questionnaire was not completed prior to this visit.  Cardiac Risk Factors include: advanced age (>74men, >65 women);hypertension     Objective:     Today's Vitals   07/06/23 1515  BP: 126/66  Weight: 218 lb (98.9 kg)  Height: 5\' 5"  (1.651 m)   Body mass index is 36.28 kg/m.     07/06/2023    3:14 PM 12/21/2022    2:27 PM 04/12/2016    3:17 PM 12/22/2015    3:20 PM 09/22/2015   11:19 AM 06/10/2015    9:45 AM 02/10/2015    9:59 AM  Advanced Directives  Does Patient Have a Medical Advance Directive? Yes Yes No No No No No  Type of Estate agent of [place];Living will Healthcare Power of [place];Living will       Copy of Healthcare Power of Attorney in Chart? No - copy requested        Would patient like information on creating a medical advance directive? No - Patient declined  No - Patient declined Yes - Transport planner given Yes - Transport planner given Yes - Transport planner given Yes - Transport planner given    Current Medications (verified) Outpatient Encounter Medications as of 07/06/2023  Medication Sig   albuterol  (VENTOLIN  HFA) 108 (90 Base) MCG/ACT inhaler Inhale 2 puffs by mouth into the lungs every 4 (four) hours as needed for wheezing or shortness of breath.   amLODipine  (NORVASC ) 5 MG tablet Take 1 tablet (5 mg total) by mouth daily.   Coenzyme Q10 (COQ10) 100 MG CAPS Take 1 capsule by mouth daily.   Ergocalciferol  2500 units CAPS Take 5,000 Units by mouth daily.   flecainide  (TAMBOCOR ) 50 MG tablet  Take 1 tablet (50 mg total) by mouth 2 (two) times daily.   fluticasone  (FLONASE ) 50 MCG/ACT nasal spray Place 2 sprays into the nose daily as needed.   fluticasone  furoate-vilanterol (BREO ELLIPTA ) 100-25 MCG/ACT AEPB USE 1 INHALATION BY MOUTH ONCE  DAILY AT THE SAME TIME EACH DAY  EVERY MORNING   losartan  (COZAAR ) 100 MG tablet Take 1 tablet (100 mg total) by mouth daily.   Magnesium 200 MG TABS Take by mouth.    Multiple Minerals-Vitamins (CALCIUM CITRATE PLUS/MAGNESIUM) TABS Take 1 tablet by mouth.   Omega-3 Fatty Acids (FISH OIL) 1000 MG CAPS Take 1 capsule by mouth.   spironolactone  (ALDACTONE ) 25 MG tablet Take 1 tablet (25 mg total) by mouth daily.   doxycycline  (VIBRA -TABS) 100 MG tablet Take 1 tablet (100 mg total) by mouth 2 (two) times daily. (Patient not taking: Reported on 07/06/2023)   No facility-administered encounter medications on file as of 07/06/2023.    Allergies (verified) Egg-derived products   History: Past Medical History:  Diagnosis Date   Allergic rhinitis    Allergy    Anemia    Anxiety    Asthma    HTN (hypertension)    Hyperlipidemia    borderline   Obese    OSA (obstructive sleep apnea)    mild ,no CPAP   Overweight(278.02)    Plantar wart    history   Prediabetes    Sleep apnea    Past Surgical History:  Procedure Laterality Date   BREAST BIOPSY Right 09/02/2015   CESAREAN SECTION     COLONOSCOPY  11/05/2018   COLONOSCOPY  2010   TUBAL LIGATION     Family History  Problem Relation Age of Onset   Heart disease Mother        pacemaker    Hypertension Mother    Hyperlipidemia Mother    Obesity Mother    Asthma Father    Pancreatic cancer Brother    Breast cancer Maternal Aunt    Colon cancer Neg Hx    Esophageal cancer Neg Hx    Stomach cancer Neg Hx    Rectal cancer Neg Hx    Social History   Socioeconomic History   Marital status: Married    Spouse name: Not on file   Number of children: 3   Years of education: Not on file   Highest education level: Associate degree: academic program  Occupational History   Occupation: Secondary school teacher at [employer]    Employer: [employer]    Comment: first shift  Tobacco Use   Smoking status: Never   Smokeless tobacco: Never  Substance and Sexual Activity   Alcohol use: No   Drug use: No   Sexual activity: Yes  Other Topics Concern   Not on file  Social History Narrative   3 biological  children   1 adopted child   3 step-children   Household-- pt , husband and a son   Social Drivers of Corporate investment banker Strain: Low Risk  (07/06/2023)   Overall Financial Resource Strain (CARDIA)    Difficulty of Paying Living Expenses: Not hard at all  Food Insecurity: No Food Insecurity (07/06/2023)   Hunger Vital Sign    Worried About Running Out of Food in the Last Year: Never true    Ran Out of Food in the Last Year: Never true  Transportation Needs: No Transportation Needs (07/06/2023)   PRAPARE - Administrator, Civil Service (Medical): No  Lack of Transportation (Non-Medical): No  Physical Activity: Insufficiently Active (07/06/2023)   Exercise Vital Sign    Days of Exercise per Week: 3 days    Minutes of Exercise per Session: 20 min  Stress: No Stress Concern Present (07/06/2023)   Brenda Russo of Occupational Health - Occupational Stress Questionnaire    Feeling of Stress : Not at all  Social Connections: Socially Integrated (07/06/2023)   Social Connection and Isolation Panel [NHANES]    Frequency of Communication with Friends and Family: More than three times a week    Frequency of Social Gatherings with Friends and Family: Twice a week    Attends Religious Services: More than 4 times per year    Active Member of [employer] or Organizations: Yes    Attends Engineer, structural: More than 4 times per year    Marital Status: Married    Tobacco Counseling Counseling given: Not Answered    Clinical Intake:  Pre-visit preparation completed: Yes  Pain : No/denies pain     BMI - recorded: 36.28 Nutritional Status: BMI > 30  Obese Nutritional Risks: None Diabetes: No  Lab Results  Component Value Date   HGBA1C 5.9 10/07/2022   HGBA1C 6.1 08/06/2021   HGBA1C 6.1 08/06/2020     How often do you need to have someone help you when you read instructions, pamphlets, or other written materials from your doctor or pharmacy?: 1 -  Never  Interpreter Needed?: No  Information entered by :: Reva Pinkley,cma   Activities of Daily Living     07/06/2023    3:21 PM  In your present state of health, do you have any difficulty performing the following activities:  Hearing? 0  Vision? 0  Difficulty concentrating or making decisions? 0  Walking or climbing stairs? 0  Dressing or bathing? 0  Doing errands, shopping? 0  Preparing Food and eating ? N  Using the Toilet? N  In the past six months, have you accidently leaked urine? N  Do you have problems with loss of bowel control? N  Managing your Medications? N  Managing your Finances? N  Housekeeping or managing your Housekeeping? N    Patient Care Team: Ezell Hollow, MD as PCP - General (Internal Medicine) Verona Goodwill, MD as PCP - Electrophysiology (Cardiology) Ivery Marking, MD as Consulting Physician (Obstetrics and Gynecology) Lorraine Roses, MD as Consulting Physician (Pulmonary Disease) Glenora Laos, MD as Consulting Physician [facility])  Indicate any recent Medical Services you may have received from other than [facility] providers in the past year (date may be approximate).     Assessment:    This is a routine wellness examination for Brenda Russo.  Hearing/Vision screen Hearing Screening - Comments:: No difficulties hearing Vision Screening - Comments:: Wears glasses   Goals Addressed               This Visit's Progress     Patient Stated (pt-stated)        To have better health       Depression Screen     07/06/2023    3:23 PM 06/21/2023    3:17 PM 01/10/2023    1:18 PM 11/14/2022   11:29 AM 10/07/2022   10:00 AM 06/07/2022   10:50 AM 08/06/2021    9:19 AM  PHQ 2/9 Scores  PHQ - 2 Score 0 0 0 0 0 0 0  PHQ- 9 Score 0 0    0     Fall Risk  07/06/2023    3:20 PM 01/10/2023    1:18 PM 11/14/2022   11:29 AM 10/07/2022   10:00 AM 06/07/2022   10:50 AM  Fall Risk   Falls in the past year? 0 0 0 0 0  Number falls in past  yr: 0 0 0 0 0  Injury with Fall? 0 0 0 0 0  Risk for fall due to : No Fall Risks    No Fall Risks  Follow up Falls evaluation completed Falls evaluation completed Falls evaluation completed Falls evaluation completed Falls evaluation completed    MEDICARE RISK AT HOME:  Medicare Risk at Home Any stairs in or around the home?: Yes If so, are there any without handrails?: No Home free of loose throw rugs in walkways, pet beds, electrical cords, etc?: Yes Adequate lighting in your home to reduce risk of falls?: Yes Life alert?: No Use of a cane, walker or w/c?: No Grab bars in the bathroom?: No Shower chair or bench in shower?: No Elevated toilet seat or a handicapped toilet?: Yes  TIMED UP AND GO:  Was the test performed?  No  Cognitive Function: 6CIT completed        07/06/2023    3:17 PM  6CIT Screen  What Year? 0 points  What month? 0 points  What time? 0 points  Count back from 20 0 points  Months in reverse 0 points  Repeat phrase 0 points  Total Score 0 points    Immunizations Immunization History  Administered Date(s) Administered   Influenza Inj Mdck Quad Pf 11/22/2021   Influenza Split 11/08/2019   Influenza-Unspecified 10/08/2013, 11/07/2017, 11/22/2018   Moderna Sars-Covid-2 Vaccination 09/10/2019, 10/18/2019    Screening Tests Health Maintenance  Topic Date Due   Medicare Annual Wellness (AWV)  07/05/2024   MAMMOGRAM  11/07/2024   Colonoscopy  11/04/2025   DEXA SCAN  Completed   Hepatitis C Screening  Completed   HPV VACCINES  Aged Out   Meningococcal B Vaccine  Aged Out   DTaP/Tdap/Td  Discontinued   Pneumonia Vaccine 21+ Years old  Discontinued   COVID-19 Vaccine  Discontinued   Zoster Vaccines- Shingrix  Discontinued    Health Maintenance  There are no preventive care reminders to display for this patient. Health Maintenance Items Addressed:   Additional Screening:  Vision Screening: Recommended annual ophthalmology exams for early  detection of glaucoma and other disorders of the eye.  Dental Screening: Recommended annual dental exams for proper oral hygiene  Community Resource Referral / Chronic Care Management: CRR required this visit?  No   CCM required this visit?  No   Plan:    I have personally reviewed and noted the following in the patient's chart:   Medical and social history Use of alcohol, tobacco or illicit drugs  Current medications and supplements including opioid prescriptions. Patient is not currently taking opioid prescriptions. Functional ability and status Nutritional status Physical activity Advanced directives List of other physicians Hospitalizations, surgeries, and ER visits in previous 12 months Vitals Screenings to include cognitive, depression, and falls Referrals and appointments  In addition, I have reviewed and discussed with patient certain preventive protocols, quality metrics, and best practice recommendations. A written personalized care plan for preventive services as well as general preventive health recommendations were provided to patient.   Freeda Jerry, [place]   07/06/2023   After Visit Summary: (MyChart) Due to this being a telephonic visit, the after visit summary with patients personalized plan was offered  to patient via MyChart   Notes: Nothing significant to report at this time.

## 2023-07-06 NOTE — Patient Instructions (Signed)
 Ms. Brenda Russo , Thank you for taking time out of your busy schedule to complete your Annual Wellness Visit with me. I enjoyed our conversation and look forward to speaking with you again next year. I, as well as your care team,  appreciate your ongoing commitment to your health goals. Please review the following plan we discussed and let me know if I can assist you in the future. Your Game plan/ To Do List    Referrals: If you haven't heard from the office you've been referred to, please reach out to them at the phone provided.   Follow up Visits: Next Medicare AWV with our clinical staff:07/11/2023 Have you seen your provider in the last 6 months (3 months if uncontrolled diabetes)? No Next Office Visit with your provider: 07/10/2024  Clinician Recommendations:  Aim for 30 minutes of exercise or brisk walking, 6-8 glasses of water, and 5 servings of fruits and vegetables each day.       This is a list of the screening recommended for you and due dates:  Health Maintenance  Topic Date Due   Medicare Annual Wellness Visit  07/05/2024   Mammogram  11/07/2024   Colon Cancer Screening  11/04/2025   DEXA scan (bone density measurement)  Completed   Hepatitis C Screening  Completed   HPV Vaccine  Aged Out   Meningitis B Vaccine  Aged Out   DTaP/Tdap/Td vaccine  Discontinued   Pneumonia Vaccine  Discontinued   COVID-19 Vaccine  Discontinued   Zoster (Shingles) Vaccine  Discontinued    Advanced directives: (Declined) Advance directive discussed with you today. Even though you declined this today, please call our office should you change your mind, and we can give you the proper paperwork for you to fill out. Advance Care Planning is important because it:  [x]  Makes sure you receive the medical care that is consistent with your values, goals, and preferences  [x]  It provides guidance to your family and loved ones and reduces their decisional burden about whether or not they are making the  right decisions based on your wishes.  Follow the link provided in your after visit summary or read over the paperwork we have mailed to you to help you started getting your Advance Directives in place. If you need assistance in completing these, please reach out to us  so that we can help you!  See attachments for Preventive Care and Fall Prevention Tips.

## 2023-07-16 NOTE — Progress Notes (Unsigned)
 Brenda Russo, female    DOB: 1955/02/12    MRN: [id number]   Brief patient profile:  16 yobf never smoker/ born prematurely but able to keep up with other kids work  but asthma as child and never outgrew prev under Dr [name] Condon care and maintained on BREO 200 one daily with dx of moderate chronic asthma referred herself to me to establish for pulmonary f/u for asthma    History of Present Illness  11/18/2019  Pulmonary/ 1st office eval/Christopher Hink unhappy with cost of breo 200  Chief Complaint  Patient presents with   Follow-up    Denies any problems with breathing  Dyspnea:  Not limited by breathing from desired activities  -no regular aerobics or steps Cough: none  Sleep: no problem at 10 degrees  SABA use: none wixella 250 one puff first thing in am then repeat in 12 hours   06/28/2021  f/u ov/Aristidis Talerico re: mod asthma  maint on wixella  250  Chief Complaint  Patient presents with   Follow-up    Breathing is about the same. She states some days she feels a pressure in her chest- depends on the pollen. She rarely uses her albuterol  inhaler.    Dyspnea:  short jog 500 ft to neighbor's house  Cough: not a problem Sleeping: ok 10 degrees  SABA use: rarely  02: none Rec No change in medications for now  Only use your albuterol  as a rescue medication  Also Ok to try albuterol  15 min before an activity (on alternating days)  that you know would usually make you short of breath    07/05/2022  f/u ov/Chanetta Moosman re:  asthma since childhood maint on  Breo 200   Chief Complaint  Patient presents with   Follow-up    Breo working well.  Runny nose with allergies.  Dyspnea:  playing pickle ball  Cough: none  Sleeping: 10 degrees elevation  SABA use: rarely  02: none  Rec Try BREO  100 one click each am (ok to use up the 200 if you can't stop shipment  Only use your albuterol  as a rescue medication    [facility]  [phone]       07/19/2023  f/u ov/Venetta Knee re: asthma since childhood  maint  on BREO 100  Chief Complaint  Patient presents with   Follow-up    Follow up asthma, having some issues with allergy, runny nose and watery take allergy medication otc for this.   Dyspnea:  Not limited by breathing from desired activities   Cough: assoc with pnds rx allergra works ok  Sleeping: electric hob up 10 degrees / one pillow s  resp cc  SABA use: not needing  02: none     No obvious day to day or daytime variability or assoc excess/ purulent sputum or mucus plugs or hemoptysis or cp or chest tightness, subjective wheeze or overt sinus or hb symptoms.    Also denies any obvious fluctuation of symptoms with weather or environmental changes or other aggravating or alleviating factors except as outlined above   No unusual exposure hx or h/o childhood pna knowledge of premature birth.  Current Allergies, Complete Past Medical History, Past Surgical History, Family History, and Social History were reviewed in [employer] record.  ROS  The following are not active complaints unless bolded Hoarseness, sore throat, dysphagia, dental problems, itching, sneezing,  nasal congestion or discharge of excess mucus or purulent secretions, ear ache,   fever, chills,  sweats, unintended wt loss or wt gain, classically pleuritic or exertional cp,  orthopnea pnd or arm/hand swelling  or leg swelling, presyncope, palpitations, abdominal pain, anorexia, nausea, vomiting, diarrhea  or change in bowel habits or change in bladder habits, change in stools or change in urine, dysuria, hematuria,  rash, arthralgias/back pain, visual complaints, headache, numbness, weakness or ataxia or problems with walking or coordination,  change in mood or  memory.        Current Meds  Medication Sig   albuterol  (VENTOLIN  HFA) 108 (90 Base) MCG/ACT inhaler Inhale 2 puffs by mouth into the lungs every 4 (four) hours as needed for wheezing or shortness of breath.   amLODipine  (NORVASC ) 5 MG tablet Take  1 tablet (5 mg total) by mouth daily.   Coenzyme Q10 (COQ10) 100 MG CAPS Take 1 capsule by mouth daily.   doxycycline  (VIBRA -TABS) 100 MG tablet Take 1 tablet (100 mg total) by mouth 2 (two) times daily.   Ergocalciferol  2500 units CAPS Take 5,000 Units by mouth daily.   flecainide  (TAMBOCOR ) 50 MG tablet Take 1 tablet (50 mg total) by mouth 2 (two) times daily.   fluticasone  (FLONASE ) 50 MCG/ACT nasal spray Place 2 sprays into the nose daily as needed.   fluticasone  furoate-vilanterol (BREO ELLIPTA ) 100-25 MCG/ACT AEPB USE 1 INHALATION BY MOUTH ONCE  DAILY AT THE SAME TIME EACH DAY  EVERY MORNING   losartan  (COZAAR ) 100 MG tablet Take 1 tablet (100 mg total) by mouth daily.   Magnesium 200 MG TABS Take by mouth.   Multiple Minerals-Vitamins (CALCIUM CITRATE PLUS/MAGNESIUM) TABS Take 1 tablet by mouth.   Omega-3 Fatty Acids (FISH OIL) 1000 MG CAPS Take 1 capsule by mouth.   spironolactone  (ALDACTONE ) 25 MG tablet Take 1 tablet (25 mg total) by mouth daily.             Past Medical History:  Diagnosis Date   Allergic rhinitis    Allergy    Anemia    Anxiety    Asthma    HTN (hypertension)    Hyperlipidemia    borderline   Obese    OSA (obstructive sleep apnea)    mild ,no CPAP   Overweight(278.02)    Plantar wart    history   Prediabetes    Sleep apnea         Objective:    Wts  07/19/2023       223  07/05/2022       218   06/28/2021      215   06/15/20 218 lb (98.9 kg)  04/07/20 216 lb (98 kg)  02/06/20 221 lb 6 oz (100.4 kg)    Vital signs reviewed  07/19/2023  - Note at rest 02 sats  98% on RA   General appearance:    amb pleasant bf nad/ lung clear    HEENT : Oropharynx  nl      Nasal turbinates nl    NECK :  without  apparent JVD/ palpable Nodes/TM    LUNGS: no acc muscle use,  Nl contour chest which is clear to A and P bilaterally without cough on insp or exp maneuvers   CV:  RRR  no s3   1-2/6  SEM   s increase in P2, and no edema   ABD:  soft and  nontender   MS:  Gait nl  ext warm without deformities Or obvious joint restrictions  calf tenderness, cyanosis or clubbing    SKIN: warm  and dry without lesions    NEURO:  alert, approp, nl sensorium with  no motor or cerebellar deficits apparent.       Assessment

## 2023-07-17 DIAGNOSIS — H43813 Vitreous degeneration, bilateral: Secondary | ICD-10-CM | POA: Diagnosis not present

## 2023-07-17 DIAGNOSIS — H04123 Dry eye syndrome of bilateral lacrimal glands: Secondary | ICD-10-CM | POA: Diagnosis not present

## 2023-07-17 DIAGNOSIS — H1045 Other chronic allergic conjunctivitis: Secondary | ICD-10-CM | POA: Diagnosis not present

## 2023-07-17 DIAGNOSIS — H25813 Combined forms of age-related cataract, bilateral: Secondary | ICD-10-CM | POA: Diagnosis not present

## 2023-07-19 ENCOUNTER — Encounter: Payer: Self-pay | Admitting: Internal Medicine

## 2023-07-19 ENCOUNTER — Ambulatory Visit: Admitting: Internal Medicine

## 2023-07-19 VITALS — BP 136/84 | HR 58 | Ht 65.0 in | Wt 223.6 lb

## 2023-07-19 DIAGNOSIS — J4540 Moderate persistent asthma, uncomplicated: Secondary | ICD-10-CM | POA: Diagnosis not present

## 2023-07-19 NOTE — Patient Instructions (Signed)
No change in medications   Please schedule a follow up visit in 12  months but call sooner if needed  

## 2023-07-19 NOTE — Assessment & Plan Note (Signed)
 Never smoker - Spirometry  12/24/15 FEV1 1.7 (76%)  Ratio 0.61 with classic concavity unknown whether p meds or not   - FENO  06/28/21   = 18 on wixella 250 bid > try BREO 100 daily   On BREO 100 each am : All goals of chronic asthma control met including optimal function and elimination of symptoms with minimal need for rescue therapy.   Contingencies discussed in full including contacting this office immediately if not controlling the symptoms using the rule of two's.     F/u can be yearly - call sooner prn

## 2023-09-12 ENCOUNTER — Other Ambulatory Visit: Payer: Self-pay | Admitting: Internal Medicine

## 2023-09-14 ENCOUNTER — Other Ambulatory Visit: Payer: Self-pay | Admitting: Internal Medicine

## 2023-09-19 ENCOUNTER — Other Ambulatory Visit: Payer: Self-pay | Admitting: Internal Medicine

## 2023-10-10 ENCOUNTER — Encounter: Payer: Self-pay | Admitting: Internal Medicine

## 2023-10-10 ENCOUNTER — Ambulatory Visit: Payer: Medicare Other | Admitting: Internal Medicine

## 2023-10-10 ENCOUNTER — Encounter: Payer: Medicare Other | Admitting: Internal Medicine

## 2023-10-10 VITALS — BP 126/80 | HR 62 | Temp 97.6°F | Resp 16 | Ht 65.0 in | Wt 222.0 lb

## 2023-10-10 DIAGNOSIS — E559 Vitamin D deficiency, unspecified: Secondary | ICD-10-CM

## 2023-10-10 DIAGNOSIS — R739 Hyperglycemia, unspecified: Secondary | ICD-10-CM

## 2023-10-10 DIAGNOSIS — I493 Ventricular premature depolarization: Secondary | ICD-10-CM

## 2023-10-10 DIAGNOSIS — Z0000 Encounter for general adult medical examination without abnormal findings: Secondary | ICD-10-CM

## 2023-10-10 DIAGNOSIS — I10 Essential (primary) hypertension: Secondary | ICD-10-CM | POA: Diagnosis not present

## 2023-10-10 DIAGNOSIS — Z0001 Encounter for general adult medical examination with abnormal findings: Secondary | ICD-10-CM | POA: Diagnosis not present

## 2023-10-10 DIAGNOSIS — Z6836 Body mass index (BMI) 36.0-36.9, adult: Secondary | ICD-10-CM

## 2023-10-10 LAB — COMPREHENSIVE METABOLIC PANEL WITH GFR
ALT: 14 U/L (ref 0–35)
AST: 17 U/L (ref 0–37)
Albumin: 3.9 g/dL (ref 3.5–5.2)
Alkaline Phosphatase: 96 U/L (ref 39–117)
BUN: 13 mg/dL (ref 6–23)
CO2: 29 meq/L (ref 19–32)
Calcium: 8.9 mg/dL (ref 8.4–10.5)
Chloride: 104 meq/L (ref 96–112)
Creatinine, Ser: 0.64 mg/dL (ref 0.40–1.20)
GFR: 91.79 mL/min (ref >60.00)
Glucose, Bld: 110 mg/dL — ABNORMAL HIGH (ref 70–99)
Potassium: 4.5 meq/L (ref 3.5–5.1)
Sodium: 141 meq/L (ref 135–145)
Total Bilirubin: 0.6 mg/dL (ref 0.2–1.2)
Total Protein: 7.3 g/dL (ref 6.0–8.3)

## 2023-10-10 LAB — HEMOGLOBIN A1C: Hgb A1c MFr Bld: 6.4 % (ref 4.6–6.5)

## 2023-10-10 LAB — LIPID PANEL
Cholesterol: 179 mg/dL (ref 0–200)
HDL: 58.5 mg/dL (ref >39.00)
LDL Cholesterol: 109 mg/dL — ABNORMAL HIGH (ref 0–99)
NonHDL: 120.84
Total CHOL/HDL Ratio: 3
Triglycerides: 57 mg/dL (ref 0.0–149.0)
VLDL: 11.4 mg/dL (ref 0.0–40.0)

## 2023-10-10 LAB — VITAMIN D 25 HYDROXY (VIT D DEFICIENCY, FRACTURES): VITD: 63.91 ng/mL (ref 30.00–100.00)

## 2023-10-10 LAB — MAGNESIUM: Magnesium: 2.3 mg/dL (ref 1.5–2.5)

## 2023-10-10 LAB — TSH: TSH: 1.54 u[IU]/mL (ref 0.35–5.50)

## 2023-10-10 NOTE — Patient Instructions (Addendum)
 Consider vaccines: Flu shot, pneumonia shot,   Tdap, COVID booster (egg free version)  Will refer you to cardiology.  Not sure if dizziness has to do with the new medications. Keep yourself hydrated     Check the  blood pressure regularly Blood pressure goal:  between 110/65 and  135/85. If it is consistently higher or lower, let me know     GO TO THE LAB :  Get the blood work   Your results will be posted on MyChart with my comments  Go to the front desk for the checkout Please make an appointment and checkup in 4 months

## 2023-10-10 NOTE — Assessment & Plan Note (Addendum)
 Here for CPX  Other issues addressed today Prediabetes: Check A1c HTN: BP looks good today, recommend to check regularly, continue amlodipine , losartan , Aldactone .  Checking labs including magnesium level (she is on supplements). PVCs: Saw cardiology last year, echo okay, Zio patch November 2024 showed PVCs frequently 31%.  Started flecainide , spironolactone , Zio patch January 2025: Rare PVCs. She is now having dizziness, on clinical grounds seems to be a peripheral issue unclear if related to initiation of flecainide  and the spironolactone .  Will refer back to cardiology. Dizziness: See above Hyperlipidemia: Diet controlled, CV risk 8.8% at 10 years.    Asthma: LOV pulmonary 07/19/2023 Vitamin D  deficiency: On supplements, recheck. OSA, mild: Not on CPAP, currently with no sxs, sleeps well, good energy. Morbid obesity: Not making progress, reassess on RTC, dietary advice provided RTC 4 months

## 2023-10-10 NOTE — Progress Notes (Signed)
 Subjective:    Patient ID: Brenda Russo, female    DOB: [birth year], 68 y.o.   MRN: [id number]  DOS:  10/10/2023 Type of visit - description: Here for CPX  CPX Chronic medical problems addressed Also, reports episodic dizziness, not frequent, started approximately December 2024.  Symptoms are described as spinning or vertigo, triggered after she gets up, may last up to 15 minutes. No associated LOC, chest pain, palpitations. No strokelike symptoms.    Wt Readings from Last 3 Encounters:  10/10/23 222 lb (100.7 kg)  07/19/23 223 lb 9.6 oz (101.4 kg)  07/06/23 218 lb (98.9 kg)    Review of Systems  Other than above, a 14 point review of systems is negative    Past Medical History:  Diagnosis Date   Allergic rhinitis    Allergy    Anemia    Anxiety    Asthma    HTN (hypertension)    Hyperlipidemia    borderline   Obese    OSA (obstructive sleep apnea)    mild ,no CPAP   Overweight(278.02)    Plantar wart    history   Prediabetes    Sleep apnea     Past Surgical History:  Procedure Laterality Date   BREAST BIOPSY Right 09/02/2015   CESAREAN SECTION     COLONOSCOPY  11/05/2018   COLONOSCOPY  2010   TUBAL LIGATION      Current Outpatient Medications  Medication Instructions   albuterol  (VENTOLIN  HFA) 108 (90 Base) MCG/ACT inhaler Inhale 2 puffs by mouth into the lungs every 4 (four) hours as needed for wheezing or shortness of breath.   amLODipine  (NORVASC ) 5 mg, Oral, Daily   Ergocalciferol  5,000 Units, Oral, Daily   flecainide  (TAMBOCOR ) 50 mg, Oral, 2 times daily, Appointment Required For Further Refills [phone]   fluticasone  (FLONASE ) 50 MCG/ACT nasal spray 2 sprays, Daily PRN   fluticasone  furoate-vilanterol (BREO ELLIPTA ) 100-25 MCG/ACT AEPB USE 1 INHALATION BY MOUTH ONCE  DAILY AT THE SAME TIME EACH DAY  EVERY MORNING   losartan  (COZAAR ) 100 mg, Oral, Daily   Magnesium 200 MG TABS Take by mouth.   Multiple Minerals-Vitamins (CALCIUM CITRATE  PLUS/MAGNESIUM) TABS 1 tablet   Omega-3 Fatty Acids (FISH OIL) 1000 MG CAPS 1 capsule   spironolactone  (ALDACTONE ) 25 mg, Oral, Daily, Appointment Required For Further Refills [phone]       Objective:   Physical Exam BP 126/80   Pulse 62   Temp 97.6 F (36.4 C) (Oral)   Resp 16   Ht 5' 5 (1.651 m)   Wt 222 lb (100.7 kg)   SpO2 96%   BMI 36.94 kg/m  General: Well developed, NAD, BMI noted Neck: No  thyromegaly  HEENT:  Normocephalic . Face symmetric, atraumatic Neck: Normal carotid pulses Lungs:  CTA B Normal respiratory effort, no intercostal retractions, no accessory muscle use. Heart: RRR,  no murmur.  Abdomen:  Not distended, soft, non-tender. No rebound or rigidity.   Lower extremities: no pretibial edema bilaterally  Skin: Exposed areas without rash. Not pale. Not jaundice Neurologic:  alert & oriented X3.  Speech normal, gait appropriate for age and unassisted Strength symmetric and appropriate for age.  Psych: Cognition and judgment appear intact.  Cooperative with normal attention span and concentration.  Behavior appropriate. No anxious or depressed appearing.     Assessment    Assessment Prediabetes A1c 6.0 (05/2014) HTN Sinus bradycardia-PVCs    Hyperlipidemia Morbid obesity (BMI, OSA, HTN, prediabetes) Asthma - h/o  intubation x2 in the 90s  OSA-mild, dx 2014 , never tried CPAP BTL LLQ abd pain on-off , last GI visit 11-2015, CT abd (-) H/o anxiety  PLAN: Here for CPX - declines vaccines , see previous entries  --Female care: Saw gynecology 06/2023.  Pap: 06/2023.  MMG 11-2022 --CCS--08/19/08- normal, C-scope 10/2018, polyps, next 7 hears per GI letter (Dr Teressa) --dexa 07-2015 WNL ,  DEXA rx by PCP--11-2020 T score (-) 1.1 , next 2027 --diet and exercise: Not as active as before, not playing pickle ball, encouraged daily walk and a low carbohydrate diet. --labs: CMP FLP A1c TSH -POA information provided   Other issues addressed  today Prediabetes: Check A1c HTN: BP looks good today, recommend to check regularly, continue amlodipine , losartan , Aldactone .  Checking labs including magnesium level (she is on supplements). PVCs: Saw cardiology last year, echo okay, Zio patch November 2024 showed PVCs frequently 31%.  Started flecainide , spironolactone , Zio patch January 2025: Rare PVCs. She is now having dizziness, on clinical grounds seems to be a peripheral issue unclear if related to initiation of flecainide  and the spironolactone .  Will refer back to cardiology. Dizziness: See above Hyperlipidemia: Diet controlled, CV risk 8.8% at 10 years.    Asthma: LOV pulmonary 07/19/2023 Vitamin D  deficiency: On supplements, recheck. OSA, mild: Not on CPAP, currently with no sxs, sleeps well, good energy. Morbid obesity: Not making progress, reassess on RTC, dietary advice provided RTC 4 months

## 2023-10-10 NOTE — Assessment & Plan Note (Signed)
 Here for CPX - declines vaccines , see previous entries  --Female care: Saw gynecology 06/2023.  Pap: 06/2023.  MMG 11-2022 --CCS--08/19/08- normal, C-scope 10/2018, polyps, next 7 hears per GI letter (Dr Teressa) --dexa 07-2015 WNL ,  DEXA rx by PCP--11-2020 T score (-) 1.1 , next 2027 --diet and exercise: Not as active as before, not playing pickle ball, encouraged daily walk and a low carbohydrate diet. --labs: CMP FLP A1c TSH -POA information provided

## 2023-10-11 ENCOUNTER — Ambulatory Visit: Payer: Self-pay | Admitting: Internal Medicine

## 2023-10-12 ENCOUNTER — Encounter: Payer: Self-pay | Admitting: Internal Medicine

## 2023-11-01 ENCOUNTER — Ambulatory Visit: Attending: Cardiology | Admitting: Cardiovascular Disease

## 2023-11-01 ENCOUNTER — Encounter: Payer: Self-pay | Admitting: Cardiovascular Disease

## 2023-11-01 VITALS — BP 154/88 | HR 62 | Ht 65.0 in | Wt 220.0 lb

## 2023-11-01 DIAGNOSIS — R001 Bradycardia, unspecified: Secondary | ICD-10-CM | POA: Diagnosis not present

## 2023-11-01 DIAGNOSIS — I493 Ventricular premature depolarization: Secondary | ICD-10-CM

## 2023-11-01 NOTE — Progress Notes (Signed)
 Electrophysiology Office Note:    Date:  11/01/2023   ID:  Deziree, Mokry 1955-04-26, MRN [id number]  PCP:  Amon Aloysius BRAVO, MD   [facility] Providers Cardiologist:  None Electrophysiologist:  Elspeth Sage, MD     Referring MD: Amon Aloysius BRAVO, MD   History of Present Illness:    CHANEY INGRAM is a 68 y.o. female with a medical history significant for PVCs with pseudobradycardia sinus bradycardia, who presents for EP follow-up.     History of Present Illness  She has a history of PVCs with a Zio patch in November 2024 showing a burden of 31%.  Flecainide  was started and subsequent monitor in January 2025 showed a PVC burden of less than 1%.  She has a history of vertigo symptoms, but these appear to get worse after starting flecainide .  They were particularly bad in the initial period after she started flecainide  but have been getting better recently.    She did pause her flecainide  for about 2 days which did result improvement of her dizziness, but her palpitations returned, so she resumed the flecainide .   She does note that on flecainide  her symptoms of palpitations are substantially better and she is reluctant to make any changes.  She has not had any syncope, presyncope, loss of balance or threaten fall.        Today, she reports that she feels well and is at baseline.  EKGs/Labs/Other Studies Reviewed Today:     Echocardiogram:  TTE September 2024 LVEF 50%.  Grade 1 diastolic dysfunction.  Mildly dilated left atrium.   Monitors:  5 day monitor January 2025-- my interpretation Sinus rhythm, heart rate 44 to 108 bpm, average 70 bpm PVC burden less than 1%  Exercise tolerance test December 2024 showed no flecainide  related QRS or PR changes.  EKG:   EKG Interpretation Date/Time:  Wednesday November 01 2023 15:56:01 EDT Ventricular Rate:  62 PR Interval:  188 QRS Duration:  82 QT Interval:  452 QTC Calculation: 458 R Axis:   59  Text  Interpretation: Normal sinus rhythm Normal ECG When compared with ECG of 19-Oct-2022 08:51, Questionable change in QRS axis Confirmed by Nancey Scotts [phone]) on 11/01/2023 3:58:14 PM     Physical Exam:    VS:  BP (!) 154/88 (BP Location: Left Arm, Patient Position: Sitting, Cuff Size: Large)   Pulse 62   Ht 5' 5 (1.651 m)   Wt 220 lb (99.8 kg)   SpO2 98%   BMI 36.61 kg/m     Wt Readings from Last 3 Encounters:  11/01/23 220 lb (99.8 kg)  10/10/23 222 lb (100.7 kg)  07/19/23 223 lb 9.6 oz (101.4 kg)     GEN: Well nourished, well developed in no acute distress CARDIAC: RRR, no murmurs, rubs, gallops RESPIRATORY:  Normal work of breathing MUSCULOSKELETAL: no edema    ASSESSMENT & PLAN:     History of PVCs Most recent monitor, January 2025, showed burden less than 1% on flecainide  50mg  PO BID EF is normal  Dizziness Vertigo elicited with transition to sitting in upright position, exacerbated with head angled to the right I suspect flecainide  is contributing to, if not causing, her dizziness I recommended flecainide  washout and then trying mexiletine She is reluctant to make any changes right now, and she is leaving town in a few days I will have her follow-up with an APP in about 2 months to see how she is doing If she continues to  have vertigo, we will have her discontinue flecainide  and start mexiletine and after an appropriate washout period.  High risk medication - flecainide   ECG, stress test, labs reviewed Continue flecainide      Signed, Eulas FORBES Furbish, MD  11/01/2023 8:55 PM    [facility]

## 2023-11-01 NOTE — Patient Instructions (Signed)
 Medication Instructions:  Your physician recommends that you continue on your current medications as directed. Please refer to the Current Medication list given to you today.  *If you need a refill on your cardiac medications before your next appointment, please call your pharmacy*  Lab Work: None ordered.  If you have labs (blood work) drawn today and your tests are completely normal, you will receive your results only by: MyChart Message (if you have MyChart) OR A paper copy in the mail If you have any lab test that is abnormal or we need to change your treatment, we will call you to review the results.  Testing/Procedures: None ordered.   Follow-Up: At [facility], you and your health needs are our priority.  As part of our continuing mission to provide you with exceptional heart care, our providers are all part of one team.  This team includes your primary Cardiologist (physician) and Advanced Practice Providers or APPs (Physician Assistants and Nurse Practitioners) who all work together to provide you with the care you need, when you need it.  Your next appointment:   8 weeks with Charlies Arthur, PA-C or Daphne Barrack, NP

## 2023-11-08 ENCOUNTER — Other Ambulatory Visit: Payer: Self-pay | Admitting: Internal Medicine

## 2023-11-15 ENCOUNTER — Other Ambulatory Visit (HOSPITAL_BASED_OUTPATIENT_CLINIC_OR_DEPARTMENT_OTHER)

## 2023-11-15 ENCOUNTER — Ambulatory Visit (HOSPITAL_BASED_OUTPATIENT_CLINIC_OR_DEPARTMENT_OTHER)

## 2023-11-16 ENCOUNTER — Ambulatory Visit (HOSPITAL_BASED_OUTPATIENT_CLINIC_OR_DEPARTMENT_OTHER)

## 2023-11-21 ENCOUNTER — Other Ambulatory Visit (HOSPITAL_BASED_OUTPATIENT_CLINIC_OR_DEPARTMENT_OTHER): Payer: Self-pay | Admitting: Internal Medicine

## 2023-11-21 DIAGNOSIS — Z1231 Encounter for screening mammogram for malignant neoplasm of breast: Secondary | ICD-10-CM

## 2023-11-21 DIAGNOSIS — M8580 Other specified disorders of bone density and structure, unspecified site: Secondary | ICD-10-CM

## 2023-11-21 DIAGNOSIS — Z01419 Encounter for gynecological examination (general) (routine) without abnormal findings: Secondary | ICD-10-CM

## 2023-11-22 ENCOUNTER — Ambulatory Visit (HOSPITAL_BASED_OUTPATIENT_CLINIC_OR_DEPARTMENT_OTHER)

## 2023-11-22 ENCOUNTER — Other Ambulatory Visit (HOSPITAL_BASED_OUTPATIENT_CLINIC_OR_DEPARTMENT_OTHER)

## 2023-11-23 ENCOUNTER — Ambulatory Visit (HOSPITAL_BASED_OUTPATIENT_CLINIC_OR_DEPARTMENT_OTHER)
Admission: RE | Admit: 2023-11-23 | Discharge: 2023-11-23 | Disposition: A | Discharge status: Home / Self Care | Source: Ambulatory Visit | Attending: Obstetrics & Gynecology | Admitting: Obstetrics & Gynecology

## 2023-11-23 ENCOUNTER — Encounter (HOSPITAL_BASED_OUTPATIENT_CLINIC_OR_DEPARTMENT_OTHER): Payer: Self-pay

## 2023-11-23 DIAGNOSIS — Z1231 Encounter for screening mammogram for malignant neoplasm of breast: Secondary | ICD-10-CM | POA: Diagnosis not present

## 2023-11-23 DIAGNOSIS — Z780 Asymptomatic menopausal state: Secondary | ICD-10-CM | POA: Diagnosis not present

## 2023-11-23 DIAGNOSIS — M8580 Other specified disorders of bone density and structure, unspecified site: Secondary | ICD-10-CM | POA: Diagnosis not present

## 2023-12-11 ENCOUNTER — Encounter: Payer: Self-pay | Admitting: Radiology

## 2023-12-13 ENCOUNTER — Other Ambulatory Visit: Payer: Self-pay | Admitting: Internal Medicine

## 2023-12-13 ENCOUNTER — Other Ambulatory Visit: Payer: Self-pay | Admitting: Student

## 2023-12-14 MED ORDER — SPIRONOLACTONE 25 MG PO TABS: 25.0000 mg | ORAL_TABLET | Freq: Every day | ORAL | 3 refills | Status: AC

## 2023-12-21 NOTE — Progress Notes (Signed)
  Electrophysiology Office Note:   Date:  12/27/2023  ID:  Brenda Russo, DOB 1955/11/26, MRN [id number]  Primary Cardiologist: None Primary Heart Failure: None Electrophysiologist: Brenda FORBES Furbish, MD      History of Present Illness:   Brenda Russo is a 68 y.o. 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   Seen in EP Clinic 11/01/23 and noted dizziness. Dizziness was thought to be related to flecainide  and she was recommended to make a medication change to mexiletine.  However, she did not want to make a change as she was about to go out of town.   Since last being seen in our clinic the patient reports doing well overall. She reports her dizziness has stopped in the last 2 weeks. She has a hx of vertigo and it felt similar to her past experiences with vertigo. No issues with flecainide  otherwise. She states she missed a dose and could feel her PVC's and it reminded her to take her meds. She reports no awareness of PVC's since being on flecainide .   She denies chest pain, palpitations, dyspnea, PND, orthopnea, nausea, vomiting, dizziness, syncope, edema, weight gain, or early satiety.   Review of systems complete and found to be negative unless listed in HPI.   EP Information / Studies Reviewed:    EKG is ordered today. Personal review as below.  EKG Interpretation Date/Time:  Wednesday December 27 2023 13:50:33 EST Ventricular Rate:  66 PR Interval:  176 QRS Duration:  78 QT Interval:  436 QTC Calculation: 457 R Axis:   -10  Text Interpretation: Normal sinus rhythm Normal ECG Confirmed by Aniceto Jarvis ([id number]) on 12/27/2023 2:05:59 PM    Arrhythmia / AAD / Pertinent EP Studies PVC's  Cardiac Monitor 02/2023 > PVC burden <1% Flecainide  01/2023 >   Risk Assessment/Calculations:              Physical Exam:   VS:  BP 126/80 (BP Location: Right Arm, Patient Position: Sitting, Cuff Size:  Large)   Pulse 66   Ht 5' 6 (1.676 m)   Wt 225 lb (102.1 kg)   SpO2 97%   BMI 36.32 kg/m    Wt Readings from Last 3 Encounters:  12/27/23 225 lb (102.1 kg)  11/01/23 220 lb (99.8 kg)  10/10/23 222 lb (100.7 kg)     GEN: Well nourished, well developed in no acute distress NECK: No JVD; No carotid bruits CARDIAC: Regular rate and rhythm, no murmurs, rubs, gallops RESPIRATORY:  Clear to auscultation without rales, wheezing or rhonchi  ABDOMEN: Soft, non-tender, non-distended EXTREMITIES:  No edema; No deformity   ASSESSMENT AND PLAN:    PVC's  High Risk Medication: Flecainide   LVEF 50% 10/2022, stress test on flecainide  without changes  -EKG with NSR, stable intervals on flecainide   -continue flecainide  50 mg BID, dizziness resolved  -if dizziness returns, could consider mexiletine after flecainide  wash out. We also discussed beta blocker but her low resting HR may be prohibitive.   -magnesium supplementation  -consider repeat ECHO at next visit to review on flecainide  / she had a mildly reduced LVEF thought related to PVC burden. Hopeful with control, it will normalize  Sinus Bradycardia  -asymptomatic   Follow up with Dr. Furbish / EP APP  4 months   Signed, Jarvis Aniceto, NP-C, AGACNP-BC [facility] - Electrophysiology  12/27/2023, 2:06 PM

## 2023-12-27 ENCOUNTER — Ambulatory Visit: Attending: Pulmonary Disease | Admitting: Pulmonary Disease

## 2023-12-27 ENCOUNTER — Encounter: Payer: Self-pay | Admitting: Pulmonary Disease

## 2023-12-27 VITALS — BP 126/80 | HR 66 | Ht 66.0 in | Wt 225.0 lb

## 2023-12-27 DIAGNOSIS — R001 Bradycardia, unspecified: Secondary | ICD-10-CM | POA: Diagnosis not present

## 2023-12-27 DIAGNOSIS — I493 Ventricular premature depolarization: Secondary | ICD-10-CM

## 2023-12-27 DIAGNOSIS — Z79899 Other long term (current) drug therapy: Secondary | ICD-10-CM | POA: Diagnosis not present

## 2023-12-27 NOTE — Patient Instructions (Signed)
 Medication Instructions:  Your physician recommends that you continue on your current medications as directed. Please refer to the Current Medication list given to you today.  *If you need a refill on your cardiac medications before your next appointment, please call your pharmacy*  Lab Work: None ordered If you have labs (blood work) drawn today and your tests are completely normal, you will receive your results only by: MyChart Message (if you have MyChart) OR A paper copy in the mail If you have any lab test that is abnormal or we need to change your treatment, we will call you to review the results.  Follow-Up: At [facility], you and your health needs are our priority.  As part of our continuing mission to provide you with exceptional heart care, our providers are all part of one team.  This team includes your primary Cardiologist (physician) and Advanced Practice Providers or APPs (Physician Assistants and Nurse Practitioners) who all work together to provide you with the care you need, when you need it.  Your next appointment:   4 month(s)  Provider:   Creighton Doffing, NP

## 2024-02-12 ENCOUNTER — Ambulatory Visit: Admitting: Internal Medicine

## 2024-03-29 ENCOUNTER — Ambulatory Visit: Admitting: Internal Medicine

## 2024-04-15 ENCOUNTER — Ambulatory Visit: Admitting: Pulmonary Disease

## 2024-07-10 ENCOUNTER — Ambulatory Visit
